# Patient Record
Sex: Female | Born: 1935 | Race: White | Hispanic: No | Smoking: Never smoker
Health system: Southern US, Community
[De-identification: ages and names within clinical notes are randomized; demographics above are authoritative.]

## PROBLEM LIST (undated history)

## (undated) DIAGNOSIS — IMO0002 Reserved for concepts with insufficient information to code with codable children: Secondary | ICD-10-CM

## (undated) DIAGNOSIS — I671 Cerebral aneurysm, nonruptured: Secondary | ICD-10-CM

## (undated) DIAGNOSIS — J189 Pneumonia, unspecified organism: Secondary | ICD-10-CM

## (undated) DIAGNOSIS — Z8614 Personal history of Methicillin resistant Staphylococcus aureus infection: Secondary | ICD-10-CM

## (undated) DIAGNOSIS — I639 Cerebral infarction, unspecified: Secondary | ICD-10-CM

## (undated) DIAGNOSIS — E785 Hyperlipidemia, unspecified: Secondary | ICD-10-CM

## (undated) DIAGNOSIS — C7911 Secondary malignant neoplasm of bladder: Secondary | ICD-10-CM

## (undated) DIAGNOSIS — I10 Essential (primary) hypertension: Secondary | ICD-10-CM

## (undated) DIAGNOSIS — C50919 Malignant neoplasm of unspecified site of unspecified female breast: Secondary | ICD-10-CM

## (undated) DIAGNOSIS — R339 Retention of urine, unspecified: Secondary | ICD-10-CM

## (undated) DIAGNOSIS — R51 Headache: Secondary | ICD-10-CM

## (undated) DIAGNOSIS — T8859XA Other complications of anesthesia, initial encounter: Secondary | ICD-10-CM

## (undated) DIAGNOSIS — C679 Malignant neoplasm of bladder, unspecified: Secondary | ICD-10-CM

## (undated) DIAGNOSIS — R519 Headache, unspecified: Secondary | ICD-10-CM

## (undated) DIAGNOSIS — R351 Nocturia: Secondary | ICD-10-CM

## (undated) DIAGNOSIS — M5030 Other cervical disc degeneration, unspecified cervical region: Secondary | ICD-10-CM

## (undated) DIAGNOSIS — M1990 Unspecified osteoarthritis, unspecified site: Secondary | ICD-10-CM

## (undated) DIAGNOSIS — N949 Unspecified condition associated with female genital organs and menstrual cycle: Secondary | ICD-10-CM

## (undated) DIAGNOSIS — K219 Gastro-esophageal reflux disease without esophagitis: Secondary | ICD-10-CM

## (undated) DIAGNOSIS — E039 Hypothyroidism, unspecified: Secondary | ICD-10-CM

## (undated) DIAGNOSIS — M4800 Spinal stenosis, site unspecified: Secondary | ICD-10-CM

## (undated) DIAGNOSIS — E079 Disorder of thyroid, unspecified: Secondary | ICD-10-CM

## (undated) DIAGNOSIS — Z8719 Personal history of other diseases of the digestive system: Secondary | ICD-10-CM

## (undated) DIAGNOSIS — N189 Chronic kidney disease, unspecified: Secondary | ICD-10-CM

## (undated) DIAGNOSIS — N3941 Urge incontinence: Secondary | ICD-10-CM

## (undated) DIAGNOSIS — Z8673 Personal history of transient ischemic attack (TIA), and cerebral infarction without residual deficits: Secondary | ICD-10-CM

## (undated) DIAGNOSIS — T4145XA Adverse effect of unspecified anesthetic, initial encounter: Secondary | ICD-10-CM

## (undated) DIAGNOSIS — I679 Cerebrovascular disease, unspecified: Secondary | ICD-10-CM

## (undated) DIAGNOSIS — A4902 Methicillin resistant Staphylococcus aureus infection, unspecified site: Secondary | ICD-10-CM

## (undated) HISTORY — DX: Unspecified condition associated with female genital organs and menstrual cycle: N94.9

## (undated) HISTORY — DX: Cerebrovascular disease, unspecified: I67.9

## (undated) HISTORY — PX: FRACTURE SURGERY: SHX138

## (undated) HISTORY — PX: ABDOMINAL HYSTERECTOMY: SHX81

## (undated) HISTORY — PX: CHOLECYSTECTOMY: SHX55

## (undated) HISTORY — DX: Hyperlipidemia, unspecified: E78.5

## (undated) HISTORY — DX: Methicillin resistant Staphylococcus aureus infection, unspecified site: A49.02

## (undated) HISTORY — PX: LAPAROSCOPIC HYSTERECTOMY: SHX1926

## (undated) HISTORY — DX: Nocturia: R35.1

## (undated) HISTORY — DX: Spinal stenosis, site unspecified: M48.00

## (undated) HISTORY — DX: Cerebral aneurysm, nonruptured: I67.1

## (undated) HISTORY — DX: Personal history of Methicillin resistant Staphylococcus aureus infection: Z86.14

## (undated) HISTORY — DX: Unspecified osteoarthritis, unspecified site: M19.90

## (undated) HISTORY — DX: Secondary malignant neoplasm of bladder: C79.11

## (undated) HISTORY — PX: CATARACT EXTRACTION: SUR2

## (undated) HISTORY — PX: BLADDER SURGERY: SHX569

## (undated) HISTORY — PX: TONSILLECTOMY: SUR1361

## (undated) HISTORY — DX: Disorder of thyroid, unspecified: E07.9

## (undated) HISTORY — DX: Retention of urine, unspecified: R33.9

## (undated) HISTORY — PX: BACK SURGERY: SHX140

## (undated) HISTORY — PX: EYE SURGERY: SHX253

## (undated) HISTORY — DX: Urge incontinence: N39.41

## (undated) HISTORY — DX: Essential (primary) hypertension: I10

## (undated) HISTORY — DX: Cerebral infarction, unspecified: I63.9

## (undated) HISTORY — DX: Gastro-esophageal reflux disease without esophagitis: K21.9

## (undated) HISTORY — DX: Malignant neoplasm of bladder, unspecified: C67.9

## (undated) HISTORY — DX: Other cervical disc degeneration, unspecified cervical region: M50.30

---

## 1997-11-22 ENCOUNTER — Ambulatory Visit (HOSPITAL_COMMUNITY): Admission: RE | Admit: 1997-11-22 | Discharge: 1997-11-22 | Payer: Self-pay | Admitting: Gastroenterology

## 2000-04-06 ENCOUNTER — Ambulatory Visit (HOSPITAL_COMMUNITY): Admission: RE | Admit: 2000-04-06 | Discharge: 2000-04-06 | Payer: Self-pay | Admitting: Gastroenterology

## 2004-01-28 ENCOUNTER — Inpatient Hospital Stay (HOSPITAL_COMMUNITY): Admission: RE | Admit: 2004-01-28 | Discharge: 2004-01-29 | Payer: Self-pay | Admitting: Neurosurgery

## 2004-04-02 ENCOUNTER — Ambulatory Visit (HOSPITAL_COMMUNITY): Admission: RE | Admit: 2004-04-02 | Discharge: 2004-04-02 | Payer: Self-pay | Admitting: Gastroenterology

## 2004-04-02 ENCOUNTER — Encounter (INDEPENDENT_AMBULATORY_CARE_PROVIDER_SITE_OTHER): Payer: Self-pay | Admitting: *Deleted

## 2004-06-23 ENCOUNTER — Ambulatory Visit: Payer: Self-pay | Admitting: Pain Medicine

## 2005-02-23 ENCOUNTER — Ambulatory Visit: Payer: Self-pay | Admitting: Pain Medicine

## 2005-02-24 ENCOUNTER — Ambulatory Visit: Payer: Self-pay | Admitting: Pain Medicine

## 2005-04-16 ENCOUNTER — Ambulatory Visit: Payer: Self-pay | Admitting: Pain Medicine

## 2005-04-27 ENCOUNTER — Ambulatory Visit: Payer: Self-pay | Admitting: Pain Medicine

## 2005-05-05 ENCOUNTER — Ambulatory Visit: Payer: Self-pay | Admitting: Pain Medicine

## 2005-05-31 DIAGNOSIS — IMO0001 Reserved for inherently not codable concepts without codable children: Secondary | ICD-10-CM

## 2005-05-31 DIAGNOSIS — C50919 Malignant neoplasm of unspecified site of unspecified female breast: Secondary | ICD-10-CM

## 2005-05-31 HISTORY — DX: Malignant neoplasm of unspecified site of unspecified female breast: C50.919

## 2005-05-31 HISTORY — PX: BREAST LUMPECTOMY: SHX2

## 2005-05-31 HISTORY — DX: Reserved for inherently not codable concepts without codable children: IMO0001

## 2005-06-03 ENCOUNTER — Ambulatory Visit: Payer: Self-pay

## 2005-06-24 ENCOUNTER — Ambulatory Visit: Payer: Self-pay | Admitting: Pain Medicine

## 2005-07-07 ENCOUNTER — Ambulatory Visit: Payer: Self-pay | Admitting: Pain Medicine

## 2005-08-03 ENCOUNTER — Ambulatory Visit: Payer: Self-pay | Admitting: Pain Medicine

## 2005-08-11 ENCOUNTER — Ambulatory Visit: Payer: Self-pay | Admitting: Pain Medicine

## 2005-09-16 ENCOUNTER — Ambulatory Visit: Payer: Self-pay | Admitting: Pain Medicine

## 2005-09-29 ENCOUNTER — Ambulatory Visit: Payer: Self-pay | Admitting: Pain Medicine

## 2005-10-15 ENCOUNTER — Other Ambulatory Visit: Payer: Self-pay

## 2005-10-15 ENCOUNTER — Ambulatory Visit: Payer: Self-pay | Admitting: Unknown Physician Specialty

## 2005-10-21 ENCOUNTER — Ambulatory Visit: Payer: Self-pay | Admitting: Unknown Physician Specialty

## 2005-12-08 ENCOUNTER — Ambulatory Visit: Payer: Self-pay | Admitting: Pain Medicine

## 2006-01-05 ENCOUNTER — Ambulatory Visit: Payer: Self-pay | Admitting: Pain Medicine

## 2006-01-17 ENCOUNTER — Ambulatory Visit: Payer: Self-pay | Admitting: Pain Medicine

## 2006-02-10 ENCOUNTER — Ambulatory Visit: Payer: Self-pay | Admitting: Family Medicine

## 2006-02-18 ENCOUNTER — Ambulatory Visit: Payer: Self-pay | Admitting: Family Medicine

## 2006-02-24 ENCOUNTER — Ambulatory Visit: Payer: Self-pay | Admitting: Unknown Physician Specialty

## 2006-03-24 ENCOUNTER — Ambulatory Visit: Payer: Self-pay | Admitting: General Surgery

## 2006-03-30 ENCOUNTER — Ambulatory Visit: Payer: Self-pay | Admitting: General Surgery

## 2006-04-05 ENCOUNTER — Ambulatory Visit: Payer: Self-pay | Admitting: Pain Medicine

## 2006-04-11 ENCOUNTER — Ambulatory Visit: Payer: Self-pay | Admitting: Radiation Oncology

## 2006-04-25 ENCOUNTER — Inpatient Hospital Stay: Payer: Self-pay | Admitting: General Surgery

## 2006-05-10 ENCOUNTER — Ambulatory Visit: Payer: Self-pay | Admitting: Radiation Oncology

## 2006-05-19 ENCOUNTER — Ambulatory Visit: Payer: Self-pay | Admitting: Pain Medicine

## 2006-05-31 ENCOUNTER — Ambulatory Visit: Payer: Self-pay | Admitting: Radiation Oncology

## 2006-05-31 DIAGNOSIS — A4902 Methicillin resistant Staphylococcus aureus infection, unspecified site: Secondary | ICD-10-CM

## 2006-05-31 DIAGNOSIS — I679 Cerebrovascular disease, unspecified: Secondary | ICD-10-CM

## 2006-05-31 HISTORY — PX: BRAIN SURGERY: SHX531

## 2006-05-31 HISTORY — DX: Methicillin resistant Staphylococcus aureus infection, unspecified site: A49.02

## 2006-05-31 HISTORY — DX: Cerebrovascular disease, unspecified: I67.9

## 2006-06-08 ENCOUNTER — Ambulatory Visit: Payer: Self-pay | Admitting: Pain Medicine

## 2006-07-01 ENCOUNTER — Ambulatory Visit: Payer: Self-pay | Admitting: Radiation Oncology

## 2006-07-12 ENCOUNTER — Ambulatory Visit: Payer: Self-pay | Admitting: Pain Medicine

## 2006-07-20 ENCOUNTER — Ambulatory Visit: Payer: Self-pay | Admitting: Pain Medicine

## 2006-07-30 ENCOUNTER — Ambulatory Visit: Payer: Self-pay | Admitting: Radiation Oncology

## 2006-08-17 ENCOUNTER — Ambulatory Visit: Payer: Self-pay | Admitting: Radiation Oncology

## 2006-08-30 ENCOUNTER — Ambulatory Visit: Payer: Self-pay | Admitting: Radiation Oncology

## 2006-09-06 ENCOUNTER — Ambulatory Visit: Payer: Self-pay | Admitting: Pain Medicine

## 2006-09-14 ENCOUNTER — Ambulatory Visit: Payer: Self-pay | Admitting: Pain Medicine

## 2006-10-04 ENCOUNTER — Ambulatory Visit: Payer: Self-pay | Admitting: Pain Medicine

## 2006-10-10 ENCOUNTER — Ambulatory Visit: Payer: Self-pay | Admitting: Pain Medicine

## 2006-10-31 ENCOUNTER — Ambulatory Visit: Payer: Self-pay | Admitting: General Surgery

## 2006-11-01 ENCOUNTER — Ambulatory Visit: Payer: Self-pay | Admitting: Pain Medicine

## 2006-12-08 ENCOUNTER — Ambulatory Visit: Payer: Self-pay | Admitting: Pain Medicine

## 2006-12-10 ENCOUNTER — Ambulatory Visit: Payer: Self-pay | Admitting: Specialist

## 2006-12-20 ENCOUNTER — Encounter: Payer: Self-pay | Admitting: Specialist

## 2006-12-21 ENCOUNTER — Ambulatory Visit: Payer: Self-pay | Admitting: Pain Medicine

## 2006-12-30 ENCOUNTER — Ambulatory Visit: Payer: Self-pay | Admitting: Radiation Oncology

## 2007-01-23 ENCOUNTER — Ambulatory Visit: Payer: Self-pay | Admitting: Radiation Oncology

## 2007-01-24 ENCOUNTER — Ambulatory Visit: Payer: Self-pay | Admitting: Pain Medicine

## 2007-01-30 ENCOUNTER — Ambulatory Visit: Payer: Self-pay | Admitting: Internal Medicine

## 2007-02-02 ENCOUNTER — Inpatient Hospital Stay (HOSPITAL_COMMUNITY): Admission: RE | Admit: 2007-02-02 | Discharge: 2007-02-03 | Payer: Self-pay | Admitting: Interventional Radiology

## 2007-02-16 ENCOUNTER — Encounter: Payer: Self-pay | Admitting: Interventional Radiology

## 2007-02-24 ENCOUNTER — Ambulatory Visit: Payer: Self-pay | Admitting: Internal Medicine

## 2007-03-13 ENCOUNTER — Ambulatory Visit: Payer: Self-pay | Admitting: Pain Medicine

## 2007-04-10 ENCOUNTER — Ambulatory Visit: Payer: Self-pay | Admitting: Internal Medicine

## 2007-04-11 ENCOUNTER — Ambulatory Visit: Payer: Self-pay | Admitting: Pain Medicine

## 2007-04-12 ENCOUNTER — Ambulatory Visit: Payer: Self-pay | Admitting: Internal Medicine

## 2007-04-12 ENCOUNTER — Other Ambulatory Visit: Payer: Self-pay

## 2007-04-13 ENCOUNTER — Ambulatory Visit: Payer: Self-pay | Admitting: Otolaryngology

## 2007-05-03 ENCOUNTER — Ambulatory Visit: Payer: Self-pay | Admitting: General Surgery

## 2007-06-07 ENCOUNTER — Ambulatory Visit (HOSPITAL_COMMUNITY): Admission: RE | Admit: 2007-06-07 | Discharge: 2007-06-07 | Payer: Self-pay | Admitting: Interventional Radiology

## 2007-06-26 ENCOUNTER — Ambulatory Visit: Payer: Self-pay | Admitting: Radiation Oncology

## 2007-08-09 ENCOUNTER — Encounter: Payer: Self-pay | Admitting: Interventional Radiology

## 2007-10-30 ENCOUNTER — Ambulatory Visit: Payer: Self-pay | Admitting: Radiation Oncology

## 2007-11-08 ENCOUNTER — Ambulatory Visit: Payer: Self-pay | Admitting: General Surgery

## 2007-12-19 ENCOUNTER — Ambulatory Visit (HOSPITAL_COMMUNITY): Admission: RE | Admit: 2007-12-19 | Discharge: 2007-12-19 | Payer: Self-pay | Admitting: Interventional Radiology

## 2008-01-11 ENCOUNTER — Ambulatory Visit: Payer: Self-pay | Admitting: Pain Medicine

## 2008-01-17 ENCOUNTER — Ambulatory Visit: Payer: Self-pay | Admitting: Pain Medicine

## 2008-02-08 ENCOUNTER — Ambulatory Visit: Payer: Self-pay | Admitting: Pain Medicine

## 2008-02-19 ENCOUNTER — Ambulatory Visit: Payer: Self-pay | Admitting: Pain Medicine

## 2008-03-11 ENCOUNTER — Ambulatory Visit: Payer: Self-pay | Admitting: Pain Medicine

## 2008-04-08 ENCOUNTER — Ambulatory Visit: Payer: Self-pay | Admitting: General Surgery

## 2008-04-09 ENCOUNTER — Ambulatory Visit: Payer: Self-pay | Admitting: Pain Medicine

## 2008-06-20 ENCOUNTER — Ambulatory Visit: Payer: Self-pay | Admitting: Pain Medicine

## 2008-06-24 ENCOUNTER — Ambulatory Visit: Payer: Self-pay | Admitting: Pain Medicine

## 2008-07-25 ENCOUNTER — Ambulatory Visit: Payer: Self-pay | Admitting: Pain Medicine

## 2008-08-22 ENCOUNTER — Ambulatory Visit: Payer: Self-pay | Admitting: Pain Medicine

## 2008-09-16 ENCOUNTER — Ambulatory Visit (HOSPITAL_COMMUNITY): Admission: RE | Admit: 2008-09-16 | Discharge: 2008-09-16 | Payer: Self-pay | Admitting: Interventional Radiology

## 2008-09-23 ENCOUNTER — Ambulatory Visit: Payer: Self-pay | Admitting: Pain Medicine

## 2008-09-25 ENCOUNTER — Ambulatory Visit (HOSPITAL_COMMUNITY): Admission: RE | Admit: 2008-09-25 | Discharge: 2008-09-25 | Payer: Self-pay | Admitting: Interventional Radiology

## 2008-10-22 ENCOUNTER — Ambulatory Visit: Payer: Self-pay | Admitting: Pain Medicine

## 2008-11-06 ENCOUNTER — Ambulatory Visit: Payer: Self-pay

## 2008-11-13 ENCOUNTER — Ambulatory Visit: Payer: Self-pay | Admitting: General Surgery

## 2008-11-19 ENCOUNTER — Ambulatory Visit: Payer: Self-pay | Admitting: Pain Medicine

## 2008-12-17 ENCOUNTER — Ambulatory Visit: Payer: Self-pay | Admitting: Pain Medicine

## 2009-01-09 ENCOUNTER — Ambulatory Visit: Payer: Self-pay | Admitting: Internal Medicine

## 2009-01-15 ENCOUNTER — Ambulatory Visit: Payer: Self-pay | Admitting: Internal Medicine

## 2009-02-10 ENCOUNTER — Ambulatory Visit: Payer: Self-pay | Admitting: Pain Medicine

## 2009-02-19 ENCOUNTER — Ambulatory Visit: Payer: Self-pay | Admitting: Pain Medicine

## 2009-03-06 ENCOUNTER — Ambulatory Visit: Payer: Self-pay | Admitting: Pain Medicine

## 2009-04-08 ENCOUNTER — Ambulatory Visit: Payer: Self-pay | Admitting: Pain Medicine

## 2009-04-15 ENCOUNTER — Ambulatory Visit: Payer: Self-pay | Admitting: Internal Medicine

## 2009-05-08 ENCOUNTER — Ambulatory Visit: Payer: Self-pay | Admitting: Pain Medicine

## 2009-06-05 ENCOUNTER — Ambulatory Visit: Payer: Self-pay | Admitting: Pain Medicine

## 2009-07-03 ENCOUNTER — Ambulatory Visit: Payer: Self-pay | Admitting: Pain Medicine

## 2009-08-05 ENCOUNTER — Ambulatory Visit: Payer: Self-pay | Admitting: Pain Medicine

## 2009-08-20 ENCOUNTER — Ambulatory Visit: Payer: Self-pay | Admitting: Ophthalmology

## 2009-10-09 ENCOUNTER — Ambulatory Visit: Payer: Self-pay | Admitting: Pain Medicine

## 2009-10-22 ENCOUNTER — Ambulatory Visit: Payer: Self-pay | Admitting: Urology

## 2009-10-29 DIAGNOSIS — C679 Malignant neoplasm of bladder, unspecified: Secondary | ICD-10-CM | POA: Insufficient documentation

## 2009-11-05 ENCOUNTER — Ambulatory Visit: Payer: Self-pay | Admitting: Urology

## 2009-11-17 ENCOUNTER — Ambulatory Visit: Payer: Self-pay | Admitting: Pain Medicine

## 2009-12-10 ENCOUNTER — Ambulatory Visit: Payer: Self-pay | Admitting: Pain Medicine

## 2010-01-12 ENCOUNTER — Ambulatory Visit: Payer: Self-pay | Admitting: General Surgery

## 2010-01-15 ENCOUNTER — Ambulatory Visit: Payer: Self-pay | Admitting: Pain Medicine

## 2010-02-12 ENCOUNTER — Ambulatory Visit: Payer: Self-pay | Admitting: Pain Medicine

## 2010-02-18 ENCOUNTER — Ambulatory Visit: Payer: Self-pay | Admitting: Pain Medicine

## 2010-03-17 ENCOUNTER — Ambulatory Visit: Payer: Self-pay | Admitting: Pain Medicine

## 2010-04-16 ENCOUNTER — Ambulatory Visit: Payer: Self-pay | Admitting: Pain Medicine

## 2010-05-12 ENCOUNTER — Ambulatory Visit: Payer: Self-pay | Admitting: Pain Medicine

## 2010-06-11 ENCOUNTER — Ambulatory Visit: Payer: Self-pay | Admitting: Pain Medicine

## 2010-06-29 ENCOUNTER — Ambulatory Visit: Payer: Self-pay | Admitting: Internal Medicine

## 2010-07-08 ENCOUNTER — Other Ambulatory Visit (HOSPITAL_COMMUNITY): Payer: Self-pay | Admitting: Interventional Radiology

## 2010-07-08 DIAGNOSIS — I729 Aneurysm of unspecified site: Secondary | ICD-10-CM

## 2010-07-14 ENCOUNTER — Ambulatory Visit: Payer: Self-pay | Admitting: Pain Medicine

## 2010-07-17 ENCOUNTER — Other Ambulatory Visit (HOSPITAL_COMMUNITY): Payer: Self-pay | Admitting: Interventional Radiology

## 2010-07-17 ENCOUNTER — Ambulatory Visit (HOSPITAL_COMMUNITY)
Admission: RE | Admit: 2010-07-17 | Discharge: 2010-07-17 | Disposition: A | Discharge status: Home / Self Care | Payer: PRIVATE HEALTH INSURANCE | Source: Ambulatory Visit | Attending: Interventional Radiology | Admitting: Interventional Radiology

## 2010-07-17 DIAGNOSIS — Z8614 Personal history of Methicillin resistant Staphylococcus aureus infection: Secondary | ICD-10-CM | POA: Insufficient documentation

## 2010-07-17 DIAGNOSIS — Z01812 Encounter for preprocedural laboratory examination: Secondary | ICD-10-CM | POA: Insufficient documentation

## 2010-07-17 DIAGNOSIS — Z8673 Personal history of transient ischemic attack (TIA), and cerebral infarction without residual deficits: Secondary | ICD-10-CM | POA: Insufficient documentation

## 2010-07-17 DIAGNOSIS — K219 Gastro-esophageal reflux disease without esophagitis: Secondary | ICD-10-CM | POA: Insufficient documentation

## 2010-07-17 DIAGNOSIS — I729 Aneurysm of unspecified site: Secondary | ICD-10-CM

## 2010-07-17 DIAGNOSIS — Z853 Personal history of malignant neoplasm of breast: Secondary | ICD-10-CM | POA: Insufficient documentation

## 2010-07-17 DIAGNOSIS — R51 Headache: Secondary | ICD-10-CM | POA: Insufficient documentation

## 2010-07-17 DIAGNOSIS — I672 Cerebral atherosclerosis: Secondary | ICD-10-CM | POA: Insufficient documentation

## 2010-07-17 LAB — POCT I-STAT, CHEM 8
BUN: 11 mg/dL (ref 6–23)
Calcium, Ion: 1.03 mmol/L — ABNORMAL LOW (ref 1.12–1.32)
Chloride: 104 mEq/L (ref 96–112)
Creatinine, Ser: 0.9 mg/dL (ref 0.4–1.2)
Glucose, Bld: 106 mg/dL — ABNORMAL HIGH (ref 70–99)
HCT: 33 % — ABNORMAL LOW (ref 36.0–46.0)
Hemoglobin: 11.2 g/dL — ABNORMAL LOW (ref 12.0–15.0)
Potassium: 4 mEq/L (ref 3.5–5.1)
Sodium: 136 mEq/L (ref 135–145)
TCO2: 23 mmol/L (ref 0–100)

## 2010-07-17 LAB — CBC
HCT: 33.4 % — ABNORMAL LOW (ref 36.0–46.0)
Hemoglobin: 11.2 g/dL — ABNORMAL LOW (ref 12.0–15.0)
MCH: 30.9 pg (ref 26.0–34.0)
MCHC: 33.5 g/dL (ref 30.0–36.0)
MCV: 92.3 fL (ref 78.0–100.0)
Platelets: 157 10*3/uL (ref 150–400)
RBC: 3.62 MIL/uL — ABNORMAL LOW (ref 3.87–5.11)
RDW: 12.8 % (ref 11.5–15.5)
WBC: 3.9 10*3/uL — ABNORMAL LOW (ref 4.0–10.5)

## 2010-07-17 LAB — PROTIME-INR
INR: 0.96 (ref 0.00–1.49)
Prothrombin Time: 13 seconds (ref 11.6–15.2)

## 2010-07-17 LAB — APTT: aPTT: 27 seconds (ref 24–37)

## 2010-07-17 MED ORDER — IOHEXOL 300 MG/ML  SOLN
150.0000 mL | Freq: Once | INTRAMUSCULAR | Status: AC | PRN
  Administered 2010-07-17: 80 mL via INTRA_ARTERIAL

## 2010-08-11 ENCOUNTER — Ambulatory Visit: Payer: Self-pay | Admitting: Pain Medicine

## 2010-09-03 ENCOUNTER — Ambulatory Visit: Payer: Self-pay | Admitting: Pain Medicine

## 2010-09-08 ENCOUNTER — Ambulatory Visit: Payer: Self-pay | Admitting: Ophthalmology

## 2010-09-09 LAB — BASIC METABOLIC PANEL
BUN: 12 mg/dL (ref 6–23)
CO2: 27 mEq/L (ref 19–32)
Calcium: 9.6 mg/dL (ref 8.4–10.5)
Chloride: 106 mEq/L (ref 96–112)
Creatinine, Ser: 0.79 mg/dL (ref 0.4–1.2)
GFR calc Af Amer: >60 mL/min (ref >60)
GFR calc non Af Amer: >60 mL/min (ref >60)
Glucose, Bld: 97 mg/dL (ref 70–99)
Potassium: 4.1 mEq/L (ref 3.5–5.1)
Sodium: 138 mEq/L (ref 135–145)

## 2010-09-09 LAB — PROTIME-INR
INR: 1 (ref 0.00–1.49)
Prothrombin Time: 13 seconds (ref 11.6–15.2)

## 2010-09-09 LAB — APTT: aPTT: 28 seconds (ref 24–37)

## 2010-09-09 LAB — CBC
HCT: 38.8 % (ref 36.0–46.0)
Hemoglobin: 13.2 g/dL (ref 12.0–15.0)
MCHC: 34 g/dL (ref 30.0–36.0)
MCV: 95.2 fL (ref 78.0–100.0)
Platelets: 184 10*3/uL (ref 150–400)
RBC: 4.08 MIL/uL (ref 3.87–5.11)
RDW: 14.2 % (ref 11.5–15.5)
WBC: 5.2 10*3/uL (ref 4.0–10.5)

## 2010-09-29 ENCOUNTER — Ambulatory Visit: Payer: Self-pay | Admitting: General Surgery

## 2010-10-08 ENCOUNTER — Ambulatory Visit: Payer: Self-pay | Admitting: Pain Medicine

## 2010-10-13 NOTE — Consult Note (Signed)
Jodi Colon, Jodi Colon                 ACCOUNT NO.:  [id number]   MEDICAL RECORD NO.:  [id number]          PATIENT TYPE:  OUT   LOCATION:  XRAY                         FACILITY:  [facility]   PHYSICIAN:  Sanjeev K. Deveshwar, M.D.DATE OF BIRTH:  1930/04/13   DATE OF CONSULTATION:  08/09/2007  DATE OF DISCHARGE:                                 CONSULTATION   DATE OF CONSULT:  August 09, 2007.   CHIEF COMPLAINT:  Headaches.   HISTORY OF PRESENT ILLNESS:  This is a very pleasant 77 year old female  who was initially referred to Dr. Corliss Skains through the courtesy of Dr.  Santiago Glad back in September 2008.  Dr. Neale Burly had seen the  patient for evaluation of headaches.  He had ordered an MRI, MRA that  was performed December 10, 2006, that showed a possible high-grade stenosis  of the right middle cerebral artery.  Dr. Corliss Skains also questioned the  presence of a possible right internal carotid artery aneurysm on the  study.  The patient went on to have a cerebral angiogram and eventually  had a PTA stent of the right middle cerebral artery performed on  February 02, 2004.  She has been followed since that time and has done  well.  She has remained on aspirin and Plavix per Dr. Fatima Sanger  instructions.  She had a cerebral angiogram repeated on June 07, 2007, that revealed a 50-60% stenosis within the stent in the right  middle cerebral artery.  This is currently being followed.  Dr.  Corliss Skains did not feel that it was significant enough for further  treatment at this time.  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  This aneurysm appears stable and is also being  followed.   The patient recently requested a follow-up evaluation for headache pain.  She presents today with her granddaughter for that consult.   PAST MEDICAL HISTORY:  Significant for  1. The above-noted cerebrovascular disease.  2. The  patient has a history of hyperlipidemia.  3. Gastroesophageal reflux disease.  4. History of headaches.  5. Previous breast cancer.  She is status post lumpectomy, I  believe      in October 2007, with subsequent radiation therapy.  6. She has a history of small CVAs on her MRI.   PAST SURGICAL HISTORY:  1. She had a cholecystectomy.  2. Hysterectomy.  3. Hand surgery.  4. Right knee surgery.  5. Back surgery.  6. Left ankle surgery.  7. Breast lumpectomy.  8. She had an MRSA infection of her thigh which required surgery in      the past.   ALLERGIES:  She is allergic to SULFA which causes a rash.  She is  allergic to PENICILLIN which causes a rash.  She denies allergies to  contrast dye or to latex.   CURRENT MEDICATIONS:  The patient did not have a complete list her  medications although she does continue on aspirin and Plavix.  She has  been taking Tylenol for  her headache pain.  I believe she is also on an  antidepressant.  She takes Ambien for sleep.   SOCIAL HISTORY:  The patient is widowed.  She has three children.  [place]  [place]  She is retired from Research officer, political party.  She uses alcohol rarely.  She has never smoked.  She has recently been  under some stress due to the current financial situation.   FAMILY HISTORY:  The patient's mother died at age 53, she had rheumatoid  arthritis, Parkinson's disease and history of CVAs.  Her father died at  age 20 from a massive MI.   IMPRESSION AND PLAN:  As noted, the patient presents today for further  evaluation of headaches.  She had initially seen Dr. Santiago Glad  back in I believe September 2008, at which time she was diagnosed with  having a right middle cerebral artery stenosis.  The patient states that  her headaches improved after the stent was placed, however,  approximately four weeks ago the headaches returned.  She reports that  they have been constant since that time, although the  intensity waxes  and wanes.  The quality of the headaches also changes.  Sometimes it is  a sharp pain.  Sometimes it is a dull pulsating pain or a dull ache.  She reports the headaches are mainly on the right side of her face,  occasionally her right eye twitches.  The pain radiates into her neck.  At times the discomfort is quite severe.  She has been taking Tylenol  p.r.n. for the pain.   The patient also reports that she occasionally has word-finding  difficulties.  These episodes occasionally last all day.  She has also  been feeling lethargic and possibly depressed although, as noted, she is  on an antidepressant.   Dr. Corliss Skains reviewed the results of her recent cerebral angiogram with  the patient and her granddaughter.  He showed them the images and  pointed out the area of stenosis.  Again, he did not feel that this  stenosis was significant at this time, although further monitoring was  recommended.  He did not feel that the stent or the stenosis was  contributing or associated with the patient's headaches.  We discussed  other etiologies for headache such as trigeminal neuralgia as well as  temporal arteritis.  Dr. Corliss Skains recommended that the patient follow  up again with Dr. Santiago Glad who initially evaluated her headaches  and who initially discovered her cerebrovascular disease.   We did give the patient a prescription for Vicodin 500/5, #30 to be  dispensed to be taken one q.4-6h., no refills.  Further medication  recommendations can be made at the discretion of Dr. Santiago Glad.   Greater than 40 minutes was spent on this follow-up consult.      Delton See, P.A.    ______________________________  Grandville Silos. Corliss Skains, M.D.    DR/MEDQ  D:  08/09/2007  T:  08/10/2007  Job:  [id number]   cc:   Philmore Pali, M.D.  Ewing Schlein

## 2010-10-13 NOTE — Consult Note (Signed)
Jodi Colon, Jodi Colon                 ACCOUNT NO.:  [id number]   MEDICAL RECORD NO.:  [id number]          PATIENT TYPE:  OUT   LOCATION:  XRAY                         FACILITY:  [facility]   PHYSICIAN:  Sanjeev K. Deveshwar, M.D.DATE OF BIRTH:  01-Aug-1929   DATE OF CONSULTATION:  DATE OF DISCHARGE:                                 CONSULTATION   DATE OF CONSULT:  December 20, 2006   CHIEF COMPLAINT:  Cerebrovascular disease.   HISTORY OF PRESENT ILLNESS:  This is a 77 year old female referred to  Dr. Corliss Skains through the courtesy of Dr. Santiago Glad.  The patient  recently had an MRI/MRA performed 12/10/2006 to evaluate a history of  headaches which have been present since Mother's Day of this year.  The  MRI showed old lacunar infarcts as well as a question of a high-grade  stenosis of the right middle cerebral artery.  A cerebral angiogram was  recommended.  The patient was referred to Dr. Corliss Skains.  She presents  today accompanied by her daughter to discuss further evaluation.   PAST MEDICAL HISTORY:  Significant for hyperlipidemia, gastroesophageal  reflux disease.  She has a history of breast cancer.  She had a  lumpectomy last October as well as radiation therapy.  She has had small  CVAs by her MRI.   SURGICAL HISTORY:  The patient is status post cholecystectomy, status  post hysterectomy, status post hand surgery.  She had right knee surgery  last year.  She has had back surgery previously by Dr. Jordan Likes.  She had  left ankle surgery x2 in 2006.  She had a breast lumpectomy in 2007.  She has a history of MRSA which required surgery on her thigh.  She  reports difficulty waking up after anesthesia at times.   ALLERGIES:  The patient is allergic to SULFA which caused a rash.  She  is allergic to PENICILLIN which caused a rash.  She denies allergies to  contrast or latex, shrimp, iodine or shellfish.   CURRENT MEDICATIONS:  1. Aspirin 81 mg daily  2. Nasacort nasal spray  3.  Wellbutrin  4. Celebrex  5. Prevacid  6. Allegra p.r.n.  7. Oxybutynin  8. Ambien  9. Paroxetine  10.Tizanidine  11.Vicodin p.r.n.  12.Multivitamin  13.Neurontin.   SOCIAL HISTORY:  The patient is widowed.  She has three children.  She  lives in [place].  She is a retired Scientist, research (physical sciences).  She uses  alcohol rarely.  She has never smoked.   FAMILY HISTORY:  Mother died at age 44.  She had rheumatoid arthritis,  Parkinson's disease and history of CVAs.  Her father died at age 13 from  massive MI.   IMPRESSION AND PLAN:  As noted the patient presents today accompanied by  her daughter for further evaluation of an abnormal MRI, MRA performed  12/10/2006.  Dr. Corliss Skains reviewed the images with the patient and her  daughter.  She was noted to have a high-grade right middle cerebral  artery stenosis by MRA.  She has had previous small CVAs by MRI.  She  was  also noted to have a possible 3-mm aneurysm of the right internal  carotid artery on the MRA as well.   Further evaluation as well as treatment options were discussed for both  the high-grade stenosis and for the cerebral aneurysm.  The cerebral  angiogram was explained in detail along with the risks and benefits.  Treatment options for the aneurysm were explained including continued  monitoring, open craniotomy and clipping or possible endovascular  coiling and/or stenting.  PTA stenting was explained for the stenosis.  All of the risks and benefits were also described.   Dr. Corliss Skains felt that it might be possible to perform the cerebral  angiogram, the stenting of the right middle cerebral artery and  treatment of the aneurysm all at one time.  Depending on the difficulty  of the intervention, the stenosis and the aneurysm might need to be  treated during 2 separate procedures.  He would not know for sure until  the day of the intervention.   The patient was given a prescription for Plavix which she will start 3   days prior to the procedure.  She has been tentatively scheduled for  sometime in late August. All of their questions were answered.   Greater than 40 minutes was spent on this consult.      Delton See, P.A.    ______________________________  Grandville Silos. Corliss Skains, M.D.    DR/MEDQ  D:  12/20/2006  T:  12/20/2006  Job:  [id number]   cc:   Jodi Colon

## 2010-10-13 NOTE — H&P (Signed)
Jodi Colon                 ACCOUNT NO.:  [id number]   MEDICAL RECORD NO.:  [id number]          PATIENT TYPE:  OIB   LOCATION:  3172                         FACILITY:  [facility]   PHYSICIAN:  Sanjeev K. Deveshwar, M.D.DATE OF BIRTH:  1929-06-14   DATE OF ADMISSION:  02/02/2007  DATE OF DISCHARGE:                              HISTORY & PHYSICAL   CHIEF COMPLAINT:  Cerebrovascular disease.   HISTORY OF PRESENT ILLNESS:  This is a very pleasant 77 year old female  referred to Dr. Corliss Skains through the courtesy of Dr. Santiago Glad  after she had an abnormal MRI/MRA performed December 10, 2006 to evaluate  headaches.  The MRI showed old lacunar infarcts as well as a possible  high-grade stenosis of the right middle cerebral artery.  On review of  the images, Dr. Corliss Skains also noted a possible 3-mm aneurysm of the  right internal carotid artery.  The patient was seen in consultation on  December 20, 2006.  PTA stenting of the stenosis was described in detail, as  well as possible coiling of the aneurysm.  The risks and benefits were  discussed.  The patient agreed to further evaluation and possible  intervention.  She was admitted to [facility] on February 02, 2007 for the cerebral angiogram and possible PTA stenting and/or  coiling.   PAST MEDICAL HISTORY:  1. Hyperlipidemia.  2. Gastroesophageal reflux disease.  3. History of headaches.  4. History of breast cancer; she had a lumpectomy last October as well      as radiation therapy.  5. She has had small CVAs by her MRI.   SURGICAL HISTORY:  The patient is status post cholecystectomy,  hysterectomy, hand surgery, right knee surgery, back surgery performed  by Dr. Jordan Likes, left ankle surgery x2 in 2006, breast lumpectomy in 2007.  The patient reports that she had MRSA which required surgery to her  thigh in the past.  She reports difficulty waking up after anesthesia at  times.   ALLERGIES:  The patient is allergic to  SULFA, which causes a rash.  She  is allergic to PENICILLIN, which causes a rash.  She denies allergies to  contrast dye, Latex, shrimp, iodine or shellfish.   MEDICATIONS AT TIME OF ADMISSION:  Aspirin, Plavix, which was started in  anticipation of possible stenting, Prevacid, oxybutynin, Celebrex,  Allegra, Ambien, Paroxetine, tizanidine, Vicodin p.r.n., multivitamins,  Neurontin and Wellbutrin.   SOCIAL HISTORY:  The patient is widowed.  She has 3 children.  She lives  in [place].  She is retired from Research officer, political party.  She uses alcohol  rarely.  She has never smoked.   FAMILY HISTORY:  Her mother died at age 37; she had rheumatoid  arthritis, Parkinson's disease and a history of CVAs.  Her father died  at age 5 from a massive MI.   LABORATORY DATA:  An INR is 1.  PTT is 30.  Hemoglobin 12.9, hematocrit  38.1, WBC 6700, platelets 286,000.  BUN 8, creatinine 0.72.  Potassium  4, glucose 96, chloride 94, sodium 133.  GFR was greater than 60.  REVIEW OF SYSTEMS:  Completely negative, except for ongoing headaches,  arthritis and bursitis of her right hip.   PHYSICAL EXAMINATION:  GENERAL:  Physical exam revealed a pleasant 77-  year-old white female in no acute distress.  VITAL SIGNS:  Blood pressure 140/71, pulse 72, respirations 18,  temperature of 97.  Oxygen saturation is 97% on room air.  HEENT:  Unremarkable.  NECK:  Revealed a right carotid bruit.  HEART:  Regular rate and rhythm without murmur.  LUNGS:  Clear.  ABDOMEN:  Nontender.  EXTREMITIES:  Revealed pulses to be intact without edema.  Her airway  was rated at a 3.  Her ASA scale was rated at a III.  NEUROLOGICAL:  The patient was alert and oriented and follows commands.  Cranial nerves II-XII were grossly intact.  Sensation was intact to  light touch.  Motor strength was 4/5 to 5/5 throughout.  There were no  deficits noted.  Cerebellar testing was intact.   IMPRESSION:  1. Cerebrovascular disease with high-grade  stenosis of the right      middle cerebral artery by MRI and possible 3-mm aneurysm of the      right internal carotid artery by MRA.  2. Ongoing headaches.  3. Hyperlipidemia.  4. Gastroesophageal reflux disease.  5. History of breast cancer.  6. History of cerebrovascular accidents by MRI.  7. Status post multiple surgeries as noted above.  8. Previous history of methicillin-resistant Staphylococcus aureus      infection, requiring surgical intervention.  9. History of allergies to sulfa and penicillin, both of which caused      a rash.  10.History of depression.   PLAN:  As noted, the patient will have a cerebral angiogram performed  today by Dr. Corliss Skains under conscious sedation.  Based on these  findings, she may require PTA stenting of the high-grade stenosis of the  right middle cerebral artery and possible endovascular treatment of a  right internal carotid artery aneurysm.  These procedures have been  described to the patient and her family in detail, along the possible  risks and benefits including, but not limited to, infection, bleeding  and stroke.      Delton See, P.A.    ______________________________  Grandville Silos. Corliss Skains, M.D.    DR/MEDQ  D:  02/02/2007  T:  02/02/2007  Job:  [id number]   cc:   Janan Ridge

## 2010-10-13 NOTE — Consult Note (Signed)
Jodi Colon, Jodi Colon                 ACCOUNT NO.:  [id number]   MEDICAL RECORD NO.:  [id number]          PATIENT TYPE:  OUT   LOCATION:  XRAY                         FACILITY:  [facility]   PHYSICIAN:  Sanjeev K. Deveshwar, M.D.DATE OF BIRTH:  1930/02/22   DATE OF CONSULTATION:  02/16/2007  DATE OF DISCHARGE:                                 CONSULTATION   CHIEF COMPLAINT:  Status post PTA stent of the right middle cerebral  artery performed on February 02, 2004.   HISTORY OF PRESENT ILLNESS:  This is a very pleasant, 77 year old female  who was referred to Dr. Corliss Skains through the courtesy of Dr. Santiago Glad after the patient had an abnormal MRI/MRA performed December 10, 2006, to evaluate for headache symptoms.  The MRI showed old lacunar  infarcts as well as a possible high-grade stenosis of the right middle  cerebral artery.  Dr. Corliss Skains also questioned the presence of a  possible right internal carotid artery aneurysm.   The patient was seen in consultation by Dr. Corliss Skains on December 20, 2006.  PTA stenting of the stenosis was explained in detail as well as possible  coiling of the aneurysm.  The risks and benefits of the procedure were  discussed.  The patient agreed to proceed.  She was admitted to [facility] on [facility] 02, 2007, and had a cerebral angiogram  followed by PTA stenting of the right middle cerebral artery.  She was  also noted to have a 30-40% left middle cerebral artery stenosis.  She  was found to have a small 1.5 mm, right, internal carotid artery  aneurysm.  The patient returns today to be seen in followup.  She is  accompanied by her daughter.   PAST MEDICAL HISTORY:  1. Hyperlipidemia.  2. Gastroesophageal reflux disease.  3. History of headaches.  4. History of breast cancer.  5. Status post lumpectomy last October with subsequent radiation      therapy.  6. History of small CVAs by her MRI.   PAST SURGICAL HISTORY:  1. Cholecystectomy.  2. Hysterectomy.  3. Hand surgery.  4. Right knee surgery.  5. Back surgery performed by Dr. Jordan Likes.  6. Left ankle surgery x2.  7. Breast lumpectomy.  8. The patient reports that she also had MRSA in her thigh which      required surgery in the past.  She reports some difficulty waking      up following anesthesia.   ALLERGIES:  SULFA which causes a rash.  PENICILLIN also caused a rash.  She she denied allergies to contrast dye or latex.   MEDICATIONS:  Aspirin, Plavix, Prevacid, oxybutynin, Celebrex, Allegra,  Ambien, paroxetine, tizanidine, Vicodin, multivitamins, Neurontin and  Wellbutrin.   SOCIAL HISTORY:  The patient is widowed.  She has three children.  She  lives in [place].  She is retired from Research officer, political party.  She uses alcohol  rarely.  She has never smoked.   FAMILY HISTORY:  Mother died at age 20.  She had rheumatoid arthritis,  Parkinson's disease and history of CVAs.  Her father  died at age 57 from  a massive MI.   IMPRESSION/RECOMMENDATIONS:  As noted, the patient returns today to be  seen in followup.  She reports she is still having occasional headaches,  although they are not as severe as prior to the intervention.  She  describes the headaches as being in the right parietal area of her skull  as well as in the occipital area.  The patient reports that she often  has the headaches in the morning when she wakes up.  She reports having  difficulty waking and feeling foggy for several hours after getting up.  She does report that she takes Ambien as well as a muscle relaxant every  night to help her sleep.  It was felt that possibly some of her cloudy  sensorium might be due to these medications.  She reports that these  were prescribed by a physician for pain management.   Dr. Corliss Skains did review the preimages and postimages for the stenting  procedure.  He also pointed out the mild narrowing of the left middle  cerebral artery.  The plan at this time is to  repeat an angiogram in  approximately 3-4 months and then again in 1 year.  He did recommend  that she stay on aspirin and Plavix.  He felt she would need Plavix for  at least a year.  He recommended that she either change to an uncoated  81 mg aspirin or increase the dose to 160 mg or 325 mg, if she continues  on the coated aspirin.   The patient reported that she had a small knot in her right groin.  Dr.  Corliss Skains felt that this was due to scar tissue and would eventually  resolve.  We also recommended that the patient gradually increase her  activity and tried to walk for exercise on a regular basis if possible.  This might help with her sleep as well.   Greater than 30 minutes were spent on this consult.      Jodi Colon, P.A.    ______________________________  Grandville Silos. Corliss Skains, M.D.    DR/MEDQ  D:  02/16/2007  T:  02/17/2007  Job:  [id number]   cc:   Janan Ridge

## 2010-10-13 NOTE — Discharge Summary (Signed)
Jodi Colon, Jodi Colon                 ACCOUNT NO.:  [id number]   MEDICAL RECORD NO.:  [id number]          PATIENT TYPE:  OIB   LOCATION:  3108                         FACILITY:  [facility]   PHYSICIAN:  Sanjeev K. Deveshwar, M.D.DATE OF BIRTH:  02-29-1930   DATE OF ADMISSION:  02/02/2007  DATE OF DISCHARGE:  02/03/2007                               DISCHARGE SUMMARY   CHIEF COMPLAINT:  Cerebrovascular disease.   HISTORY OF PRESENT ILLNESS:  This is a very pleasant, 77 year old female  referred to Dr. Corliss Skains through the courtesy of Dr. Santiago Glad  after she had an abnormal MRI/MRA performed December 10, 2006, to evaluate  headache symptoms.  The MRI showed old lacunar infarcts as well as a  possible high-grade stenosis of the right middle cerebral artery.  On  review of the images, Dr. Corliss Skains also noted a possible 3 mm aneurysm  of the right internal carotid artery.   The patient was seen in consultation by Dr. Corliss Skains on December 20, 2006.  PTA stenting of the stenosis was explained in detail as was possible  coiling of the aneurysm.  The risks and benefits of the procedures were  also described.  The patient agreed to further evaluation and  intervention.  She was admitted to [facility] on February 02, 2007, to undergo cerebral angiogram and possible treatment.   PAST MEDICAL HISTORY:  1. Hyperlipidemia.  2. Gastroesophageal reflux disease.  3. History of headaches.  4. History of breast cancer.  She had a lumpectomy last October with      subsequent radiation therapy.  5. She has had small CVAs by her MRI.   PAST SURGICAL HISTORY:  1. Status post cholecystectomy.  2. Hysterectomy.  3. Hand surgery.  4. Right knee surgery.  5. Back surgery performed by Dr. Jordan Likes.  6. Left ankle surgery x2 in 2006.  7. Breast lumpectomy in 2007.  8. The patient reports that she had MRSA in her thigh which required      surgery in the past as well.  She reports some difficulty waking  up      after anesthesia.   ALLERGIES:  SULFA which causes a rash.  PENICILLIN also causes a rash.  She denied allergies to contrast dye, latex, shrimp, iodine or  shellfish.   MEDICATIONS ON ADMISSION:  Aspirin and Plavix which was started in  anticipation of possible stenting, Prevacid, Oxybutynin, Celebrex,  Allegra, Ambien, paroxetine, tizanidine, Vicodin, multivitamins,  Neurontin and Wellbutrin.   SOCIAL HISTORY:  The patient is widowed.  She has three children.  She  lives in [place].  She is retired from [employer], [employer].  She uses alcohol  rarely.  She has never smoked.   FAMILY HISTORY:  Her mother died at age 76.  She had rheumatoid  arthritis, Parkinson's disease and history of CVAs.  Her father died at  age 75 from a massive MI.   HOSPITAL COURSE:  As noted, this patient was admitted to [facility] on February 02, 2007, to undergo a cerebral angiogram to follow  up on an abnormal  MRI/MRA with plans for possible treatment of the above-  noted abnormalities.   The angiogram was performed which did confirm a high-grade stenosis of  the right middle cerebral artery.  She also had a small aneurysm of the  right internal carotid artery which appeared to be stable.   Following the angiogram, the results were discussed with the patient and  her family.  Once again, the risks were described which included, but  were not limited to, infection, bleeding and stroke.  A decision was  made to proceed with intervention and the patient underwent PTA stenting  of the right middle cerebral artery performed under general anesthesia  by Dr. Corliss Skains without any immediate or known complications.   Following the procedure, the patient was taken to the neurologic post  anesthesia care unit and eventually admitted to the neurologic  intensive care unit where she remained on IV heparin overnight per  protocol.  The patient also required intravenous Cardene for control of  her  blood pressure.   The following day, the heparin was discontinued.  The sheath was removed  from her right groin.  The patient was experiencing a significant amount  of back and hip pain.  She does have history of arthritis in her back as  well as right hip bursitis.  She was given pain medications for her  discomfort.  There was some question of a hematoma at the right groin  site, however, this appeared diffuse and could not be initially  confirmed.   The patient is currently on bed rest x6 hours following removal of the  sheath.  If she remains stable, she will be ambulated at the end of her  bed rest and arrangements will be made to proceed with discharge later  this evening.   LABORATORY DATA AND X-RAY FINDINGS:  A basic metabolic panel on the day  of discharge revealed a BUN of 7, creatinine 0.57, GFR was 60, potassium  was 3.6, sodium 133, glucose 131.  A CBC revealed hemoglobin 11.6,  hematocrit 33.7, WBC 7.1, platelet count was 209,000.  A basic metabolic  panel on August 28, had revealed a BUN of 8, creatinine 0.72, GFR was  greater than 60, potassium was 4, sodium was 133, chloride was 94.  A  CBC on August 28, revealed hemoglobin 12.9, hematocrit 38.1, WBC 6.7,  platelets 286,000.   DISCHARGE MEDICATIONS:  The patient was told to continue her medications  as previously taken.  She would need to remain on Plavix for at least 2  weeks following placement of the stent.  She would also continue aspirin  81 mg daily.   WOUND CARE:  The patient was given instructions regarding wound care.   ACTIVITY:  She was told not to drive or do anything strenuous for at  least 2 weeks following her procedure.  The patient will see Dr.  Corliss Skains back in the office in 2 weeks.  She was told to follow up with  her primary care physician, Dr. Julieanne Manson, as needed or as  scheduled.   DISCHARGE DIAGNOSES:  1. Status post percutaneous transluminal angiography stenting of the       right middle cerebral artery performed under general anesthesia      with no obvious or known complications.  2. 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  3. Hyperlipidemia.  4. Gastroesophageal reflux disease.  5. History of headaches.  6. History of breast cancer, status post lumpectomy and radiation      therapy.  7. History of small cerebrovascular accident by magnetic resonance      imaging.  8. Status post multiple surgeries.  9. Allergies to SULFA and PENICILLIN.  10.Mild anemia.  11.Previous history of methicillin-resistant Staphylococcus aureus      requiring surgical intervention.  12.History of depression.      Delton See, P.A.    ______________________________  Grandville Silos. Corliss Skains, M.D.    DR/MEDQ  D:  02/03/2007  T:  02/03/2007  Job:  [id number]   cc:   Janan Ridge

## 2010-10-16 NOTE — Op Note (Signed)
Jodi Colon, Jodi Colon                 ACCOUNT NO.:  [id number]   MEDICAL RECORD NO.:  [id number]          PATIENT TYPE:  AMB   LOCATION:  ENDO                         FACILITY:  [facility]   PHYSICIAN:  Bernette Redbird, M.D.   DATE OF BIRTH:  September 03, 1929   DATE OF PROCEDURE:  04/02/2004  DATE OF DISCHARGE:                                 OPERATIVE REPORT   PROCEDURE PERFORMED:  Upper endoscopy.   ENDOSCOPIST:  Florencia Reasons, M.D.   INDICATIONS FOR PROCEDURE:  Reflux symptomatology in a 77 year old female  with some regurgitation recently.   FINDINGS:  Essentially normal exam.   DESCRIPTION OF PROCEDURE:  The nature, purpose and risks of the procedure  were familiar to the patient from prior examinations and she provided  written consent.  Sedation was fentanyl 70 mcg and Versed 7 mg IV without  arrhythmias or desaturation.  The Olympus small caliber adult video  endoscope was passed under direct vision.  There did appear to be some edema  of the arytenoid cartilages although the vocal cords themselves looked  normal to me.   The esophagus was entered without difficulty and had normal mucosa without  free reflux or any obvious reflux esophagitis, Barrett's esophagus, varices,  infection, neoplasia or any Mallory-Weiss tear, ring, stricture or hiatal  hernia. The stomach contained minimal bilious residual.  No gastritis,  erosions, ulcers, polyps or masses were observed and a retroflex view of the  cardia showed no hiatal hernia.  The pylorus, duodenal bulb and second  duodenum looked normal.   The scope was then removed from the patient.  The patient tolerated the  procedure well and there were no apparent complications.   IMPRESSION:  Essentially normal endoscopy.  Possible reflux laryngitis.   PLAN:  Continue proton pump inhibitor therapy but unless the regurgitation  symptoms worsen, probably no change in management is needed. Consideration  might be given to a prokinetic agent  if regurgitation continues to be a  problem.       RB/MEDQ  D:  04/02/2004  T:  04/02/2004  Job:  [id number]   cc:   Julieanne Manson  [address]  [place]  [place] [address]  Fax: [phone]

## 2010-10-16 NOTE — Procedures (Signed)
[facility]  Patient:    Jodi Colon, Jodi Colon                        MRN: [id number] Proc. Date: 04/06/00 Adm. Date:  [id number] Attending:  Rich Brave CC:         Dr. Franne Forts, [facility] or [facility]   Procedure Report  PROCEDURE:  Upper endoscopy.  INDICATION:  A 77 year old female with severe chest burning and discomfort, unresponsive to Prilosec and Diflucan.  FINDINGS:  Normal exam.  DESCRIPTION OF PROCEDURE:  The nature, purpose, and risks of the procedure were familiar to the patient, who provided written consent.  Sedation was fentanyl 60 mcg and Versed 6 mg IV without arrhythmias or desaturation.  The Olympus GIF-160 small-caliber adult video endoscope was passed easily under direct vision.  The vocal cords looked normal with perhaps some questionable thickening of the posterior commissure of the larynx.  The esophagus was endoscopically normal without any evidence of reflux esophagitis, Barretts esophagus, varices, infection, or neoplasia.  No source of chest pain was evident.  There did appear to be fairly active esophageal contractility, but I did not see any obvious spasm of the esophagus.  There was an intermittently-visible esophageal ring at the squamocolumnar junction with a spontaneously-reducing 1-2 cm hiatal hernia present.  The stomach was entered.  It contained no significant residual and had just a little bit of antral erythema but no erosions, ulcers, polyps, or masses.  A retroflexed view of the proximal stomach was unremarkable.  The pylorus, duodenal bulb, and second duodenum also looked normal.  No biopsies were obtained.  The patient tolerated the procedure well, and there were no apparent complications.  No biopsies were obtained.  IMPRESSION:  Essentially normal upper endoscopy other than some slight antral erythema of dubious clinical significance and a small hiatal hernia with an esophageal  ring.  No source of chest pain endoscopically evident.  PLAN:  Clinical follow-up. DD:  04/06/00 TD:  04/07/00 Job: [id number]

## 2010-10-16 NOTE — Op Note (Signed)
Jodi Colon, SELDERS                 ACCOUNT NO.:  [id number]   MEDICAL RECORD NO.:  [id number]          PATIENT TYPE:  AMB   LOCATION:  ENDO                         FACILITY:  [facility]   PHYSICIAN:  Bernette Redbird, M.D.   DATE OF BIRTH:  09-08-1929   DATE OF PROCEDURE:  04/02/2004  DATE OF DISCHARGE:                                 OPERATIVE REPORT   PROCEDURE:  Colonoscopy with biopsy.   INDICATIONS FOR PROCEDURE:  Prior history of colon polyps of indeterminate  histology having been removed.   FINDINGS:  Diminutive rectal polyp removed.   PROCEDURE:  The nature, purpose, and risks of the procedure were familiar to  the patient from prior examination and she provided written consent.  Sedation for this procedure and the upper endoscopy which preceded it  totalled Fentanyl 100 mcg and Versed 10 mg IV without arrhythmias or  desaturations.  The Olympus adjustable tension pediatric video colonoscope  was advanced to the cecum and the tip was nubbed into the terminal ileum  which appeared to have grossly normal mucosa, although the terminal ileum  was not freely intubated.  Pull back was then performed.  The quality of the  prep was excellent and it was felt that all areas were well seen.  In the  rectum was a 2-3 mm sessile polyp on a fold removed by a couple of cold  biopsies.  No other polyps were seen and there was no evidence of cancer,  colitis, vascular malformations or extensive diverticulosis.  Retroflexion  in the rectum and reinspection of the rectum were, otherwise, unremarkable.  The patient tolerated the procedure well and there were no apparent  complications.   IMPRESSION:  1.  Diminutive rectal polyp, 211.4.  2.  Prior history of colon polyps of indeterminate histology.   PLAN:  Await pathology on the polyp with consideration for repeat  colonoscopy in five years if it is adenomatous in Editor, commissioning.       RB/MEDQ  D:  04/02/2004  T:  04/02/2004  Job:  [id number]   cc:   Julieanne Manson  [address]  [place]  [place] [address]  Fax: [phone]

## 2010-10-16 NOTE — Op Note (Signed)
Jodi Colon, Jodi Colon                           ACCOUNT NO.:  [id number]   MEDICAL RECORD NO.:  [id number]                   PATIENT TYPE:  INP   LOCATION:  NA                                   FACILITY:  [facility]   PHYSICIAN:  Henry A. Pool, M.D.                 DATE OF BIRTH:  05-Jun-1929   DATE OF PROCEDURE:  01/28/2004  DATE OF DISCHARGE:                                 OPERATIVE REPORT   PREOPERATIVE DIAGNOSES:  Lumbar stenosis with neurogenic claudication.   POSTOPERATIVE DIAGNOSES:  Lumbar stenosis with neurogenic claudication.   OPERATION PERFORMED:  L3-4, L4-5 and L5-S1 decompressive lumbar  laminectomies with foraminotomies.   SURGEON:  Kathaleen Maser. Pool, M.D.   ASSISTANT:  Reinaldo Meeker, M.D.   ANESTHESIA:  General endotracheal.   INDICATIONS FOR PROCEDURE:  The patient is a 77 year old female with history  of back and bilateral lower extremity symptoms consistent with neurogenic  claudication.  Failing conservative management, work-up demonstrates  evidence of progressive stenosis at L3-4, L4-5 and L5-S1.  The patient has  been counseled as to her options.  She has decided to proceed with three  level lumbar decompressive laminectomy with foraminotomies.   DESCRIPTION OF PROCEDURE:  The patient was taken to the operating room and  placed on the table in the supine position.  After adequate level of  anesthesia was achieved, the patient was positioned prone onto a Wilson  frame and appropriately padded.  The patient's lumbar region was prepped and  draped sterilely.  A 10 blade was used to make a linear skin incision  overlying the L3, L4, L5 and S1 levels.  This was carried down sharply in  the midline.  A subperiosteal dissection was then performed exposing the  lamina and facet joints of L3, L4, L5 and S1.  Deep self-retaining retractor  was placed.  Intraoperative x-ray was taken, the levels were confirmed.  Decompressive laminectomy was then performed using Leksell  rongeur, Kerrison  rongeurs and a high speed drill to remove the inferior 2/3 of the lamina at  L3, the complete lamina of L4, the complete lamina of L5 and the superior  aspect of the lamina of S1.  As well, the medial aspects of the L3-4, L4-5  and L5-S1 facet joint complexes were resected.  Ligamentum flavum was then  elevated and resected in piecemeal fashion using Kerrison rongeurs.  The  underlying thecal sac was identified.  A lateral decompression was then  performed by undercutting the facets using Kerrison rongeurs to clean out  redundant ligament and osteophytes causing further compression.  Wide  foraminotomy was then performed along the course of the exiting L4, L5 and  S1 nerve roots.  After very good decompression was then achieved  bilaterally, the wound was then irrigated with antibiotic solution.  Gelfoam  was placed topically for hemostasis which was found to be good.  Microscope  and retractor system were removed.  Hemostasis in muscle was achieved with  electrocautery.  The wound was then closed in layers with Vicryl sutures.  Steri-Strips and sterile dressing were applied.  There were no apparent  complications.  The patient tolerated the procedure well and returned to the  recovery room postoperatively.                                               Henry A. Pool, M.D.   HAP/MEDQ  D:  01/28/2004  T:  01/28/2004  Job:  [id number]

## 2010-11-04 ENCOUNTER — Ambulatory Visit: Payer: Self-pay | Admitting: Pain Medicine

## 2010-11-25 ENCOUNTER — Ambulatory Visit: Payer: Self-pay | Admitting: Pain Medicine

## 2010-11-30 ENCOUNTER — Ambulatory Visit: Payer: Self-pay | Admitting: Pain Medicine

## 2010-12-21 ENCOUNTER — Ambulatory Visit: Payer: Self-pay | Admitting: Pain Medicine

## 2010-12-30 ENCOUNTER — Ambulatory Visit: Payer: Self-pay | Admitting: Pain Medicine

## 2011-01-14 ENCOUNTER — Ambulatory Visit: Payer: Self-pay | Admitting: Pain Medicine

## 2011-01-20 ENCOUNTER — Telehealth: Payer: Self-pay | Admitting: Internal Medicine

## 2011-01-21 NOTE — Telephone Encounter (Signed)
Yes we can switch her back.  Was she taking 30 or 60 mg ?  It is once tablet daily  #30 with 6 refills.

## 2011-01-21 NOTE — Telephone Encounter (Signed)
Patient called and stated she is not doing well on the Celexa, she said she is feeling the pain and feeling depressed.  She wants to know if she can go back on the Cymbalta, she knows it is more expensive but it works better for her.  Please advise

## 2011-01-22 MED ORDER — DULOXETINE HCL 60 MG PO CPEP: 60.0000 mg | ORAL_CAPSULE | Freq: Every day | ORAL | Status: DC

## 2011-01-22 NOTE — Telephone Encounter (Signed)
The pharmacy stated her last Rx of Cymbalta 60 mg.  Rx has been called in.

## 2011-01-22 NOTE — Telephone Encounter (Signed)
Addended by: Darletta Moll A on: 01/22/2011 03:12 PM   Modules accepted: Orders

## 2011-02-05 ENCOUNTER — Ambulatory Visit: Payer: PRIVATE HEALTH INSURANCE | Admitting: Internal Medicine

## 2011-02-11 ENCOUNTER — Ambulatory Visit: Payer: Self-pay | Admitting: Pain Medicine

## 2011-02-12 ENCOUNTER — Other Ambulatory Visit: Payer: Self-pay | Admitting: Internal Medicine

## 2011-02-13 MED ORDER — ATORVASTATIN CALCIUM 80 MG PO TABS: 80.0000 mg | ORAL_TABLET | Freq: Every day | ORAL | Status: DC

## 2011-02-17 LAB — BASIC METABOLIC PANEL
BUN: 13
CO2: 29
Calcium: 9.7
Chloride: 99
Creatinine, Ser: 0.82
GFR calc Af Amer: >60
GFR calc non Af Amer: >60
Glucose, Bld: 98
Potassium: 5
Sodium: 136

## 2011-02-17 LAB — CBC
HCT: 38.6
Hemoglobin: 13
MCHC: 33.7
MCV: 90.6
Platelets: 200
RBC: 4.26
RDW: 15.2
WBC: 6.4

## 2011-02-17 LAB — PROTIME-INR
INR: 1
Prothrombin Time: 13.1

## 2011-02-17 LAB — APTT: aPTT: 27

## 2011-02-26 LAB — BASIC METABOLIC PANEL
BUN: 9
CO2: 25
Calcium: 9.4
Chloride: 99
Creatinine, Ser: 0.79
GFR calc Af Amer: >60
GFR calc non Af Amer: >60
Glucose, Bld: 98
Potassium: 3.5
Sodium: 134 — ABNORMAL LOW

## 2011-02-26 LAB — CBC
HCT: 37
Hemoglobin: 12.8
MCHC: 34.6
MCV: 92.6
Platelets: 195
RBC: 4
RDW: 13.7
WBC: 5.8

## 2011-02-26 LAB — PROTIME-INR
INR: 0.9
Prothrombin Time: 12.1

## 2011-03-01 ENCOUNTER — Other Ambulatory Visit: Payer: Self-pay | Admitting: Internal Medicine

## 2011-03-01 ENCOUNTER — Ambulatory Visit: Payer: Self-pay | Admitting: Pain Medicine

## 2011-03-01 MED ORDER — LEVOTHYROXINE SODIUM 50 MCG PO TABS: 50.0000 ug | ORAL_TABLET | Freq: Every day | ORAL | Status: DC

## 2011-03-08 ENCOUNTER — Telehealth: Payer: Self-pay | Admitting: Internal Medicine

## 2011-03-08 NOTE — Telephone Encounter (Signed)
Message copied by Edd Fabian on Mon Mar 08, 2011 12:04 PM ------      Message from: Duncan Dull      Created: Wed Mar 03, 2011  6:39 PM      Regarding: labs from someplace       I received labs that someone drew on her on 9/26.  They were normal except for a mildly elevated white count.  I'm not sure who or why they were ordered since she hasn't been sere in the new office yet. .  But she should have a repeat CBC wi diff in 6 weeks

## 2011-03-08 NOTE — Telephone Encounter (Signed)
Patient stated Dr. Ronney Asters her GI doctor ordered the labs because she has been on Plavix for so long.  Patient also stated she has an appt with you in November and could get the repeat lab done then.

## 2011-03-12 LAB — BASIC METABOLIC PANEL
BUN: 7
BUN: 8
CO2: 25
CO2: 30
Calcium: 8.6
Calcium: 9.7
Chloride: 101
Chloride: 94 — ABNORMAL LOW
Creatinine, Ser: 0.57
Creatinine, Ser: 0.72
GFR calc Af Amer: >60
GFR calc Af Amer: >60
GFR calc non Af Amer: >60
GFR calc non Af Amer: >60
Glucose, Bld: 131 — ABNORMAL HIGH
Glucose, Bld: 96
Potassium: 3.6
Potassium: 4
Sodium: 133 — ABNORMAL LOW
Sodium: 133 — ABNORMAL LOW

## 2011-03-12 LAB — CBC
HCT: 33.7 — ABNORMAL LOW
HCT: 38.1
Hemoglobin: 11.6 — ABNORMAL LOW
Hemoglobin: 12.9
MCHC: 34.3
MCHC: 33.7
MCV: 92.1
MCV: 93.7
Platelets: 209
Platelets: 286
RBC: 3.66 — ABNORMAL LOW
RBC: 4.07
RDW: 13.5
RDW: 13.2
WBC: 7.1
WBC: 6.7

## 2011-03-12 LAB — DIFFERENTIAL
Basophils Absolute: 0
Basophils Relative: 1
Eosinophils Absolute: 0.2
Eosinophils Relative: 3
Lymphocytes Relative: 23
Lymphs Abs: 1.5
Monocytes Absolute: 0.4
Monocytes Relative: 7
Neutro Abs: 4.5
Neutrophils Relative %: 67

## 2011-03-12 LAB — PROTIME-INR
INR: 1
INR: 1
Prothrombin Time: 13.5
Prothrombin Time: 13.2

## 2011-03-12 LAB — APTT
aPTT: 120 — ABNORMAL HIGH
aPTT: 27
aPTT: 44 — ABNORMAL HIGH
aPTT: 30

## 2011-03-22 ENCOUNTER — Telehealth: Payer: Self-pay | Admitting: Internal Medicine

## 2011-03-22 NOTE — Telephone Encounter (Signed)
Message copied by Edd Fabian on Mon Mar 22, 2011  3:00 PM ------      Message from: Duncan Dull      Created: Sun Mar 21, 2011 10:29 AM      Regarding: Labs, UTI       Her kidney function has improved, but she has a UTI,  Ciprofloxacin has been called to [facility],  Dose is once time daily for one week

## 2011-03-22 NOTE — Telephone Encounter (Signed)
I called patient to notified her of the results, and she stated she has not given a urine or blood specimen and doesn't know if this has been switched with another patient.  I asked her if she had labs done at another office and they may have sent them here and she stated she did not.

## 2011-03-22 NOTE — Telephone Encounter (Signed)
Message copied by Edd Fabian on Mon Mar 22, 2011  2:57 PM ------      Message from: Duncan Dull      Created: Sun Mar 21, 2011 10:29 AM      Regarding: Labs, UTI       Her kidney function has improved, but she has a UTI,  Ciprofloxacin has been called to [facility],  Dose is once time daily for one week

## 2011-03-22 NOTE — Telephone Encounter (Signed)
I am also confused,  The labs are on Jodi Colon. And gthe rx was called in for [facility]

## 2011-03-30 ENCOUNTER — Ambulatory Visit: Payer: Self-pay | Admitting: Pain Medicine

## 2011-04-02 ENCOUNTER — Ambulatory Visit (INDEPENDENT_AMBULATORY_CARE_PROVIDER_SITE_OTHER): Payer: 59 | Admitting: Internal Medicine

## 2011-04-02 ENCOUNTER — Encounter: Payer: Self-pay | Admitting: Internal Medicine

## 2011-04-02 DIAGNOSIS — E785 Hyperlipidemia, unspecified: Secondary | ICD-10-CM | POA: Insufficient documentation

## 2011-04-02 DIAGNOSIS — I679 Cerebrovascular disease, unspecified: Secondary | ICD-10-CM

## 2011-04-02 DIAGNOSIS — Z23 Encounter for immunization: Secondary | ICD-10-CM

## 2011-04-02 DIAGNOSIS — A4902 Methicillin resistant Staphylococcus aureus infection, unspecified site: Secondary | ICD-10-CM

## 2011-04-02 DIAGNOSIS — Z7901 Long term (current) use of anticoagulants: Secondary | ICD-10-CM

## 2011-04-02 DIAGNOSIS — M1990 Unspecified osteoarthritis, unspecified site: Secondary | ICD-10-CM

## 2011-04-02 DIAGNOSIS — D72829 Elevated white blood cell count, unspecified: Secondary | ICD-10-CM

## 2011-04-02 DIAGNOSIS — J329 Chronic sinusitis, unspecified: Secondary | ICD-10-CM

## 2011-04-02 MED ORDER — AZITHROMYCIN 500 MG PO TABS: 500.0000 mg | ORAL_TABLET | Freq: Every day | ORAL | Status: AC

## 2011-04-02 MED ORDER — DOXYCYCLINE HYCLATE 100 MG PO TABS: 100.0000 mg | ORAL_TABLET | Freq: Two times a day (BID) | ORAL | Status: AC

## 2011-04-02 NOTE — Patient Instructions (Addendum)
I recommend taking a stool softener such as colace once or twice daily to prevent irritation of hemorrhoids from constipation  Return for fasting labs next week or so.     If the doxycycline bothers your stomach too much,  Call the office for an alternative.   The azithromycin is for your sinuses,  The doxy is for your nose   I recommmend going to the health Dept for your TdaP vaccine.

## 2011-04-02 NOTE — Progress Notes (Signed)
  Subjective:    Patient ID: Jodi Colon, female    DOB: [birth year], 77 y.o.   MRN: [id number]  HPI  77 yo white female with history of MRSA skin infections presents with a 4 day history of sinus pain , chills and  5 day history of swelling of the nasolabial fold on the left which is painful.  She has not tried to treat it other than with warm compresses. No fevers, chills,  Productive cough.    Past Medical History  Diagnosis Date  . Degenerative joint disease     [facility]  . Hypertension   . Hyperlipidemia   . MRSA (methicillin resistant Staphylococcus aureus) 2008    perineal abscess  . Brain aneurysm     small, followed with regular angiograms, Dr. Corliss Skains  . Spinal stenosis   . DCIS (ductal carcinoma in situ) of breast 2007    Right, s/p lumpectomy/xrt   . Cerebrovascular disease 2008    s/p PTCA/stent of MCA, Deveshwar   Current Outpatient Prescriptions on File Prior to Visit  Medication Sig Dispense Refill  . atorvastatin (LIPITOR) 80 MG tablet Take 1 tablet (80 mg total) by mouth daily.  30 tablet  3  . DULoxetine (CYMBALTA) 60 MG capsule Take 1 capsule (60 mg total) by mouth daily.  30 capsule  6  . levothyroxine (SYNTHROID) 50 MCG tablet Take 1 tablet (50 mcg total) by mouth daily. Take two tablets on Sunday.  102 tablet  2      Review of Systems  Constitutional: Negative for fever, chills and unexpected weight change.  HENT: Negative for hearing loss, ear pain, nosebleeds, congestion, sore throat, facial swelling, rhinorrhea, sneezing, mouth sores, trouble swallowing, neck pain, neck stiffness, voice change, postnasal drip, sinus pressure, tinnitus and ear discharge.   Eyes: Negative for pain, discharge, redness and visual disturbance.  Respiratory: Negative for cough, chest tightness, shortness of breath, wheezing and stridor.   Cardiovascular: Negative for chest pain, palpitations and leg swelling.  Musculoskeletal: Negative for myalgias and  arthralgias.  Skin: Negative for color change and rash.  Neurological: Negative for dizziness, weakness, light-headedness and headaches.  Hematological: Negative for adenopathy.       Objective:   Physical Exam  HENT:  Nose:            Assessment & Plan:  MRSA infection of nasolabial fold: suspected by exam.  Has septra allergy,  Will rx doxycyline.  Sinusitis:  Will treat with azithromycin for 7 days.

## 2011-04-04 ENCOUNTER — Encounter: Payer: Self-pay | Admitting: Internal Medicine

## 2011-04-04 DIAGNOSIS — M1990 Unspecified osteoarthritis, unspecified site: Secondary | ICD-10-CM | POA: Insufficient documentation

## 2011-04-04 DIAGNOSIS — I679 Cerebrovascular disease, unspecified: Secondary | ICD-10-CM | POA: Insufficient documentation

## 2011-04-04 DIAGNOSIS — A4902 Methicillin resistant Staphylococcus aureus infection, unspecified site: Secondary | ICD-10-CM | POA: Insufficient documentation

## 2011-04-07 ENCOUNTER — Other Ambulatory Visit: Payer: 59

## 2011-04-14 ENCOUNTER — Other Ambulatory Visit (INDEPENDENT_AMBULATORY_CARE_PROVIDER_SITE_OTHER): Payer: 59 | Admitting: *Deleted

## 2011-04-14 DIAGNOSIS — D72829 Elevated white blood cell count, unspecified: Secondary | ICD-10-CM

## 2011-04-14 DIAGNOSIS — E785 Hyperlipidemia, unspecified: Secondary | ICD-10-CM

## 2011-04-14 DIAGNOSIS — Z7901 Long term (current) use of anticoagulants: Secondary | ICD-10-CM

## 2011-04-14 LAB — CBC WITH DIFFERENTIAL/PLATELET
Basophils Absolute: 0 10*3/uL (ref 0.0–0.1)
Basophils Relative: 0.6 % (ref 0.0–3.0)
Eosinophils Absolute: 0.4 10*3/uL (ref 0.0–0.7)
Eosinophils Relative: 6.7 % — ABNORMAL HIGH (ref 0.0–5.0)
HCT: 35.6 % — ABNORMAL LOW (ref 36.0–46.0)
Hemoglobin: 12.1 g/dL (ref 12.0–15.0)
Lymphocytes Relative: 25.2 % (ref 12.0–46.0)
Lymphs Abs: 1.4 10*3/uL (ref 0.7–4.0)
MCHC: 34 g/dL (ref 30.0–36.0)
MCV: 96.1 fl (ref 78.0–100.0)
Monocytes Absolute: 0.5 10*3/uL (ref 0.1–1.0)
Monocytes Relative: 8.2 % (ref 3.0–12.0)
Neutro Abs: 3.3 10*3/uL (ref 1.4–7.7)
Neutrophils Relative %: 59.3 % (ref 43.0–77.0)
Platelets: 179 10*3/uL (ref 150.0–400.0)
RBC: 3.71 Mil/uL — ABNORMAL LOW (ref 3.87–5.11)
RDW: 13.6 % (ref 11.5–14.6)
WBC: 5.6 10*3/uL (ref 4.5–10.5)

## 2011-04-14 LAB — LIPID PANEL
Cholesterol: 147 mg/dL (ref 0–200)
HDL: 61.3 mg/dL (ref >39.00)
LDL Cholesterol: 66 mg/dL (ref 0–99)
Total CHOL/HDL Ratio: 2
Triglycerides: 100 mg/dL (ref 0.0–149.0)
VLDL: 20 mg/dL (ref 0.0–40.0)

## 2011-04-14 LAB — COMPREHENSIVE METABOLIC PANEL
ALT: 22 U/L (ref 0–35)
AST: 25 U/L (ref 0–37)
Albumin: 3.8 g/dL (ref 3.5–5.2)
Alkaline Phosphatase: 86 U/L (ref 39–117)
BUN: 10 mg/dL (ref 6–23)
CO2: 25 mEq/L (ref 19–32)
Calcium: 9 mg/dL (ref 8.4–10.5)
Chloride: 106 mEq/L (ref 96–112)
Creatinine, Ser: 0.8 mg/dL (ref 0.4–1.2)
GFR: 71.5 mL/min (ref >60.00)
Glucose, Bld: 92 mg/dL (ref 70–99)
Potassium: 4.1 mEq/L (ref 3.5–5.1)
Sodium: 138 mEq/L (ref 135–145)
Total Bilirubin: 0.6 mg/dL (ref 0.3–1.2)
Total Protein: 6.3 g/dL (ref 6.0–8.3)

## 2011-04-16 ENCOUNTER — Other Ambulatory Visit: Payer: Self-pay | Admitting: Internal Medicine

## 2011-04-16 MED ORDER — NYSTATIN 100000 UNIT/ML MT SUSP: OROMUCOSAL | Status: DC

## 2011-04-19 ENCOUNTER — Ambulatory Visit: Payer: Self-pay | Admitting: General Surgery

## 2011-04-28 ENCOUNTER — Ambulatory Visit: Payer: Self-pay | Admitting: Pain Medicine

## 2011-06-01 HISTORY — PX: COLONOSCOPY: SHX174

## 2011-06-07 ENCOUNTER — Other Ambulatory Visit: Payer: Self-pay | Admitting: *Deleted

## 2011-06-07 MED ORDER — LISINOPRIL 40 MG PO TABS: 40.0000 mg | ORAL_TABLET | Freq: Every day | ORAL | Status: DC

## 2011-06-08 ENCOUNTER — Other Ambulatory Visit: Payer: Self-pay | Admitting: *Deleted

## 2011-06-08 MED ORDER — PANTOPRAZOLE SODIUM 40 MG PO TBEC: 40.0000 mg | DELAYED_RELEASE_TABLET | Freq: Two times a day (BID) | ORAL | Status: DC

## 2011-06-08 NOTE — Telephone Encounter (Signed)
Faxed refill request for pantoprazole from [facility].  Last filled 04/29/11.  Request is for one tablet twice a day and chart has one once a day.  Please advise.

## 2011-06-10 ENCOUNTER — Other Ambulatory Visit: Payer: Self-pay | Admitting: *Deleted

## 2011-06-10 MED ORDER — NIACIN ER (ANTIHYPERLIPIDEMIC) 500 MG PO TBCR: 500.0000 mg | EXTENDED_RELEASE_TABLET | Freq: Every day | ORAL | Status: DC

## 2011-06-10 NOTE — Telephone Encounter (Signed)
Faxed request from [facility], last filled 03/19/11.

## 2011-06-16 ENCOUNTER — Other Ambulatory Visit: Payer: Self-pay | Admitting: *Deleted

## 2011-06-16 MED ORDER — CLOPIDOGREL BISULFATE 75 MG PO TABS: 75.0000 mg | ORAL_TABLET | Freq: Every day | ORAL | Status: DC

## 2011-06-16 MED ORDER — ATORVASTATIN CALCIUM 80 MG PO TABS: 80.0000 mg | ORAL_TABLET | Freq: Every day | ORAL | Status: DC

## 2011-06-22 ENCOUNTER — Ambulatory Visit: Payer: Self-pay | Admitting: Pain Medicine

## 2011-07-26 ENCOUNTER — Ambulatory Visit: Payer: Self-pay | Admitting: Pain Medicine

## 2011-08-23 ENCOUNTER — Ambulatory Visit: Payer: Self-pay | Admitting: Pain Medicine

## 2011-08-30 ENCOUNTER — Other Ambulatory Visit: Payer: Self-pay | Admitting: Internal Medicine

## 2011-08-30 MED ORDER — DULOXETINE HCL 60 MG PO CPEP: 60.0000 mg | ORAL_CAPSULE | Freq: Every day | ORAL | Status: DC

## 2011-09-21 ENCOUNTER — Ambulatory Visit: Payer: Self-pay | Admitting: Pain Medicine

## 2011-10-04 ENCOUNTER — Telehealth: Payer: Self-pay | Admitting: Internal Medicine

## 2011-10-04 NOTE — Telephone Encounter (Signed)
Patient wants to be tested for MRSA before her surgery and if she test positive she wants to be referred to Dr. Leavy Cella.

## 2011-10-06 NOTE — Telephone Encounter (Signed)
Patient called back about this and I gave her an appointment for Friday since she really wanted the blood work done before surgery.  I advised patient I was making an appointment with Dr. Darrick Huntsman so she could see her and they could discuss referral to  Dr. Leavy Cella.

## 2011-10-08 ENCOUNTER — Inpatient Hospital Stay: Payer: Self-pay | Admitting: Specialist

## 2011-10-08 ENCOUNTER — Ambulatory Visit (INDEPENDENT_AMBULATORY_CARE_PROVIDER_SITE_OTHER): Payer: 59 | Admitting: Internal Medicine

## 2011-10-08 ENCOUNTER — Encounter: Payer: Self-pay | Admitting: Internal Medicine

## 2011-10-08 VITALS — BP 128/56 | HR 75 | Temp 98.4°F | Resp 16

## 2011-10-08 DIAGNOSIS — L0231 Cutaneous abscess of buttock: Secondary | ICD-10-CM

## 2011-10-08 DIAGNOSIS — L03317 Cellulitis of buttock: Secondary | ICD-10-CM

## 2011-10-08 LAB — COMPREHENSIVE METABOLIC PANEL
Albumin: 3.6 g/dL (ref 3.4–5.0)
Alkaline Phosphatase: 94 U/L (ref 50–136)
Anion Gap: 9 (ref 7–16)
BUN: 10 mg/dL (ref 7–18)
Bilirubin,Total: 0.8 mg/dL (ref 0.2–1.0)
Calcium, Total: 9.1 mg/dL (ref 8.5–10.1)
Chloride: 102 mmol/L (ref 98–107)
Co2: 23 mmol/L (ref 21–32)
Creatinine: 0.94 mg/dL (ref 0.60–1.30)
EGFR (African American): >60
EGFR (Non-African Amer.): 58 — ABNORMAL LOW
Glucose: 101 mg/dL — ABNORMAL HIGH (ref 65–99)
Osmolality: 267 (ref 275–301)
Potassium: 3.7 mmol/L (ref 3.5–5.1)
SGOT(AST): 23 U/L (ref 15–37)
SGPT (ALT): 22 U/L
Sodium: 134 mmol/L — ABNORMAL LOW (ref 136–145)
Total Protein: 7.1 g/dL (ref 6.4–8.2)

## 2011-10-08 LAB — CBC WITH DIFFERENTIAL/PLATELET
Basophil #: 0 10*3/uL (ref 0.0–0.1)
Basophil %: 0.3 %
Eosinophil #: 0.1 10*3/uL (ref 0.0–0.7)
Eosinophil %: 0.9 %
HCT: 34.3 % — ABNORMAL LOW (ref 35.0–47.0)
HGB: 11.6 g/dL — ABNORMAL LOW (ref 12.0–16.0)
Lymphocyte #: 1.4 10*3/uL (ref 1.0–3.6)
Lymphocyte %: 15.6 %
MCH: 31.7 pg (ref 26.0–34.0)
MCHC: 33.9 g/dL (ref 32.0–36.0)
MCV: 94 fL (ref 80–100)
Monocyte #: 0.8 x10 3/mm (ref 0.2–0.9)
Monocyte %: 9 %
Neutrophil #: 6.5 10*3/uL (ref 1.4–6.5)
Neutrophil %: 74.2 %
Platelet: 157 10*3/uL (ref 150–440)
RBC: 3.66 10*6/uL — ABNORMAL LOW (ref 3.80–5.20)
RDW: 14.5 % (ref 11.5–14.5)
WBC: 8.8 10*3/uL (ref 3.6–11.0)

## 2011-10-08 LAB — PROTIME-INR
INR: 1
Prothrombin Time: 13.2 secs (ref 11.5–14.7)

## 2011-10-08 NOTE — Progress Notes (Signed)
Patient ID: Jodi Colon, female   DOB: Sep 07, 1929, 78 y.o.   MRN: [id number]  Patient Active Problem List  Diagnoses  . Hyperlipidemia LDL goal < 100  . Degenerative joint disease  . Cerebrovascular disease  . MRSA (methicillin resistant Staphylococcus aureus)  . Abscess, gluteal, right    Subjective:  CC:   No chief complaint on file.   HPI:   Jodi Colon a 78 y.o. female who presents with, chills, right buttock pain and erythema.  Patient is a 78 year old white female with a history of recurrent MRSA skin infections who presents with a five-day history of increasing redness pain and swelling of the right buttock. Apparently she called the office on Monday requesting referral to Dr. Leavy Cella for MRSA infection and was told she would need to be seen here first and she had not been seen here since the move. Patient states she's been trying to get in since Monday,  Because she is incredible pain and needed to be seen urgently. However none of this was conveyed to me and she was put in  the next available 30 minute slot. She is quite upset today and accompanied by her daughter.  She was apparently evaluated by a general surgeon at [facility] for future repair of a hernia and was told that she needed to see her primary care physician for treatment of her developing cellulitis  This was over a week ago and no antibiotics were given.    Past Medical History  Diagnosis Date  . Degenerative joint disease     [facility]  . Hypertension   . Hyperlipidemia   . MRSA (methicillin resistant Staphylococcus aureus) 2008    perineal abscess  . Brain aneurysm     small, followed with regular angiograms, Dr. Corliss Skains  . Spinal stenosis   . DCIS (ductal carcinoma in situ) of breast 2007    Right, s/p lumpectomy/xrt   . Cerebrovascular disease 2008    s/p PTCA/stent of MCA, Deveshwar    Past Surgical History  Procedure Date  . Cholecystectomy          The following portions  of the patient's history were reviewed and updated as appropriate: Allergies, current medications, and problem list.    Review of Systems:   12 Pt  review of systems was negative except those addressed in the HPI,     History   Social History  . Marital Status: Widowed    Spouse Name: N/A    Number of Children: N/A  . Years of Education: N/A   Occupational History  . Not on file.   Social History Main Topics  . Smoking status: Never Smoker   . Smokeless tobacco: Never Used  . Alcohol Use: No  . Drug Use: No  . Sexually Active: Not on file   Other Topics Concern  . Not on file   Social History Narrative  . No narrative on file    Objective:  BP 128/56  Pulse 75  Temp(Src) 98.4 F (36.9 C) (Oral)  Resp 16  SpO2 97%  General appearance: alert, cooperative and appears stated age Ears: normal TM's and external ear canals both ears Throat: lips, mucosa, and tongue normal; teeth and gums normal Neck: no adenopathy, no carotid bruit, supple, symmetrical, trachea midline and thyroid not enlarged, symmetric, no tenderness/mass/nodules Back: symmetric, no curvature. ROM normal. No CVA tenderness. Lungs: clear to auscultation bilaterally Heart: regular rate and rhythm, S1, S2 normal, no murmur, click,  rub or gallop Abdomen: soft, non-tender; bowel sounds normal; no masses,  no organomegaly Pulses: 2+ and symmetric Skin: Right gluteus has 6 cm of erythema and tense indurated fluctuant area with a central draining lesion. Lymph nodes: Cervical, supraclavicular, and axillary nodes normal.  Assessment and Plan:  Abscess, gluteal, right Her abcess appears to have drained at some point during this process but it is tense it exquisitely tender and mildly fluctuant. Because of the severity of pain and the extent of the erythema I have recommended that she be admitted to the hospital. I have spoken with both Victorino Dike and at 10 with the hospitalist who agrees to admit the  patient after my direct admission to the floor. Dr. Doristine Counter will see her and I have made her n.p.o. in the event he needs to take her to the OR for conscious for sedation prior to procedure. Patient is agreeable with this plan.  I will start her on empiric vancomycin. She has a penicillin allergy.  40 minutes was spent evaluating patient and arranging for patient's admission to hospital.     Updated Medication List Outpatient Encounter Prescriptions as of 10/08/2011  Medication Sig Dispense Refill  . aspirin 325 MG tablet Take 81 mg by mouth daily.       Marland Kitchen atorvastatin (LIPITOR) 80 MG tablet Take 1 tablet (80 mg total) by mouth daily.  30 tablet  3  . Calcium Carbonate-Vitamin D (CALCIUM + D) 600-200 MG-UNIT TABS Take by mouth.        . clopidogrel (PLAVIX) 75 MG tablet Take 1 tablet (75 mg total) by mouth daily.  30 tablet  3  . DULoxetine (CYMBALTA) 60 MG capsule Take 1 capsule (60 mg total) by mouth daily.  30 capsule  6  . gabapentin (NEURONTIN) 100 MG capsule Take 100 mg by mouth 3 (three) times daily.        Marland Kitchen HYDROcodone-acetaminophen (VICODIN) 5-500 MG per tablet Take 1 tablet by mouth every 6 (six) hours as needed.        Marland Kitchen levothyroxine (SYNTHROID) 50 MCG tablet Take 1 tablet (50 mcg total) by mouth daily. Take two tablets on Sunday.  102 tablet  2  . lisinopril (PRINIVIL,ZESTRIL) 40 MG tablet Take 1 tablet (40 mg total) by mouth daily.  30 tablet  10  . Multiple Vitamin (MULTIVITAMIN) tablet Take 1 tablet by mouth daily.        . niacin (NIASPAN) 500 MG CR tablet Take 1 tablet (500 mg total) by mouth at bedtime.  90 tablet  3  . nystatin (MYCOSTATIN) 100000 UNIT/ML suspension Swish in mouth and swallow one teaspoonful four times a day.  150 mL  0  . pantoprazole (PROTONIX) 40 MG tablet Take 1 tablet (40 mg total) by mouth 2 (two) times daily.  60 tablet  11  . PARoxetine (PAXIL) 20 MG tablet Take 20 mg by mouth every morning.        . topiramate (TOPAMAX) 25 MG tablet Take 25 mg by  mouth. 3 tablets at bedtime       . DISCONTD: niacin 500 MG CR capsule Take 500 mg by mouth at bedtime.           No orders of the defined types were placed in this encounter.    No Follow-up on file.

## 2011-10-09 LAB — BASIC METABOLIC PANEL
Anion Gap: 9 (ref 7–16)
BUN: 11 mg/dL (ref 7–18)
Calcium, Total: 7.8 mg/dL — ABNORMAL LOW (ref 8.5–10.1)
Chloride: 109 mmol/L — ABNORMAL HIGH (ref 98–107)
Co2: 21 mmol/L (ref 21–32)
Creatinine: 0.79 mg/dL (ref 0.60–1.30)
EGFR (African American): >60
EGFR (Non-African Amer.): >60
Glucose: 110 mg/dL — ABNORMAL HIGH (ref 65–99)
Osmolality: 278 (ref 275–301)
Potassium: 3.7 mmol/L (ref 3.5–5.1)
Sodium: 139 mmol/L (ref 136–145)

## 2011-10-09 LAB — CBC WITH DIFFERENTIAL/PLATELET
Basophil #: 0 10*3/uL (ref 0.0–0.1)
Basophil %: 0.3 %
Eosinophil #: 0.1 10*3/uL (ref 0.0–0.7)
Eosinophil %: 1 %
HCT: 30.2 % — ABNORMAL LOW (ref 35.0–47.0)
HGB: 10.3 g/dL — ABNORMAL LOW (ref 12.0–16.0)
Lymphocyte #: 1.3 10*3/uL (ref 1.0–3.6)
Lymphocyte %: 15.8 %
MCH: 32.1 pg (ref 26.0–34.0)
MCHC: 34.1 g/dL (ref 32.0–36.0)
MCV: 94 fL (ref 80–100)
Monocyte #: 0.8 x10 3/mm (ref 0.2–0.9)
Monocyte %: 9.6 %
Neutrophil #: 6 10*3/uL (ref 1.4–6.5)
Neutrophil %: 73.3 %
Platelet: 139 10*3/uL — ABNORMAL LOW (ref 150–440)
RBC: 3.21 10*6/uL — ABNORMAL LOW (ref 3.80–5.20)
RDW: 14.3 % (ref 11.5–14.5)
WBC: 8.2 10*3/uL (ref 3.6–11.0)

## 2011-10-10 ENCOUNTER — Encounter: Payer: Self-pay | Admitting: Internal Medicine

## 2011-10-10 DIAGNOSIS — L0231 Cutaneous abscess of buttock: Secondary | ICD-10-CM | POA: Insufficient documentation

## 2011-10-10 NOTE — Assessment & Plan Note (Addendum)
Her abcess appears to have drained at some point during this process but it is tense it exquisitely tender and mildly fluctuant. Because of the severity of pain and the extent of the erythema I have recommended that she be admitted to the hospital. I have spoken with both Victorino Dike and at 10 with the hospitalist who agrees to admit the patient after my direct admission to the floor. Dr. Doristine Counter will see her and I have made her n.p.o. in the event he needs to take her to the OR for conscious for sedation prior to procedure. Patient is agreeable with this plan.  I will start her on empiric vancomycin. She has a penicillin allergy.

## 2011-10-11 LAB — VANCOMYCIN, TROUGH: Vancomycin, Trough: 10 ug/mL (ref 10–20)

## 2011-10-12 LAB — WOUND CULTURE

## 2011-10-28 ENCOUNTER — Ambulatory Visit: Payer: Self-pay | Admitting: Pain Medicine

## 2011-11-01 DIAGNOSIS — M549 Dorsalgia, unspecified: Secondary | ICD-10-CM | POA: Insufficient documentation

## 2011-11-01 DIAGNOSIS — G8929 Other chronic pain: Secondary | ICD-10-CM | POA: Insufficient documentation

## 2011-11-11 DIAGNOSIS — D649 Anemia, unspecified: Secondary | ICD-10-CM | POA: Insufficient documentation

## 2011-11-11 DIAGNOSIS — K635 Polyp of colon: Secondary | ICD-10-CM | POA: Insufficient documentation

## 2011-11-23 ENCOUNTER — Ambulatory Visit: Payer: Self-pay | Admitting: Pain Medicine

## 2011-11-25 ENCOUNTER — Telehealth: Payer: Self-pay | Admitting: Internal Medicine

## 2011-11-25 NOTE — Telephone Encounter (Signed)
I called patient to ask if she needed to see Dr. Darrick Huntsman for an appointment on Friday or if she was only coming in to discuss her bill.  She stated that she was no longer seeing Dr. Darrick Huntsman as a patient and that when her son called in that was the reason for him scheduling the appointment "she said evidently Robin didn't understand the reason for the call" I advised patient that I had set up the appointment.  After talking with Arlys John and Katrina from Risk management, I was advised to let the son or patient call her.  I explained to Jodi Colon that I was cancelling her appointment for Friday and I would set up another time for [place] to discuss her bill.  I also attempted to give her Katrinia's contact information, however she only took her name.  She states that her son would be calling me back to discuss this further that if our front end staff new what MRSA was then she wouldn't have ended up in the hospital with surgery on Friday and a 4 day stay at the hospital. She wanted to know if Dr. Darrick Huntsman would be involved in the discussion of her bill, I advised patient that she would need to talk with Katrina, she then said "maybe we just need to get an attorney involved and make sure Dr. Darrick Huntsman knows this".

## 2011-11-25 NOTE — Telephone Encounter (Signed)
Son called back and I was able to give him the information for Jodi Colon.  He wanted to know why his appointment with Dr. Darrick Huntsman was cancelled and why she wouldn't see him. I advised him that it wasn't that Dr. Darrick Huntsman wouldn't see him, but that I had contacted one of our resources to give US guidance.  He agreed to call Jodi Colon and I also sent her an email with his name and contact information.  He said the reason why he wanted Dr. Darrick Huntsman in the meeting is so that she would address her staff of the way they mishandled his mother.  He stated that when his mother called in for an appointment she was never transferred to a nurse.  I advised him that Jodi Colon could help if he would give her a call.

## 2011-11-26 ENCOUNTER — Ambulatory Visit: Payer: 59 | Admitting: Internal Medicine

## 2011-11-26 DIAGNOSIS — L98499 Non-pressure chronic ulcer of skin of other sites with unspecified severity: Secondary | ICD-10-CM | POA: Insufficient documentation

## 2011-11-29 ENCOUNTER — Ambulatory Visit: Payer: Self-pay | Admitting: Pain Medicine

## 2011-11-29 NOTE — Telephone Encounter (Signed)
Erick called on Friday 11/26/11 and left voicemail asking me to return his call.  He stated that he had been talking with [facility] and was told that Dr.Tullo charged his mom for an office visit of $285.00, he doesn't feel that giving the situation that his mother should have been billed by Dr. Darrick Huntsman.  I returned his call this morning and he again stated that he was surprised that giving the situation that Dr. Darrick Huntsman would have billed his mother for the office visit on 10/08/11.  I explained that Dr. Darrick Huntsman did see his mother and that she does charge for office visits.  He stated that he hadn't heard from Katrina other than she did leave him a message and stated that she did want to hear his concerns.  He stated that he would discuss it with Katrina since that was the next step.

## 2011-12-23 ENCOUNTER — Ambulatory Visit: Payer: Self-pay | Admitting: Pain Medicine

## 2011-12-30 DIAGNOSIS — Z8614 Personal history of Methicillin resistant Staphylococcus aureus infection: Secondary | ICD-10-CM | POA: Insufficient documentation

## 2011-12-30 DIAGNOSIS — Z8619 Personal history of other infectious and parasitic diseases: Secondary | ICD-10-CM | POA: Insufficient documentation

## 2012-01-20 ENCOUNTER — Ambulatory Visit: Payer: Self-pay | Admitting: Pain Medicine

## 2012-02-02 ENCOUNTER — Ambulatory Visit: Payer: Self-pay | Admitting: Pain Medicine

## 2012-02-14 ENCOUNTER — Other Ambulatory Visit: Payer: Self-pay | Admitting: Internal Medicine

## 2012-02-14 MED ORDER — DULOXETINE HCL 60 MG PO CPEP: 60.0000 mg | ORAL_CAPSULE | Freq: Every day | ORAL | Status: DC

## 2012-02-22 ENCOUNTER — Ambulatory Visit: Payer: Self-pay | Admitting: Pain Medicine

## 2012-02-28 DIAGNOSIS — N3941 Urge incontinence: Secondary | ICD-10-CM | POA: Insufficient documentation

## 2012-02-28 DIAGNOSIS — D090 Carcinoma in situ of bladder: Secondary | ICD-10-CM | POA: Insufficient documentation

## 2012-02-28 DIAGNOSIS — R351 Nocturia: Secondary | ICD-10-CM | POA: Insufficient documentation

## 2012-02-28 DIAGNOSIS — R350 Frequency of micturition: Secondary | ICD-10-CM | POA: Insufficient documentation

## 2012-02-28 DIAGNOSIS — Z8603 Personal history of neoplasm of uncertain behavior: Secondary | ICD-10-CM | POA: Insufficient documentation

## 2012-03-03 NOTE — Telephone Encounter (Signed)
I am closing this encounter since Erick has been discussing this with Katrina who is our Risk management representative.

## 2012-03-29 ENCOUNTER — Ambulatory Visit: Payer: Self-pay | Admitting: Pain Medicine

## 2012-04-19 ENCOUNTER — Ambulatory Visit: Payer: Self-pay | Admitting: Pain Medicine

## 2012-05-18 ENCOUNTER — Ambulatory Visit: Payer: Self-pay | Admitting: Pain Medicine

## 2012-07-04 ENCOUNTER — Ambulatory Visit: Payer: Self-pay | Admitting: Internal Medicine

## 2012-07-11 ENCOUNTER — Ambulatory Visit: Payer: Self-pay | Admitting: Pain Medicine

## 2012-07-12 DIAGNOSIS — Z1239 Encounter for other screening for malignant neoplasm of breast: Secondary | ICD-10-CM | POA: Insufficient documentation

## 2012-07-19 ENCOUNTER — Ambulatory Visit: Payer: Self-pay | Admitting: Pain Medicine

## 2012-08-15 ENCOUNTER — Ambulatory Visit: Payer: Self-pay | Admitting: Internal Medicine

## 2012-08-29 DIAGNOSIS — K649 Unspecified hemorrhoids: Secondary | ICD-10-CM | POA: Insufficient documentation

## 2012-09-05 ENCOUNTER — Ambulatory Visit: Payer: Self-pay | Admitting: Pain Medicine

## 2012-09-08 ENCOUNTER — Encounter: Payer: Self-pay | Admitting: General Surgery

## 2012-09-08 ENCOUNTER — Ambulatory Visit (INDEPENDENT_AMBULATORY_CARE_PROVIDER_SITE_OTHER): Payer: 59 | Admitting: General Surgery

## 2012-09-08 VITALS — BP 120/74 | HR 64 | Resp 14 | Ht <= 58 in | Wt 122.0 lb

## 2012-09-08 DIAGNOSIS — K649 Unspecified hemorrhoids: Secondary | ICD-10-CM

## 2012-09-08 DIAGNOSIS — K623 Rectal prolapse: Secondary | ICD-10-CM

## 2012-09-08 MED ORDER — HYDROCORTISONE ACETATE 25 MG RE SUPP: 25.0000 mg | Freq: Two times a day (BID) | RECTAL | Status: DC

## 2012-09-08 NOTE — Patient Instructions (Addendum)
Patient advised that she need to use specific exercises to help improve rectal strengthening. She is advised that surgery is not necessary at this time. Patient to use medicated suppositories that will be sent to her pharmacy. She is instructed to use suppositories twice a day for the first box and once a day for the second box. Exercises will be mailed to her home.

## 2012-09-08 NOTE — Progress Notes (Signed)
Patient ID: Jodi Colon, female   DOB: [birth year], 79 y.o.   MRN: [id number]  Chief Complaint  Patient presents with  . Other    Rectal Prolapse    HPI Jodi Colon is a 79 y.o. female who presents for rectal prolapse. This has been a problem for approximately 1 year. She complains for rectal bleeding as well as rectal itching. This problem is has been getting worse and she is ready to take care of the problem. HPI  Past Medical History  Diagnosis Date  . Degenerative joint disease     [facility]  . Hypertension   . Hyperlipidemia   . MRSA (methicillin resistant Staphylococcus aureus) 2008    perineal abscess  . Brain aneurysm     small, followed with regular angiograms, Dr. Corliss Skains  . Spinal stenosis   . DCIS (ductal carcinoma in situ) of breast 2007    Right, s/p lumpectomy/xrt   . Cerebrovascular disease 2008    s/p PTCA/stent of MCA, Deveshwar  . History of MRSA infection     Past Surgical History  Procedure Laterality Date  . Cholecystectomy    . Colonoscopy  2013    Family History  Problem Relation Age of Onset  . Fibromyalgia Daughter     Social History History  Substance Use Topics  . Smoking status: Never Smoker   . Smokeless tobacco: Never Used  . Alcohol Use: No    Allergies  Allergen Reactions  . Penicillins     Rash  . Prednisone     Patient reported this date that she was allergic to PCN, rather than Prednisone.   . Septra (Bactrim)   . Sulfa Antibiotics     Current Outpatient Prescriptions  Medication Sig Dispense Refill  . aspirin 81 MG tablet Take 81 mg by mouth daily.      . Calcium Carbonate-Vitamin D (CALCIUM + D) 600-200 MG-UNIT TABS Take by mouth.        . clopidogrel (PLAVIX) 75 MG tablet Take 1 tablet (75 mg total) by mouth daily.  30 tablet  3  . gabapentin (NEURONTIN) 100 MG capsule Take 100 mg by mouth 3 (three) times daily.        Marland Kitchen HYDROcodone-acetaminophen (VICODIN) 5-500 MG per tablet Take 1 tablet by  mouth every 6 (six) hours as needed.        Marland Kitchen lisinopril (PRINIVIL,ZESTRIL) 40 MG tablet Take 1 tablet (40 mg total) by mouth daily.  30 tablet  10  . Multiple Vitamin (MULTIVITAMIN) tablet Take 1 tablet by mouth daily.        . niacin (NIASPAN) 500 MG CR tablet Take 1 tablet (500 mg total) by mouth at bedtime.  90 tablet  3  . nystatin (MYCOSTATIN) 100000 UNIT/ML suspension Swish in mouth and swallow one teaspoonful four times a day.  150 mL  0  . pantoprazole (PROTONIX) 40 MG tablet Take 1 tablet (40 mg total) by mouth 2 (two) times daily.  60 tablet  11  . PARoxetine (PAXIL) 20 MG tablet Take 20 mg by mouth every morning.        . topiramate (TOPAMAX) 25 MG tablet Take 25 mg by mouth. 3 tablets at bedtime       . atorvastatin (LIPITOR) 80 MG tablet Take 1 tablet (80 mg total) by mouth daily.  30 tablet  3  . DULoxetine (CYMBALTA) 60 MG capsule Take 1 capsule (60 mg total) by mouth daily.  30 capsule  6  . hydrocortisone (ANUSOL-HC) 25 MG suppository Place 1 suppository (25 mg total) rectally 2 (two) times daily. One suppository twice a day for 6 days, then daily for 12 days.  24 suppository  0   No current facility-administered medications for this visit.    Review of Systems Review of Systems  Constitutional: Negative.   Respiratory: Negative.   Cardiovascular: Negative.   Gastrointestinal: Positive for anal bleeding.    Blood pressure 120/74, pulse 64, resp. rate 14, height 4\' 9"  (1.448 m), weight 122 lb (55.339 kg).  Physical Exam Physical Exam  Constitutional: She appears well-developed and well-nourished.  Neck: Trachea normal. No mass and no thyromegaly present.  Cardiovascular: Normal rate, regular rhythm, normal heart sounds and normal pulses.   No murmur heard. Pulmonary/Chest: Effort normal and breath sounds normal.  Abdominal: Soft. Bowel sounds are normal. There is no hepatosplenomegaly. There is no tenderness. No hernia.  Perineal exam shows slightly prominent  perianal skin tissue without significant inflammation. With vigorous straining there is prolapse of internal hemorrhoids, but no visible rectal mucosa.  Digital examination shows a moderately lax anal sphincter. No palpable masses are appreciated.  Stool is Hemoccult negative.  Data Reviewed None available  Assessment    Prolapsing internal hemorrhoids with mild inflammation. Mildly lax anal sphincter.    Plan    There is no frank rectal prolapse at present, and the patient would not be a candidate for surgical intervention at this time. The modestly swollen internal hemorrhoids may be a lead point for prolapse, and have recommended that she make use of Anusol-HC suppositories twice a day for 6 days and then daily for 12 days to see if the reduced inflammation will help relieve her occasional seepage and rectal bleeding.  Arrangements are in place for a followup examination in one month.       Jodi Colon 09/08/2012, 12:43 PM

## 2012-09-28 ENCOUNTER — Ambulatory Visit: Payer: Self-pay | Admitting: Pain Medicine

## 2012-10-02 ENCOUNTER — Ambulatory Visit: Payer: Self-pay | Admitting: Pain Medicine

## 2012-10-10 ENCOUNTER — Ambulatory Visit: Payer: 59 | Admitting: General Surgery

## 2012-11-02 ENCOUNTER — Ambulatory Visit: Payer: Self-pay | Admitting: Pain Medicine

## 2012-11-27 ENCOUNTER — Encounter: Payer: Self-pay | Admitting: General Surgery

## 2012-11-27 ENCOUNTER — Ambulatory Visit (INDEPENDENT_AMBULATORY_CARE_PROVIDER_SITE_OTHER): Payer: 59 | Admitting: General Surgery

## 2012-11-27 VITALS — BP 120/80 | HR 70 | Resp 14 | Ht <= 58 in | Wt 125.0 lb

## 2012-11-27 DIAGNOSIS — L02818 Cutaneous abscess of other sites: Secondary | ICD-10-CM

## 2012-11-27 DIAGNOSIS — K649 Unspecified hemorrhoids: Secondary | ICD-10-CM

## 2012-11-27 DIAGNOSIS — K623 Rectal prolapse: Secondary | ICD-10-CM | POA: Insufficient documentation

## 2012-11-27 DIAGNOSIS — L989 Disorder of the skin and subcutaneous tissue, unspecified: Secondary | ICD-10-CM | POA: Insufficient documentation

## 2012-11-27 NOTE — Progress Notes (Signed)
Patient ID: Jodi Colon, female   DOB: March 27, 1930, 79 y.o.   MRN: [id number]  Chief Complaint  Patient presents with  . Follow-up    rectal abscess    HPI Jodi Colon is a 79 y.o. female one month follow up for rectal prolapse. She reports the area has not improved Patient states she has developed several sores of her scalp. One is currently draining. Patient has personal hx of MRSA infections in the past. The patient was begun on doxycycline last week by her primary care provider.  She still had a discomfort in the anal area as well as intermittent bleeding. Anusol-HC suppositories route of benefit. HPI  Past Medical History  Diagnosis Date  . Degenerative joint disease     [facility]  . Hypertension   . Hyperlipidemia   . MRSA (methicillin resistant Staphylococcus aureus) 2008    perineal abscess  . Brain aneurysm     small, followed with regular angiograms, Dr. Corliss Skains  . Spinal stenosis   . DCIS (ductal carcinoma in situ) of breast 2007    Right, s/p lumpectomy/xrt   . Cerebrovascular disease 2008    s/p PTCA/stent of MCA, Deveshwar  . History of MRSA infection     Past Surgical History  Procedure Laterality Date  . Cholecystectomy    . Colonoscopy  2013    Family History  Problem Relation Age of Onset  . Fibromyalgia Daughter     Social History History  Substance Use Topics  . Smoking status: Never Smoker   . Smokeless tobacco: Never Used  . Alcohol Use: No    Allergies  Allergen Reactions  . Penicillins     Rash  . Prednisone     Patient reported this date that she was allergic to PCN, rather than Prednisone.   . Septra (Bactrim)   . Sulfa Antibiotics     Current Outpatient Prescriptions  Medication Sig Dispense Refill  . aspirin 81 MG tablet Take 81 mg by mouth daily.      . Calcium Carbonate-Vitamin D (CALCIUM + D) 600-200 MG-UNIT TABS Take by mouth.        . clopidogrel (PLAVIX) 75 MG tablet Take 1 tablet (75 mg total) by  mouth daily.  30 tablet  3  . doxycycline (VIBRAMYCIN) 100 MG capsule Take 100 mg by mouth 2 (two) times daily. Take 1 capsule (100 mg total) by mouth 2 (two) times daily.      . DULoxetine (CYMBALTA) 60 MG capsule Take 1 capsule (60 mg total) by mouth daily.  30 capsule  6  . gabapentin (NEURONTIN) 100 MG capsule Take 100 mg by mouth 3 (three) times daily.        Marland Kitchen HYDROcodone-acetaminophen (VICODIN) 5-500 MG per tablet Take 1 tablet by mouth every 6 (six) hours as needed.        . hydrocortisone (ANUSOL-HC) 25 MG suppository Place 1 suppository (25 mg total) rectally 2 (two) times daily. One suppository twice a day for 6 days, then daily for 12 days.  24 suppository  0  . lisinopril (PRINIVIL,ZESTRIL) 40 MG tablet Take 1 tablet (40 mg total) by mouth daily.  30 tablet  10  . Multiple Vitamin (MULTIVITAMIN) tablet Take 1 tablet by mouth daily.        . niacin (NIASPAN) 500 MG CR tablet Take 1 tablet (500 mg total) by mouth at bedtime.  90 tablet  3  . nystatin (MYCOSTATIN) 100000 UNIT/ML suspension Swish in  mouth and swallow one teaspoonful four times a day.  150 mL  0  . pantoprazole (PROTONIX) 40 MG tablet Take 1 tablet (40 mg total) by mouth 2 (two) times daily.  60 tablet  11  . PARoxetine (PAXIL) 20 MG tablet Take 20 mg by mouth every morning.        . topiramate (TOPAMAX) 25 MG tablet Take 25 mg by mouth. 3 tablets at bedtime       . atorvastatin (LIPITOR) 80 MG tablet Take 1 tablet (80 mg total) by mouth daily.  30 tablet  3   No current facility-administered medications for this visit.    Review of Systems Review of Systems  HENT: Negative.   Respiratory: Negative.   Cardiovascular: Negative.     Blood pressure 120/80, pulse 70, resp. rate 14, height 4\' 9"  (1.448 m), weight 125 lb (56.7 kg).  Physical Exam Physical Exam  Constitutional: She is oriented to person, place, and time. She appears well-developed and well-nourished.  Pulmonary/Chest: Effort normal and breath sounds  normal.  Neurological: She is alert and oriented to person, place, and time.  Anal examination showed prolapsing internal hemorrhoids with vigorous straining. She has some loss of tone of the anal sphincter, but frank mucosal prolapse is not identified on today's exam. Examination of the scalp shows a inflamed area on the posterior right parietal near the midline with multiple small punctate openings.  Data Reviewed 3 cc of 1% Xylocaine with one 100,000 units of epinephrine was utilized after poor prep. A 1 cm incision was made and a small amount of purulent material was returned. Culture was obtained. The cavity was swabbed. A small amount of bacitracin ointment was applied. Scant bleeding was noted. The patient tolerated the procedure well.  Assessment    Prolapsing internal hemorrhoids, questionable rectal mucosal prolapse.  Scalp abscess, probable MRSA.     Plan    The patient was instructed in regards to wound care. Follow up will be scheduled based on her scalp lesion progress.        [name] 11/27/2012, 9:15 PM

## 2012-11-27 NOTE — Patient Instructions (Addendum)
ASA

## 2012-12-01 LAB — ANAEROBIC AND AEROBIC CULTURE

## 2012-12-26 ENCOUNTER — Ambulatory Visit: Payer: Self-pay | Admitting: Pain Medicine

## 2013-01-04 ENCOUNTER — Telehealth: Payer: Self-pay | Admitting: *Deleted

## 2013-01-04 NOTE — Telephone Encounter (Signed)
Phone call from patient asking if any decisions have been made regarding hemorrhoid surgery, that Dr Lemar Livings was checking with [facility]. I went ahead and made an appointment for January 24 2013

## 2013-01-10 ENCOUNTER — Ambulatory Visit: Payer: Self-pay | Admitting: Pain Medicine

## 2013-01-24 ENCOUNTER — Encounter: Payer: Self-pay | Admitting: General Surgery

## 2013-01-24 ENCOUNTER — Ambulatory Visit (INDEPENDENT_AMBULATORY_CARE_PROVIDER_SITE_OTHER): Payer: Medicare Other | Admitting: General Surgery

## 2013-01-24 ENCOUNTER — Other Ambulatory Visit: Payer: Self-pay | Admitting: General Surgery

## 2013-01-24 VITALS — BP 114/56 | HR 74 | Resp 12 | Ht <= 58 in | Wt 122.0 lb

## 2013-01-24 DIAGNOSIS — K644 Residual hemorrhoidal skin tags: Secondary | ICD-10-CM

## 2013-01-24 DIAGNOSIS — K641 Second degree hemorrhoids: Secondary | ICD-10-CM

## 2013-01-24 DIAGNOSIS — K648 Other hemorrhoids: Secondary | ICD-10-CM

## 2013-01-24 NOTE — Patient Instructions (Addendum)
Hemorrhoidectomy Hemorrhoidectomy is surgery to remove hemorrhoids. Hemorrhoids are veins that have become swollen in the rectum. The rectum is the area from the bottom end of the intestines to the opening where bowel movements leave the body. Hemorrhoids can be uncomfortable. They can cause itching, bleeding and pain if a blood clot forms in them (thrombose). If hemorrhoids are small, surgery may not be needed. But if they cover a larger area, surgery is usually suggested.  LET YOUR CAREGIVER KNOW ABOUT:   Any allergies.  All medications you are taking, including:  Herbs, eyedrops, over-the-counter medications and creams.  Blood thinners (anticoagulants), aspirin or other drugs that could affect blood clotting.  Use of steroids (by mouth or as creams).  Previous problems with anesthetics, including local anesthetics.  Possibility of pregnancy, if this applies.  Any history of blood clots.  Any history of bleeding or other blood problems.  Previous surgery.  Smoking history.  Other health problems. RISKS AND COMPLICATIONS All surgery carries some risk. However, hemorrhoid surgery usually goes smoothly. Possible complications could include:  Urinary retention.  Bleeding.  Infection.  A painful incision.  A reaction to the anesthesia (this is not common). BEFORE THE PROCEDURE   Stop using aspirin and non-steroidal anti-inflammatory drugs (NSAIDs) for pain relief. This includes prescription drugs and over-the-counter drugs such as ibuprofen and naproxen. Also stop taking vitamin E. If possible, do this two weeks before your surgery.  If you take blood-thinners, ask your healthcare provider when you should stop taking them.  You will probably have blood and urine tests done several days before your surgery.  Do not eat or drink for about 8 hours before the surgery.  Arrive at least an hour before the surgery, or whenever your surgeon recommends. This will give you time to  check in and fill out any needed paperwork.  Hemorrhoidectomy is often an outpatient procedure. This means you will be able to go home the same day. Sometimes, though, people stay overnight in the hospital after the procedure. Ask your surgeon what to expect. Either way, make arrangements in advance for someone to drive you home. PROCEDURE   The preparation:  You will change into a hospital gown.  You will be given an IV. A needle will be inserted in your arm. Medication can flow directly into your body through this needle.  You might be given an enema to clear your rectum.  Once in the operating room, you will probably lie on your side or be repositioned later to lying on your stomach.  You will be given anesthesia (medication) so you will not feel anything during the surgery. The surgery often is done with local anesthesia (the area near the hemorrhoids will be numb and you will be drowsy but awake). Sometimes, general anesthesia is used (you will be asleep during the procedure).  The procedure:  There are a few different procedures for hemorrhoids. Be sure to ask you surgeon about the procedure, the risks and benefits.  Be sure to ask about what you need to do to take care of the wound, if there is one. AFTER THE PROCEDURE  You will stay in a recovery area until the anesthesia has worn off. Your blood pressure and pulse will be checked every so often.  You may feel a lot of pain in the area of the rectum.  Take all pain medication prescribed by your surgeon. Ask before taking any over-the-counter pain medicines.  Sometimes sitting in a warm bath can help relieve   your pain.  To make sure you have bowel movements without straining:  You will probably need to take stool softeners (usually a pill) for a few days.  You should drink 8 to 10 glasses of water each day.  Your activity will be restricted for awhile. Ask your caregiver for a list of what you should and should not do  while you recover. Document Released: 03/14/2009 Document Revised: 08/09/2011 Document Reviewed: 03/14/2009 [facility] Patient Information 2014 [place], [place].  Patient's surgery has been scheduled for 02-01-13 at [facility]. This patient has been asked to stop Plavix 7 days prior to procedure. It is okay to continue 81 mg aspirin.

## 2013-01-24 NOTE — Progress Notes (Signed)
Patient ID: Jodi Colon, female   DOB: 1929/11/05, 79 y.o.   MRN: [id number]  Chief Complaint  Patient presents with  . Follow-up    hemorrhoids    HPI Jodi Colon is a 79 y.o. female.  Patient here today for follow up hemorrhoids.  States she still notices some bleeding with BM but not any today. Wants to discuss surgery.  The patient was seen in April, 2014 and placed on a b.i.d. Regimen of Anusol-HC suppositories. She is still experiencing bleeding with both bowel movements. She does not report the need to manually reduce tissue after bowel movements. She denies much pain.  The scalp lesion treated by excision on her last visit has healed well.   HPI  Past Medical History  Diagnosis Date  . Degenerative joint disease     [facility]  . Hypertension   . Hyperlipidemia   . MRSA (methicillin resistant Staphylococcus aureus) 2008    perineal abscess  . Brain aneurysm     small, followed with regular angiograms, Dr. Corliss Skains  . Spinal stenosis   . DCIS (ductal carcinoma in situ) of breast 2007    Right, s/p lumpectomy/xrt   . Cerebrovascular disease 2008    s/p PTCA/stent of MCA, Deveshwar  . History of MRSA infection     Past Surgical History  Procedure Laterality Date  . Cholecystectomy    . Colonoscopy  2013    Family History  Problem Relation Age of Onset  . Fibromyalgia Daughter     Social History History  Substance Use Topics  . Smoking status: Never Smoker   . Smokeless tobacco: Never Used  . Alcohol Use: No    Allergies  Allergen Reactions  . Prednisone     Patient reported this date that she was allergic to PCN, rather than Prednisone.   . Septra [Bactrim]   . Sulfa Antibiotics   . Penicillins Rash    Rash    Current Outpatient Prescriptions  Medication Sig Dispense Refill  . aspirin 81 MG tablet Take 81 mg by mouth daily.      Marland Kitchen atorvastatin (LIPITOR) 80 MG tablet Take 1 tablet (80 mg total) by mouth daily.  30 tablet  3   . Calcium Carbonate-Vitamin D (CALCIUM + D) 600-200 MG-UNIT TABS Take by mouth.        . clopidogrel (PLAVIX) 75 MG tablet Take 1 tablet (75 mg total) by mouth daily.  30 tablet  3  . DULoxetine (CYMBALTA) 60 MG capsule Take 1 capsule (60 mg total) by mouth daily.  30 capsule  6  . gabapentin (NEURONTIN) 100 MG capsule Take 100 mg by mouth 3 (three) times daily.        Marland Kitchen HYDROcodone-acetaminophen (VICODIN) 5-500 MG per tablet Take 1 tablet by mouth every 6 (six) hours as needed.        . hydrocortisone (ANUSOL-HC) 25 MG suppository Place 1 suppository (25 mg total) rectally 2 (two) times daily. One suppository twice a day for 6 days, then daily for 12 days.  24 suppository  0  . lisinopril (PRINIVIL,ZESTRIL) 40 MG tablet Take 1 tablet (40 mg total) by mouth daily.  30 tablet  10  . Multiple Vitamin (MULTIVITAMIN) tablet Take 1 tablet by mouth daily.        . niacin (NIASPAN) 500 MG CR tablet Take 1 tablet (500 mg total) by mouth at bedtime.  90 tablet  3  . nystatin (MYCOSTATIN) 100000 UNIT/ML suspension Swish  in mouth and swallow one teaspoonful four times a day.  150 mL  0  . pantoprazole (PROTONIX) 40 MG tablet Take 1 tablet (40 mg total) by mouth 2 (two) times daily.  60 tablet  11  . PARoxetine (PAXIL) 20 MG tablet Take 20 mg by mouth every morning.        . topiramate (TOPAMAX) 25 MG tablet Take 25 mg by mouth.       . zolpidem (AMBIEN) 5 MG tablet Take by mouth. Take 5 mg by mouth nightly as needed for Sleep. Dr. crisp       No current facility-administered medications for this visit.    Review of Systems Review of Systems  Constitutional: Negative.   Respiratory: Negative.   Cardiovascular: Negative.   Gastrointestinal: Negative for vomiting, diarrhea and constipation.    Blood pressure 114/56, pulse 74, resp. rate 12, height 4\' 9"  (1.448 m), weight 122 lb (55.339 kg).  Physical Exam Physical Exam  Constitutional: She is oriented to person, place, and time. She appears  well-developed and well-nourished.  Cardiovascular: Normal rate and regular rhythm.   Pulmonary/Chest: Effort normal and breath sounds normal.  Genitourinary: Rectal exam shows external hemorrhoid and internal hemorrhoid.  Sphincter tone is modestly depressed, but a reasonable "squeezed" is appreciated. The only prolapsing tissue appears to represent prominent internal hemorrhoidal tissue. Squeezing the base of this tissue with the clamp results in significant pain suggesting there is an external component as well. No active bleeding.  Neurological: She is alert and oriented to person, place, and time.  Skin: Skin is warm and dry.    Data Reviewed None.  Assessment    Symptomatic hemorrhoids. No clear evidence of rectal prolapse at this time.     Plan    The patient may be a candidate for a stapled hemorrhoid pexy, or she may simply require a standard Ferguson hemorrhoidectomy. It will be better to assess this intraoperatively, rather than make a decision today.  The patient was advised that if the staple technique is undertaken, she may still appreciated external skin tags, that should resolve with time. The possibility for infection, bleeding and difficulties with continence were reviewed.    Patient's surgery has been scheduled for 02-01-13 at [facility]. This patient has been asked to stop Plavix 7 days prior to procedure. It is okay to continue 81 mg aspirin.   [name] 01/24/2013, 9:39 PM

## 2013-01-25 ENCOUNTER — Ambulatory Visit: Payer: Self-pay | Admitting: General Surgery

## 2013-01-25 ENCOUNTER — Telehealth: Payer: Self-pay | Admitting: *Deleted

## 2013-01-25 NOTE — Telephone Encounter (Signed)
Leah in O.R. notified as directed. O.R. will contact Darnell Level and notify him of case. Leah asked if he needed to be present for case and I told her I wasn't sure and that you just said for him to be notified. Let me know if this is different. Thanks.

## 2013-01-25 NOTE — Telephone Encounter (Signed)
Patient reports that she sees Dr. Leim Fabry for primary care now. This has been updated in EPIC. Thanks.

## 2013-01-25 NOTE — Telephone Encounter (Signed)
-----   Message from [name], MD sent at 01/24/2013 9:45 PM ----- Please find out who the patient's new PCP is. Thank you  Message left for patient to call the office.

## 2013-01-25 NOTE — Telephone Encounter (Signed)
Message copied by Nicholes Mango on Thu Jan 25, 2013  9:17 AM ------      Message from: [place], [place] W      Created: Wed Jan 24, 2013  9:45 PM       Ethicon hemorrhoid stapler.  Notify Darnell Level of case. Thanks. ------

## 2013-02-01 ENCOUNTER — Ambulatory Visit: Payer: Self-pay | Admitting: General Surgery

## 2013-02-01 DIAGNOSIS — K649 Unspecified hemorrhoids: Secondary | ICD-10-CM

## 2013-02-01 HISTORY — PX: HEMORRHOID SURGERY: SHX153

## 2013-02-02 ENCOUNTER — Encounter: Payer: Self-pay | Admitting: General Surgery

## 2013-02-02 LAB — PATHOLOGY REPORT

## 2013-02-12 ENCOUNTER — Encounter: Payer: Self-pay | Admitting: General Surgery

## 2013-02-13 ENCOUNTER — Ambulatory Visit (INDEPENDENT_AMBULATORY_CARE_PROVIDER_SITE_OTHER): Payer: Medicare Other | Admitting: General Surgery

## 2013-02-13 ENCOUNTER — Ambulatory Visit: Payer: Self-pay | Admitting: Pain Medicine

## 2013-02-13 ENCOUNTER — Encounter: Payer: Self-pay | Admitting: General Surgery

## 2013-02-13 ENCOUNTER — Ambulatory Visit: Payer: 59 | Admitting: General Surgery

## 2013-02-13 VITALS — BP 102/58 | HR 78 | Resp 12 | Ht <= 58 in | Wt 122.0 lb

## 2013-02-13 DIAGNOSIS — K641 Second degree hemorrhoids: Secondary | ICD-10-CM

## 2013-02-13 DIAGNOSIS — K649 Unspecified hemorrhoids: Secondary | ICD-10-CM

## 2013-02-13 NOTE — Progress Notes (Signed)
Patient ID: Jodi Colon, female   DOB: 11/15/1929, 79 y.o.   MRN: [id number]  Chief Complaint  Patient presents with  . Routine Post Op    hemorrhoidectomy    HPI Jodi Colon is a 79 y.o. female here today for her post op hemorrhoidectomy done on 02/01/13. States she is getting along well no bleeding or rectal pain. She states she is very pleased with her progress. HPI  Past Medical History  Diagnosis Date  . Degenerative joint disease     [facility]  . Hypertension   . Hyperlipidemia   . MRSA (methicillin resistant Staphylococcus aureus) 2008    perineal abscess  . Brain aneurysm     small, followed with regular angiograms, Dr. Corliss Skains  . Spinal stenosis   . DCIS (ductal carcinoma in situ) of breast 2007    Right, s/p lumpectomy/xrt   . Cerebrovascular disease 2008    s/p PTCA/stent of MCA, Deveshwar  . History of MRSA infection     Past Surgical History  Procedure Laterality Date  . Cholecystectomy    . Colonoscopy  2013  . Hemorrhoid surgery  02/01/13    Family History  Problem Relation Age of Onset  . Fibromyalgia Daughter     Social History History  Substance Use Topics  . Smoking status: Never Smoker   . Smokeless tobacco: Never Used  . Alcohol Use: No    Allergies  Allergen Reactions  . Prednisone     Patient reported this date that she was allergic to PCN, rather than Prednisone.   . Septra [Bactrim]   . Sulfa Antibiotics   . Penicillins Rash    Rash    Current Outpatient Prescriptions  Medication Sig Dispense Refill  . aspirin 81 MG tablet Take 81 mg by mouth daily.      . Calcium Carbonate-Vitamin D (CALCIUM + D) 600-200 MG-UNIT TABS Take by mouth.        . clopidogrel (PLAVIX) 75 MG tablet Take 1 tablet (75 mg total) by mouth daily.  30 tablet  3  . DULoxetine (CYMBALTA) 60 MG capsule Take 1 capsule (60 mg total) by mouth daily.  30 capsule  6  . gabapentin (NEURONTIN) 100 MG capsule Take 100 mg by mouth 3 (three) times  daily.        Marland Kitchen HYDROcodone-acetaminophen (VICODIN) 5-500 MG per tablet Take 1 tablet by mouth every 6 (six) hours as needed.        . hydrocortisone (ANUSOL-HC) 25 MG suppository Place 1 suppository (25 mg total) rectally 2 (two) times daily. One suppository twice a day for 6 days, then daily for 12 days.  24 suppository  0  . lisinopril (PRINIVIL,ZESTRIL) 40 MG tablet Take 1 tablet (40 mg total) by mouth daily.  30 tablet  10  . Multiple Vitamin (MULTIVITAMIN) tablet Take 1 tablet by mouth daily.        . niacin (NIASPAN) 500 MG CR tablet Take 1 tablet (500 mg total) by mouth at bedtime.  90 tablet  3  . nystatin (MYCOSTATIN) 100000 UNIT/ML suspension Swish in mouth and swallow one teaspoonful four times a day.  150 mL  0  . pantoprazole (PROTONIX) 40 MG tablet Take 1 tablet (40 mg total) by mouth 2 (two) times daily.  60 tablet  11  . PARoxetine (PAXIL) 20 MG tablet Take 20 mg by mouth every morning.        . topiramate (TOPAMAX) 25 MG tablet Take  25 mg by mouth.       . zolpidem (AMBIEN) 5 MG tablet Take by mouth. Take 5 mg by mouth nightly as needed for Sleep. Dr. Metta Clines      . atorvastatin (LIPITOR) 80 MG tablet Take 1 tablet (80 mg total) by mouth daily.  30 tablet  3   No current facility-administered medications for this visit.    Review of Systems Review of Systems  Constitutional: Negative.   Respiratory: Negative.   Cardiovascular: Negative.     Blood pressure 102/58, pulse 78, resp. rate 12, height 4\' 9"  (1.448 m), weight 122 lb (55.339 kg).  Physical Exam Physical Exam  Constitutional: She is oriented to person, place, and time. She appears well-developed and well-nourished.  Genitourinary:  External skin tag at 5 o'clock area  Neurological: She is alert and oriented to person, place, and time.  Skin: Skin is warm and dry.  With vigorous straining no prolapsing tissue is identified.  Data Reviewed Pathology showed benign hemorrhoidal tissue.  Assessment    Doing  well status post stapled hemorrhoidectomy for internal hemorrhoids with mild rectal mucosal prolapse.     Plan    The patient will follow up as needed.        [name] 02/15/2013, 5:30 AM

## 2013-02-13 NOTE — Patient Instructions (Addendum)
The patient is aware to call back for any questions or concerns.  

## 2013-02-15 ENCOUNTER — Encounter: Payer: Self-pay | Admitting: General Surgery

## 2013-03-07 ENCOUNTER — Ambulatory Visit: Payer: Self-pay | Admitting: Urology

## 2013-03-07 LAB — BASIC METABOLIC PANEL
Anion Gap: 4 — ABNORMAL LOW (ref 7–16)
BUN: 12 mg/dL (ref 7–18)
Calcium, Total: 9.2 mg/dL (ref 8.5–10.1)
Chloride: 97 mmol/L — ABNORMAL LOW (ref 98–107)
Co2: 29 mmol/L (ref 21–32)
Creatinine: 0.88 mg/dL (ref 0.60–1.30)
EGFR (African American): >60
EGFR (Non-African Amer.): >60
Glucose: 83 mg/dL (ref 65–99)
Osmolality: 260 (ref 275–301)
Potassium: 4.5 mmol/L (ref 3.5–5.1)
Sodium: 130 mmol/L — ABNORMAL LOW (ref 136–145)

## 2013-03-07 LAB — CBC WITH DIFFERENTIAL/PLATELET
Basophil #: 0 10*3/uL (ref 0.0–0.1)
Basophil %: 0.9 %
Eosinophil #: 0.3 10*3/uL (ref 0.0–0.7)
Eosinophil %: 6.4 %
HCT: 35 % (ref 35.0–47.0)
HGB: 12.2 g/dL (ref 12.0–16.0)
Lymphocyte #: 1.6 10*3/uL (ref 1.0–3.6)
Lymphocyte %: 31.3 %
MCH: 32.4 pg (ref 26.0–34.0)
MCHC: 34.9 g/dL (ref 32.0–36.0)
MCV: 93 fL (ref 80–100)
Monocyte #: 0.6 x10 3/mm (ref 0.2–0.9)
Monocyte %: 11.1 %
Neutrophil #: 2.6 10*3/uL (ref 1.4–6.5)
Neutrophil %: 50.3 %
Platelet: 183 10*3/uL (ref 150–440)
RBC: 3.78 10*6/uL — ABNORMAL LOW (ref 3.80–5.20)
RDW: 12.8 % (ref 11.5–14.5)
WBC: 5.1 10*3/uL (ref 3.6–11.0)

## 2013-03-08 ENCOUNTER — Ambulatory Visit: Payer: Self-pay | Admitting: Urology

## 2013-03-10 LAB — URINE CULTURE

## 2013-03-14 ENCOUNTER — Ambulatory Visit: Payer: Self-pay | Admitting: Urology

## 2013-03-15 LAB — PATHOLOGY REPORT

## 2013-03-20 ENCOUNTER — Inpatient Hospital Stay: Payer: Self-pay | Admitting: Specialist

## 2013-03-20 LAB — URINALYSIS, COMPLETE
Bilirubin,UR: NEGATIVE
Glucose,UR: NEGATIVE mg/dL (ref 0–75)
Ketone: NEGATIVE
Nitrite: NEGATIVE
Ph: 6 (ref 4.5–8.0)
Protein: 30
RBC,UR: 290 /HPF (ref 0–5)
Specific Gravity: 1.012 (ref 1.003–1.030)
Squamous Epithelial: <1
WBC UR: 10 /HPF (ref 0–5)

## 2013-03-20 LAB — COMPREHENSIVE METABOLIC PANEL
Albumin: 3.3 g/dL — ABNORMAL LOW (ref 3.4–5.0)
Alkaline Phosphatase: 113 U/L (ref 50–136)
Anion Gap: 9 (ref 7–16)
BUN: 13 mg/dL (ref 7–18)
Bilirubin,Total: 0.4 mg/dL (ref 0.2–1.0)
Calcium, Total: 8.3 mg/dL — ABNORMAL LOW (ref 8.5–10.1)
Chloride: 99 mmol/L (ref 98–107)
Co2: 24 mmol/L (ref 21–32)
Creatinine: 0.85 mg/dL (ref 0.60–1.30)
EGFR (African American): >60
EGFR (Non-African Amer.): >60
Glucose: 94 mg/dL (ref 65–99)
Osmolality: 264 (ref 275–301)
Potassium: 3.6 mmol/L (ref 3.5–5.1)
SGOT(AST): 31 U/L (ref 15–37)
SGPT (ALT): 25 U/L (ref 12–78)
Sodium: 132 mmol/L — ABNORMAL LOW (ref 136–145)
Total Protein: 6 g/dL — ABNORMAL LOW (ref 6.4–8.2)

## 2013-03-20 LAB — CBC
HCT: 32.1 % — ABNORMAL LOW (ref 35.0–47.0)
HGB: 11.2 g/dL — ABNORMAL LOW (ref 12.0–16.0)
MCH: 32.2 pg (ref 26.0–34.0)
MCHC: 35 g/dL (ref 32.0–36.0)
MCV: 92 fL (ref 80–100)
Platelet: 209 10*3/uL (ref 150–440)
RBC: 3.49 10*6/uL — ABNORMAL LOW (ref 3.80–5.20)
RDW: 13.3 % (ref 11.5–14.5)
WBC: 9.4 10*3/uL (ref 3.6–11.0)

## 2013-03-20 LAB — PROTIME-INR
INR: 1
Prothrombin Time: 13.8 secs (ref 11.5–14.7)

## 2013-03-20 LAB — APTT: Activated PTT: 31.3 secs (ref 23.6–35.9)

## 2013-03-23 ENCOUNTER — Encounter: Payer: Self-pay | Admitting: Internal Medicine

## 2013-03-23 LAB — CBC WITH DIFFERENTIAL/PLATELET
Basophil #: 0 10*3/uL (ref 0.0–0.1)
Basophil %: 0.8 %
Eosinophil #: 0.3 10*3/uL (ref 0.0–0.7)
Eosinophil %: 7.1 %
HCT: 21.9 % — ABNORMAL LOW (ref 35.0–47.0)
HGB: 7.7 g/dL — ABNORMAL LOW (ref 12.0–16.0)
Lymphocyte #: 0.9 10*3/uL — ABNORMAL LOW (ref 1.0–3.6)
Lymphocyte %: 19.6 %
MCH: 32.6 pg (ref 26.0–34.0)
MCHC: 35.2 g/dL (ref 32.0–36.0)
MCV: 93 fL (ref 80–100)
Monocyte #: 0.5 x10 3/mm (ref 0.2–0.9)
Monocyte %: 11.5 %
Neutrophil #: 2.8 10*3/uL (ref 1.4–6.5)
Neutrophil %: 61 %
Platelet: 137 10*3/uL — ABNORMAL LOW (ref 150–440)
RBC: 2.36 10*6/uL — ABNORMAL LOW (ref 3.80–5.20)
RDW: 13.5 % (ref 11.5–14.5)
WBC: 4.6 10*3/uL (ref 3.6–11.0)

## 2013-03-23 LAB — BASIC METABOLIC PANEL
Anion Gap: 6 — ABNORMAL LOW (ref 7–16)
BUN: 6 mg/dL — ABNORMAL LOW (ref 7–18)
Calcium, Total: 8.2 mg/dL — ABNORMAL LOW (ref 8.5–10.1)
Chloride: 102 mmol/L (ref 98–107)
Co2: 26 mmol/L (ref 21–32)
Creatinine: 0.73 mg/dL (ref 0.60–1.30)
EGFR (African American): >60
EGFR (Non-African Amer.): >60
Glucose: 115 mg/dL — ABNORMAL HIGH (ref 65–99)
Osmolality: 267 (ref 275–301)
Potassium: 4 mmol/L (ref 3.5–5.1)
Sodium: 134 mmol/L — ABNORMAL LOW (ref 136–145)

## 2013-03-24 LAB — CBC WITH DIFFERENTIAL/PLATELET
Basophil #: 0 10*3/uL (ref 0.0–0.1)
Basophil %: 0.8 %
Eosinophil #: 0.4 10*3/uL (ref 0.0–0.7)
Eosinophil %: 8.6 %
HCT: 26.2 % — ABNORMAL LOW (ref 35.0–47.0)
HGB: 9.5 g/dL — ABNORMAL LOW (ref 12.0–16.0)
Lymphocyte #: 1.2 10*3/uL (ref 1.0–3.6)
Lymphocyte %: 24.8 %
MCH: 33.2 pg (ref 26.0–34.0)
MCHC: 36.3 g/dL — ABNORMAL HIGH (ref 32.0–36.0)
MCV: 91 fL (ref 80–100)
Monocyte #: 0.5 x10 3/mm (ref 0.2–0.9)
Monocyte %: 10.7 %
Neutrophil #: 2.6 10*3/uL (ref 1.4–6.5)
Neutrophil %: 55.1 %
Platelet: 153 10*3/uL (ref 150–440)
RBC: 2.86 10*6/uL — ABNORMAL LOW (ref 3.80–5.20)
RDW: 13.8 % (ref 11.5–14.5)
WBC: 4.8 10*3/uL (ref 3.6–11.0)

## 2013-03-31 ENCOUNTER — Encounter: Payer: Self-pay | Admitting: Internal Medicine

## 2013-04-10 ENCOUNTER — Ambulatory Visit: Payer: Self-pay | Admitting: Pain Medicine

## 2013-05-10 ENCOUNTER — Ambulatory Visit: Payer: Self-pay | Admitting: Pain Medicine

## 2013-06-19 DIAGNOSIS — R252 Cramp and spasm: Secondary | ICD-10-CM | POA: Insufficient documentation

## 2013-09-13 ENCOUNTER — Ambulatory Visit: Payer: Self-pay | Admitting: Family Medicine

## 2013-09-19 LAB — URINALYSIS, COMPLETE
Bacteria: NONE SEEN
Bilirubin,UR: NEGATIVE
Blood: NEGATIVE
Glucose,UR: NEGATIVE mg/dL (ref 0–75)
Ketone: NEGATIVE
Leukocyte Esterase: NEGATIVE
Nitrite: NEGATIVE
Ph: 7 (ref 4.5–8.0)
Protein: 100
RBC,UR: NONE SEEN /HPF (ref 0–5)
Specific Gravity: 1.017 (ref 1.003–1.030)
Squamous Epithelial: NONE SEEN
WBC UR: 6 /HPF (ref 0–5)

## 2013-09-19 LAB — CBC
HCT: 37.3 % (ref 35.0–47.0)
HGB: 13 g/dL (ref 12.0–16.0)
MCH: 32.5 pg (ref 26.0–34.0)
MCHC: 35 g/dL (ref 32.0–36.0)
MCV: 93 fL (ref 80–100)
Platelet: 245 10*3/uL (ref 150–440)
RBC: 4.01 10*6/uL (ref 3.80–5.20)
RDW: 15 % — ABNORMAL HIGH (ref 11.5–14.5)
WBC: 13.4 10*3/uL — ABNORMAL HIGH (ref 3.6–11.0)

## 2013-09-19 LAB — COMPREHENSIVE METABOLIC PANEL
Albumin: 3.8 g/dL (ref 3.4–5.0)
Alkaline Phosphatase: 110 U/L
Anion Gap: 8 (ref 7–16)
BUN: 19 mg/dL — ABNORMAL HIGH (ref 7–18)
Bilirubin,Total: 0.5 mg/dL (ref 0.2–1.0)
Calcium, Total: 9 mg/dL (ref 8.5–10.1)
Chloride: 93 mmol/L — ABNORMAL LOW (ref 98–107)
Co2: 25 mmol/L (ref 21–32)
Creatinine: 0.64 mg/dL (ref 0.60–1.30)
EGFR (African American): >60
EGFR (Non-African Amer.): >60
Glucose: 121 mg/dL — ABNORMAL HIGH (ref 65–99)
Osmolality: 257 (ref 275–301)
Potassium: 4.4 mmol/L (ref 3.5–5.1)
SGOT(AST): 28 U/L (ref 15–37)
SGPT (ALT): 37 U/L (ref 12–78)
Sodium: 126 mmol/L — ABNORMAL LOW (ref 136–145)
Total Protein: 7.2 g/dL (ref 6.4–8.2)

## 2013-09-19 LAB — TROPONIN I: Troponin-I: <0.02 ng/mL

## 2013-09-19 LAB — LIPASE, BLOOD: Lipase: 260 U/L (ref 73–393)

## 2013-09-20 ENCOUNTER — Observation Stay: Payer: Self-pay | Admitting: Internal Medicine

## 2013-09-20 LAB — BASIC METABOLIC PANEL
Anion Gap: 7 (ref 7–16)
BUN: 16 mg/dL (ref 7–18)
Calcium, Total: 8.6 mg/dL (ref 8.5–10.1)
Chloride: 95 mmol/L — ABNORMAL LOW (ref 98–107)
Co2: 25 mmol/L (ref 21–32)
Creatinine: 0.74 mg/dL (ref 0.60–1.30)
EGFR (African American): >60
EGFR (Non-African Amer.): >60
Glucose: 104 mg/dL — ABNORMAL HIGH (ref 65–99)
Osmolality: 257 (ref 275–301)
Potassium: 4 mmol/L (ref 3.5–5.1)
Sodium: 127 mmol/L — ABNORMAL LOW (ref 136–145)

## 2013-09-21 LAB — BASIC METABOLIC PANEL
Anion Gap: 8 (ref 7–16)
BUN: 13 mg/dL (ref 7–18)
Calcium, Total: 8.1 mg/dL — ABNORMAL LOW (ref 8.5–10.1)
Chloride: 101 mmol/L (ref 98–107)
Co2: 23 mmol/L (ref 21–32)
Creatinine: 0.66 mg/dL (ref 0.60–1.30)
EGFR (African American): >60
EGFR (Non-African Amer.): >60
Glucose: 112 mg/dL — ABNORMAL HIGH (ref 65–99)
Osmolality: 265 (ref 275–301)
Potassium: 4.1 mmol/L (ref 3.5–5.1)
Sodium: 132 mmol/L — ABNORMAL LOW (ref 136–145)

## 2013-09-21 LAB — LIPASE, BLOOD: Lipase: 102 U/L (ref 73–393)

## 2013-10-11 ENCOUNTER — Other Ambulatory Visit: Payer: Self-pay | Admitting: Gastroenterology

## 2013-10-11 DIAGNOSIS — R1310 Dysphagia, unspecified: Secondary | ICD-10-CM

## 2013-10-15 ENCOUNTER — Ambulatory Visit
Admission: RE | Admit: 2013-10-15 | Discharge: 2013-10-15 | Disposition: A | Discharge status: Home / Self Care | Payer: 59 | Source: Ambulatory Visit | Attending: Gastroenterology | Admitting: Gastroenterology

## 2013-10-15 DIAGNOSIS — R1310 Dysphagia, unspecified: Secondary | ICD-10-CM

## 2013-10-31 ENCOUNTER — Ambulatory Visit: Payer: Self-pay | Admitting: General Practice

## 2013-11-14 ENCOUNTER — Ambulatory Visit (INDEPENDENT_AMBULATORY_CARE_PROVIDER_SITE_OTHER): Payer: Medicare Other | Admitting: Podiatry

## 2013-11-14 ENCOUNTER — Encounter: Payer: Self-pay | Admitting: Podiatry

## 2013-11-14 VITALS — Ht <= 58 in | Wt 123.0 lb

## 2013-11-14 DIAGNOSIS — B353 Tinea pedis: Secondary | ICD-10-CM

## 2013-11-14 DIAGNOSIS — L608 Other nail disorders: Secondary | ICD-10-CM

## 2013-11-14 MED ORDER — ECONAZOLE NITRATE 1 % EX CREA: TOPICAL_CREAM | Freq: Two times a day (BID) | CUTANEOUS | Status: DC

## 2013-11-14 NOTE — Progress Notes (Signed)
   Subjective:    Patient ID: Jodi Colon, female    DOB: 22-Oct-1929, 80 y.o.   MRN: [id number]  HPI Comments: Right great toenail keeps getting ingrown , its thick and sore. Also my skin seem to be flaky and coming off      Review of Systems  All other systems reviewed and are negative.      Objective:   Physical Exam: I reviewed her past medical history medications allergies surgeries social history and review of systems. Pulses are strongly palpable. Neurologic sensorium is intact per since once the monofilament. Deep tendon reflexes are intact. Muscle strength is 5 over 5 dorsiflexors plantar flexors inverters everters all intrinsic musculature is intact. Orthopedic evaluation demonstrates all joints distal to the ankle a full range of motion without crepitation. Cutaneous evaluation demonstrates supple well hydrated cutis left foot with no nail plate abnormalities. However the right foot does demonstrate what appears to be tinea pedis to the plantar aspect as well as the interdigital spaces of the right foot. She also has a dystrophic nail which appears to be possibly mycotic however is very hard site.        Assessment & Plan:  Assessment: Tinea pedis with nail dystrophy possible onychomycosis.  Plan: Nail samples were taken today do sent out for evaluation. I wrote a prescription for Spectazole and I will followup with her once her biopsy report comes back.

## 2013-11-27 ENCOUNTER — Telehealth: Payer: Self-pay | Admitting: *Deleted

## 2013-11-27 NOTE — Telephone Encounter (Signed)
Called and left message for pt to call back to sch appt with dr Milinda Pointer for pathology results.

## 2013-11-28 ENCOUNTER — Encounter: Payer: Self-pay | Admitting: Podiatry

## 2013-12-07 DIAGNOSIS — M48061 Spinal stenosis, lumbar region without neurogenic claudication: Secondary | ICD-10-CM | POA: Insufficient documentation

## 2013-12-07 DIAGNOSIS — M5116 Intervertebral disc disorders with radiculopathy, lumbar region: Secondary | ICD-10-CM | POA: Insufficient documentation

## 2013-12-10 ENCOUNTER — Ambulatory Visit (INDEPENDENT_AMBULATORY_CARE_PROVIDER_SITE_OTHER): Payer: Medicare Other | Admitting: Podiatry

## 2013-12-10 ENCOUNTER — Encounter: Payer: Self-pay | Admitting: Podiatry

## 2013-12-10 VITALS — BP 128/62 | HR 74 | Resp 12

## 2013-12-10 DIAGNOSIS — B353 Tinea pedis: Secondary | ICD-10-CM

## 2013-12-10 DIAGNOSIS — B351 Tinea unguium: Secondary | ICD-10-CM

## 2013-12-10 MED ORDER — TAVABOROLE 5 % EX SOLN: CUTANEOUS | Status: DC

## 2013-12-10 NOTE — Progress Notes (Signed)
She presents today for pathology results regarding her hallux nail plate right. She also states that the antifungal cream that I provided her last visit has cleared up the plantar aspect of her right foot.  Objective: Vital signs are stable she is alert and oriented x3. No change in the nail plate.  Assessment: Onychomycosis per pathology report.  Plan: Continue the use of the antifungal cream. And we started her on a antifungal topical solution for her toenails. I will followup with her in 4-5 months and see how she's doing with this.

## 2013-12-12 ENCOUNTER — Ambulatory Visit: Payer: Medicare Other | Admitting: Podiatry

## 2014-01-28 DIAGNOSIS — I671 Cerebral aneurysm, nonruptured: Secondary | ICD-10-CM | POA: Insufficient documentation

## 2014-01-28 DIAGNOSIS — I10 Essential (primary) hypertension: Secondary | ICD-10-CM | POA: Insufficient documentation

## 2014-01-28 DIAGNOSIS — F32A Depression, unspecified: Secondary | ICD-10-CM | POA: Insufficient documentation

## 2014-01-28 DIAGNOSIS — F329 Major depressive disorder, single episode, unspecified: Secondary | ICD-10-CM | POA: Insufficient documentation

## 2014-01-28 DIAGNOSIS — A4902 Methicillin resistant Staphylococcus aureus infection, unspecified site: Secondary | ICD-10-CM | POA: Insufficient documentation

## 2014-01-28 DIAGNOSIS — D0510 Intraductal carcinoma in situ of unspecified breast: Secondary | ICD-10-CM | POA: Insufficient documentation

## 2014-01-28 DIAGNOSIS — M1990 Unspecified osteoarthritis, unspecified site: Secondary | ICD-10-CM | POA: Insufficient documentation

## 2014-01-28 DIAGNOSIS — E785 Hyperlipidemia, unspecified: Secondary | ICD-10-CM | POA: Insufficient documentation

## 2014-04-08 ENCOUNTER — Ambulatory Visit: Payer: Medicare Other | Admitting: Podiatry

## 2014-09-16 ENCOUNTER — Ambulatory Visit
Admit: 2014-09-16 | Disposition: A | Discharge status: Home / Self Care | Payer: Self-pay | Attending: Family Medicine | Admitting: Family Medicine

## 2014-09-20 NOTE — H&P (Signed)
Subjective/Chief Complaint Pain left elbow   History of Present Illness 81 year old female fell on left elbow last night walking her dog.  Brought to Emergency Room where exam and X-rays showed a displaced fracture of the tip of the left olecranon.  She also has a wound more distally over ulna.  Admitted for irrigation and fixtation of the triceps tendon to ulna.  Started on IV Vancomycin last night.  Seen by medical service and cleared for surgery.  Risks and benefits of surgery were discussed at length including but not limited to infection, non union, nerve or blood vessed damage, non union, need for repeat surgery, blood clots and lung emboli, and death.  On Plavix which will restart tomorrow.   Past Med/Surgical Hx:  Multi-drug Resistant Organism (MDRO): Positive culture for MRSA.  Breast Cancer:   Breast Cancer:   Hypothyroidism:   Gastric Reflux:   Stroke/TIA:   MRSA greater than 6 months ago:   GERD - Esophageal Reflux:   Hypertension:   Polyps removed from bladder:   Cataract Extraction:   Knee Surgery - Right:   Knee Surgery - Right:   Brain Surgery: shunt placement 2008  Breast Surgery: right  Hand Surgery - Right:   Back Surgery: 2005  Ankle Surgery - Left:   Cholecystectomy:   Hysterectomy - Partial:   ALLERGIES:  PCN: Rash  Septra: Rash  Cipro: Rash  Sulfa drugs: Rash  Prednisone: Unknown  HOME MEDICATIONS: Medication Instructions Status  aspirin 81 mg oral tablet 1 tab(s) orally once a day (in the morning) Active  venlafaxine extended release 150 mg oral capsule, extended release 1 cap(s) orally once a day (in the morning) Active  levothyroxine 50 mcg (0.05 mg) oral tablet 1 tab(s) orally once a day (in the morning) except on Sunday Active  lisinopril 40 mg oral tablet 1 tab(s) orally once a day (at bedtime) Active  pantoprazole 40 mg oral delayed release tablet 1 tab(s) orally 2 times a day Active  atorvastatin 80 mg oral tablet 1 tab(s) orally once a day (at  bedtime) Active  Niaspan ER 500 mg oral tablet, extended release 1 tab(s) orally once a day (at bedtime) Active  Paxil 20 mg oral tablet 1 tab(s) orally once a day Active  Ambien 5 mg oral tablet 0.5-1 tab(s) orally once a day (at bedtime), As Needed for sleep Active  acetaminophen-hydrocodone 325 mg-5 mg oral tablet 1 tab(s) orally every 6 hours, As Needed - for Pain Active  gabapentin 100 mg capsule 1-3 cap(s) orally 2-3 times a day Active  levothyroxine 50 mcg (0.05 mg) oral tablet 2 tab(s) orally once a day on Sunday Active  multivitamin 1 tab(s) orally once a day Active  Zanaflex 2 mg oral capsule 1-1.5 cap(s) orally once a day (at bedtime), As Needed for sleep Active  Plavix 75 mg oral tablet 1 tab(s) orally once a day Active  Cymbalta 60 mg oral delayed release capsule 1 cap(s) orally once a day Active   Family and Social History:  Family History Non-Contributory   Social History negative tobacco   Place of Living Home   Review of Systems:  Fever/Chills No   Cough No   Sputum No   Abdominal Pain No   Physical Exam:  GEN well developed, well nourished   HEENT pink conjunctivae   NECK supple   RESP normal resp effort   CARD regular rate   ABD denies tenderness   LYMPH negative neck   EXTR  Left arm splinted and circulation/sensation/motor function good distally.  Abraisions on fingers.  Wound over proximal ulna.  Tender and swollen.   SKIN normal to palpation   NEURO motor/sensory function intact   PSYCH alert, A+O to time, place, person, good insight   Lab Results: Hepatic:  21-Oct-14 22:07   Bilirubin, Total 0.4  Alkaline Phosphatase 113  SGPT (ALT) 25  SGOT (AST) 31  Total Protein, Serum  6.0  Albumin, Serum  3.3  Routine Chem:  21-Oct-14 22:07   Glucose, Serum 94  BUN 13  Creatinine (comp) 0.85  Sodium, Serum  132  Potassium, Serum 3.6  Chloride, Serum 99  CO2, Serum 24  Calcium (Total), Serum  8.3  Osmolality (calc) 264  eGFR (African  American) >60  eGFR (Non-African American) >60 (eGFR values <47m/min/1.73 m2 may be an indication of chronic kidney disease (CKD). Calculated eGFR is useful in patients with stable renal function. The eGFR calculation will not be reliable in acutely ill patients when serum creatinine is changing rapidly. It is not useful in  patients on dialysis. The eGFR calculation may not be applicable to patients at the low and high extremes of body sizes, pregnant women, and vegetarians.)  Anion Gap 9  Routine UA:  21-Oct-14 22:45   Color (UA) Yellow  Clarity (UA) Clear  Glucose (UA) Negative  Bilirubin (UA) Negative  Ketones (UA) Negative  Specific Gravity (UA) 1.012  Blood (UA) 3+  pH (UA) 6.0  Protein (UA) 30 mg/dL  Nitrite (UA) Negative  Leukocyte Esterase (UA) Trace (Result(s) reported on 20 Mar 2013 at 11:46PM.)  RBC (UA) 290 /HPF  WBC (UA) 10 /HPF  Bacteria (UA) TRACE  Epithelial Cells (UA) <1 /HPF  Mucous (UA) PRESENT (Result(s) reported on 20 Mar 2013 at 11:46PM.)  Routine Coag:  21-Oct-14 22:07   Activated PTT (APTT) 31.3 (A HCT value >55% may artifactually increase the APTT. In one study, the increase was an average of 19%. Reference: "Effect on Routine and Special Coagulation Testing Values of Citrate Anticoagulant Adjustment in Patients with High HCT Values." American Journal of Clinical Pathology 2006;126:400-405.)  Prothrombin 13.8  INR 1.0 (INR reference interval applies to patients on anticoagulant therapy. A single INR therapeutic range for coumarins is not optimal for all indications; however, the suggested range for most indications is 2.0 - 3.0. Exceptions to the INR Reference Range may include: Prosthetic heart valves, acute myocardial infarction, prevention of myocardial infarction, and combinations of aspirin and anticoagulant. The need for a higher or lower target INR must be assessed individually. Reference: The Pharmacology and Management of the Vitamin K   antagonists: the seventh ACCP Conference on Antithrombotic and Thrombolytic Therapy. CUQJFH.5456Sept:126 (3suppl): [id number] A HCT value >55% may artifactually increase the PT.  In one study,  the increase was an average of 25%. Reference:  "Effect on Routine and Special Coagulation Testing Values of Citrate Anticoagulant Adjustment in Patients with High HCT Values." American Journal of Clinical Pathology 2006;126:400-405.)  Routine Hem:  21-Oct-14 22:07   WBC (CBC) 9.4  RBC (CBC)  3.49  Hemoglobin (CBC)  11.2  Hematocrit (CBC)  32.1  Platelet Count (CBC) 209 (Result(s) reported on 20 Mar 2013 at 10:24PM.)  MCV 92  MCH 32.2  MCHC 35.0  RDW 13.3   Radiology Results: XRay:    21-Oct-14 19:29, Elbow Left Complete  Elbow Left Complete  REASON FOR EXAM:    Fall, Hematoma  COMMENTS:       PROCEDURE: DXR - DXR ELBOW LT  COMP W/OBLIQUES  - Mar 20 2013  7:29PM     RESULT: Comparison:  None    Findings:    4 views of the left elbow demonstrates a comminuted and displaced   olecranon fracture. There is no dislocation. There is 10 mm of   distraction between the major fracture fragments. There is overlying soft   tissue swelling.    IMPRESSION:   Comminuted and displaced left olecranon fracture.    Dictation Site: 1        Verified By: Jennette Banker, M.D., MD  LabUnknown:  PACS Image    Assessment/Admission Diagnosis Displaced fracture left tip of olecranon.   Plan Irrigation and repair of triceps to ulna.   Electronic Signatures: Park Breed (MD)  (Signed 22-Oct-14 08:08)  Authored: CHIEF COMPLAINT and HISTORY, PAST MEDICAL/SURGIAL HISTORY, ALLERGIES, HOME MEDICATIONS, FAMILY AND SOCIAL HISTORY, REVIEW OF SYSTEMS, PHYSICAL EXAM, LABS, Radiology, ASSESSMENT AND PLAN   Last Updated: 22-Oct-14 08:08 by Park Breed (MD)

## 2014-09-20 NOTE — Discharge Summary (Signed)
PATIENT NAME:  Jodi Colon, Jodi Colon MR#:  [id number] DATE OF BIRTH:  April 21, 1930  DATE OF ADMISSION:  03/20/2013 DATE OF DISCHARGE: 03/24/2013   FINAL DIAGNOSES:  1.  Open fracture tip of the left elbow olecranon. 2.  Hyperlipidemia.  3.  Hypertension.  4.  Cataracts.  5.  History of Methicillin-resistant Staphylococcus aureus. 6.  History of cerebrovascular accident.  7.  Hypothyroidism.  8.  Gastroesophageal reflux disease.  OPERATIONS; Irrigation and debridement, open repair of the left triceps to the ulna on 03/21/2013.   COMPLICATIONS: None.   CONSULTATIONS: Dr. Waldron Labs from prime doc.   DISCHARGE MEDICATIONS: Doxycycline 100 mg b.i.d. x 3 days, Norco 5/325 mg q.6 hours  p.r.n. pain, 81 mg aspirin daily, Plavix 75 mg daily, lisinopril 40 mg daily, gabapentin 100 mg 1 to 3 capsules 2 or 3 times a day, Cymbalta 60 mg daily, Paxil 20 mg daily, Effexor 150 mg daily, atorvastatin 80 mg at bedtime, Niaspan extended release 500 mg at bedtime, Ambien 5 mg  to 1 p.r.n., Zanaflex 2 mg 1-1/2 capsules at bedtime as needed, levothyroxine 100 mcg every Sunday and 50 mg daily otherwise, Protonix 40 mg b.i.d. and multivitamins daily.   HISTORY OF PRESENT ILLNESS: The patient is an 82 year old female, who tripped over her dog on the night of admission and landed on her left hand and elbow. She had a laceration over the dorsal aspect of the proximal forearm just distal to the joint. This bled very profusely in the Emergency Room. X-ray showed an avulsed fracture of the tip of the olecranon. She was seen in Emergency Room and admitted for IV antibiotics. Vancomycin was started due to her history of MRSA. She was scheduled for surgery. She was seen in consultation by the prime doc service and cleared for surgery. She had a recent cystoscopy done by Dr. 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 She also had hemorrhoid surgery 02/01/2013. The patient also had 10 white cells per  high power field in her urine.   ALLERGIES: CIPRO, PENICILLIN, PREDNISONE, SEPTRA AND SULFA DRUGS.   HOME MEDICATIONS: As above.   PAST MEDICAL HISTORY: As above.  PAST SURGICAL HISTORY: Tonsillectomy, right wrist surgery, gallbladder, hysterectomy, left lumbar surgery, left ankle surgery, lumpectomy of the breast and stent in the artery of the brain.   SOCIAL HISTORY: The patient does not smoker or drink and is a widow. She lives alone.   FAMILY HISTORY: Positive for coronary artery disease, hypertension, rheumatoid arthritis and CVA.   REVIEW OF SYSTEMS: Otherwise unremarkable.   PHYSICAL EXAMINATION: The patient is alert and cooperative. The left arm was splinted and neurovascular status was intact distally. She had abrasions over the fingers and swelling around the ring finger PIP joint where her ring has been removed. Neurovascular status was good.   X-rays of the left hand showed no fractures or dislocations.   LABORATORY DATA: On admission, hemoglobin was 11.2 and dropped to 7.7 on 03/23/2013.   HOSPITAL COURSE: The patient was started on IV vancomycin and taken to surgery in the morning where irrigation and debridement was carried out. She had good deal of clot in the wound and some separation of the subcutaneous tissue from the muscle fascia. The fracture did not appear to enter the joint. The distal portion of the small bony fragment was excised from the triceps, and the triceps tendon was repaired to the ulna. Hemovac drains were left in place to close the dead space.  She was kept on IV vancomycin. She did well postoperatively and dressings were changed on 03/23/2013. Her wounds were benign. She was placed in a well-padded long arm cast and the drains were removed. She is scheduled for discharge to a skilled nursing facility tomorrow, 03/24/2013. Rehabilitation potential is good. She cannot care for herself at home. She will be seen in my office in 2 weeks for exam. Vancomycin was  discontinued today and will place her on  doxycycline 100 mg b.i.d. for 3 days since this is an open fracture.   ____________________________ Park Breed, MD hem:aw D: 03/23/2013 14:14:32 ET T: 03/23/2013 14:23:56 ET JOB#: [id number]  cc: Park Breed, MD, <Dictator> [place] MD ELECTRONICALLY SIGNED 03/23/2013 23:38

## 2014-09-20 NOTE — Consult Note (Signed)
PATIENT NAME:  Jodi Colon, Jodi Colon MR#:  [id number] DATE OF BIRTH:  05/19/1930  DATE OF CONSULTATION:  03/20/2013  REFERRING PHYSICIAN:      Earnestine Leys, M.D. CONSULTING PHYSICIAN:  Albertine Patricia, MD  PRIMARY CARE PHYSICIAN: Following at [facility] in [place].   REASON FOR MEDICAL CONSULTATION: Preoperative evaluation prior to left elbow fracture repair surgery.   HISTORY OF PRESENT ILLNESS: This is an 81 year old female with significant past medical history of hypertension, hyperlipidemia, CVA, hypothyroidism, who presents with a mechanical fall. The patient had fall this p.m. after she tripped on her dog. She was walking the dog, and he saw some people where he did walk in front of her where she tripped on him. The patient fell on her left elbow, was complaining of significant left elbow pain and left hip pain. In the ED, the patient had left hip x-ray and left elbow w-ray, where left hip x-ray did not show any evidence of fracture, but her elbow x-ray did show a comminuted and displaced left olecranon fracture.  Hospitalist service was requested to evaluate the patient for preop by reviewing the patient's records.  It appears she recently had cystoscopy done by Dr. Bernardo Heater as she is known to have history of bladder surgery with that pathology showing a urethral carcinoma in situ in the posterior wall of the bladder, this was done on 10/15. As well, she appears to have had another hemorrhoid surgery with a stapled hemorrhoidopexy on 09/04 of this year, where she went under general anesthesia and she tolerated this very well. The patient denies any chest pain, any shortness of breath, any altered mental status, any presyncope or dizziness preceded the fall. The patient's blood work were essentially without significant abnormalities. The patient's EKG does not show any significant ST or T wave changes. The patient's urinalysis showed 10 white blood cells and mild leukocyte esterase.   ALLERGIES:   CIPRO, PENICILLIN, PREDNISONE, SEPTRA, SULFA DRUGS.    HOME MEDICATIONS: 1.  Percocet as needed.  2.  Aspirin 81 mg daily.  3.  Lisinopril 40 mg oral daily.  4.  Gabapentin 100 mg oral 1 to 3 capsules 2 to 3 times a day.  5.  Cymbalta 60 mg oral daily.  6.  Paxil 20 mg oral daily.  7.  Effexor 150 mg oral daily.  8.  Atorvastatin 80 mg at bedtime.  9.  Niaspan extended release 500 mg oral at bedtime.  10.  Plavix 75 mg oral daily.  11.  Ambien 5 mg 1/2 tablet to 1 tablet as needed nightly for sleep.  12.  Zanaflex 2 mg oral 1 to 1.5 capsules at bedtime as needed for sleep.  13. Levothyroxine 100 mcg every Sunday and 50 mcg daily, except Sundays.  14.  Protonix 40 mg oral b.i.d.  15.  Multivitamin 1 tablet oral daily.   PAST MEDICAL HISTORY: 1.  Hyperlipidemia.  2.  Hypertension.  3.  Cataract.  4.  History of  MRSA. 5.  History of CVA.  6.  Hypothyroidism.  7.  Gastroesophageal reflux disease.   PAST SURGICAL HISTORY:  1.  Tonsillectomy.  2.  Right wrist surgery.  3.  Cholecystectomy.  4.  Hysterectomy.  5.  Lumbar surgery.  6.  Left ankle surgery.  7.  Lumpectomy.   8.  Stent in artery in brain.   SOCIAL HISTORY: No alcohol. No smoking. She is a widow.    FAMILY HISTORY: Significant for coronary artery disease in her father,  hypertension in her father and rheumatoid arthritis in her mother and CVA in the father.    REVIEW OF SYSTEMS: CONSTITUTIONAL: The patient denies any fever, chills, fatigue, weakness.  EYES: Denies blurry vision, double vision, inflammation, glaucoma,   EARS, NOSE, THROAT: Denies any epistaxis, discharge, any hearing problems, any ear discharge.  RESPIRATORY: Denies any cough, COPD, wheezing, hemoptysis.  CARDIOVASCULAR: Denies any chest pain, syncope, dizziness, lightheadedness, exertional dyspnea.  GASTROINTESTINAL: Denies nausea, vomiting, diarrhea, abdominal pain.  ENDOCRINE: Denies polyuria, polydipsia, heat or cold intolerance.   HEMATOLOGY: Denies anemia, easy bruising, bleeding diathesis.  INTEGUMENTARY: Denies any acne, rash or cyanosis.  MUSCULOSKELETAL: Reports left elbow pain. Otherwise, denies any baseline arthritis or cramps.  NEUROLOGIC: Denies any focal deficits, any tingling, any numbness, any dizziness, lightheadedness. Reports history of CVA.  PSYCHIATRIC: Denies any anxiety, has baseline insomnia and depression.   PHYSICAL EXAMINATION: VITAL SIGNS: Temperature 97.5. pulse 80, respiratory rate 20, blood pressure 125/60, saturating  96 % on room air.  GENERAL: Well-nourished elderly female who looks comfortable, in no apparent distress.  HEENT: Head atraumatic, normocephalic.  Pupils equally reactive to light, pink conjunctivae. Anicteric sclerae, moist oral mucosa.  NECK: Supple. No thyromegaly. No JVD.  CHEST: Good air entry bilaterally. No wheezing, rales or rhonchi.  CARDIOVASCULAR: S1, S2 heard. No rubs, murmurs or gallops.  ABDOMEN: Soft, nontender, nondistended, bowel sounds present.  EXTREMITIES: No clubbing. No cyanosis. Dorsalis pedis pulse +2 bilaterally.  MUSCULOSKELETAL: The patient had left arm wrapped with ACE wrap all the way to the wrist, but right/left pulses appreciated. No cyanosis. Good capillary refill in the left hand.  PSYCHIATRIC: Appropriate affect. Awake, alert x 3.  NEUROLOGICAL: Cranial nerves grossly intact, 5/5, no focal deficits.    LYMPHATICS: No cervical or no supraclavicular lymphadenopathy.   PERTINENT LABORATORY DATA: Glucose 94, BUN 13, creatinine 0.85, sodium 132, potassium 3.6., chloride 99, ALT 25, AST 31, alkaline phosphatase 113. White blood cell 9.4, hemoglobin 11.2, hematocrit 32.1, platelets 209, INR 1. Urinalysis showing 10 white blood cells, 90 red blood cells and trace leukocyte esterase. EKG showing left fascicular branch block with normal sinus rhythm with no significant ST or T wave changes.   ASSESSMENT AND PLAN: 1.  Preop evaluation for left elbow  fracture. The patient has no arrhythmias. No chest pain. No shortness of breath. No significant lab abnormalities. The patient did have recent hemorrhoid surgery with general anesthesia. Will continue to hold patient aspirin and Plavix until day after surgery. The patient is moderate risk for surgery given her history of hypertension and CVA. At this point, there is no further work-up is indicated prior to proceeding with surgery.  2.  Urinary tract infection: Due to patient's multiple allergies, we will start her on Invanz.  3.  Recent cystoscopy with carcinoma in situ, the patient known to have history of breast cancer in the past. She is following with Dr. Bernardo Heater as an outpatient. The patient has an appointment scheduled on the 29th of this month which she will keep.  4.  Hypertension, acceptable. Continue with lisinopril.  5.  Hyperlipidemia. Continue with statin and niacin extended release.  6.  Hyperlipidemia. The patient appears to be in three different SSRIs. For now, we will continue her on Cymbalta only until discharge, will resume the rest of her meds.  7.  Hypothyroidism. Continue with Synthroid.  8. History of cerebrovascular accident. The patient had no symptoms currently. She will be resumed on his aspirin and Plavix postoperative.  9.  Gastrointestinal  prophylaxis: The patient is on Protonix.  10.  Deep vein thrombosis prophylaxis: Currently we will keep her on sequential compression device and then has per primary orthopedic team recommendation.    Total time spent on medical consult: 50 minutes.   ____________________________ Albertine Patricia, MD dse:NTS D: 03/21/2013 04:34:05 ET T: 03/21/2013 06:04:35 ET JOB#: [id number]  cc: Albertine Patricia, MD, <Dictator> [place] Graciela Husbands MD ELECTRONICALLY SIGNED 03/23/2013 3:05

## 2014-09-20 NOTE — Op Note (Signed)
PATIENT NAME:  Jodi Colon, GARROW MR#:  [id number] DATE OF BIRTH:  05/23/1930  DATE OF PROCEDURE:  03/14/2013  PREOPERATIVE DIAGNOSIS: Abnormal urine cytology.   POSTOPERATIVE DIAGNOSIS: Abnormal urine cytology.  PROCEDURE: Cystoscopy with random bladder biopsies.   SURGEON: John Giovanni, M.D.   ASSISTANT: None.   ANESTHESIA: General.   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 She underwent a 6-week course of intravesical BCG and was on maintenance therapy. Recent surveillance cystoscopy was unremarkable; however, urine cytology returned positive for dysplasia and FISH was positive. A CT urogram showed no upper tract abnormalities. She presents for cystoscopy under anesthesia and bladder biopsies.   DESCRIPTION OF PROCEDURE: The patient taken to the operating room where she was placed in the supine position. She was then placed in the low lithotomy position and her external genitalia were prepped and draped in the usual fashion. A timeout was performed per protocol with all in agreement. A 21-French cystoscope sheath with obturator was lubricated and passed per urethra. Panendoscopy was performed. The bladder mucosa was normal in appearance without erythema, solid or papillary lesions. Cystoscopy with narrow band imaging was performed. There was some slight abnormality noted on the posterior wall under narrow band imaging. Cold biopsies were taken of this area in addition to random biopsies of the right and left lateral walls, dome and trigone. All biopsy sites were fulgurated with a Bugbee electrode. At the completion of procedure, hemostasis was adequate. The ureteral orifices were normal-appearing with clear efflux. No other abnormalities were noted. The cystoscope was removed. A 16-French Foley catheter was placed with return of clear effluent upon irrigation. A B and O  suppository was placed per rectum. She was taken to PACU in stable condition. There were no complications. EBL minimal.   ____________________________ Ronda Fairly. Bernardo Heater, MD scs:aw D: 03/14/2013 12:26:14 ET T: 03/14/2013 13:18:59 ET JOB#: [id number]  cc: Nicki Reaper C. Bernardo Heater, MD, <Dictator> Abbie Sons MD ELECTRONICALLY SIGNED 04/03/2013 15:17

## 2014-09-20 NOTE — Op Note (Signed)
PATIENT NAME:  Jodi Colon, Jodi Colon MR#:  [id number] DATE OF BIRTH:  01/26/1930  DATE OF PROCEDURE:  02/01/2013  PREOPERATIVE DIAGNOSIS:  Symptomatic hemorrhoids, mild mucosal rectal prolapse.   POSTOPERATIVE DIAGNOSIS:  Symptomatic hemorrhoids, mild mucosal rectal prolapse.  OPERATIVE PROCEDURE:  Stapled hemorrhoidopexy.   SURGEON:  Hervey Ard, M.D.   ANESTHESIA:  General by LMA under Dr. [place] Service, Marcaine 0.5% with 1:200,000 units of epinephrine 30 mL local infiltration.   ESTIMATED BLOOD LOSS:  Less than 50 mL.   CLINICAL NOTE:  This 81 year old woman has been troubled by bleeding internal hemorrhoids.  Clinical exam shows mild laxity of the anal sphincter and prolapse of the internal hemorrhoids with a small amount of the posterior rectal mucosa.  She was felt to be a candidate for a stapled hemorrhoidectomy.   OPERATIVE NOTE:  The patient received Invanz 1 gram prior to the procedure.  She underwent general anesthesia and tolerated this well.  She was placed in the dorsal lithotomy position with careful care to pad the medial aspects of both calves.  The area was prepped with Betadine solution and draped.  The anus was gently dilated.  The [facility] stapler obturator was stitched to the perineum after milking the perineal skin back.  A 2-0 Prolene suture was placed approximately 4 cm above the anal verge under direct vision.  The [place] stapler was entered and the suture tied to the anvil assuring good apposition of the mucosa.  The device was closed and fired.  A complete donut was obtained.  Prior to firing palpation through the vagina showed no evidence of entrapment of the vaginal musculature.  Inspection with the anoscope showed good hemostasis.  A Vaseline gauze pack was placed for continued hemostasis.  This was to be removed 30 minutes later in the recovery room.    The area was reprepped with Betadine solution and Marcaine infiltrated for postoperative analgesia.   The patient  tolerated the procedure well and was taken to the recovery room in stable condition.    ____________________________ Robert Bellow, MD jwb:ea D: 02/01/2013 22:39:23 ET T: 02/01/2013 23:16:17 ET JOB#: [id number]  cc: Robert Bellow, MD, <Dictator> Floria Raveling. Astrid Divine, MD Robert Bellow, MD Shaniya Tashiro Amedeo Kinsman MD ELECTRONICALLY SIGNED 02/02/2013 17:45

## 2014-09-20 NOTE — Op Note (Signed)
PATIENT NAME:  Jodi Colon, Jodi Colon MR#:  [id number] DATE OF BIRTH:  Jan 17, 1930  DATE OF PROCEDURE:  03/21/2013  PREOPERATIVE DIAGNOSES: Open fracture, tip of the left olecranon.   POSTOPERATIVE DIAGNOSES: Open fracture, tip of the left olecranon.   PROCEDURE PERFORMED:  1.  Open irrigation and debridement, left elbow, with resection of a small fragment of the olecranon. 2.  Repair of the triceps tendon to the ulna.   SURGEON: Park Breed, M.D.   ANESTHESIA: General LMA.   COMPLICATIONS: None.   DRAINS: Two Hemovacs.   ESTIMATED BLOOD LOSS: Minimal, replaced none.   DESCRIPTION OF PROCEDURE: The patient was brought to the operating room where she underwent satisfactory general LMA anesthesia in the supine position. The left arm was prepped and draped in sterile fashion. She had a 3 cm crescent-shaped laceration on the dorsal lateral aspect of the forearm about 3 inches below the elbow. There was a considerable amount of  blood clot here. This did track down to the tip of the olecranon. The initial skin was debrided. Blood clot was removed. The wound was extended proximally up the triceps for a distance. Soft tissue dissection was carried out with scissors. The bony fragment was identified. It appeared to be nonarticular. The joint appeared to be closed. The fragment was excised. Pulsed lavage irrigation was carried out with 3 liters of saline and GU irrigant. All the old clot was removed. Several drill holes were then made in the proximal ulna. Two #5 Ethibond sutures were woven through the triceps tendon and brought up through drill holes in the ulna and tied down, securing with the arm extended to secure the triceps back against the ulna; #2 FiberWire was then used as well medially and laterally along the distal triceps for repair. This provided secure fixation. The wound was again irrigated and subcutaneous tissue closed with 3-0 Vicryl over 2 small Hemovac drains. The patient did have  significant separation of skin and subcutaneous tissue from the muscle fascia for a distance down the forearm, and Hemovacs were left along to help remove the dead space air. The skin was closed with staples. A dry sterile dressing, well-padded dressing, with posterior splint was applied and tourniquet deflated with good return of blood flow to the hand. The Hemovac was activated. The patient was awakened and taken to recovery in good condition.    ____________________________ Park Breed, MD hem:jm D: 03/21/2013 13:12:54 ET T: 03/21/2013 13:34:31 ET JOB#: [id number]  cc: Park Breed, MD, <Dictator> Park Breed MD ELECTRONICALLY SIGNED 03/22/2013 12:57

## 2014-09-21 NOTE — Discharge Summary (Signed)
PATIENT NAME:  Jodi Colon, LABARBERA MR#:  [id number] DATE OF BIRTH:  July 24, 1929  DATE OF ADMISSION:  09/20/2013 DATE OF DISCHARGE:  09/21/2013  Patient admitted as an observation 09/21/2013.  FINAL DIAGNOSES:  1. Systemic inflammatory response syndrome viral gastroenteritis.  2. Hyponatremia.  3. Accelerated hypertension.  4. Left hip bursitis.  5. History of cerebrovascular accident.  6. Hyperlipidemia.  7. Hypothyroidism.   MEDICATIONS ON DISCHARGE: Include aspirin 81 mg daily, Effexor-XR 150 mg daily, levothyroxine 50 mcg daily except on Sunday, lisinopril 40 mg at bedtime, Protonix 40 mg twice a day, atorvastatin 80 mg at bedtime, multivitamin 1 tablet daily, Plavix 75 mg daily, gabapentin 100 mg 1 to 3 tablets p.o. b.i.d., trazodone 100 mg at bedtime, Myrbetriq 25 mg once a day.   DIET: Regular consistency regular diet.   ACTIVITY: As tolerated.   FOLLOWUP: In 1-2 weeks with Dr. Gayland Curry.  LABORATORY AND RADIOLOGICAL DATA DURING THE HOSPITAL COURSE: Included an EKG normal sinus rhythm, left anterior fascicular block. Cardiac enzymes negative. Glucose 121, BUN 19, creatinine 0.64, sodium 126, potassium 4.4, chloride 93, CO2 of 25, calcium 9.0. Liver function tests normal range. Lipase 260. White blood cell count 13.4, H and H 13.0 and 37.3, platelet count of 245,000.   URINALYSIS: 100 mg/dL of protein. Chest x-ray negative. Abdomen flat and upright nonobstructive bowel gas pattern. CT scan of the head: No acute intracranial abnormality, mild atrophy with moderate chronic microvascular ischemic disease, remote lacunar infarcts in the left basal ganglial, endovascular stent in place within the M1 segment of the right middle cerebral artery. Creatinine upon discharge 0.66, sodium 132.   HOSPITAL COURSE PER PROBLEM LIST:  1. For the patient's systemic inflammatory response syndrome and viral gastroenteritis, I believe that the tachycardia and leukocytosis was because of the nausea and  vomiting. This had resolved totally the patient had been tolerating diet without a problem.  2. For the patient's hyponatremia, I believe this is dehydration. The patient was hydrated up. Sodium came up to 132, the patient tolerating diet at this point. No further evaluation.  3. Accelerated hypertension. Blood pressure was very elevated upon presentation. On arrival to the floor 174/72. Upon discharge 108/66. Likely secondary to the nausea and vomiting. Also she was kept on her usual medication of lisinopril.  4. For her left hip bursitis, this was paining her since coming into the ER. I gave 1 dose of IV steroid and it improved.  5. History of cerebrovascular accident, on aspirin and Plavix.  6. Hyperlipidemia: On atorvastatin.  7. Hypothyroidism, on levothyroxine.  TIME SPENT ON DISCHARGE: 35 minutes.    ____________________________ Tana Conch. Leslye Peer, MD rjw:lt D: 09/21/2013 16:14:13 ET T: 09/21/2013 23:06:24 ET JOB#: [id number]  cc: Tana Conch. Leslye Peer, MD, <Dictator> Floria Raveling. Astrid Divine, MD Marisue Brooklyn MD ELECTRONICALLY SIGNED 09/30/2013 13:02

## 2014-09-21 NOTE — H&P (Signed)
PATIENT NAME:  Jodi Colon, Jodi Colon MR#:  [id number] DATE OF BIRTH:  1929/12/03  DATE OF ADMISSION:  09/19/2013  REFERRING PHYSICIAN: Dr. Thomasene Lot.   PRIMARY CARE PHYSICIAN: Gayland Curry.   CHIEF COMPLAINT: Nausea, vomiting.   HISTORY OF PRESENT ILLNESS: An 81 year old Caucasian female with a past medical history of hypertension, hyperlipidemia, history of transient ischemic attacks as well as hypothyroidism and "brain surgery" (which means stenting of an artery) who is presenting with nausea, vomiting. She describes a 3-hour duration of nausea with associated decreased p.o. intake and generalized fatigue and weakness, as well as attests to one-day duration of vomiting of nonbloody, nonbilious emesis and associated burning epigastric pain, nonradiating, 2 to 3 out of 10 in intensity. No worsening or relieving factors. Of note, she has not taken her blood pressure medications for about two days' duration. Currently, she is only complaining of generalized fatigue; otherwise, no further symptomatology.    REVIEW OF SYSTEMS:  CONSTITUTIONAL: Positive for fatigue, weakness. Denies fevers, chills.  EYES: Denies blurred vision, double vision or eye pain.  ENT: Denies tinnitus, ear pain, hearing loss.  RESPIRATORY: Denies cough, wheeze, shortness of breath.  CARDIOVASCULAR: Denies chest pain, palpitations.  GASTROINTESTINAL: Positive for nausea, vomiting, abdominal pain as described above. Denies any diarrhea, constipation. Last bowel movement was three hours prior to my history and physical.  GENITOURINARY: Denies dysuria, hematuria.  ENDOCRINE: No nocturia or thyroid problems.  HEMATOLOGIC AND LYMPHATIC: Denies easy bruising, bleeding.  SKIN: Denies rash or lesion.  MUSCULOSKELETAL: Denies pain in neck, back, shoulder, knees, hips or arthritic symptoms.  NEUROLOGIC: Denies any paralysis, paresthesias.  PSYCHIATRIC: Denies anxiety or depressive symptoms.  Otherwise, full review of systems performed by  me is negative.   PAST MEDICAL HISTORY: Hypertension, hyperlipidemia, history of transient ischemic attack, hypothyroidism and "brain surgery" (which refers to stenting of an artery), as well as history of breast cancer status post lumpectomy.   SOCIAL HISTORY: Denies any alcohol, tobacco or drug usage. Fully ambulatory at baseline.   FAMILY HISTORY: Positive for coronary artery disease and hypertension.    ALLERGIES: CIPRO, PENICILLIN, PREDNISONE, SEPTRA, AS WELL AS SULFA DRUGS.   HOME MEDICATIONS: Include aspirin 81 mg p.o. daily, lisinopril 40 mg p.o. daily, gabapentin 300 mg p.o. b.i.d., trazodone 100 mg p.o. at bedtime, venlafaxine extended release 150 mg p.o. daily, atorvastatin 80 mg p.o. daily, Plavix 75 mg p.o. daily, pantoprazole 40 mg p.o. b.i.d., Synthroid 50 mcg p.o. daily, Myrbetriq 25 mg p.o. daily, multivitamin 1 tablet p.o. daily.   PHYSICAL EXAMINATION: VITAL SIGNS: Temperature 97.9, heart rate 105, respirations 20, blood pressure 224/86, saturating 96% on room air. Weight 55.3 kg, BMI 26.4.  GENERAL: Weak-appearing Caucasian female, currently in no acute distress.  HEAD: Normocephalic, atraumatic.  EYES: Pupils equal, round, reactive to light. Extraocular muscles intact. No scleral icterus.  MOUTH: Dry mucosal membranes. Dentition intact. No abscess noted.  EARS, NOSE, AND THROAT: Throat clear without exudates. No external lesions.  NECK: Supple. No thyromegaly. No nodules. No JVD.  PULMONARY: Clear to auscultation bilaterally without wheezes, rubs or rhonchi. No use of accessory muscles. Good respiratory effort.  CHEST: Nontender to palpation.  CARDIOVASCULAR: S1, S2, tachycardic. No murmurs, rubs, or gallops. No edema. Pedal pulses 2+ bilaterally.  GASTROINTESTINAL: Soft, nontender, nondistended. No masses. Positive bowel sounds. No hepatosplenomegaly.  MUSCULOSKELETAL: No swelling, clubbing, or edema. Range of motion full in all extremities.  NEUROLOGIC: Cranial nerves  II through XII intact. No gross focal neurological deficits. Sensation intact. Reflexes intact.  SKIN:  No ulcerations, lesions, rashes, cyanosis. Skin warm, dry. Skin turgor intact.  PSYCHIATRIC: Mood and affect within normal limits. The patient is awake, alert, oriented x3. Insight and judgment intact.   LABORATORY DATA: CT head performed, which reveals no acute intracranial process. There is mention of the endovascular stent placement in the N1 segment of the right middle cerebral artery. Remainder of laboratory data: Sodium 126, potassium 4.4, chloride 93, bicarbonate 25, BUN 19, creatinine 0.64, glucose 121. LFTs within normal limits. WBC 13.4, hemoglobin 13, platelets 245. Urinalysis negative for evidence of infection.   ASSESSMENT AND PLAN: An 81 year old Caucasian female with history of hypertension, hyperlipidemia, who is presenting with nausea and vomiting.  1.  Systemic inflammatory response syndrome, meeting systemic inflammatory response syndrome criteria by leukocytosis and heart rate. Provide supportive treatment with IV fluids and Zofran. No evidence of infectious etiology at this time, no indication for antibiotics.  2.  Intractable nausea, vomiting. IV fluids and Zofran. Clear liquid diet. Advance as tolerated.  3.  Hyponatremia, 276 mEq sodium deficit. Provide 75 mL an hour of normal saline. Follow sodium trends.  4.  Malignant hypertension. She has not taken any of her blood pressure medications in two days. We will restart her home medications as well as add as-needed hydralazine for systolic blood pressure greater than 180.  5.  Deep venous thrombosis prophylaxis. Heparin subcutaneous.   CODE STATUS: The patient is full code.  TIME SPENT: 45 minutes.   ____________________________ Aaron Mose. Aarvi Stotts, MD dkh:cg D: 09/20/2013 00:40:31 ET T: 09/20/2013 01:59:41 ET JOB#: [id number]  cc: Aaron Mose. Stephene Alegria, MD, <Dictator> Shawntina Diffee Woodfin Ganja MD ELECTRONICALLY SIGNED 09/21/2013 2:03

## 2014-09-22 NOTE — Op Note (Signed)
PATIENT NAME:  Jodi Colon, Jodi Colon MR#:  [id number] DATE OF BIRTH:  July 25, 1929  DATE OF PROCEDURE:  10/08/2011  PREOPERATIVE DIAGNOSIS: Right gluteal abscess.   POSTOPERATIVE DIAGNOSIS: Right gluteal abscess.   OPERATIVE PROCEDURE: Incision and drainage right gluteal abscess.   SURGEON: Robert Bellow, MD  ANESTHESIA: Sedation under Dr. Ronelle Nigh.   ESTIMATED BLOOD LOSS: 5 mL.   CLINICAL NOTE: This 81 year old woman has been plagued with recurrent and diffuse abscesses with MRSA. She reported pain in the gluteal area one week ago with increasing discomfort. She was admitted today with a history of progressive pain. She was febrile and examination showed a 5-6 cm area of marked erythema with mild fluctuance. She was felt to be a candidate for operative debridement. Acute discomfort precluded the procedure being completed under local anesthesia.   DESCRIPTION OF PROCEDURE: Patient was brought to the Operating Room and after being positioned supine on the operating table, she underwent sedation. The right leg was elevated to allow access to the abscess cavity. The area was prepped with ChloraPrep and draped. A 3 cm incision was made with thick odorless purulence retrieved. Culture was obtained. Digital manipulation was used to break up loculations. A bulky dressing was applied and the patient taken to the recovery room in stable condition.   ____________________________ Robert Bellow, MD jwb:cms D: 10/08/2011 21:01:39 ET T: 10/09/2011 15:10:29 ET JOB#: [id number] cc: Robert Bellow, MD, <Dictator> Deborra Medina, MD Yancey Pedley Amedeo Kinsman MD ELECTRONICALLY SIGNED 10/12/2011 17:46

## 2014-09-22 NOTE — Discharge Summary (Signed)
PATIENT NAME:  Jodi Colon, CAMERA MR#:  [id number] DATE OF BIRTH:  1930/03/25  DATE OF ADMISSION:  10/08/2011 DATE OF DISCHARGE:  10/11/2011  HISTORY: For a detailed note, please take a look at the history and physical done on admission by Dr. Dagoberto Ligas.   DIAGNOSES AT DISCHARGE:  1. Right gluteal cellulitis/abscess status post incision and drainage. 2. Methicillin Resistant Staphylococcus Aureus gluteal abscess. 3. Hypertension. 4. Hyperlipidemia. 5. History of previous cerebrovascular accident.  6. Gastroesophageal reflux disease.   DIET: The patient is being discharged on a low-sodium diet.   ACTIVITY: As tolerated.   FOLLOW-UP:  1. Dr. Bary Castilla in the next 1 to 2 weeks.  2. Also, follow up with Dr. Derrel Nip in the next 1 to 2 weeks.   DISCHARGE MEDICATIONS:  1. Topamax 25 mg 3 tabs at bedtime.  2. Gabapentin 100 milligrams t.i.d.  3. Protonix 40 mg daily.  4. Multivitamin daily.  5. Niacin 500 mg daily.  6. Plavix 75 mg daily.  7. Lisinopril 40 mg daily.  8. Lipitor 80 mg daily.  9. Cymbalta 60 mg daily.  10. Aspirin 81 mg daily.  11. Synthroid 50 mcg daily.  12. Paxil 20 mg daily.  13. Tizanidine daily.  14. Bactrim double strength 1 tab b.i.d. x10 days.  15. Norco 5/325, one to two tabs q.6h. p.r.n. pain.   CONSULTANTS DURING THE HOSPITAL COURSE: Dr. Hervey Ard from general surgery.   HOSPITAL COURSE: This is a 81 year old female with medical problems as mentioned above who presented to the hospital due to right gluteal erythema and warmth with abscess.  1. Right gluteal abscess/cellulitis. The patient was empirically started on IV vancomycin. A surgical consult was obtained. The patient was seen by Dr. Bary Castilla who performed an incision and drainage on the patient on 10/08/2011. Post incision and drainage, the patient's clinical symptoms have significantly improved. Her redness has come down. Her wound cultures did grow out Methicillin Resistant Staphylococcus Aureus.  It was sensitive to vancomycin, although she currently is being discharged on a p.o. course of Bactrim with close follow-up with Dr. Bary Castilla as an outpatient. She currently is afebrile and hemodynamically stable with her white cell count being also normal.  2. Methicillin Resistant Staphylococcus Aureus abscess. As mentioned, this was drained by Dr. Bary Castilla on 05/10. She has clinically improved since then. She received IV vancomycin in the hospital, but currently is being discharged on p.o. Bactrim.  3. History of previous cerebrovascular accident. The patient was maintained on her aspirin, Plavix and Lipitor and she will resume that.  4. Hypertension: The patient remained hemodynamically stable on her lisinopril and she will resume that.  5. Gastroesophageal reflux disease. The patient was maintained on her Protonix. She will resume that upon discharge.  6. Anxiety/depression. The patient was maintained on Cymbalta and Paxil and she will resume that upon discharge too.   CODE STATUS: The patient is a FULL CODE.   TIME SPENT: 40 minutes.  ____________________________ Belia Heman. Verdell Carmine, MD vjs:ap D: 10/11/2011 15:22:46 ET            T: 10/12/2011 13:53:54 ET                  JOB#: [id number] cc: Belia Heman. Verdell Carmine, MD, <Dictator> Robert Bellow, MD Deborra Medina, MD Henreitta Leber MD ELECTRONICALLY SIGNED 10/12/2011 16:44

## 2014-09-22 NOTE — H&P (Signed)
PATIENT NAME:  Jodi Colon, Jodi Colon MR#:  [id number] DATE OF BIRTH:  [birth year]  DATE OF ADMISSION:  10/08/2011  PRIMARY CARE PHYSICIAN: Deborra Medina, MD    REASON FOR ADMISSION: Right gluteal cellulitis with abscess.   HISTORY OF PRESENT ILLNESS: The patient is a pleasant 81 year old female with past medical history as listed below who presents as a direct admit from Dr. Lupita Dawn office due to the above-mentioned reason. The patient states that last Friday she noticed a spot on her right buttocks which was somewhat tender to palpation. The patient is currently awaiting surgery for rectal prolapse, and she went to see her physician in [place], [place] for evaluation of her rectal prolapse this past Monday and was told that surgery would have to be postponed due to a right buttocks infection. Since that time, the area of her right buttocks has become a lot more inflamed and is draining a small amount of blood; and she has also developed chills. She went to see Dr. Derrel Nip today, and there was a concern for right gluteal cellulitis with underlying abscess; and the patient was advised to come to the hospital for IV antibiotics and for surgical evaluation for I and D. The patient has a history of MRSA infection in the past. She arrived in hemodynamically stable condition. She has been given some IV morphine, and since that time her pain has improved. Initial pain was 10 out of 10 in the right buttock region  and now it is 3 out of 10 after IV morphine. She is currently afebrile. Otherwise, she is without specific complaints at this time.   PAST SURGICAL HISTORY:  1. Polyps removed from bladder in June 2011.  2. Cataract extraction.  3. Bilateral knee surgeries.  4. Unspecified brain surgery.  5. Breast surgery.  6. Right hand surgery. 7. Back surgery. 8. Left ankle surgery.  9. Cholecystectomy.  10. Partial hysterectomy.    PAST MEDICAL HISTORY:  1. History of breast  cancer. 2. Hypothyroidism. 3. Gastroesophageal reflux disease. 4. Transient ischemic attack/cerebrovascular accident. 5. History of MRSA infection.  6. Hypertension.   ALLERGIES: Penicillin causes a rash and pruritus. She is also allergic to sulfa and Septra.   HOME MEDICATIONS:  1. Aspirin 81 mg a day.   2. Lipitor 80 mg daily.  3. Calcium carbonate/vitamin D 600 mg/200 units, 1 tab p.o. daily.  4. Plavix 75 mg daily.  5. Cymbalta 60 mg daily.  6. Gabapentin 100 mg p.o. t.i.d. 7. Vicodin 5 /500 mg, 1 tab p.o. every 6 hours p.r.n.  8. Levothyroxine 50 mcg daily.  9. Lisinopril 40 mg daily.  10. Multivitamin 1 tablet daily.  11. Niaspan 500 mg p.o. at bedtime.  12. Protonix 40 mg daily. 13. Paroxetine 20 mg daily.  14. Topamax 25 mg 2 tablets at bedtime.  15. Tizanidine once a day.   FAMILY HISTORY: Mother and father both deceased with history of cerebrovascular accident.    SOCIAL HISTORY: Negative for tobacco, alcohol, or illicit drug use. The patient lives in [place], [place], and she is a widow. She has three children.    REVIEW OF SYSTEMS: CONSTITUTIONAL: Has chills. Denies fevers or recent changes in weight or weakness. Has some right buttock pain. HEAD/EYES: Denies headache or blurry vision. ENT: Denies tinnitus, earache, nasal discharge, or sore throat. RESPIRATORY: Denies shortness breath, cough, or wheeze. CARDIOVASCULAR: Denies chest pain, heart palpitations or lower extremity edema. GASTROINTESTINAL: Denies nausea, vomiting, diarrhea, constipation, melena, hematochezia, abdominal pain. GENITOURINARY: Denies dysuria,  hematuria. ENDOCRINE: Denies heat or cold intolerance.  HEMATOLOGIC/LYMPHATIC: Denies any bruising or bleeding. INTEGUMENT: Denies rash or lesions. MUSCULOSKELETAL: Denies joint pain or back pain or muscle weakness. NEUROLOGIC: Denies headache, numbness, weakness, tingling, or dysarthria. PSYCHIATRIC: Has underlying depression. Denies anxiety. Denies  suicidal or homicidal ideation.   PHYSICAL EXAMINATION:  VITAL SIGNS: Temperature 97.7, pulse 78, blood pressure 148/74, respirations 18, oxygen saturation 100% on room air.   GENERAL: Pleasant female, alert and oriented, in mild distress secondary to pain.   HEENT: Normocephalic, atraumatic. Pupils are equal, round and reactive to light and accommodation. Extraocular muscles are intact. Anicteric sclerae. Conjunctivae pink. Hearing intact to voice. Nares without drainage. Oral mucosa moist without lesion.   NECK: Supple with full range of motion. No jugular venous distention, lymphadenopathy, or carotid bruits bilaterally. No thyromegaly. No tenderness to palpation over the thyroid gland.   CHEST: Normal respiratory effort without use of accessory respiratory muscles.   LUNGS: Clear to auscultation bilaterally without crackles, rales, or wheezes.   CARDIOVASCULAR: S1, S2 positive. Regular rate and rhythm. No murmurs, rubs or gallops. PMI is nonlateralized..   ABDOMEN: Soft, nontender, nondistended. Normoactive bowel sounds. No hepatosplenomegaly or palpable masses. No hernias.   EXTREMITIES: No clubbing, cyanosis, or edema. Pedal pulses are palpable bilaterally.   SKIN: Left gluteal region with significant erythema and warmth which is exquisitely tender to palpation. There is blanching erythema. There is a small area which has spontaneously opened and is draining some purulent and sanguinous material. There is some surrounding induration and fluctuance. Skin turgor is good.   LYMPH: No cervical lymphadenopathy. The patient has some right inguinal adenopathy.   NEUROLOGICAL: The patient is alert and oriented x3. Cranial nerves II through XII are grossly intact.  PSYCHIATRIC: Pleasant female with appropriate affect.   LABORATORY, DIAGNOSTIC AND RADIOLOGICAL DATA: No labs are currently available in our system.   IMPRESSION: A 81 year old female with past medical history listed above, here  with right gluteal pain, erythema, warmth, mild drainage, as well as chills with:  1. Right gluteal cellulitis with abscess.  2. History of MRSA infection.  3. Hypertension.  4. History of transient ischemic attacks/cerebrovascular accident.  5. History of gastroesophageal reflux disease.  6. History of depression.   PLAN:  1. I agree with hospital admission.  2. Start IV antibiotics (will administer IV vancomycin given history of MRSA infection, and the patient is penicillin allergic; in the event of gram-negative organisms, we will also keep her on  ciprofloxacin) and also provide pain control.  3. Obtain surgical consultation to arrange incision and drainage, and to obtain wound culture, and for further recommendations.  4. Continue lisinopril for blood pressure control, as well as p.r.n. IV labetalol, as well as pain control.  5. For history of transient ischemic attack and cerebrovascular accident, we will hold aspirin and Plavix for now in preparation of anticipated I and D.  6. For gastroesophageal reflux disease, we will continue PPI therapy.  7. For depression, we will continue Cymbalta and Paxil.  8. For hypothyroidism, we will continue Synthroid.  9. Deep venous thrombosis prophylaxis with SCDs and TEDs.   CODE STATUS:  FULL CODE.     The case was discussed with Dr. Derrel Nip.        TIME SPENT ON ADMISSION: Approximately 60 minutes. .   ____________________________ Romie Jumper, MD knl:cbb D: 10/08/2011 15:41:07 ET T: 10/08/2011 18:16:36 ET JOB#: [id number]  cc: Romie Jumper, MD, <Dictator> Deborra Medina, MD [facility] Nadalyn Deringer  MD ELECTRONICALLY SIGNED 10/21/2011 15:05

## 2015-01-14 ENCOUNTER — Other Ambulatory Visit: Payer: Self-pay | Admitting: Family Medicine

## 2015-01-14 DIAGNOSIS — I671 Cerebral aneurysm, nonruptured: Secondary | ICD-10-CM

## 2015-01-14 DIAGNOSIS — R55 Syncope and collapse: Secondary | ICD-10-CM

## 2015-01-20 ENCOUNTER — Ambulatory Visit: Payer: Self-pay

## 2015-01-21 ENCOUNTER — Other Ambulatory Visit (HOSPITAL_COMMUNITY): Payer: Self-pay | Admitting: Interventional Radiology

## 2015-01-21 DIAGNOSIS — I729 Aneurysm of unspecified site: Secondary | ICD-10-CM

## 2015-01-23 ENCOUNTER — Ambulatory Visit (HOSPITAL_COMMUNITY)
Admission: RE | Admit: 2015-01-23 | Discharge: 2015-01-23 | Disposition: A | Discharge status: Home / Self Care | Payer: Medicare Other | Source: Ambulatory Visit | Attending: Family Medicine | Admitting: Family Medicine

## 2015-01-29 DIAGNOSIS — M5136 Other intervertebral disc degeneration, lumbar region: Secondary | ICD-10-CM | POA: Insufficient documentation

## 2015-01-29 DIAGNOSIS — M47816 Spondylosis without myelopathy or radiculopathy, lumbar region: Secondary | ICD-10-CM | POA: Insufficient documentation

## 2015-02-06 ENCOUNTER — Ambulatory Visit (HOSPITAL_COMMUNITY)
Admission: RE | Admit: 2015-02-06 | Discharge: 2015-02-06 | Disposition: A | Discharge status: Home / Self Care | Payer: Medicare Other | Source: Ambulatory Visit | Attending: Interventional Radiology | Admitting: Interventional Radiology

## 2015-02-06 ENCOUNTER — Other Ambulatory Visit: Payer: Self-pay | Admitting: Radiology

## 2015-02-06 DIAGNOSIS — I729 Aneurysm of unspecified site: Secondary | ICD-10-CM

## 2015-02-06 LAB — PLATELET INHIBITION P2Y12: Platelet Function  P2Y12: 130 [PRU] — ABNORMAL LOW (ref 194–418)

## 2015-02-09 ENCOUNTER — Inpatient Hospital Stay (HOSPITAL_COMMUNITY)
Admission: AD | Admit: 2015-02-09 | Discharge: 2015-02-12 | DRG: 083 | Disposition: A | Discharge status: Skilled Nursing Facility | Payer: Medicare Other | Source: Other Acute Inpatient Hospital | Attending: Neurosurgery | Admitting: Neurosurgery

## 2015-02-09 ENCOUNTER — Emergency Department: Payer: Medicare Other

## 2015-02-09 ENCOUNTER — Emergency Department
Admission: EM | Admit: 2015-02-09 | Discharge: 2015-02-09 | Disposition: A | Discharge status: Other Acute Inpatient Hospital | Payer: Medicare Other | Source: Home / Self Care | Attending: Emergency Medicine | Admitting: Emergency Medicine

## 2015-02-09 ENCOUNTER — Encounter: Payer: Self-pay | Admitting: Emergency Medicine

## 2015-02-09 ENCOUNTER — Encounter (HOSPITAL_COMMUNITY): Payer: Self-pay | Admitting: *Deleted

## 2015-02-09 DIAGNOSIS — S065X9A Traumatic subdural hemorrhage with loss of consciousness of unspecified duration, initial encounter: Secondary | ICD-10-CM | POA: Diagnosis present

## 2015-02-09 DIAGNOSIS — Z79899 Other long term (current) drug therapy: Secondary | ICD-10-CM

## 2015-02-09 DIAGNOSIS — Z882 Allergy status to sulfonamides status: Secondary | ICD-10-CM | POA: Diagnosis not present

## 2015-02-09 DIAGNOSIS — W19XXXA Unspecified fall, initial encounter: Secondary | ICD-10-CM | POA: Diagnosis present

## 2015-02-09 DIAGNOSIS — Z7902 Long term (current) use of antithrombotics/antiplatelets: Secondary | ICD-10-CM | POA: Diagnosis not present

## 2015-02-09 DIAGNOSIS — I6789 Other cerebrovascular disease: Secondary | ICD-10-CM | POA: Diagnosis not present

## 2015-02-09 DIAGNOSIS — Z8673 Personal history of transient ischemic attack (TIA), and cerebral infarction without residual deficits: Secondary | ICD-10-CM | POA: Diagnosis not present

## 2015-02-09 DIAGNOSIS — I10 Essential (primary) hypertension: Secondary | ICD-10-CM | POA: Diagnosis present

## 2015-02-09 DIAGNOSIS — Y92013 Bedroom of single-family (private) house as the place of occurrence of the external cause: Secondary | ICD-10-CM

## 2015-02-09 DIAGNOSIS — E785 Hyperlipidemia, unspecified: Secondary | ICD-10-CM | POA: Diagnosis present

## 2015-02-09 DIAGNOSIS — E871 Hypo-osmolality and hyponatremia: Secondary | ICD-10-CM | POA: Diagnosis not present

## 2015-02-09 DIAGNOSIS — S066X9A Traumatic subarachnoid hemorrhage with loss of consciousness of unspecified duration, initial encounter: Secondary | ICD-10-CM | POA: Diagnosis present

## 2015-02-09 DIAGNOSIS — Z880 Allergy status to penicillin: Secondary | ICD-10-CM | POA: Diagnosis not present

## 2015-02-09 DIAGNOSIS — Z881 Allergy status to other antibiotic agents status: Secondary | ICD-10-CM

## 2015-02-09 DIAGNOSIS — H9190 Unspecified hearing loss, unspecified ear: Secondary | ICD-10-CM | POA: Diagnosis present

## 2015-02-09 DIAGNOSIS — S0181XA Laceration without foreign body of other part of head, initial encounter: Secondary | ICD-10-CM | POA: Diagnosis present

## 2015-02-09 DIAGNOSIS — I615 Nontraumatic intracerebral hemorrhage, intraventricular: Secondary | ICD-10-CM | POA: Diagnosis not present

## 2015-02-09 DIAGNOSIS — I639 Cerebral infarction, unspecified: Secondary | ICD-10-CM

## 2015-02-09 DIAGNOSIS — I609 Nontraumatic subarachnoid hemorrhage, unspecified: Secondary | ICD-10-CM | POA: Diagnosis not present

## 2015-02-09 DIAGNOSIS — I679 Cerebrovascular disease, unspecified: Secondary | ICD-10-CM | POA: Diagnosis not present

## 2015-02-09 DIAGNOSIS — Z8614 Personal history of Methicillin resistant Staphylococcus aureus infection: Secondary | ICD-10-CM | POA: Diagnosis not present

## 2015-02-09 DIAGNOSIS — S065XAA Traumatic subdural hemorrhage with loss of consciousness status unknown, initial encounter: Secondary | ICD-10-CM

## 2015-02-09 DIAGNOSIS — D72829 Elevated white blood cell count, unspecified: Secondary | ICD-10-CM

## 2015-02-09 DIAGNOSIS — Z7982 Long term (current) use of aspirin: Secondary | ICD-10-CM

## 2015-02-09 LAB — TROPONIN I: Troponin I: <0.03 ng/mL (ref <0.031)

## 2015-02-09 LAB — CBC
HCT: 36.3 % (ref 35.0–47.0)
Hemoglobin: 12.2 g/dL (ref 12.0–16.0)
MCH: 29.7 pg (ref 26.0–34.0)
MCHC: 33.7 g/dL (ref 32.0–36.0)
MCV: 88.2 fL (ref 80.0–100.0)
Platelets: 203 10*3/uL (ref 150–440)
RBC: 4.11 MIL/uL (ref 3.80–5.20)
RDW: 14.3 % (ref 11.5–14.5)
WBC: 12.7 10*3/uL — ABNORMAL HIGH (ref 3.6–11.0)

## 2015-02-09 LAB — COMPREHENSIVE METABOLIC PANEL
ALT: 25 U/L (ref 14–54)
AST: 42 U/L — ABNORMAL HIGH (ref 15–41)
Albumin: 4.4 g/dL (ref 3.5–5.0)
Alkaline Phosphatase: 101 U/L (ref 38–126)
Anion gap: 9 (ref 5–15)
BUN: 14 mg/dL (ref 6–20)
CO2: 26 mmol/L (ref 22–32)
Calcium: 9 mg/dL (ref 8.9–10.3)
Chloride: 88 mmol/L — ABNORMAL LOW (ref 101–111)
Creatinine, Ser: 0.63 mg/dL (ref 0.44–1.00)
GFR calc Af Amer: >60 mL/min (ref >60)
GFR calc non Af Amer: >60 mL/min (ref >60)
Glucose, Bld: 123 mg/dL — ABNORMAL HIGH (ref 65–99)
Potassium: 3.9 mmol/L (ref 3.5–5.1)
Sodium: 123 mmol/L — ABNORMAL LOW (ref 135–145)
Total Bilirubin: 1 mg/dL (ref 0.3–1.2)
Total Protein: 7 g/dL (ref 6.5–8.1)

## 2015-02-09 LAB — ACETAMINOPHEN LEVEL: Acetaminophen (Tylenol), Serum: <10 ug/mL — ABNORMAL LOW (ref 10–30)

## 2015-02-09 LAB — CK: Total CK: 571 U/L — ABNORMAL HIGH (ref 38–234)

## 2015-02-09 LAB — MRSA PCR SCREENING: MRSA by PCR: NEGATIVE

## 2015-02-09 MED ORDER — ONDANSETRON HCL 4 MG/2ML IJ SOLN
4.0000 mg | Freq: Four times a day (QID) | INTRAMUSCULAR | Status: DC | PRN
Start: 2015-02-09 — End: 2015-02-12
  Administered 2015-02-09: 4 mg via INTRAVENOUS
  Filled 2015-02-09: qty 2

## 2015-02-09 MED ORDER — HYDROCORTISONE ACETATE 25 MG RE SUPP
25.0000 mg | Freq: Two times a day (BID) | RECTAL | Status: DC
  Filled 2015-02-09 (×3): qty 1

## 2015-02-09 MED ORDER — ADULT MULTIVITAMIN W/MINERALS CH
1.0000 | ORAL_TABLET | Freq: Every day | ORAL | Status: DC
  Administered 2015-02-10 – 2015-02-12 (×3): 1 via ORAL
  Filled 2015-02-09 (×4): qty 1

## 2015-02-09 MED ORDER — PANTOPRAZOLE SODIUM 40 MG PO TBEC
40.0000 mg | DELAYED_RELEASE_TABLET | Freq: Every day | ORAL | Status: DC
  Administered 2015-02-10 – 2015-02-12 (×3): 40 mg via ORAL
  Filled 2015-02-09 (×3): qty 1

## 2015-02-09 MED ORDER — PAROXETINE HCL 20 MG PO TABS
20.0000 mg | ORAL_TABLET | Freq: Every day | ORAL | Status: DC
  Administered 2015-02-10 – 2015-02-12 (×3): 20 mg via ORAL
  Filled 2015-02-09 (×4): qty 1

## 2015-02-09 MED ORDER — TOPIRAMATE 25 MG PO TABS
25.0000 mg | ORAL_TABLET | Freq: Every day | ORAL | Status: DC
  Administered 2015-02-10 – 2015-02-12 (×3): 25 mg via ORAL
  Filled 2015-02-09 (×3): qty 1

## 2015-02-09 MED ORDER — TIZANIDINE HCL 2 MG PO TABS
2.0000 mg | ORAL_TABLET | Freq: Four times a day (QID) | ORAL | Status: DC | PRN
  Administered 2015-02-12: 2 mg via ORAL
  Filled 2015-02-09 (×3): qty 1

## 2015-02-09 MED ORDER — GABAPENTIN 100 MG PO CAPS
100.0000 mg | ORAL_CAPSULE | Freq: Three times a day (TID) | ORAL | Status: DC
  Administered 2015-02-09 – 2015-02-12 (×8): 100 mg via ORAL
  Filled 2015-02-09 (×9): qty 1

## 2015-02-09 MED ORDER — NYSTATIN 100000 UNIT/ML MT SUSP
5.0000 mL | Freq: Four times a day (QID) | OROMUCOSAL | Status: DC
  Filled 2015-02-09 (×5): qty 5

## 2015-02-09 MED ORDER — ACETAMINOPHEN 325 MG PO TABS
650.0000 mg | ORAL_TABLET | ORAL | Status: DC | PRN
  Administered 2015-02-11: 650 mg via ORAL
  Filled 2015-02-09: qty 2

## 2015-02-09 MED ORDER — ASPIRIN 81 MG PO CHEW
81.0000 mg | CHEWABLE_TABLET | Freq: Every day | ORAL | Status: DC
  Administered 2015-02-10: 81 mg via ORAL
  Filled 2015-02-09 (×3): qty 1

## 2015-02-09 MED ORDER — LISINOPRIL 40 MG PO TABS: 40.0000 mg | ORAL_TABLET | Freq: Every day | ORAL | Status: DC

## 2015-02-09 MED ORDER — HYDROCODONE-ACETAMINOPHEN 7.5-325 MG PO TABS
1.0000 | ORAL_TABLET | ORAL | Status: DC | PRN
  Administered 2015-02-09 – 2015-02-12 (×4): 1 via ORAL
  Filled 2015-02-09 (×5): qty 1

## 2015-02-09 MED ORDER — ATORVASTATIN CALCIUM 80 MG PO TABS
80.0000 mg | ORAL_TABLET | Freq: Every day | ORAL | Status: DC
  Administered 2015-02-10 – 2015-02-11 (×2): 80 mg via ORAL
  Filled 2015-02-09 (×4): qty 1

## 2015-02-09 MED ORDER — ECONAZOLE NITRATE 1 % EX CREA
TOPICAL_CREAM | Freq: Two times a day (BID) | CUTANEOUS | Status: DC
  Filled 2015-02-09: qty 15

## 2015-02-09 MED ORDER — LEVOTHYROXINE SODIUM 25 MCG PO TABS
25.0000 ug | ORAL_TABLET | Freq: Every day | ORAL | Status: DC
  Administered 2015-02-10 – 2015-02-12 (×3): 25 ug via ORAL
  Filled 2015-02-09 (×4): qty 1

## 2015-02-09 MED ORDER — SODIUM CHLORIDE 0.9 % IV SOLN
Freq: Once | INTRAVENOUS | Status: AC
  Administered 2015-02-09: 17:00:00 via INTRAVENOUS

## 2015-02-09 MED ORDER — ONDANSETRON HCL 4 MG PO TABS: 4.0000 mg | ORAL_TABLET | Freq: Four times a day (QID) | ORAL | Status: DC | PRN

## 2015-02-09 MED ORDER — KETOCONAZOLE 2 % EX CREA
TOPICAL_CREAM | Freq: Two times a day (BID) | CUTANEOUS | Status: DC
  Administered 2015-02-09: 23:00:00 via TOPICAL
  Filled 2015-02-09: qty 15

## 2015-02-09 MED ORDER — LISINOPRIL 20 MG PO TABS
40.0000 mg | ORAL_TABLET | Freq: Every day | ORAL | Status: DC
  Administered 2015-02-09 – 2015-02-12 (×4): 40 mg via ORAL
  Filled 2015-02-09 (×4): qty 1
  Filled 2015-02-09: qty 2
  Filled 2015-02-09: qty 1

## 2015-02-09 MED ORDER — POTASSIUM CHLORIDE 2 MEQ/ML IV SOLN
INTRAVENOUS | Status: DC
  Administered 2015-02-09: 23:00:00 via INTRAVENOUS
  Filled 2015-02-09 (×7): qty 1000

## 2015-02-09 NOTE — ED Notes (Signed)
Daughter at bedside, no distress noted, cont to monitor

## 2015-02-09 NOTE — ED Notes (Signed)
Pt comes into the ED via EMS with c/o fall.  Patient was found in her bedroom with blood smeared from her bathroom.  Unknown how long patient was down or if there was LOC.  Patient has apparent laceration over right eye and swollen lips.  Patient alert to person and time.  Pupils equal, strengths equal.  Slurred speech and right facial droop presented by EMS.  H/o tia.  Patient hypertensive and curretnly on plavix.  Reported to have a stent in the back of her head from previous TIA.  CBG 129 and NSR on EKG.

## 2015-02-09 NOTE — ED Provider Notes (Addendum)
[facility] Emergency Department Provider Note  Time seen: 1:59 PM  I have reviewed the triage vital signs and the nursing notes.   HISTORY  Chief Complaint Fall    HPI Jodi Mohs Colon is a 81 y.o. female with a past medical history of arthritis, hypertension, hyperlipidemia, brain aneurysm, CVA, who is here to department after a fall. According to the daughter the patient lives with the patient's grandson, who noted that he thought he heard something fall at 3:57 in the morning. He looked around the house but could not find anything. He does note that the patient's door was shut but he did not go in there. Around 12:30 today he noted that the patient had not gotten out of bed he has a with an program to check on her and found her on the floor. Patient was unable to get up on her own, complaining of right hip pain and head pain. Patient has a small laceration to her right forehead. It is unclear how long the patient was on the ground but we suspect approximately 8-10 hours. The daughter states she believes the patient has been taking too much of her pain medication, and she is approximately 24 pills short for where she should be currently. The son states he did note her taking 3 pills last night when she should've only been taking 2.Patient's only complaint today is mild right hip pain, worse with movement.     Past Medical History  Diagnosis Date  . Degenerative joint disease     [facility]  . Hypertension   . Hyperlipidemia   . MRSA (methicillin resistant Staphylococcus aureus) 2008    perineal abscess  . Brain aneurysm     small, followed with regular angiograms, Dr. Estanislado Pandy  . Spinal stenosis   . DCIS (ductal carcinoma in situ) of breast 2007    Right, s/p lumpectomy/xrt   . Cerebrovascular disease 2008    s/p PTCA/stent of MCA, Deveshwar  . History of MRSA infection     Patient Active Problem List   Diagnosis Date Noted  .  Hemorrhoids that prolapse with straining, but retract spontaneously 01/24/2013  . Scalp lesion 11/27/2012  . Rectal prolapse 11/27/2012  . Abscess, gluteal, right 10/10/2011  . Degenerative joint disease   . Cerebrovascular disease   . MRSA (methicillin resistant Staphylococcus aureus)   . Hyperlipidemia LDL goal < 100 04/02/2011    Past Surgical History  Procedure Laterality Date  . Cholecystectomy    . Colonoscopy  2013  . Hemorrhoid surgery  02/01/13    Current Outpatient Rx  Name  Route  Sig  Dispense  Refill  . aspirin 81 MG tablet   Oral   Take 81 mg by mouth daily.         Marland Kitchen atorvastatin (LIPITOR) 80 MG tablet   Oral   Take by mouth.         . clopidogrel (PLAVIX) 75 MG tablet   Oral   Take 1 tablet (75 mg total) by mouth daily.   30 tablet   3   . diazepam (VALIUM) 2 MG tablet      1-2  30 minutes before ESI #2         . econazole nitrate 1 % cream   Topical   Apply topically 2 (two) times daily. Apply to the affected area twice daily   60 g   3   . gabapentin (NEURONTIN) 100 MG capsule  Oral   Take by mouth.         Marland Kitchen HYDROcodone-acetaminophen (NORCO) 7.5-325 MG per tablet   Oral   Take by mouth.         . hydrocortisone (ANUSOL-HC) 25 MG suppository   Rectal   Place 1 suppository (25 mg total) rectally 2 (two) times daily. One suppository twice a day for 6 days, then daily for 12 days.   24 suppository   0   . EXPIRED: levothyroxine (SYNTHROID, LEVOTHROID) 50 MCG tablet      TAKE ONE TABLET BY MOUTH DAILY BUT TAKE TWO TABLETS ON SUNDAY         . levothyroxine (SYNTHROID, LEVOTHROID) 50 MCG tablet               . lisinopril (PRINIVIL,ZESTRIL) 40 MG tablet   Oral   Take 1 tablet (40 mg total) by mouth daily.   30 tablet   10   . Multiple Vitamin (MULTIVITAMIN) tablet   Oral   Take 1 tablet by mouth daily.           Marland Kitchen nystatin (MYCOSTATIN) 100000 UNIT/ML suspension      Swish in mouth and swallow one teaspoonful four  times a day.   150 mL   0   . pantoprazole (PROTONIX) 40 MG tablet   Oral   Take by mouth.         Marland Kitchen PARoxetine (PAXIL) 20 MG tablet   Oral   Take by mouth.         Corrie Dandy (KERYDIN) 5 % SOLN      Apply to affected toenails once daily   4 mL   11   . tiZANidine (ZANAFLEX) 2 MG tablet               . topiramate (TOPAMAX) 25 MG tablet   Oral   Take 25 mg by mouth.          . EXPIRED: traZODone (DESYREL) 100 MG tablet   Oral   Take by mouth.         . EXPIRED: venlafaxine XR (EFFEXOR-XR) 150 MG 24 hr capsule   Oral   Take by mouth.           Allergies Prednisone; Septra; Sulfasalazine; Penicillins; Sulfa antibiotics; and Sulfamethoxazole-trimethoprim  Family History  Problem Relation Age of Onset  . Fibromyalgia Daughter     Social History Social History  Substance Use Topics  . Smoking status: Never Smoker   . Smokeless tobacco: Never Used  . Alcohol Use: No    Review of Systems Constitutional: Negative for fever Cardiovascular: Negative for chest pain. Respiratory: Negative for shortness of breath. Gastrointestinal: Negative for abdominal pain Musculoskeletal: Negative for back pain. Negative for neck pain. Mild right hip pain. Neurological: Negative for headache 10-point ROS otherwise negative.  ____________________________________________   PHYSICAL EXAM:  VITAL SIGNS: ED Triage Vitals  Enc Vitals Group     BP 02/09/15 1342 186/67 mmHg     Pulse Rate 02/09/15 1342 85     Resp 02/09/15 1342 18     Temp 02/09/15 1342 97.7 F (36.5 C)     Temp Source 02/09/15 1342 Oral     SpO2 02/09/15 1342 100 %     Weight --      Height --      Head Cir --      Peak Flow --      Pain Score --  Pain Loc --      Pain Edu? --      Excl. in [place]? --    Constitutional: Alert and oriented. Well appearing and in no distress. Eyes: Normal exam ENT Head: 1.5 cm laceration to the right eyebrow, hemostatic   Mouth/Throat: Mucous  membranes are moist. Cardiovascular: Normal rate, regular rhythm. No murmur Respiratory: Normal respiratory effort without tachypnea nor retractions. Breath sounds are clear and equal bilaterally. No wheezes/rales/rhonchi. Gastrointestinal: Soft and nontender. No distention.   Musculoskeletal: Mild right hip tenderness palpation, worse with attempted range of motion, but the patient is able to range her hip but with pain. Neurovascular intact distally. Left hip is normal. All other extremities appear atraumatic on exam.  No back or neck tenderness Neurologic:  Normal speech and language. No gross focal neurologic deficits Skin:  Skin is warm, dry and intact.  Psychiatric: Mood and affect are normal. Speech and behavior are normal. ____________________________________________    EKG  EKG reviewed and interpreted by myself shows normal sinus rhythm at 85 bpm, slightly widened QRS, left axis deviation, RSR pattern most consistent with a right bundle branch block. Nonspecific ST changes present.  ____________________________________________    RADIOLOGY  CT consistent with subarachnoid and subdural hematoma. X-ray negative for hip fracture. CT C-spine negative. ____________________________________________   INITIAL IMPRESSION / ASSESSMENT AND PLAN / ED COURSE  Pertinent labs & imaging results that were available during my care of the patient were reviewed by me and considered in my medical decision making (see chart for details).   Patient with fall, unknown down time. We'll check CT head, C-spine, right hip x-ray to help further evaluate. CT C-spine is negative. Hip x-ray shows degenerative changes without fracture. Lab work is pending at this time. CT head shows minor subarachnoid hemorrhage and subdural hematoma. We'll discuss with [facility] for transfer.  Daughter states the patient's MCA stent was placed at [facility] and a request transport back to [facility]. I discussed with Dr.  Vertell Limber at [facility], who has accepted the patient to the neuro ICU. We will transfer the patient for further workup and treatment. Labs are currently pending.  CRITICAL CARE Performed by: Harvest Dark   Total critical care time: 30 minutes  Critical care time was exclusive of separately billable procedures and treating other patients.  Critical care was necessary to treat or prevent imminent or life-threatening deterioration.  Critical care was time spent personally by me on the following activities: development of treatment plan with patient and/or surrogate as well as nursing, discussions with consultants, evaluation of patient's response to treatment, examination of patient, obtaining history from patient or surrogate, ordering and performing treatments and interventions, ordering and review of laboratory studies, ordering and review of radiographic studies, pulse oximetry and re-evaluation of patient's condition.  ____________________________________________   FINAL CLINICAL IMPRESSION(S) / ED DIAGNOSES  Fall Subarachnoid hemorrhage Subdural hematoma  Harvest Dark, MD 02/09/15 1606  Harvest Dark, MD 02/09/15 [phone]

## 2015-02-09 NOTE — ED Notes (Signed)
Report given to mary at [place], [place] bed 1

## 2015-02-09 NOTE — Consult Note (Signed)
Reason for Consult:SAH, ICH after fall, on plavix Referring Physician: Grayce Colon is an 81 y.o. female.  HPI: Jodi Colon is a 81 y.o. female with a past medical history of arthritis, hypertension, hyperlipidemia, brain aneurysm, CVA, who is here to department after a fall. According to the daughter the patient lives with the patient's grandson, who noted that he thought he heard something fall at 3:57 in the morning. He looked around the house but could not find anything. He does note that the patient's door was shut but he did not go in there. Around 12:30 today he noted that the patient had not gotten out of bed he has a with an program to check on her and found her on the floor. Patient was unable to get up on her own, complaining of right hip pain and head pain. Patient has a small laceration to her right forehead. It is unclear how long the patient was on the ground but we suspect approximately 8-10 hours. The daughter states she believes the patient has been taking too much of her pain medication, and she is approximately 24 pills short for where she should be currently. The son states he did note her taking 3 pills last night when she should've only been taking 2.Patient's only complaint today is mild right hip pain, worse with movement. Head CT was obtained, which shows small amount of ICH and SAH   Past Medical History  Diagnosis Date  . Degenerative joint disease     [facility]  . Hypertension   . Hyperlipidemia   . MRSA (methicillin resistant Staphylococcus aureus) 2008    perineal abscess  . Brain aneurysm     small, followed with regular angiograms, Dr. Estanislado Pandy  . Spinal stenosis   . DCIS (ductal carcinoma in situ) of breast 2007    Right, s/p lumpectomy/xrt   . Cerebrovascular disease 2008    s/p PTCA/stent of MCA, Deveshwar  . History of MRSA infection     Past Surgical History  Procedure Laterality Date  . Cholecystectomy    .  Colonoscopy  2013  . Hemorrhoid surgery  02/01/13    Family History  Problem Relation Age of Onset  . Fibromyalgia Daughter     Social History:  reports that she has never smoked. She has never used smokeless tobacco. She reports that she does not drink alcohol or use illicit drugs.  Allergies:  Allergies  Allergen Reactions  . Prednisone     Patient reported this date that she was allergic to PCN, rather than Prednisone.  Patient reported this date that she was allergic to PCN, rather than Prednisone.   . Septra [Bactrim]   . Sulfasalazine   . Penicillins Rash    Rash Rash  . Sulfa Antibiotics Rash  . Sulfamethoxazole-Trimethoprim Rash    Medications: I have reviewed the patient's current medications.  Results for orders placed or performed during the hospital encounter of 02/09/15 (from the past 48 hour(s))  MRSA PCR Screening     Status: None   Collection Time: 02/09/15  7:13 PM  Result Value Ref Range   MRSA by PCR NEGATIVE NEGATIVE    Comment:        The GeneXpert MRSA Assay (FDA approved for NASAL specimens only), is one component of a comprehensive MRSA colonization surveillance program. It is not intended to diagnose MRSA infection nor to guide or monitor treatment for MRSA infections.     Ct Head  Wo Contrast  02/09/2015   CLINICAL DATA:  Patient found at home after an apparent fall. Reported laceration over the RIGHT eye. Slurred speech and RIGHT facial droop reported.  EXAM: CT HEAD WITHOUT CONTRAST  CT CERVICAL SPINE WITHOUT CONTRAST  TECHNIQUE: Multidetector CT imaging of the head and cervical spine was performed following the standard protocol without intravenous contrast. Multiplanar CT image reconstructions of the cervical spine were also generated.  COMPARISON:  CT head 09/20/2013.  Cerebral angiogram 07/17/2010.  FINDINGS: CT HEAD FINDINGS  Over the RIGHT frontal convexity, there are suspected areas of subarachnoid and/or subdural blood, relatively small,  without significant mass effect on the underlying brain. These were not clearly present on most recent prior, and could suggest sequelae of mild closed head injury in this patient on anticoagulation (Plavix). No significant parenchymal hemorrhage is evident.  Mild atrophy consistent with patient's stated age. Chronic microvascular ischemic change. There is a RIGHT MCA stent unchanged from priors.  No features suggestive of acute infarction. No parenchymal hemorrhage. No hydrocephalus or intra-axial mass lesion.  Calvarium is intact. Vascular calcification is noted. No sinus or mastoid air fluid level. No significant scalp or periorbital soft tissue swelling is evident.  CT CERVICAL SPINE FINDINGS  There is no visible cervical spine fracture, traumatic subluxation, prevertebral soft tissue swelling, or intraspinal hematoma. Mild straightening of the normal cervical lordosis. Mild disc space narrowing at C5-6 and C6-7. Mild facet arthropathy. Carotid atherosclerosis. No lung apex lesion. No neck masses.  IMPRESSION: Suspected areas of relatively minor RIGHT frontal subarachnoid and/or subdural posttraumatic hemorrhage, without significant mass effect on the underlying brain. These are felt to be posttraumatic reflecting closed head injury. No suggestion of aneurysmal SAH. No associated skull fracture or parenchymal hemorrhage.  No acute stroke is evident.  Cervical spondylosis without fracture or traumatic subluxation.  Findings discussed with ordering provider.   Electronically Signed   By: Staci Righter M.D.   On: 02/09/2015 15:18   Ct Cervical Spine Wo Contrast  02/09/2015   CLINICAL DATA:  Patient found at home after an apparent fall. Reported laceration over the RIGHT eye. Slurred speech and RIGHT facial droop reported.  EXAM: CT HEAD WITHOUT CONTRAST  CT CERVICAL SPINE WITHOUT CONTRAST  TECHNIQUE: Multidetector CT imaging of the head and cervical spine was performed following the standard protocol without  intravenous contrast. Multiplanar CT image reconstructions of the cervical spine were also generated.  COMPARISON:  CT head 09/20/2013.  Cerebral angiogram 07/17/2010.  FINDINGS: CT HEAD FINDINGS  Over the RIGHT frontal convexity, there are suspected areas of subarachnoid and/or subdural blood, relatively small, without significant mass effect on the underlying brain. These were not clearly present on most recent prior, and could suggest sequelae of mild closed head injury in this patient on anticoagulation (Plavix). No significant parenchymal hemorrhage is evident.  Mild atrophy consistent with patient's stated age. Chronic microvascular ischemic change. There is a RIGHT MCA stent unchanged from priors.  No features suggestive of acute infarction. No parenchymal hemorrhage. No hydrocephalus or intra-axial mass lesion.  Calvarium is intact. Vascular calcification is noted. No sinus or mastoid air fluid level. No significant scalp or periorbital soft tissue swelling is evident.  CT CERVICAL SPINE FINDINGS  There is no visible cervical spine fracture, traumatic subluxation, prevertebral soft tissue swelling, or intraspinal hematoma. Mild straightening of the normal cervical lordosis. Mild disc space narrowing at C5-6 and C6-7. Mild facet arthropathy. Carotid atherosclerosis. No lung apex lesion. No neck masses.  IMPRESSION: Suspected areas of  relatively minor RIGHT frontal subarachnoid and/or subdural posttraumatic hemorrhage, without significant mass effect on the underlying brain. These are felt to be posttraumatic reflecting closed head injury. No suggestion of aneurysmal SAH. No associated skull fracture or parenchymal hemorrhage.  No acute stroke is evident.  Cervical spondylosis without fracture or traumatic subluxation.  Findings discussed with ordering provider.   Electronically Signed   By: Staci Righter M.D.   On: 02/09/2015 15:18   Dg Hip Unilat With Pelvis 2-3 Views Right  02/09/2015   CLINICAL DATA:   Recent fall in bathroom with right hip pain, initial encounter  EXAM: DG HIP (WITH OR WITHOUT PELVIS) 2-3V RIGHT  COMPARISON:  None.  FINDINGS: Pelvic ring is intact. No acute fracture or dislocation is noted. No soft tissue abnormality is seen. Degenerative changes of the right hip joint are noted.  IMPRESSION: Degenerative change without acute abnormality.   Electronically Signed   By: Inez Catalina M.D.   On: 02/09/2015 14:46    Review of Systems - Negative except as above    Blood pressure 188/64, pulse 82, temperature 97.9 F (36.6 C), temperature source Oral, resp. rate 21, height 4\' 9"  (1.448 m), weight 51.9 kg (114 lb 6.7 oz), SpO2 100 %. Physical Exam  Constitutional: She appears well-developed and well-nourished.  HENT:  Head: Normocephalic.  Bruising right side of head and above right eye  Eyes: Conjunctivae and EOM are normal. Pupils are equal, round, and reactive to light.  Neck: Normal range of motion. Neck supple.  Cardiovascular: Normal rate and regular rhythm.   Respiratory: Effort normal and breath sounds normal.  GI: Soft. Bowel sounds are normal.  Musculoskeletal: Normal range of motion.  Neurological: She is alert. She has normal strength and normal reflexes. No cranial nerve deficit or sensory deficit. GCS eye subscore is 4. GCS verbal subscore is 5. GCS motor subscore is 6.  Skin: Skin is warm and dry.  Psychiatric: She has a normal mood and affect. Her behavior is normal. Judgment and thought content normal.    Assessment/Plan: Patient has right MCA stent with plavix.  She had fall and struck head.  She has small SAH and SDH.  I will not reverse antiplatelet agent, since this will lead to high risk of stroke.  Will hold plavix and observe in ICU.  Repeat head CT in AM.  Rakeisha Nyce D, MD 02/09/2015, 9:39 PM

## 2015-02-09 NOTE — ED Notes (Signed)
[facility] here to transport pt.   

## 2015-02-09 NOTE — ED Notes (Signed)
Patient asleep in bed. Laceration and abrasion noted to right side of face and eyebrow.  Swollen lip also noted.  Right arm and elbow abrasion.

## 2015-02-10 ENCOUNTER — Inpatient Hospital Stay (HOSPITAL_COMMUNITY): Payer: Medicare Other

## 2015-02-10 ENCOUNTER — Encounter (HOSPITAL_COMMUNITY): Payer: Self-pay | Admitting: Radiology

## 2015-02-10 DIAGNOSIS — I10 Essential (primary) hypertension: Secondary | ICD-10-CM

## 2015-02-10 DIAGNOSIS — I615 Nontraumatic intracerebral hemorrhage, intraventricular: Secondary | ICD-10-CM

## 2015-02-10 DIAGNOSIS — I609 Nontraumatic subarachnoid hemorrhage, unspecified: Secondary | ICD-10-CM

## 2015-02-10 DIAGNOSIS — I679 Cerebrovascular disease, unspecified: Secondary | ICD-10-CM

## 2015-02-10 DIAGNOSIS — I671 Cerebral aneurysm, nonruptured: Secondary | ICD-10-CM

## 2015-02-10 LAB — LIPID PANEL
Cholesterol: 180 mg/dL (ref 0–200)
HDL: 81 mg/dL (ref >40)
LDL Cholesterol: 86 mg/dL (ref 0–99)
Total CHOL/HDL Ratio: 2.2 RATIO
Triglycerides: 63 mg/dL (ref <150)
VLDL: 13 mg/dL (ref 0–40)

## 2015-02-10 LAB — BASIC METABOLIC PANEL
Anion gap: 10 (ref 5–15)
BUN: 7 mg/dL (ref 6–20)
CO2: 22 mmol/L (ref 22–32)
Calcium: 9.2 mg/dL (ref 8.9–10.3)
Chloride: 96 mmol/L — ABNORMAL LOW (ref 101–111)
Creatinine, Ser: 0.66 mg/dL (ref 0.44–1.00)
GFR calc Af Amer: >60 mL/min (ref >60)
GFR calc non Af Amer: >60 mL/min (ref >60)
Glucose, Bld: 115 mg/dL — ABNORMAL HIGH (ref 65–99)
Potassium: 4.1 mmol/L (ref 3.5–5.1)
Sodium: 128 mmol/L — ABNORMAL LOW (ref 135–145)

## 2015-02-10 LAB — CBC
HCT: 38.1 % (ref 36.0–46.0)
Hemoglobin: 12.9 g/dL (ref 12.0–15.0)
MCH: 29.8 pg (ref 26.0–34.0)
MCHC: 33.9 g/dL (ref 30.0–36.0)
MCV: 88 fL (ref 78.0–100.0)
Platelets: 263 10*3/uL (ref 150–400)
RBC: 4.33 MIL/uL (ref 3.87–5.11)
RDW: 13.8 % (ref 11.5–15.5)
WBC: 8.4 10*3/uL (ref 4.0–10.5)

## 2015-02-10 LAB — TSH: TSH: 2.7 u[IU]/mL (ref 0.350–4.500)

## 2015-02-10 MED ORDER — AMLODIPINE BESYLATE 10 MG PO TABS
10.0000 mg | ORAL_TABLET | Freq: Every day | ORAL | Status: DC
  Administered 2015-02-10 – 2015-02-12 (×3): 10 mg via ORAL
  Filled 2015-02-10 (×4): qty 1

## 2015-02-10 MED ORDER — DESMOPRESSIN ACETATE 0.2 MG PO TABS
0.2000 mg | ORAL_TABLET | Freq: Every day | ORAL | Status: DC
  Administered 2015-02-11: 0.2 mg via ORAL
  Filled 2015-02-10: qty 1

## 2015-02-10 NOTE — Evaluation (Addendum)
Physical Therapy Evaluation Patient Details Name: Jodi Colon MRN: [id number] DOB: 1930/04/12 Today's Date: 02/10/2015   History of Present Illness  pt is an 81 y/o female admitted after a fall in her bedroom, staying on the floor for an indeterminate amount of time, suffering a small ICH, SAH  Clinical Impression  Pt admitted with/for ich/SAH with mild gait instability.  Pt currently limited functionally due to the problems listed. ( See problems list.)   Pt will benefit from PT to maximize function and safety in order to get ready for next venue listed below.     Follow Up Recommendations SNF;Other (comment);Supervision/Assistance - 24 hour (could be managed at home with family 24 hour assist)    Equipment Recommendations       Recommendations for Other Services       Precautions / Restrictions Precautions Precautions: Fall      Mobility  Bed Mobility Overal bed mobility: Needs Assistance Bed Mobility: Supine to Sit     Supine to sit: Min assist     General bed mobility comments: needing assist to reposition in bed.  Transfers Overall transfer level: Needs assistance Equipment used: 1 person hand held assist Transfers: Sit to/from Stand Sit to Stand: Min assist         General transfer comment: stability assist/ assist to come forward.  Ambulation/Gait Ambulation/Gait assistance: Min assist Ambulation Distance (Feet): 15 Feet (then addidtional 50 feet) Assistive device: 1 person hand held assist Gait Pattern/deviations: Step-through pattern Gait velocity: slower   General Gait Details: unsteady thoughout, halting/guarded  Science writer    Modified Rankin (Stroke Patients Only)       Balance Overall balance assessment: Needs assistance Sitting-balance support: No upper extremity supported Sitting balance-Leahy Scale: Good Sitting balance - Comments: able to w/shift--reach outside BOS     Standing balance-Leahy  Scale: Poor                               Pertinent Vitals/Pain Pain Assessment: Faces Faces Pain Scale: Hurts little more Pain Location: head Pain Descriptors / Indicators: Grimacing Pain Intervention(s): Limited activity within patient's tolerance    Home Living Family/patient expects to be discharged to:: Private residence Living Arrangements: Other (Comment) (grand son lives with her.) Available Help at Discharge: Family Type of Home: House Home Access: Stairs to enter   Technical brewer of Steps: 1 Home Layout: One level Home Equipment: Cane - single point      Prior Function Level of Independence: Independent;Independent with assistive device(s) (just started using a cane)               Hand Dominance        Extremity/Trunk Assessment   Upper Extremity Assessment: Defer to OT evaluation           Lower Extremity Assessment: Overall WFL for tasks assessed (proximal and truncal weakness)         Communication   Communication: No difficulties  Cognition Arousal/Alertness: Awake/alert Behavior During Therapy: WFL for tasks assessed/performed Overall Cognitive Status: Impaired/Different from baseline Area of Impairment: Attention;Following commands;Safety/judgement   Current Attention Level: Selective (dystractable)   Following Commands: Follows one step commands consistently Safety/Judgement: Decreased awareness of deficits          General Comments General comments (skin integrity, edema, etc.): BP high in the upper 170's/80's  o/w VSS  Exercises        Assessment/Plan    PT Assessment Patient needs continued PT services  PT Diagnosis Difficulty walking;Generalized weakness   PT Problem List Decreased strength;Decreased activity tolerance;Decreased balance;Decreased mobility;Decreased coordination;Decreased knowledge of use of DME  PT Treatment Interventions DME instruction;Gait training;Functional mobility  training;Therapeutic activities;Balance training;Patient/family education   PT Goals (Current goals can be found in the Care Plan section) Acute Rehab PT Goals Patient Stated Goal: eventually be back independent PT Goal Formulation: With patient Time For Goal Achievement: 02/17/15 Potential to Achieve Goals: Good    Frequency Min 3X/week   Barriers to discharge        Co-evaluation               End of Session   Activity Tolerance: Patient tolerated treatment well;Patient limited by fatigue Patient left: in bed;with call bell/phone within reach;with family/visitor present Nurse Communication: Mobility status         Time: 3235-5732 PT Time Calculation (min) (ACUTE ONLY): 36 min   Charges:   PT Evaluation $Initial PT Evaluation Tier I: 1 Procedure PT Treatments $Gait Training: 8-22 mins   PT G Codes:        Dvon Jiles, Tessie Fass 02/10/2015, 12:17 PM  02/10/2015  Donnella Sham, PT [phone]  (pager)

## 2015-02-10 NOTE — Consult Note (Signed)
STROKE TEAM CONSULT NOTE   Referring Physician: Dr. Vertell Limber    Reason for consult: antiplatelet use  HPI: Jodi Colon is an 81 y.o. female with PMH of HTN, HLD, brain anueurysm, CVA, s/p right MCA stent who was admitted after a fall and found on the floor. She has right forehead small laceration. Pt was not able to get up by herself, and complaining of right hip pain and a HA. CT head showed small SAH at right frontal as well as bilateral trace IVH. She was admitted for further care.  She was seen by Dr. Estanislado Pandy back in 2008 for HA and MRI showed old lacunar strokes as well as right MCA high grade stenosis. She was also found to have 83mm right ICA aneurysm at that time. She received right M1 stent in 01/2007 and was put on ASA and plavix. She followed up with Dr. Estanislado Pandy through 2012 and found to have 40% stenosis right MCA previously stented portion as well as 40-50% stenosis of left MCA. Since then, she lost follow up. She is still on ASA and plavix. Given the traumatic SAH and trace IVH, neurology was consulted for antiplatelet use.   Past Medical History  Diagnosis Date  . Degenerative joint disease     [facility]  . Hypertension   . Hyperlipidemia   . MRSA (methicillin resistant Staphylococcus aureus) 2008    perineal abscess  . Brain aneurysm     small, followed with regular angiograms, Dr. Estanislado Pandy  . Spinal stenosis   . DCIS (ductal carcinoma in situ) of breast 2007    Right, s/p lumpectomy/xrt   . Cerebrovascular disease 2008    s/p PTCA/stent of MCA, Deveshwar  . History of MRSA infection     Past Surgical History  Procedure Laterality Date  . Cholecystectomy    . Colonoscopy  2013  . Hemorrhoid surgery  02/01/13    Family History  Problem Relation Age of Onset  . Fibromyalgia Daughter    Social History:  reports that she has never smoked. She has never used smokeless tobacco. She reports that she does not drink alcohol or use illicit  drugs.  Allergies:  Allergies  Allergen Reactions  . Prednisone     Patient reported this date that she was allergic to PCN, rather than Prednisone.  Patient reported this date that she was allergic to PCN, rather than Prednisone.   . Septra [Bactrim]   . Sulfasalazine   . Penicillins Rash    Rash Rash  . Sulfa Antibiotics Rash  . Sulfamethoxazole-Trimethoprim Rash    Medications:  Prior to Admission:  Prescriptions prior to admission  Medication Sig Dispense Refill Last Dose  . aspirin 81 MG tablet Take 81 mg by mouth daily.   Taking  . atorvastatin (LIPITOR) 80 MG tablet Take by mouth.   Taking  . clopidogrel (PLAVIX) 75 MG tablet Take 1 tablet (75 mg total) by mouth daily. 30 tablet 3 Taking  . diazepam (VALIUM) 2 MG tablet 1-2  30 minutes before ESI #2   Taking  . econazole nitrate 1 % cream Apply topically 2 (two) times daily. Apply to the affected area twice daily 60 g 3 Taking  . gabapentin (NEURONTIN) 100 MG capsule Take by mouth.   Taking  . HYDROcodone-acetaminophen (NORCO) 7.5-325 MG per tablet Take by mouth.   Taking  . hydrocortisone (ANUSOL-HC) 25 MG suppository Place 1 suppository (25 mg total) rectally 2 (two) times daily. One suppository twice a  day for 6 days, then daily for 12 days. 24 suppository 0 Taking  . levothyroxine (SYNTHROID, LEVOTHROID) 50 MCG tablet TAKE ONE TABLET BY MOUTH DAILY BUT TAKE TWO TABLETS ON SUNDAY   Taking  . levothyroxine (SYNTHROID, LEVOTHROID) 50 MCG tablet    Taking  . lisinopril (PRINIVIL,ZESTRIL) 40 MG tablet Take 1 tablet (40 mg total) by mouth daily. 30 tablet 10 Taking  . Multiple Vitamin (MULTIVITAMIN) tablet Take 1 tablet by mouth daily.     Taking  . nystatin (MYCOSTATIN) 100000 UNIT/ML suspension Swish in mouth and swallow one teaspoonful four times a day. 150 mL 0 Taking  . pantoprazole (PROTONIX) 40 MG tablet Take by mouth.   Taking  . PARoxetine (PAXIL) 20 MG tablet Take by mouth.   Taking  . tiZANidine (ZANAFLEX) 2 MG  tablet    Taking  . topiramate (TOPAMAX) 25 MG tablet Take 25 mg by mouth.    Taking  . traZODone (DESYREL) 100 MG tablet Take by mouth.   Taking  . venlafaxine XR (EFFEXOR-XR) 150 MG 24 hr capsule Take by mouth.   Taking    ROS: Review of systems not able to perform completely due to hard of hearing and mild lethargy. ROS done as able so far negative except mentioned in HPI.  Lab and imaging:  No results for input(s): GLUCAP in the last 168 hours.  Recent Labs Lab 02/09/15 1542  NA 123*  K 3.9  CL 88*  CO2 26  GLUCOSE 123*  BUN 14  CREATININE 0.63  CALCIUM 9.0    Recent Labs Lab 02/09/15 1542  AST 42*  ALT 25  ALKPHOS 101  BILITOT 1.0  PROT 7.0  ALBUMIN 4.4    Recent Labs Lab 02/09/15 1542  WBC 12.7*  HGB 12.2  HCT 36.3  MCV 88.2  PLT 203    Recent Labs Lab 02/09/15 1542  CKTOTAL 571*  TROPONINI <0.03   No results for input(s): LABPROT, INR in the last 72 hours. No results for input(s): COLORURINE, LABSPEC, [place], GLUCOSEU, HGBUR, BILIRUBINUR, KETONESUR, PROTEINUR, UROBILINOGEN, NITRITE, LEUKOCYTESUR in the last 72 hours.  Invalid input(s): APPERANCEUR     Component Value Date/Time   CHOL 147 04/14/2011 1050   TRIG 100.0 04/14/2011 1050   HDL 61.30 04/14/2011 1050   CHOLHDL 2 04/14/2011 1050   VLDL 20.0 04/14/2011 1050   LDLCALC 66 04/14/2011 1050   No results found for: HGBA1C No results found for: LABOPIA, COCAINSCRNUR, LABBENZ, AMPHETMU, THCU, LABBARB  No results for input(s): ETH in the last 168 hours.  I have personally reviewed the radiological images below and agree with the radiology interpretations.  Ct Head Wo Contrast  02/10/2015   IMPRESSION: 1. Stable to slightly decreased small volume acute subarachnoid and/or subdural hemorrhage overlying the right cerebral convexity. No significant mass effect. No new intracranial process. 2. Small volume hemorrhage within the occipital horns of both lateral ventricles, likely related to  redistribution, similar to prior. No hydrocephalus.   02/09/2015    IMPRESSION: Suspected areas of relatively minor RIGHT frontal subarachnoid and/or subdural posttraumatic hemorrhage, without significant mass effect on the underlying brain. These are felt to be posttraumatic reflecting closed head injury. No suggestion of aneurysmal SAH. No associated skull fracture or parenchymal hemorrhage.  No acute stroke is evident.  Cervical spondylosis without fracture or traumatic subluxation.    Ct Cervical Spine Wo Contrast  02/09/2015  IMPRESSION: Suspected areas of relatively minor RIGHT frontal subarachnoid and/or subdural posttraumatic hemorrhage, without significant mass effect  on the underlying brain. These are felt to be posttraumatic reflecting closed head injury. No suggestion of aneurysmal SAH. No associated skull fracture or parenchymal hemorrhage.  No acute stroke is evident.  Cervical spondylosis without fracture or traumatic subluxation.  Findings discussed with ordering provider.      Ir Radiologist Eval & Mgmt  02/10/2015    ASSESSMENT AND PLAN: The previous patient's her previous neuroimaging follow-up MRI scans MRA scans and a catheter angiograms were reviewed  Occasions the most recent neuroimaging follow-up study was a CT of the brain in 2015.  However in terms of evaluation of her vasculature her most recent recent angiogram was the if ever 2012  However given the patient's these symptoms and history as above a follow-up MRI of the brain and MRA of the brain is indicated to evaluate for interval changes in the brain parenchyma source ischemic stroke, and also to evaluate on the intracranial vasculature especially the superior posterior region aneurysm on the right at without MRA examination. Questions are answered to their satisfaction.  The MRI MRA of the brain will be scheduled scheduling the earliest possible  Should these he denied by her insurance company, the patient was counseled that  she may the [facility] catheter angiogram. Patient and the daughter leave with good understanding and agreement with above management plan.   Electronically Signed   By: Luanne Bras M.D.   On: 02/07/2015 15:56    Carotid Doppler  pending  2D Echocardiogram  pending   PHYSICAL EXAM  Temp:  [97.4 F (36.3 C)-98.4 F (36.9 C)] 98.3 F (36.8 C) (09/12 1115) Pulse Rate:  [67-91] 78 (09/12 1100) Resp:  [9-27] 19 (09/12 1100) BP: (98-202)/(37-117) 171/116 mmHg (09/12 1100) SpO2:  [93 %-100 %] 99 % (09/12 1100) Weight:  [114 lb 6.7 oz (51.9 kg)] 114 lb 6.7 oz (51.9 kg) (09/11 1856)  General - Well nourished, well developed, in no apparent distress, mild lethargy.  Ophthalmologic - fundi not visualized due to noncooperation.  Cardiovascular - Regular rate and rhythm with no murmur.  Neck - supple, no carotid bruits  Mental Status -  Level of arousal and orientation to time, place, and person were intact, mild lethargy. Language including expression, naming, repetition, comprehension was assessed and found intact. Fund of Knowledge was assessed and was impaired.  Cranial Nerves II - XII - II - Visual field intact OU. III, IV, VI - Extraocular movements intact. V - Facial sensation intact bilaterally. VII - Facial movement showed mild right facial droop. VIII - Hearing & vestibular intact bilaterally. X - Palate elevates symmetrically. XI - Chin turning & shoulder shrug intact bilaterally. XII - Tongue protrusion intact.  Motor Strength - The patient's strength was normal in all extremities and pronator drift was absent except right LE proximal 4/5 due to arthritic pain as per pt.  Bulk was normal and fasciculations were absent.   Motor Tone - Muscle tone was assessed at the neck and appendages and was normal.  Reflexes - The patient's reflexes were symmetrical in all extremities and she had no pathological reflexes.  Sensory - Light touch, temperature/pinprick were assessed and  were symmetrical.    Coordination - The patient had normal movements in the hands and feet with no ataxia or dysmetria.  Tremor was absent.  Gait and Station - deferred due to safety concerns.   ASSESSMENT/PLAN Jodi Colon is a 81 y.o. female with history of HTN, HLD, brain anueurysm, CVA, s/p right MCA stent who  was admitted for right small SAH and trace IVH s/p fall. Symptoms stable. Neurology consulted for antiplatelet use in the setting of traumatic hemorrhage.     Traumatic ICH:  Left frontal small SAH and bilateral trace IVH\  Repeat CT stable SAH and IVH  Pt was on home ASA and plavix, now on ASA  Agree with hold off plavix  Continue ASA (pt hemorrhage is stable on CT while on ASA)  Management TBI as per NSG  Right MCA stenting  Done in 2008 by Dr. Estanislado Pandy  Lost follow up in 2012  On home ASA and plavix  Agree to hold off plavix in light of traumatic hemorrhage.  Agree with Dr. Estanislado Pandy to repeat MRI and MRA  MRI  pending  MRA  pending  Carotid Doppler  pending  2D Echo  pending  LDL pending  HgbA1c pending  SCDs for VTE prophylaxis  Diet clear liquid Room service appropriate?: Yes; Fluid consistency:: Thin   aspirin 81 mg orally every day and clopidogrel 75 mg orally every day prior to admission, now on aspirin 81 mg orally every day  Patient counseled to be compliant with her antithrombotic medications  Ongoing aggressive stroke risk factor management  Therapy recommendations:  pending  Disposition:  pending  Right ICA aneurysm  In 2008 it showed 2mm  Will need MRA repeat  Per Dr. Estanislado Pandy  Hypertension  Home meds:   lisinopril Currently on lisinopril  Unstable  Add norvasc  BP goal < 160  Patient counseled to be compliant with her blood pressure medications  Hyperlipidemia  Home meds:  lipitor 80   Currently on lipitor 80  LDL pending, goal < 70  Continue statin at discharge  Other Stroke Risk  Factors  Advanced age  Hx stroke/TIA  Other Active Problems  Hyponatremia  Leukocytosis - check UA and CXR  Other Pertinent History    Hospital day # 1  This patient is critically ill due to traumatic hemorrhage in elderly, hx of MCA stenting on antiplatelets and at significant risk of neurological worsening, death form hematoma enlargement, seizure, recurrent stroke. This patient's care requires constant monitoring of vital signs, hemodynamics, respiratory and cardiac monitoring, review of multiple databases, neurological assessment, discussion with family, other specialists and medical decision making of high complexity. I spent 40 minutes of neurocritical care time in the care of this patient.   Rosalin Hawking, MD PhD Stroke Neurology 02/10/2015 3:23 PM    To contact Stroke Continuity provider, please refer to [email or web address] After hours, contact General Neurology

## 2015-02-10 NOTE — Evaluation (Signed)
Speech Language Pathology Evaluation Patient Details Name: Jodi Colon MRN: [id number] DOB: 02-26-1930 Today's Date: 02/10/2015 Time: 4098-1191 SLP Time Calculation (min) (ACUTE ONLY): 31 min  Problem List:  Patient Active Problem List   Diagnosis Date Noted  . Subarachnoid hemorrhage 02/09/2015  . Hemorrhoids that prolapse with straining, but retract spontaneously 01/24/2013  . Scalp lesion 11/27/2012  . Rectal prolapse 11/27/2012  . Abscess, gluteal, right 10/10/2011  . Degenerative joint disease   . Cerebrovascular disease   . MRSA (methicillin resistant Staphylococcus aureus)   . Hyperlipidemia LDL goal < 100 04/02/2011   Past Medical History:  Past Medical History  Diagnosis Date  . Degenerative joint disease     [facility]  . Hypertension   . Hyperlipidemia   . MRSA (methicillin resistant Staphylococcus aureus) 2008    perineal abscess  . Brain aneurysm     small, followed with regular angiograms, Dr. Estanislado Pandy  . Spinal stenosis   . DCIS (ductal carcinoma in situ) of breast 2007    Right, s/p lumpectomy/xrt   . Cerebrovascular disease 2008    s/p PTCA/stent of MCA, Deveshwar  . History of MRSA infection    Past Surgical History:  Past Surgical History  Procedure Laterality Date  . Cholecystectomy    . Colonoscopy  2013  . Hemorrhoid surgery  02/01/13   HPI:  81 y.o. female with PMH brain aneurysm, CVA, admitted after fall sustaining ICH/SAH.     Assessment / Plan / Recommendation Clinical Impression  Pt presents with mild, acute changes in cognition, mild deficits speech intelligibility s/p fall (trauma to right face).  Presents with deficits in sequencing/organizing information, selective attention.  Short-term memory performance per MOCA was [facility].  Insight into circumstances not fully functional.  Recommend short-term 24 hour supervision initially; f/u SLP to determine needs s/p D/C.     SLP Assessment  Patient needs continued Speech  [place] Path[facility] Services    Follow Up Recommendations    home with 24 hr supervision initially   Frequency and Duration min 2x/week  1 week   Pertinent Vitals/Pain     SLP Goals     SLP Evaluation Prior Functioning  Cognitive/Linguistic Baseline: Baseline deficits Baseline deficit details: mild memory Type of Home: House  Lives With:  (grandson) Available Help at Discharge: Family   Cognition  Overall Cognitive Status: Impaired/Different from baseline Arousal/Alertness: Awake/alert Orientation Level: Oriented X4 Attention: Selective Selective Attention: Impaired Selective Attention Impairment: Verbal basic Memory: Appears intact Awareness: Impaired Awareness Impairment: Intellectual impairment Problem Solving: Impaired Problem Solving Impairment: Verbal basic Executive Function: Sequencing Sequencing: Impaired Sequencing Impairment: Verbal basic Safety/Judgment: Impaired    Comprehension  Auditory Comprehension Overall Auditory Comprehension: Appears within functional limits for tasks assessed Visual Recognition/Discrimination Discrimination: Within Function Limits Reading Comprehension Reading Status: Not tested    Expression Expression Primary Mode of Expression: Verbal Verbal Expression Overall Verbal Expression: Appears within functional limits for tasks assessed Written Expression Written Expression: Not tested   Oral / Motor Oral Motor/Sensory Function Overall Oral Motor/Sensory Function: Impaired (decreased retraction right side mouth - ? due to fall, edema) Motor Speech Overall Motor Speech: Impaired Respiration: Within functional limits Phonation: Normal Resonance: Within functional limits Intelligibility: Intelligibility reduced Word: 75-100% accurate Phrase: 75-100% accurate Sentence: 75-100% accurate Conversation: 75-100% accurate Motor Planning: Witnin functional limits   GO     Juan Quam Laurice 02/10/2015, 11:28 AM

## 2015-02-10 NOTE — Care Management Note (Signed)
Case Management Note  Patient Details  Name: PALMYRA ROGACKI MRN: [id number] Date of Birth: August 21, 1929  Subjective/Objective:    Pt admitted on 02/09/15 with ICH/SAH.  PTA, pt resided at home with grandson.                  Action/Plan: PT recommending SNF for rehab at dc.  Will consult CSW to facilitate poss dc to SNF when medically stable for dc.    Expected Discharge Date:                  Expected Discharge Plan:  Skilled Nursing Facility  In-House Referral:  Clinical Social Work  Discharge planning Services  CM Consult  Post Acute Care Choice:    Choice offered to:     DME Arranged:    DME Agency:     HH Arranged:    [place] Agency:     Status of Service:  In process, will continue to follow  Medicare Important Message Given:    Date Medicare IM Given:    Medicare IM give by:    Date Additional Medicare IM Given:    Additional Medicare Important Message give by:     If discussed at [place] of Stay Meetings, dates discussed:    Additional Comments:  Reinaldo Raddle, RN, BSN  Trauma/Neuro ICU Case Manager [phone]

## 2015-02-10 NOTE — Progress Notes (Signed)
Subjective: Patient reports "I feel ok"  Objective: Vital signs in last 24 hours: Temp:  [97.4 F (36.3 C)-98.4 F (36.9 C)] 98.4 F (36.9 C) (09/12 0725) Pulse Rate:  [70-93] 71 (09/12 0600) Resp:  [9-27] 15 (09/12 0600) BP: (98-202)/(37-117) 98/37 mmHg (09/12 0600) SpO2:  [93 %-100 %] 97 % (09/12 0600) Weight:  [51.9 kg (114 lb 6.7 oz)] 51.9 kg (114 lb 6.7 oz) (09/11 1856)  Intake/Output from previous day: 09/11 0701 - 09/12 0700 In: 545 [I.V.:545] Out: -  Intake/Output this shift:    Awakens to voice. PEARL. MAEW. Oriented x3 Reports "only a little" headache.  Repeat CT with stable to decreased SDH/SAH per radiologist's review.   Lab Results:  Recent Labs  02/09/15 1542  WBC 12.7*  HGB 12.2  HCT 36.3  PLT 203   BMET  Recent Labs  02/09/15 1542  NA 123*  K 3.9  CL 88*  CO2 26  GLUCOSE 123*  BUN 14  CREATININE 0.63  CALCIUM 9.0    Studies/Results: Ct Head Wo Contrast  02/10/2015   CLINICAL DATA:  Follow-up examination for acute subarachnoid hemorrhage.  EXAM: CT HEAD WITHOUT CONTRAST  TECHNIQUE: Contiguous axial images were obtained from the base of the skull through the vertex without intravenous contrast.  COMPARISON:  Prior CT from 02/09/2015.  FINDINGS: Again seen overlying the right cerebral convexity is a small amount of acute subarachnoid and/ or subdural blood without significant mass effect. Overall, this is felt to be stable the slightly decreased. No new intracranial hemorrhage. Small volume hemorrhage within the occipital horns of both lateral ventricle, stable from previous, likely related to redistribution. No acute infarct. Stent within the right M1 segment again noted.  Atrophy with chronic microvascular ischemic disease noted. No hydrocephalus. No midline shift.  Paranasal sinuses and mastoid air cells are clear.  Calvarium intact.  No acute abnormality about the orbits. Small right-sided scalp contusion noted.  IMPRESSION: 1. Stable to slightly  decreased small volume acute subarachnoid and/or subdural hemorrhage overlying the right cerebral convexity. No significant mass effect. No new intracranial process. 2. Small volume hemorrhage within the occipital horns of both lateral ventricles, likely related to redistribution, similar to prior. No hydrocephalus.   Electronically Signed   By: Jeannine Boga M.D.   On: 02/10/2015 06:23   Ct Head Wo Contrast  02/09/2015   CLINICAL DATA:  Patient found at home after an apparent fall. Reported laceration over the RIGHT eye. Slurred speech and RIGHT facial droop reported.  EXAM: CT HEAD WITHOUT CONTRAST  CT CERVICAL SPINE WITHOUT CONTRAST  TECHNIQUE: Multidetector CT imaging of the head and cervical spine was performed following the standard protocol without intravenous contrast. Multiplanar CT image reconstructions of the cervical spine were also generated.  COMPARISON:  CT head 09/20/2013.  Cerebral angiogram 07/17/2010.  FINDINGS: CT HEAD FINDINGS  Over the RIGHT frontal convexity, there are suspected areas of subarachnoid and/or subdural blood, relatively small, without significant mass effect on the underlying brain. These were not clearly present on most recent prior, and could suggest sequelae of mild closed head injury in this patient on anticoagulation (Plavix). No significant parenchymal hemorrhage is evident.  Mild atrophy consistent with patient's stated age. Chronic microvascular ischemic change. There is a RIGHT MCA stent unchanged from priors.  No features suggestive of acute infarction. No parenchymal hemorrhage. No hydrocephalus or intra-axial mass lesion.  Calvarium is intact. Vascular calcification is noted. No sinus or mastoid air fluid level. No significant scalp or periorbital soft  tissue swelling is evident.  CT CERVICAL SPINE FINDINGS  There is no visible cervical spine fracture, traumatic subluxation, prevertebral soft tissue swelling, or intraspinal hematoma. Mild straightening of  the normal cervical lordosis. Mild disc space narrowing at C5-6 and C6-7. Mild facet arthropathy. Carotid atherosclerosis. No lung apex lesion. No neck masses.  IMPRESSION: Suspected areas of relatively minor RIGHT frontal subarachnoid and/or subdural posttraumatic hemorrhage, without significant mass effect on the underlying brain. These are felt to be posttraumatic reflecting closed head injury. No suggestion of aneurysmal SAH. No associated skull fracture or parenchymal hemorrhage.  No acute stroke is evident.  Cervical spondylosis without fracture or traumatic subluxation.  Findings discussed with ordering provider.   Electronically Signed   By: Staci Righter M.D.   On: 02/09/2015 15:18   Ct Cervical Spine Wo Contrast  02/09/2015   CLINICAL DATA:  Patient found at home after an apparent fall. Reported laceration over the RIGHT eye. Slurred speech and RIGHT facial droop reported.  EXAM: CT HEAD WITHOUT CONTRAST  CT CERVICAL SPINE WITHOUT CONTRAST  TECHNIQUE: Multidetector CT imaging of the head and cervical spine was performed following the standard protocol without intravenous contrast. Multiplanar CT image reconstructions of the cervical spine were also generated.  COMPARISON:  CT head 09/20/2013.  Cerebral angiogram 07/17/2010.  FINDINGS: CT HEAD FINDINGS  Over the RIGHT frontal convexity, there are suspected areas of subarachnoid and/or subdural blood, relatively small, without significant mass effect on the underlying brain. These were not clearly present on most recent prior, and could suggest sequelae of mild closed head injury in this patient on anticoagulation (Plavix). No significant parenchymal hemorrhage is evident.  Mild atrophy consistent with patient's stated age. Chronic microvascular ischemic change. There is a RIGHT MCA stent unchanged from priors.  No features suggestive of acute infarction. No parenchymal hemorrhage. No hydrocephalus or intra-axial mass lesion.  Calvarium is intact.  Vascular calcification is noted. No sinus or mastoid air fluid level. No significant scalp or periorbital soft tissue swelling is evident.  CT CERVICAL SPINE FINDINGS  There is no visible cervical spine fracture, traumatic subluxation, prevertebral soft tissue swelling, or intraspinal hematoma. Mild straightening of the normal cervical lordosis. Mild disc space narrowing at C5-6 and C6-7. Mild facet arthropathy. Carotid atherosclerosis. No lung apex lesion. No neck masses.  IMPRESSION: Suspected areas of relatively minor RIGHT frontal subarachnoid and/or subdural posttraumatic hemorrhage, without significant mass effect on the underlying brain. These are felt to be posttraumatic reflecting closed head injury. No suggestion of aneurysmal SAH. No associated skull fracture or parenchymal hemorrhage.  No acute stroke is evident.  Cervical spondylosis without fracture or traumatic subluxation.  Findings discussed with ordering provider.   Electronically Signed   By: Staci Righter M.D.   On: 02/09/2015 15:18   Dg Hip Unilat With Pelvis 2-3 Views Right  02/09/2015   CLINICAL DATA:  Recent fall in bathroom with right hip pain, initial encounter  EXAM: DG HIP (WITH OR WITHOUT PELVIS) 2-3V RIGHT  COMPARISON:  None.  FINDINGS: Pelvic ring is intact. No acute fracture or dislocation is noted. No soft tissue abnormality is seen. Degenerative changes of the right hip joint are noted.  IMPRESSION: Degenerative change without acute abnormality.   Electronically Signed   By: Inez Catalina M.D.   On: 02/09/2015 14:46    Assessment/Plan:   LOS: 1 day  Continue support. DrStern will review CT as well. Will notify Dr.Deveshwar of her admission and include stroke team (MCA stent needing Plavix, but recent  head trauma). Consulted Dr. Erlinda Hong - recommends holding Plavix for now and continuing ASA 81mg  since stent is older than 23yr and today's CT is stable.    Verdis Prime 02/10/2015, 7:34 AM

## 2015-02-10 NOTE — Progress Notes (Addendum)
Patient ID: Jodi Colon, female   DOB: 08/24/1929, 81 y.o.   MRN: [id number]   INR  Patient seen last week  in the clinic for routine follow up. Patient is S/P RT MCA angioplasty with stent placement  A fewe years ago and was lost to follow up. Also known to have Lt MCA  stenosis as per previous studies.  Patient admitted yesterday with a small SAH ,IVH and SDH suspected to be related to a fall. Presently patient asleep,but noted to be stable as per nurses and NES notes.  VS stable  Plan  1As per NES . 2.Leave on aspirin 325mg  per day  3.D/C plavix for now. 4.Will obtain MRI Brain and MRA Brain prior to D/C. 5.Will follow with you.  Spoke  to patients daughter .

## 2015-02-11 ENCOUNTER — Inpatient Hospital Stay (HOSPITAL_COMMUNITY): Payer: Medicare Other

## 2015-02-11 DIAGNOSIS — I6789 Other cerebrovascular disease: Secondary | ICD-10-CM

## 2015-02-11 DIAGNOSIS — E785 Hyperlipidemia, unspecified: Secondary | ICD-10-CM

## 2015-02-11 LAB — HEMOGLOBIN A1C
Hgb A1c MFr Bld: 6 % — ABNORMAL HIGH (ref 4.8–5.6)
Mean Plasma Glucose: 126 mg/dL

## 2015-02-11 MED ORDER — ASPIRIN 325 MG PO TABS
325.0000 mg | ORAL_TABLET | Freq: Every day | ORAL | Status: DC
  Administered 2015-02-11 – 2015-02-12 (×2): 325 mg via ORAL
  Filled 2015-02-11 (×2): qty 1

## 2015-02-11 MED ORDER — VENLAFAXINE HCL ER 150 MG PO CP24
150.0000 mg | ORAL_CAPSULE | Freq: Every day | ORAL | Status: DC
  Administered 2015-02-11 – 2015-02-12 (×2): 150 mg via ORAL
  Filled 2015-02-11: qty 2
  Filled 2015-02-11 (×2): qty 1
  Filled 2015-02-11: qty 2

## 2015-02-11 MED ORDER — DESMOPRESSIN ACETATE 0.2 MG PO TABS
0.2000 mg | ORAL_TABLET | Freq: Every day | ORAL | Status: DC
  Administered 2015-02-12: 0.2 mg via ORAL
  Filled 2015-02-11: qty 1

## 2015-02-11 NOTE — Progress Notes (Signed)
*  PRELIMINARY RESULTS* Echocardiogram 2D Echocardiogram has been performed.  Jodi Colon 02/11/2015, 10:08 AM

## 2015-02-11 NOTE — Progress Notes (Signed)
Patient ID: Jodi Colon, female   DOB: 1930-04-09, 81 y.o.   MRN: [id number] INR  H/As significantly improved.No N/V or visual or motor or sensory symptoms. Able to handle solid food. MRI /MRA brain reviewed. No evidence of acute ischemia or hydrocephalus. Minilam IVH and etraaxial blood products. MRA patency of both MCAs,with moderately severe stenosis of LT MCA M1.  Discussed with patient. Plan . 1.Stay on aspirin 325 mg per day. 2.Hold plavix for now . 3.Will see patient in clinic in  2 to 4 weeks post D/C. Dr Earl Gala MD

## 2015-02-11 NOTE — Care Management Note (Signed)
Case Management Note  Patient Details  Name: SUDIKSHA VICTOR MRN: [id number] Date of Birth: January 30, 1930  Subjective/Objective:    Pt reassessed by PT today; recommendation is now HHPT with 24hr supervision initially.                 Action/Plan: Met with pt and daughter, at bedside, to discuss dc plans.  Pt states she wants to go home; she states grandson lives with her, and is there only at night, as he works.  Daughter states she and her sisters are planning to stay with pt to provide 24hr care initially at dc.  Pt has been at [facility] before, but prefers home dc with [place] services.  Pt/daughter given [facility] agency list for [facility], as pt lives in [place].  Will need order for [facility] services prior to dc; Case Manager will follow.    Expected Discharge Date:                  Expected Discharge Plan:  [place]  In-House Referral:  Clinical Social Work  Discharge planning Services  CM Consult  Post Acute Care Choice:    Choice offered to:     DME Arranged:    DME Agency:     HH Arranged:    [place] Agency:     Status of Service:  In process, will continue to follow  Medicare Important Message Given:    Date Medicare IM Given:    Medicare IM give by:    Date Additional Medicare IM Given:    Additional Medicare Important Message give by:     If discussed at [place] of Stay Meetings, dates discussed:    Additional Comments:  Reinaldo Raddle, RN, BSN  Trauma/Neuro ICU Case Manager [phone]

## 2015-02-11 NOTE — Progress Notes (Signed)
Physical Therapy Treatment Patient Details Name: Jodi Colon MRN: [id number] DOB: June 25, 1929 Today's Date: 02/11/2015    History of Present Illness pt is an 81 y/o female admitted after a fall in her bedroom, staying on the floor for an indeterminate amount of time, suffering a small ICH, SAH    PT Comments    Progressing steadily.  Gait stability improved, but still unsteady especially with scanning.  Needs more practice with cane as she tends to carry it.  Pt states her children are thinking of taking turns watching out for her.   Follow Up Recommendations  Home health PT;Supervision/Assistance - 24 hour     Equipment Recommendations  None recommended by PT    Recommendations for Other Services       Precautions / Restrictions Precautions Precautions: Fall    Mobility  Bed Mobility Overal bed mobility: Needs Assistance Bed Mobility: Supine to Sit           General bed mobility comments: struggle to get to EOB even with rail, but no assist needed  Transfers Overall transfer level: Needs assistance   Transfers: Sit to/from Stand Sit to Stand: Min assist;Min guard         General transfer comment: needed some assist to come forward lower surfaces, higher surfaces she rocked several times to attain stanc  Ambulation/Gait Ambulation/Gait assistance: Min guard Ambulation Distance (Feet): 160 Feet (plus 15 feet to bathroom) Assistive device: Straight cane Gait Pattern/deviations: Step-through pattern;Drifts right/left (mildly unsteady) Gait velocity: slower   General Gait Details: noted more unsteadiness early on while managing obstacles in the room, but smoother as she progressed.  Carried the cane much too often, but unsteady enough to need a cane.  More instability with scanning   Stairs            Wheelchair Mobility    Modified Rankin (Stroke Patients Only)       Balance Overall balance assessment: Needs assistance   Sitting balance-Leahy  Scale: Good Sitting balance - Comments: able to w/shift--reach outside BOS   Standing balance support: No upper extremity supported Standing balance-Leahy Scale: Fair Standing balance comment: standing unassisted, carrying cane holding conversation               High Level Balance Comments: Scanning during ambulation produced some instability    Cognition Arousal/Alertness: Awake/alert Behavior During Therapy: WFL for tasks assessed/performed Overall Cognitive Status: Within Functional Limits for tasks assessed     Current Attention Level: Selective   Following Commands: Follows one step commands consistently Safety/Judgement: Decreased awareness of deficits          Exercises      General Comments        Pertinent Vitals/Pain Pain Assessment: No/denies pain    Home Living                      Prior Function            PT Goals (current goals can now be found in the care plan section) Acute Rehab PT Goals Patient Stated Goal: eventually be back independent PT Goal Formulation: With patient Time For Goal Achievement: 02/17/15 Potential to Achieve Goals: Good Progress towards PT goals: Progressing toward goals    Frequency  Min 3X/week    PT Plan Discharge plan needs to be updated    Co-evaluation             End of Session   Activity Tolerance: Patient tolerated treatment  well;Patient limited by fatigue Patient left: with call bell/phone within reach;in chair     Time: 7290-2111 PT Time Calculation (min) (ACUTE ONLY): 24 min  Charges:  $Gait Training: 8-22 mins $Therapeutic Activity: 8-22 mins                    G Codes:      Klynn Linnemann, Tessie Fass 02/11/2015, 12:41 PM 02/11/2015  Donnella Sham, [place] [phone]  (pager)

## 2015-02-11 NOTE — Progress Notes (Signed)
Called to room by bedside nurse to further discuss dc planning.  Daughter states that she will not be able to provide 24hr care for pt at dc, and they are now considering short-term SNF.  Consulted CSW; pt in process of being transferred to [facility] at this time.  55M CSW will hand off to [place] for follow up.    Reinaldo Raddle, RN, BSN  Trauma/Neuro ICU Case Manager [phone]

## 2015-02-11 NOTE — Clinical Social Work Note (Signed)
Clinical Social Worker received referral for possible ST-SNF placement.  Chart reviewed.  PT/OT recommending home with home health and 24 hour support vs. SNF placement.  Spoke with RN Case Manager who spoke with patient and patient daughter who are planning to return home with home health and 24 hour support.    CSW signing off - please re consult if social work needs arise.  Barbette Or, [place]

## 2015-02-11 NOTE — Progress Notes (Signed)
Pt seen and examined. No issues overnight. Minimal HA this am, able to ambulate. Tolerating diet.  EXAM: Temp:  [97.7 F (36.5 C)-98.3 F (36.8 C)] 97.9 F (36.6 C) (09/13 0742) Pulse Rate:  [70-104] 71 (09/13 0600) Resp:  [13-21] 21 (09/13 0800) BP: (91-189)/(34-116) 151/64 mmHg (09/13 0800) SpO2:  [95 %-100 %] 100 % (09/13 0800) Intake/Output      09/12 0701 - 09/13 0700 09/13 0701 - 09/14 0700   I.V. (mL/kg) 300 (5.8)    Total Intake(mL/kg) 300 (5.8)    Net +300          Urine Occurrence  1 x    Awake, alert CN grossly intact MAE well, good strength  LABS: Lab Results  Component Value Date   CREATININE 0.66 02/10/2015   BUN 7 02/10/2015   NA 128* 02/10/2015   K 4.1 02/10/2015   CL 96* 02/10/2015   CO2 22 02/10/2015   Lab Results  Component Value Date   WBC 8.4 02/10/2015   HGB 12.9 02/10/2015   HCT 38.1 02/10/2015   MCV 88.0 02/10/2015   PLT 263 02/10/2015    IMAGING: MRI/MRA reviewed. Demonstrates stable ICH. No HCP. There is mild-moderate MCA/PCA atherosclerotic disease.  IMPRESSION: - 81 y.o. female s/p fall with ICH, neurologically intact with stable imaging. - S/P RMCA stent for atherosclerotic disease  PLAN: - Cont ASA 325mg , Plavix d/c'ed - Transfer to floor today - May be able to go home tomorrow if family support available.

## 2015-02-11 NOTE — Progress Notes (Signed)
STROKE TEAM PROGRESS NOTE   SUBJECTIVE (INTERVAL HISTORY) No family is at the bedside.  Overall she feels her condition is gradually improving. More awake alert, no HA. MRI and MRA done. Right M1 mild stenosis, stent patent.  OBJECTIVE  No results for input(s): GLUCAP in the last 168 hours.  Recent Labs Lab 02/09/15 1542 02/10/15 1635  NA 123* 128*  K 3.9 4.1  CL 88* 96*  CO2 26 22  GLUCOSE 123* 115*  BUN 14 7  CREATININE 0.63 0.66  CALCIUM 9.0 9.2    Recent Labs Lab 02/09/15 1542  AST 42*  ALT 25  ALKPHOS 101  BILITOT 1.0  PROT 7.0  ALBUMIN 4.4    Recent Labs Lab 02/09/15 1542 02/10/15 1635  WBC 12.7* 8.4  HGB 12.2 12.9  HCT 36.3 38.1  MCV 88.2 88.0  PLT 203 263    Recent Labs Lab 02/09/15 1542  CKTOTAL 571*  TROPONINI <0.03   No results for input(s): LABPROT, INR in the last 72 hours. No results for input(s): COLORURINE, LABSPEC, [place], GLUCOSEU, HGBUR, BILIRUBINUR, KETONESUR, PROTEINUR, UROBILINOGEN, NITRITE, LEUKOCYTESUR in the last 72 hours.  Invalid input(s): APPERANCEUR     Component Value Date/Time   CHOL 180 02/10/2015 1635   TRIG 63 02/10/2015 1635   HDL 81 02/10/2015 1635   CHOLHDL 2.2 02/10/2015 1635   VLDL 13 02/10/2015 1635   LDLCALC 86 02/10/2015 1635   Lab Results  Component Value Date   HGBA1C 6.0* 02/10/2015   No results found for: LABOPIA, COCAINSCRNUR, LABBENZ, AMPHETMU, THCU, LABBARB  No results for input(s): ETH in the last 168 hours.  I have personally reviewed the radiological images below and agree with the radiology interpretations.  Ct Head Wo Contrast  02/10/2015   IMPRESSION: 1. Stable to slightly decreased small volume acute subarachnoid and/or subdural hemorrhage overlying the right cerebral convexity. No significant mass effect. No new intracranial process. 2. Small volume hemorrhage within the occipital horns of both lateral ventricles, likely related to redistribution, similar to prior. No hydrocephalus.    02/09/2015    IMPRESSION: Suspected areas of relatively minor RIGHT frontal subarachnoid and/or subdural posttraumatic hemorrhage, without significant mass effect on the underlying brain. These are felt to be posttraumatic reflecting closed head injury. No suggestion of aneurysmal SAH. No associated skull fracture or parenchymal hemorrhage.  No acute stroke is evident.  Cervical spondylosis without fracture or traumatic subluxation.    Ct Cervical Spine Wo Contrast  02/09/2015  IMPRESSION: Suspected areas of relatively minor RIGHT frontal subarachnoid and/or subdural posttraumatic hemorrhage, without significant mass effect on the underlying brain. These are felt to be posttraumatic reflecting closed head injury. No suggestion of aneurysmal SAH. No associated skull fracture or parenchymal hemorrhage.  No acute stroke is evident.  Cervical spondylosis without fracture or traumatic subluxation.  Findings discussed with ordering provider.      Ir Radiologist Eval & Mgmt  02/10/2015    ASSESSMENT AND PLAN: The previous patient's her previous neuroimaging follow-up MRI scans MRA scans and a catheter angiograms were reviewed  Occasions the most recent neuroimaging follow-up study was a CT of the brain in 2015.  However in terms of evaluation of her vasculature her most recent recent angiogram was the if ever 2012  However given the patient's these symptoms and history as above a follow-up MRI of the brain and MRA of the brain is indicated to evaluate for interval changes in the brain parenchyma source ischemic stroke, and also to evaluate on the intracranial  vasculature especially the superior posterior region aneurysm on the right at without MRA examination. Questions are answered to their satisfaction.  The MRI MRA of the brain will be scheduled scheduling the earliest possible  Should these he denied by her insurance company, the patient was counseled that she may the [facility] catheter angiogram. Patient and the  daughter leave with good understanding and agreement with above management plan.   Electronically Signed   By: Luanne Bras M.D.   On: 02/07/2015 15:56   Mri and Mra Head Wo Contrast  02/11/2015   IMPRESSION: MRI HEAD: Small amount of extra-axial blood products and intraventricular hemorrhage better seen on prior CT. No obstructive hydrocephalus. No acute ischemia.  Chronic changes including mild to moderate chronic small vessel ischemic disease, old bilateral basal ganglia lacunar infarcts.  MRA HEAD:  No acute large vessel occlusion.  Status post RIGHT M1 stenting, stent is patent though, mild stenosis versus artifact through the stent.  Moderate LEFT M1, RIGHT proximal P2, LEFT P1 segment stenosis. Moderate general luminal regularity of the middle cerebral arteries and posterior cerebral arteries compatible with atherosclerosis.    Carotid Doppler  pending  2D Echocardiogram  - Left ventricle: The cavity size was normal. Wall thickness was increased in a pattern of mild LVH. Systolic function was vigorous. The estimated ejection fraction was in the range of 65% to 70%. Wall motion was normal; there were no regional wall motion abnormalities. Doppler parameters are consistent with abnormal left ventricular relaxation (grade 1 diastolic dysfunction). The E/e&' ratio is between 8-15, suggesting indeterminate LV filling pressure. - Aortic valve: Sclerosis without stenosis. There was no significant regurgitation. - Left atrium: The atrium was normal in size.  Impressions: - LVEF 65-70%, mild LVH, normal wall motion, aortic valve sclerosis, diastolic dysfunction, indeterminate LV filling pressure, normal LA size.   PHYSICAL EXAM  Temp:  [97.7 F (36.5 C)-98.3 F (36.8 C)] 98.3 F (36.8 C) (09/13 1123) Pulse Rate:  [70-104] 84 (09/13 0900) Resp:  [13-21] 18 (09/13 1200) BP: (91-162)/(34-64) 140/37 mmHg (09/13 0900) SpO2:  [95 %-100 %] 100 % (09/13  1200)  General - Well nourished, well developed, in no apparent distress.  Ophthalmologic - fundi not visualized due to noncooperation.  Cardiovascular - Regular rate and rhythm with no murmur.  Neck - supple, no carotid bruits  Mental Status -  Level of arousal and orientation to time, place, and person were intact. Language including expression, naming, repetition, comprehension was assessed and found intact. Fund of Knowledge was assessed and was impaired.  Cranial Nerves II - XII - II - Visual field intact OU. III, IV, VI - Extraocular movements intact. V - Facial sensation intact bilaterally. VII - Facial movement showed mild right facial droop. VIII - Hearing & vestibular intact bilaterally. X - Palate elevates symmetrically. XI - Chin turning & shoulder shrug intact bilaterally. XII - Tongue protrusion intact.  Motor Strength - The patient's strength was normal in all extremities and pronator drift was absent except right LE proximal 4/5 due to arthritic pain as per pt.  Bulk was normal and fasciculations were absent.   Motor Tone - Muscle tone was assessed at the neck and appendages and was normal.  Reflexes - The patient's reflexes were symmetrical in all extremities and she had no pathological reflexes.  Sensory - Light touch, temperature/pinprick were assessed and were symmetrical.    Coordination - The patient had normal movements in the hands and feet with no ataxia or dysmetria.  Tremor  was absent.  Gait and Station - deferred due to safety concerns.   ASSESSMENT/PLAN Ms. Jodi Colon is a 81 y.o. female with history of HTN, HLD, CVA, s/p right MCA stent who was admitted for right small SAH and trace IVH s/p fall. Symptoms stable. Neurology consulted for antiplatelet use in the setting of traumatic hemorrhage.     Traumatic ICH:  Left frontal small SAH and bilateral trace IVH\  Repeat CT stable SAH and IVH  Pt was on home ASA and plavix, now on ASA  Agree  with hold off plavix  Continue ASA (pt hemorrhage is stable on CT while on ASA)  Management TBI as per NSG  Right MCA stenting  Done in 2008 by Dr. Estanislado Pandy  Lost follow up in 2012  On home ASA and plavix  Agree to hold off plavix in light of traumatic hemorrhage.  MRI  Small left frontal SAH  MRA  Right M1 mild stenosis and stent is patent  Carotid Doppler  pending  2D Echo  unremarkable  LDL 86  HgbA1c 6.0  SCDs for VTE prophylaxis  Diet regular Room service appropriate?: Yes; Fluid consistency:: Thin   aspirin 81 mg orally every day and clopidogrel 75 mg orally every day prior to admission, now on aspirin 81 mg orally every day  Patient counseled to be compliant with her antithrombotic medications  Ongoing aggressive stroke risk factor management  Therapy recommendations:  pending  Disposition:  Pending  Follow up with Dr. Estanislado Pandy in 2-4 weeks post dc  Hypertension  Home meds:   lisinopril Currently on lisinopril  Unstable  Add norvasc  BP goal < 160  Patient counseled to be compliant with her blood pressure medications  Hyperlipidemia  Home meds:  lipitor 80   Currently on lipitor 80  LDL 84, goal < 70  Continue statin at discharge  Other Stroke Risk Factors  Advanced age  Hx stroke/TIA  Other Active Problems  Hyponatremia  Leukocytosis - check UA and CXR  Other Pertinent History    Hospital day # 2  Neurology will sign off. Please call with questions. Pt will follow up with Dr. Est[facility]islado Pandy at [facility] in about 2-4 weeks. Thanks for the consult.   Rosalin Hawking, MD PhD Stroke Neurology 02/11/2015 3:16 PM    To contact Stroke Continuity provider, please refer to [email or web address] After hours, contact General Neurology

## 2015-02-12 ENCOUNTER — Encounter
Admission: RE | Admit: 2015-02-12 | Discharge: 2015-02-12 | Disposition: A | Discharge status: Home / Self Care | Payer: Medicare Other | Source: Ambulatory Visit | Attending: Internal Medicine | Admitting: Internal Medicine

## 2015-02-12 ENCOUNTER — Encounter (HOSPITAL_COMMUNITY): Payer: Medicare Other

## 2015-02-12 DIAGNOSIS — E871 Hypo-osmolality and hyponatremia: Secondary | ICD-10-CM | POA: Insufficient documentation

## 2015-02-12 LAB — URINALYSIS W MICROSCOPIC (NOT AT ARMC)
Bilirubin Urine: NEGATIVE
Glucose, UA: NEGATIVE mg/dL
Hgb urine dipstick: NEGATIVE
Ketones, ur: NEGATIVE mg/dL
Nitrite: NEGATIVE
Protein, ur: NEGATIVE mg/dL
Specific Gravity, Urine: 1.017 (ref 1.005–1.030)
Urobilinogen, UA: 0.2 mg/dL (ref 0.0–1.0)
pH: 6.5 (ref 5.0–8.0)

## 2015-02-12 MED ORDER — AMLODIPINE BESYLATE 10 MG PO TABS: 10.0000 mg | ORAL_TABLET | Freq: Every day | ORAL | Status: DC

## 2015-02-12 MED ORDER — ASPIRIN 325 MG PO TABS: 325.0000 mg | ORAL_TABLET | Freq: Every day | ORAL | Status: DC

## 2015-02-12 NOTE — Progress Notes (Signed)
Patient in bed with family at bedside.  Patient having leg pain, tinzanidine ordered from the pharmacy, advised social worker family was here.

## 2015-02-12 NOTE — Discharge Summary (Signed)
  Physician Discharge Summary  Patient ID: Jodi Colon MRN: [id number] DOB/AGE: 01-27-1930 81 y.o.  Admit date: 02/09/2015 Discharge date: 02/12/2015  Admission Diagnoses: Traumatic subarachnoid hemorrhage  Discharge Diagnoses: Same Active Problems:   Subarachnoid hemorrhage   Discharged Condition: Stable  Hospital Course:  Mrs. Jodi Colon is a 81 y.o. female admitted after a fall and CT scan demonstrated intracranial hemorrhage.  She has a history of aspirin and Plavix use after a previous  Right middle cerebral artery stent placement for intracranial atherosclerotic disease.  She was neurologically intact, observed in the intensive care unit, and MRI was requested by Dr. Patrecia Pour. This demonstrated stability of the intracranial hemorrhage, patency of the middle cerebral artery stent, and some intracranial atherosclerotic disease elsewhere.   She was also evaluated by stroke service.  She remained neurologically well, ambulating with minimal assistance, tolerating diet, with minimal pain.   Discharge Exam: Blood pressure 124/41, pulse 70, temperature 97.8 F (36.6 C), temperature source Oral, resp. rate 16, height 4\' 9"  (1.448 m), weight 51.9 kg (114 lb 6.7 oz), SpO2 100 %. Awake, alert, oriented Speech fluent, appropriate CN grossly intact 5/5 BUE/BLE Wound c/d/i   Disposition: 02-Short Term Hospital     Medication List    STOP taking these medications        clopidogrel 75 MG tablet  Commonly known as:  PLAVIX     traZODone 100 MG tablet  Commonly known as:  DESYREL      TAKE these medications        amLODipine 10 MG tablet  Commonly known as:  NORVASC  Take 1 tablet (10 mg total) by mouth daily.     aspirin 325 MG tablet  Take 1 tablet (325 mg total) by mouth daily.     atorvastatin 80 MG tablet  Commonly known as:  LIPITOR  Take 80 mg by mouth daily.     desmopressin 0.2 MG tablet  Commonly known as:  DDAVP  Take 0.2 mg by mouth daily.     HYDROcodone-acetaminophen 7.5-325 MG per tablet  Commonly known as:  NORCO  Take 1 tablet by mouth 3 (three) times daily as needed for moderate pain.     levothyroxine 50 MCG tablet  Commonly known as:  SYNTHROID, LEVOTHROID  Take 25-50 mcg by mouth See admin instructions. Take one tablet by mouth daily except on sundays take one and one half by mouth. Per Med record     lisinopril 40 MG tablet  Commonly known as:  PRINIVIL,ZESTRIL  Take 1 tablet (40 mg total) by mouth daily.     multivitamin tablet  Take 1 tablet by mouth daily.     PROTONIX 40 MG tablet  Generic drug:  pantoprazole  Take 40 mg by mouth 2 (two) times daily.     topiramate 25 MG tablet  Commonly known as:  TOPAMAX  Take 25 mg by mouth daily.     Venlafaxine HCl 150 MG Tb24  Take 1 tablet by mouth daily.       Follow-up Information    Follow up with STERN,JOSEPH D, MD In 4 weeks.   Specialty:  Neurosurgery   Contact information:   [address] [place] [address] [phone]       Signed: Consuella Lose, Loletha Grayer 02/12/2015, 1:23 PM

## 2015-02-12 NOTE — Clinical Social Work Placement (Signed)
   CLINICAL SOCIAL WORK PLACEMENT  NOTE  Date:  02/12/2015  Patient Details  Name: Jodi Colon MRN: [id number] Date of Birth: 1930-04-30  Clinical Social Work is seeking post-discharge placement for this patient at the [place] level of care (*CSW will initial, date and re-position this form in  chart as items are completed):  Yes   Patient/family provided with [place] Work Department's list of facilities offering this level of care within the geographic area requested by the patient (or if unable, by the patient's family).  Yes   Patient/family informed of their freedom to choose among providers that offer the needed level of care, that participate in Medicare, Medicaid or managed care program needed by the patient, have an available bed and are willing to accept the patient.  Yes   Patient/family informed of [place]'s ownership interest in [facility] and [facility], as well as of the fact that they are under no obligation to receive care at these facilities.  PASRR submitted to EDS on       PASRR number received on       Existing PASRR number confirmed on 02/12/15     FL2 transmitted to all facilities in geographic area requested by pt/family on 02/12/15     FL2 transmitted to all facilities within larger geographic area on       Patient informed that his/her managed care company has contracts with or will negotiate with certain facilities, including the following:            Patient/family informed of bed offers received.  Patient chooses bed at       Physician recommends and patient chooses bed at      Patient to be transferred to   on  .  Patient to be transferred to facility by       Patient family notified on   of transfer.  Name of family member notified:        PHYSICIAN Please sign FL2     Additional Comment:    _______________________________________________ Greta Doom, LCSW 02/12/2015, 8:47 AM

## 2015-02-12 NOTE — Care Management Important Message (Signed)
Important Message  Patient Details  Name: Jodi Colon MRN: [id number] Date of Birth: 12-19-1929   Medicare Important Message Given:  Yes-second notification given    Delorse Lek 02/12/2015, 2:09 PM

## 2015-02-12 NOTE — Clinical Social Work Placement (Signed)
   CLINICAL SOCIAL WORK PLACEMENT  NOTE  Date:  02/12/2015  Patient Details  Name: Jodi Colon MRN: [id number] Date of Birth: 1929/08/30  Clinical Social Work is seeking post-discharge placement for this patient at the [place] level of care (*CSW will initial, date and re-position this form in  chart as items are completed):  Yes   Patient/family provided with [place] Work Department's list of facilities offering this level of care within the geographic area requested by the patient (or if unable, by the patient's family).  Yes   Patient/family informed of their freedom to choose among providers that offer the needed level of care, that participate in Medicare, Medicaid or managed care program needed by the patient, have an available bed and are willing to accept the patient.  Yes   Patient/family informed of [place]'s ownership interest in [facility] and [facility], as well as of the fact that they are under no obligation to receive care at these facilities.  PASRR submitted to EDS on       PASRR number received on       Existing PASRR number confirmed on 02/12/15     FL2 transmitted to all facilities in geographic area requested by pt/family on 02/12/15     FL2 transmitted to all facilities within larger geographic area on       Patient informed that his/her managed care company has contracts with or will negotiate with certain facilities, including the following:        Yes   Patient/family informed of bed offers received.  Patient chooses bed at [facility]     Physician recommends and patient chooses bed at      Patient to be transferred to [facility] on 02/12/15.  Patient to be transferred to facility by [facility]      Patient family notified on 02/12/15 of transfer.  Name of family member notified:  Marchia Meiers, daughter      PHYSICIAN       Additional Comment:     _______________________________________________ Greta Doom, LCSW 02/12/2015, 2:33 PM

## 2015-02-12 NOTE — Clinical Social Work Note (Signed)
CSW left a message for the MD at his office regarding discharge. CSW awaiting a call back from MD.   CSW also informed spoke with the pt's daughter Davy Pique to inform her that the pt has a bed at [facility], but awaiting a call from the MD to determine if the pt will discharge today.   [place], MSW, [place]

## 2015-02-12 NOTE — Clinical Social Work Note (Signed)
Clinical Social Work Assessment  Patient Details  Name: Jodi Colon MRN: [id number] Date of Birth: 1929-06-08  Date of referral:  02/12/15               Reason for consult:  Facility Placement                Housing/Transportation Living arrangements for the past 2 months:  Single Family Home Source of Information:  Patient Patient Interpreter Needed:  None Criminal Activity/Legal Involvement Pertinent to Current Situation/Hospitalization:  No - Comment as needed Significant Relationships:  Adult Children Lives with:  Self Do you feel safe going back to the place where you live?  No Need for family participation in patient care:  Yes (Comment)  Care giving concerns:  N/A   Facilities manager / plan:  CSW informed that the pt would like to go to [place]. CSW met the pt at the bedside. The pt reported that she feel unsafe going home alone. Pt reported feeling she needs additional rehab prior to going home.   Employment status:  Retired Nurse, adult PT Recommendations:  Home with [place] / Referral to community resources:  [place]  Patient/Family's Response to care: Pt reported that the care in which she received has been well.   Patient/Family's Understanding of and Emotional Response to Diagnosis, Current Treatment, and Prognosis: Pt acknowledged her diagnosis and the treatment in which she received. Pt reported knowing she will be back to normal soon, but feel she will need additional aid.     Emotional Assessment Appearance:  Appears stated age Attitude/Demeanor/Rapport:   (Calm ) Affect (typically observed):  Accepting Orientation:  Oriented to Self, Oriented to Place, Oriented to  Time, Oriented to Situation Alcohol / Substance use:  Not Applicable Psych involvement (Current and /or in the community):  No (Comment)  Discharge Needs  Concerns to be addressed:  Denies Needs/Concerns at this  time Readmission within the last 30 days:  No Current discharge risk:  None Barriers to Discharge:  No Barriers Identified   Jodi Moxley, LCSW 02/12/2015, 8:42 AM

## 2015-02-12 NOTE — Progress Notes (Signed)
Pt discharging with family to SNF taking all personal belongings. IV's discontinued, dry dressing applied. Discharge instructions and paperwork provided with verbal understanding. Pt with no noted distress. Report called in to nurse at [facility].

## 2015-02-14 DIAGNOSIS — E871 Hypo-osmolality and hyponatremia: Secondary | ICD-10-CM | POA: Diagnosis present

## 2015-02-14 LAB — COMPREHENSIVE METABOLIC PANEL
ALT: 33 U/L (ref 14–54)
AST: 34 U/L (ref 15–41)
Albumin: 3.7 g/dL (ref 3.5–5.0)
Alkaline Phosphatase: 86 U/L (ref 38–126)
Anion gap: 8 (ref 5–15)
BUN: 20 mg/dL (ref 6–20)
CO2: 25 mmol/L (ref 22–32)
Calcium: 9 mg/dL (ref 8.9–10.3)
Chloride: 99 mmol/L — ABNORMAL LOW (ref 101–111)
Creatinine, Ser: 0.65 mg/dL (ref 0.44–1.00)
GFR calc Af Amer: >60 mL/min (ref >60)
GFR calc non Af Amer: >60 mL/min (ref >60)
Glucose, Bld: 117 mg/dL — ABNORMAL HIGH (ref 65–99)
Potassium: 3.6 mmol/L (ref 3.5–5.1)
Sodium: 132 mmol/L — ABNORMAL LOW (ref 135–145)
Total Bilirubin: 0.7 mg/dL (ref 0.3–1.2)
Total Protein: 6.3 g/dL — ABNORMAL LOW (ref 6.5–8.1)

## 2015-02-28 ENCOUNTER — Telehealth: Payer: Self-pay

## 2015-02-28 NOTE — Telephone Encounter (Signed)
Pt called stating Dr. Elnoria Howard put her on desmopressin for OAB. Pt stated Dr. Elnoria Howard made her aware this medication may make her sodium levels drop. Pt c/o having 2 syncope spells in the past couple of week. Pt described the first spell as "passing out for a few minutes and then coming to". Pt described the second spell as "passing out and laying on the floor for several hours until her daughter found her". Pt stated she was taken to the hospital where they found a "small" brain bleed and was just recently released. Pt c/o of since being home not being able to control her bladder at all and requested another medication to help with her OAB. Nurse collaborated with Dr. Pilar Jarvis who stated no more desmopressin and she needs a f/u in office. Pt was transferred to the front to make a f/u appt.

## 2015-03-04 ENCOUNTER — Encounter: Payer: Self-pay | Admitting: Urology

## 2015-03-04 ENCOUNTER — Ambulatory Visit (INDEPENDENT_AMBULATORY_CARE_PROVIDER_SITE_OTHER): Payer: Medicare Other | Admitting: Urology

## 2015-03-04 VITALS — BP 128/70 | HR 94 | Ht <= 58 in | Wt 114.1 lb

## 2015-03-04 DIAGNOSIS — E079 Disorder of thyroid, unspecified: Secondary | ICD-10-CM | POA: Insufficient documentation

## 2015-03-04 DIAGNOSIS — N3281 Overactive bladder: Secondary | ICD-10-CM | POA: Diagnosis not present

## 2015-03-04 DIAGNOSIS — K219 Gastro-esophageal reflux disease without esophagitis: Secondary | ICD-10-CM | POA: Insufficient documentation

## 2015-03-04 DIAGNOSIS — E039 Hypothyroidism, unspecified: Secondary | ICD-10-CM | POA: Insufficient documentation

## 2015-03-04 LAB — URINALYSIS, COMPLETE
Bilirubin, UA: NEGATIVE
Glucose, UA: NEGATIVE
Ketones, UA: NEGATIVE
Nitrite, UA: NEGATIVE
Protein, UA: NEGATIVE
Specific Gravity, UA: <1.005 — ABNORMAL LOW (ref 1.005–1.030)
Urobilinogen, Ur: 0.2 mg/dL (ref 0.2–1.0)
pH, UA: 5.5 (ref 5.0–7.5)

## 2015-03-04 LAB — BLADDER SCAN AMB NON-IMAGING

## 2015-03-04 LAB — MICROSCOPIC EXAMINATION: Bacteria, UA: NONE SEEN

## 2015-03-04 NOTE — Progress Notes (Signed)
03/04/2015 2:23 PM   Seymour Bars Junious Dresser 10-05-1929 [id number]  Referring provider: Gayland Curry, MD [address] [place], [place] [address]  Chief Complaint  Patient presents with  . Over Active Bladder    HPI:   1 - Urinary Urgency / Freqeuncy with Urge Incontinence - long h/o day and night frequency. Has h/o bladder cancer as per below. Tried DDAVP previously which caused hyponatremia and falls. She has baseline constipation. Pelvic 03/2015 with atrophic changes only, no prolapse. PVR 03/2015 "45mL". She does have h/o CVA, but no LE deficits.   2 - Superficial Bladder Cancer - h/o CIS s/p BCG induction around 2014 per report. Has been compliant with Q44mo cysto per report but none recently recorded.  3 - Atrophic Vaginitis - post-menaupasal not on estrogen. Significatn atrophic changes on exam. She has h/o breast cancer.   PMH sig for ischemic CVA then plavix then brain bleed, HTN, breast cancer, benign hyst. She is still driving and lives indepentantly.  Today "Margeret" is seen in f/u above. She had recent hospitalization after fall with dome intracerebral hemorrhage and is currently on NO GU specific meds.      PMH: Past Medical History  Diagnosis Date  . Degenerative joint disease     [facility]  . Hypertension   . Hyperlipidemia   . MRSA (methicillin resistant Staphylococcus aureus) 2008    perineal abscess  . Brain aneurysm     small, followed with regular angiograms, Dr. Estanislado Pandy  . Spinal stenosis   . DCIS (ductal carcinoma in situ) of breast 2007    Right, s/p lumpectomy/xrt   . Cerebrovascular disease 2008    s/p PTCA/stent of MCA, Deveshwar  . History of MRSA infection   . Stroke ([place])   . Arthritis   . GERD (gastroesophageal reflux disease)   . Urge incontinence   . Nocturia   . Bladder cancer ([place])   . Thyroid disease   . DDD (degenerative disc disease), cervical   . Incomplete bladder emptying   . Adnexal cyst   . Metastatic  malignant neoplasm to dome of urinary bladder [facility])     Surgical History: Past Surgical History  Procedure Laterality Date  . Cholecystectomy    . Colonoscopy  2013  . Hemorrhoid surgery  02/01/13  . Cataract extraction    . Tonsillectomy    . Breast lumpectomy    . Laparoscopic hysterectomy      Home Medications:    Medication List       This list is accurate as of: 03/04/15  2:23 PM.  Always use your most recent med list.               amLODipine 10 MG tablet  Commonly known as:  NORVASC  Take 1 tablet (10 mg total) by mouth daily.     aspirin 325 MG tablet  Take 1 tablet (325 mg total) by mouth daily.     atorvastatin 80 MG tablet  Commonly known as:  LIPITOR  Take 80 mg by mouth daily.     HYDROcodone-acetaminophen 7.5-325 MG tablet  Commonly known as:  NORCO  Take 1 tablet by mouth 3 (three) times daily as needed for moderate pain.     levothyroxine 50 MCG tablet  Commonly known as:  SYNTHROID, LEVOTHROID  Take 25-50 mcg by mouth See admin instructions. Take one tablet by mouth daily except on sundays take one and one half by mouth. Per Med record  lisinopril 40 MG tablet  Commonly known as:  PRINIVIL,ZESTRIL  Take 1 tablet (40 mg total) by mouth daily.     multivitamin tablet  Take 1 tablet by mouth daily.     PROTONIX 40 MG tablet  Generic drug:  pantoprazole  Take 40 mg by mouth 2 (two) times daily.     topiramate 25 MG tablet  Commonly known as:  TOPAMAX  Take 25 mg by mouth daily.     Venlafaxine HCl 150 MG Tb24  Take 1 tablet by mouth daily.        Allergies:  Allergies  Allergen Reactions  . Prednisone     Patient reported this date that she was allergic to PCN, rather than Prednisone.  Patient reported this date that she was allergic to PCN, rather than Prednisone.   Sarina Ill [Bactrim]     unknown  . Sulfasalazine     unknown  . Penicillins Rash    Rash Rash  . Sulfa Antibiotics Rash  . Sulfamethoxazole-Trimethoprim Rash     Family History: Family History  Problem Relation Age of Onset  . Fibromyalgia Daughter     Social History:  reports that she has never smoked. She has never used smokeless tobacco. She reports that she does not drink alcohol or use illicit drugs.  ROS: UROLOGY Frequent Urination?: Yes Hard to postpone urination?: Yes Burning/pain with urination?: No Get up at night to urinate?: Yes Leakage of urine?: Yes Urine stream starts and stops?: No Trouble starting stream?: No Do you have to strain to urinate?: No Blood in urine?: No Urinary tract infection?: No Sexually transmitted disease?: No Injury to kidneys or bladder?: No Painful intercourse?: No Weak stream?: No Currently pregnant?: No Vaginal bleeding?: No Last menstrual period?: n  Gastrointestinal Nausea?: No Vomiting?: No Indigestion/heartburn?: No Diarrhea?: No Constipation?: No  Constitutional Fever: No Night sweats?: No Weight loss?: No Fatigue?: No  Skin Skin rash/lesions?: No Itching?: No  Eyes Blurred vision?: No Double vision?: No  Ears/Nose/Throat Sore throat?: No Sinus problems?: No  Hematologic/Lymphatic Swollen glands?: No Easy bruising?: No  Cardiovascular Leg swelling?: No Chest pain?: No  Respiratory Cough?: No Shortness of breath?: No  Endocrine Excessive thirst?: No  Musculoskeletal Back pain?: No Joint pain?: Yes  Neurological Headaches?: No Dizziness?: No  Psychologic Depression?: No Anxiety?: No  Physical Exam: BP 128/70 mmHg  Pulse 94  Ht 4\' 9"  (1.448 m)  Wt 114 lb 1.6 oz (51.755 kg)  BMI 24.68 kg/m2  Constitutional:  Alert and oriented, No acute distress. HEENT: [place] AT, moist mucus membranes.  Trachea midline, no masses. Cardiovascular: No clubbing, cyanosis, or edema. Respiratory: Normal respiratory effort, no increased work of breathing. GI: Abdomen is soft, nontender, nondistended, no abdominal masses GU: No CVA tenderness. Moderate vaginal  atrophy. Cervix and uterus surgically absent. Judson Roch is chaparone.  Skin: No rashes, bruises or suspicious lesions. Lymph: No cervical or inguinal adenopathy. Neurologic: Grossly intact, no focal deficits, moving all 4 extremities. Psychiatric: Normal mood and affect.  Laboratory Data: Lab Results  Component Value Date   WBC 8.4 02/10/2015   HGB 12.9 02/10/2015   HCT 38.1 02/10/2015   MCV 88.0 02/10/2015   PLT 263 02/10/2015    Lab Results  Component Value Date   CREATININE 0.65 02/14/2015    No results found for: PSA  No results found for: TESTOSTERONE  Lab Results  Component Value Date   HGBA1C 6.0* 02/10/2015    Urinalysis    Component Value Date/Time  COLORURINE YELLOW 02/12/2015 0134   COLORURINE Yellow 09/19/2013 2127   APPEARANCEUR CLEAR 02/12/2015 0134   APPEARANCEUR Clear 09/19/2013 2127   LABSPEC 1.017 02/12/2015 0134   LABSPEC 1.017 09/19/2013 2127   PHURINE 6.5 02/12/2015 0134   PHURINE 7.0 09/19/2013 2127   GLUCOSEU NEGATIVE 02/12/2015 0134   GLUCOSEU Negative 09/19/2013 2127   HGBUR NEGATIVE 02/12/2015 0134   HGBUR Negative 09/19/2013 2127   BILIRUBINUR NEGATIVE 02/12/2015 0134   BILIRUBINUR Negative 09/19/2013 2127   KETONESUR NEGATIVE 02/12/2015 0134   KETONESUR Negative 09/19/2013 2127   PROTEINUR NEGATIVE 02/12/2015 0134   PROTEINUR 100 mg/dL 09/19/2013 2127   UROBILINOGEN 0.2 02/12/2015 0134   NITRITE NEGATIVE 02/12/2015 0134   NITRITE Negative 09/19/2013 2127   LEUKOCYTESUR SMALL* 02/12/2015 0134   LEUKOCYTESUR Negative 09/19/2013 2127    PVR - "86mL"  Assessment & Plan:   1 - Urinary Urgency / Fontaine No with Urge Incontinence - llikely OAB wet, though recurent bladder cancer also certainly in differential. Offered cysto today but she already has appt for surveillance cysto next month per report. AT her age with h/o CVA, falls, constipation I feel any sort of anticholinergic contraindicated. Discussed trial of Myrbetriq with goals of  possible symptom improvement but unlikely cure and she would like to try. 14 days 50mg  samples given.    2 - Superficial Bladder Cancer - reinforced need for surveillance cysto, especially given her irritative sympotms above.   3 - Atrophic Vaginitis - not candidate for any topical estrogens given h/o breast cancer.   4 - RTC 1 mo for surveillance cysto and discuss resonse to trial of mybetriq. She may call for RX if good response to Mybretriq 50mg .   No Follow-up on file.  Alexis Frock, [facility] [address], [place], [place] [address] [phone]

## 2015-03-11 ENCOUNTER — Other Ambulatory Visit (HOSPITAL_COMMUNITY): Payer: Self-pay | Admitting: Interventional Radiology

## 2015-03-11 DIAGNOSIS — I771 Stricture of artery: Secondary | ICD-10-CM

## 2015-03-19 ENCOUNTER — Ambulatory Visit (HOSPITAL_COMMUNITY): Payer: Medicare Other

## 2015-03-27 ENCOUNTER — Ambulatory Visit (HOSPITAL_COMMUNITY)
Admission: RE | Admit: 2015-03-27 | Discharge: 2015-03-27 | Disposition: A | Discharge status: Home / Self Care | Payer: Medicare Other | Source: Ambulatory Visit | Attending: Interventional Radiology | Admitting: Interventional Radiology

## 2015-03-27 DIAGNOSIS — I771 Stricture of artery: Secondary | ICD-10-CM

## 2015-04-01 DIAGNOSIS — C679 Malignant neoplasm of bladder, unspecified: Secondary | ICD-10-CM

## 2015-04-01 HISTORY — DX: Malignant neoplasm of bladder, unspecified: C67.9

## 2015-04-02 DIAGNOSIS — M7061 Trochanteric bursitis, right hip: Secondary | ICD-10-CM | POA: Insufficient documentation

## 2015-04-02 DIAGNOSIS — M7060 Trochanteric bursitis, unspecified hip: Secondary | ICD-10-CM | POA: Insufficient documentation

## 2015-04-08 ENCOUNTER — Other Ambulatory Visit: Payer: Self-pay | Admitting: Urology

## 2015-04-08 ENCOUNTER — Other Ambulatory Visit: Payer: Self-pay

## 2015-04-15 ENCOUNTER — Other Ambulatory Visit: Payer: Self-pay

## 2015-04-15 ENCOUNTER — Ambulatory Visit: Payer: Medicare Other | Admitting: Urology

## 2015-04-15 VITALS — BP 119/63 | HR 76 | Ht <= 58 in | Wt 115.7 lb

## 2015-04-15 DIAGNOSIS — Z8603 Personal history of neoplasm of uncertain behavior: Secondary | ICD-10-CM

## 2015-04-15 MED ORDER — NITROFURANTOIN MONOHYD MACRO 100 MG PO CAPS: 100.0000 mg | ORAL_CAPSULE | Freq: Two times a day (BID) | ORAL | Status: DC

## 2015-04-15 MED ORDER — CIPROFLOXACIN HCL 500 MG PO TABS: 500.0000 mg | ORAL_TABLET | Freq: Once | ORAL | Status: DC

## 2015-04-15 MED ORDER — LIDOCAINE HCL 2 % EX GEL: 1.0000 "application " | Freq: Once | CUTANEOUS | Status: DC

## 2015-04-15 NOTE — Progress Notes (Signed)
Patient presented to clinic today for cystoscopic surveillance of her bladder cancer. The patient states that she has had dysuria for about a week. She denies any fevers or chills. She has had frequency and urgency has progressed from her baseline. Her urinalysis today demonstrates greater than 30 white blood cells per high-power field with many bacteria. She is nitrite negative. Given the patient  Is symptomatic for a urinary tract infection with a UA that is quite suspicious, I started the patient on antibiotics, Macrobid 100 mg twice a day 7 days, and will reschedule her cystoscopic evaluation for 1 week. Cultures has been sent, we will follow-up the results.

## 2015-04-16 ENCOUNTER — Telehealth: Payer: Self-pay | Admitting: Urology

## 2015-04-16 LAB — URINALYSIS, COMPLETE
Bilirubin, UA: NEGATIVE
Glucose, UA: NEGATIVE
Ketones, UA: NEGATIVE
Nitrite, UA: NEGATIVE
Protein, UA: NEGATIVE
Specific Gravity, UA: 1.01 (ref 1.005–1.030)
Urobilinogen, Ur: 0.2 mg/dL (ref 0.2–1.0)
pH, UA: 6 (ref 5.0–7.5)

## 2015-04-16 LAB — MICROSCOPIC EXAMINATION
RBC, UA: NONE SEEN /hpf (ref 0–?)
WBC, UA: >30 /hpf — ABNORMAL HIGH (ref 0–?)

## 2015-04-16 NOTE — Telephone Encounter (Signed)
Pt saw Dr. Louis Meckel yesterday & was told she had an infection.  Someone was going to call in an antibiotic to the pharmacy, but patient has checked & nothing is there.  Please call patient [phone].

## 2015-04-17 LAB — CULTURE, URINE COMPREHENSIVE

## 2015-04-17 MED ORDER — NITROFURANTOIN MONOHYD MACRO 100 MG PO CAPS: 100.0000 mg | ORAL_CAPSULE | Freq: Two times a day (BID) | ORAL | Status: AC

## 2015-04-17 NOTE — Telephone Encounter (Signed)
Medication sent to local pharmacy.  ?

## 2015-04-21 ENCOUNTER — Ambulatory Visit (INDEPENDENT_AMBULATORY_CARE_PROVIDER_SITE_OTHER): Payer: Medicare Other | Admitting: Urology

## 2015-04-21 DIAGNOSIS — R32 Unspecified urinary incontinence: Secondary | ICD-10-CM

## 2015-04-21 DIAGNOSIS — Z87448 Personal history of other diseases of urinary system: Secondary | ICD-10-CM

## 2015-04-21 LAB — URINALYSIS, COMPLETE
Bilirubin, UA: NEGATIVE
Glucose, UA: NEGATIVE
Ketones, UA: NEGATIVE
Nitrite, UA: NEGATIVE
Specific Gravity, UA: 1.015 (ref 1.005–1.030)
Urobilinogen, Ur: 0.2 mg/dL (ref 0.2–1.0)
pH, UA: 7 (ref 5.0–7.5)

## 2015-04-21 LAB — MICROSCOPIC EXAMINATION
Epithelial Cells (non renal): NONE SEEN /hpf (ref 0–10)
RBC, UA: NONE SEEN /hpf (ref 0–?)
WBC, UA: >30 /hpf — ABNORMAL HIGH (ref 0–?)

## 2015-04-21 NOTE — Progress Notes (Signed)
04/21/2015 3:30 PM   Jodi Colon Jul 23, 1929 [id number]  Referring provider: Gayland Curry, MD [address] [place], [place] [address]  Chief Complaint  Patient presents with  . Cysto    HPI: Dr Tresa Moore:   1 - Urinary Urgency / Fontaine No with Urge Incontinence - long h/o day and night frequency. Has h/o bladder cancer as per below. Tried DDAVP previously which caused hyponatremia and falls. She has baseline constipation. Pelvic 03/2015 with atrophic changes only, no prolapse. PVR 03/2015 "24mL". She does have h/o CVA, but no LE deficits.   2 - Superficial Bladder Cancer - h/o CIS s/p BCG induction around 2014 per report. Has been compliant with Q32mo cysto per report but none recently recorded.  3 - Atrophic Vaginitis - post-menaupasal not on estrogen. Significatn atrophic changes on exam. She has h/o breast cancer.     She was given a mybetriq 50 mg by Dr. Tresa Moore. He was concerned about giving her antimuscarinics due to a past history of falls and constipation. I believe she was here for cystoscopy but it may have been held off by Dr. Louis Meckel because of a possible urinary tract infection on that visit.   PMH: Past Medical History  Diagnosis Date  . Degenerative joint disease     [facility] by the [facility]  . Hypertension   . Hyperlipidemia   . MRSA (methicillin resistant Staphylococcus aureus) 2008    perineal abscess  . Brain aneurysm     small, followed with regular angiograms, Dr. Estanislado Pandy  . Spinal stenosis   . DCIS (ductal carcinoma in situ) of breast 2007    Right, s/p lumpectomy/xrt   . Cerebrovascular disease 2008    s/p PTCA/stent of MCA, Deveshwar  . History of MRSA infection   . Stroke ([place])   . Arthritis   . GERD (gastroesophageal reflux disease)   . Urge incontinence   . Nocturia   . Bladder cancer ([place])   . Thyroid disease   . DDD (degenerative disc disease), cervical   . Incomplete bladder emptying   . Adnexal cyst   . Metastatic malignant  neoplasm to dome of urinary bladder [facility])     Surgical History: Past Surgical History  Procedure Laterality Date  . Cholecystectomy    . Colonoscopy  2013  . Hemorrhoid surgery  02/01/13  . Cataract extraction    . Tonsillectomy    . Breast lumpectomy    . Laparoscopic hysterectomy      Home Medications:    Medication List       This list is accurate as of: 04/21/15  3:30 PM.  Always use your most recent med list.               amLODipine 10 MG tablet  Commonly known as:  NORVASC  Take 1 tablet (10 mg total) by mouth daily.     aspirin 325 MG tablet  Take 1 tablet (325 mg total) by mouth daily.     atorvastatin 80 MG tablet  Commonly known as:  LIPITOR  Take 80 mg by mouth daily.     gabapentin 100 MG capsule  Commonly known as:  NEURONTIN     HYDROcodone-acetaminophen 7.5-325 MG tablet  Commonly known as:  NORCO  Take 1 tablet by mouth 3 (three) times daily as needed for moderate pain.     levothyroxine 50 MCG tablet  Commonly known as:  SYNTHROID, LEVOTHROID  Take 25-50 mcg by mouth See admin instructions. Take one tablet  by mouth daily except on sundays take one and one half by mouth. Per Med record     lisinopril 40 MG tablet  Commonly known as:  PRINIVIL,ZESTRIL  Take 1 tablet (40 mg total) by mouth daily.     multivitamin tablet  Take 1 tablet by mouth daily.     nitrofurantoin (macrocrystal-monohydrate) 100 MG capsule  Commonly known as:  MACROBID  Take 1 capsule (100 mg total) by mouth every 12 (twelve) hours.     nitrofurantoin (macrocrystal-monohydrate) 100 MG capsule  Commonly known as:  MACROBID  Take 1 capsule (100 mg total) by mouth 2 (two) times daily.     PROTONIX 40 MG tablet  Generic drug:  pantoprazole  Take 40 mg by mouth 2 (two) times daily.     tiZANidine 2 MG tablet  Commonly known as:  ZANAFLEX     topiramate 25 MG tablet  Commonly known as:  TOPAMAX  Take 25 mg by mouth daily.     Venlafaxine HCl 150 MG Tb24  Take 1  tablet by mouth daily.        Allergies:  Allergies  Allergen Reactions  . Prednisone     Patient reported this date that she was allergic to PCN, rather than Prednisone.  Patient reported this date that she was allergic to PCN, rather than Prednisone.   Sarina Ill [Bactrim]     unknown  . Sulfasalazine     unknown  . Penicillins Rash    Rash Rash  . Sulfa Antibiotics Rash  . Sulfamethoxazole-Trimethoprim Rash    Family History: Family History  Problem Relation Age of Onset  . Fibromyalgia Daughter     Social History:  reports that she has never smoked. She has never used smokeless tobacco. She reports that she does not drink alcohol or use illicit drugs.  ROS:                                        Physical Exam: There were no vitals taken for this visit.    Laboratory Data: Lab Results  Component Value Date   WBC 8.4 02/10/2015   HGB 12.9 02/10/2015   HCT 38.1 02/10/2015   MCV 88.0 02/10/2015   PLT 263 02/10/2015    Lab Results  Component Value Date   CREATININE 0.65 02/14/2015    No results found for: PSA  No results found for: TESTOSTERONE  Lab Results  Component Value Date   HGBA1C 6.0* 02/10/2015    Urinalysis    Component Value Date/Time   COLORURINE YELLOW 02/12/2015 0134   COLORURINE Yellow 09/19/2013 2127   APPEARANCEUR CLEAR 02/12/2015 0134   APPEARANCEUR Clear 09/19/2013 2127   LABSPEC 1.017 02/12/2015 0134   LABSPEC 1.017 09/19/2013 2127   PHURINE 6.5 02/12/2015 0134   PHURINE 7.0 09/19/2013 2127   GLUCOSEU Negative 04/15/2015 1608   GLUCOSEU Negative 09/19/2013 2127   HGBUR NEGATIVE 02/12/2015 0134   HGBUR Negative 09/19/2013 2127   BILIRUBINUR Negative 04/15/2015 1608   BILIRUBINUR NEGATIVE 02/12/2015 0134   BILIRUBINUR Negative 09/19/2013 2127   KETONESUR NEGATIVE 02/12/2015 0134   KETONESUR Negative 09/19/2013 2127   PROTEINUR NEGATIVE 02/12/2015 0134   PROTEINUR 100 mg/dL 09/19/2013 2127    UROBILINOGEN 0.2 02/12/2015 0134   NITRITE Negative 04/15/2015 1608   NITRITE NEGATIVE 02/12/2015 0134   NITRITE Negative 09/19/2013 2127   LEUKOCYTESUR 3+* 04/15/2015 1608  LEUKOCYTESUR SMALL* 02/12/2015 0134   LEUKOCYTESUR Negative 09/19/2013 2127    Pertinent Imaging: None  Assessment & Plan:  Unfortunately the patient had not taken her antibiotic. She was very understanding. She'll fill her prescription and will do the cystoscopy in 2 weeks. She understood that we did not want a false positive  1. History of neoplasm of bladder 2. Urgency incontinence 3. Urinary frequency    Reece Packer, MD  [facility] [address] [place], [place] [address] [phone]

## 2015-04-30 ENCOUNTER — Telehealth: Payer: Self-pay

## 2015-04-30 NOTE — Telephone Encounter (Signed)
Pt received medication and has been taking medication.

## 2015-04-30 NOTE — Telephone Encounter (Signed)
-----   Message from Ardis Hughs, MD sent at 04/29/2015  8:06 PM EST ----- Regarding: FW: Results ready for review Did this patient get treated for her infection?  If not i would recommend Cipro 500mg  BID x 7 days ----- Message -----    From: Jarvis Morgan    Sent: 04/18/2015  11:12 AM      To: Ardis Hughs, MD Subject: Results ready for review                       The following results are ready for your review:  Urine culture, comprehensive

## 2015-05-06 ENCOUNTER — Ambulatory Visit (INDEPENDENT_AMBULATORY_CARE_PROVIDER_SITE_OTHER): Payer: Medicare Other | Admitting: Urology

## 2015-05-06 VITALS — BP 157/73 | HR 74 | Ht <= 58 in | Wt 117.2 lb

## 2015-05-06 DIAGNOSIS — Z87448 Personal history of other diseases of urinary system: Secondary | ICD-10-CM | POA: Diagnosis not present

## 2015-05-06 DIAGNOSIS — Z8603 Personal history of neoplasm of uncertain behavior: Secondary | ICD-10-CM

## 2015-05-06 DIAGNOSIS — N329 Bladder disorder, unspecified: Secondary | ICD-10-CM

## 2015-05-06 DIAGNOSIS — N3281 Overactive bladder: Secondary | ICD-10-CM

## 2015-05-06 LAB — URINALYSIS, COMPLETE
Bilirubin, UA: NEGATIVE
Glucose, UA: NEGATIVE
Ketones, UA: NEGATIVE
Leukocytes, UA: NEGATIVE
Nitrite, UA: NEGATIVE
Protein, UA: NEGATIVE
RBC, UA: NEGATIVE
Specific Gravity, UA: 1.01 (ref 1.005–1.030)
Urobilinogen, Ur: 0.2 mg/dL (ref 0.2–1.0)
pH, UA: 7 (ref 5.0–7.5)

## 2015-05-06 LAB — MICROSCOPIC EXAMINATION
Bacteria, UA: NONE SEEN
RBC, UA: NONE SEEN /hpf (ref 0–?)

## 2015-05-06 MED ORDER — LIDOCAINE HCL 2 % EX GEL
1.0000 "application " | Freq: Once | CUTANEOUS | Status: AC
  Administered 2015-05-06: 1 via URETHRAL

## 2015-05-06 MED ORDER — CIPROFLOXACIN HCL 500 MG PO TABS
500.0000 mg | ORAL_TABLET | Freq: Once | ORAL | Status: AC
  Administered 2015-05-06: 500 mg via ORAL

## 2015-05-06 NOTE — Progress Notes (Addendum)
05/06/2015 5:10 PM   Jodi Colon 1930/04/29 [id number]  Referring provider: Gayland Curry, MD [address] [place], [place] [address]  Chief Complaint  Patient presents with  . Cysto    HPI:  81 year old female who presents today for office cystoscopy for follow-up of her history of bladder cancer.   As previously a patient of Dr. Bernardo Heater at [facility].   She was diagnosed with high-grade TA TCC of the bladder in 2000 11. She underwent induction BCG 6 and briefly on maintenance.   She then had an abnormal urine cytology on September 2014 which was consistent with CIS. She then underwent induction with only 4 treatments but did not complete the full course due to a nationwide backorder of the medication.   Prior to today, her last cystoscopy was 03/2014.   In the interim, she's been treated for an Escherichia coli (pansensitive) UTI with a negative UA today.     She is also very bothered by urinary frequency and urge incontinence. She's had Botox in the past.   She does have a history of CVA previously on Plavix complicated by fall with intermittent cerebral hemorrhage.   She is not currently on any blood thinning medications other than aspirin.   PMH: Past Medical History  Diagnosis Date  . Degenerative joint disease     [facility]  . Hypertension   . Hyperlipidemia   . MRSA (methicillin resistant Staphylococcus aureus) 2008    perineal abscess  . Brain aneurysm     small, followed with regular angiograms, Dr. Estanislado Pandy  . Spinal stenosis   . DCIS (ductal carcinoma in situ) of breast 2007    Right, s/p lumpectomy/xrt   . Cerebrovascular disease 2008    s/p PTCA/stent of MCA, Deveshwar  . History of MRSA infection   . Stroke ([place])   . Arthritis   . GERD (gastroesophageal reflux disease)   . Urge incontinence   . Nocturia   . Bladder cancer ([place])   . Thyroid disease   . DDD (degenerative disc disease), cervical   . Incomplete bladder  emptying   . Adnexal cyst   . 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 Cholecystectomy    . Colonoscopy  2013  . Hemorrhoid surgery  02/01/13  . Cataract extraction    . Tonsillectomy    . Breast lumpectomy    . Laparoscopic hysterectomy      Home Medications:    Medication List       This list is accurate as of: 05/06/15  5:10 PM.  Always use your most recent med list.               amLODipine 10 MG tablet  Commonly known as:  NORVASC  Take 1 tablet (10 mg total) by mouth daily.     aspirin 81 MG tablet  Take 81 mg by mouth daily.     atorvastatin 80 MG tablet  Commonly known as:  LIPITOR  Take 80 mg by mouth daily.     gabapentin 100 MG capsule  Commonly known as:  NEURONTIN     HYDROcodone-acetaminophen 7.5-325 MG tablet  Commonly known as:  NORCO  Take 1 tablet by mouth 3 (three) times daily as needed for moderate pain.     levothyroxine 50 MCG tablet  Commonly known as:  SYNTHROID, LEVOTHROID  Take 25-50 mcg  by mouth See admin instructions. Take one tablet by mouth daily except on sundays take one and one half by mouth. Per Med record     lisinopril 40 MG tablet  Commonly known as:  PRINIVIL,ZESTRIL  Take 1 tablet (40 mg total) by mouth daily.     multivitamin tablet  Take 1 tablet by mouth daily.     PROTONIX 40 MG tablet  Generic drug:  pantoprazole  Take 40 mg by mouth 2 (two) times daily.     tiZANidine 2 MG tablet  Commonly known as:  ZANAFLEX     topiramate 25 MG tablet  Commonly known as:  TOPAMAX  Take 25 mg by mouth daily.     Venlafaxine HCl 150 MG Tb24  Take 1 tablet by mouth daily.        Allergies:  Allergies  Allergen Reactions  . Prednisone     Patient reported this date that she was allergic to PCN, rather than Prednisone.  Patient reported this date that she was allergic to PCN, rather than Prednisone.   Sarina Ill [Bactrim]       unknown  . Sulfasalazine     unknown  . Penicillins Rash    Rash Rash  . Sulfa Antibiotics Rash  . Sulfamethoxazole-Trimethoprim Rash    Family History: Family History  Problem Relation Age of Onset  . Fibromyalgia Daughter     Social History:  reports that she has never smoked. She has never used smokeless tobacco. She reports that she does not drink alcohol or use illicit drugs.   Physical Exam: BP 157/73 mmHg  Pulse 74  Ht 4\' 9"  (1.448 m)  Wt 117 lb 3.2 oz (53.162 kg)  BMI 25.36 kg/m2  Constitutional:  Alert and oriented, No acute distress. HEENT: [place] AT, moist mucus membranes.  Trachea midline, no masses. Cardiovascular: No clubbing, cyanosis, or edema. Respiratory: Normal respiratory effort, no increased work of breathing. GI: Abdomen is soft, nontender, nondistended, no abdominal masses GU: No CVA tenderness.  Normal external genitalia. Normal urethral meatus. Neurologic: Grossly intact, no focal deficits, moving all 4 extremities. Psychiatric: Normal mood and affect.  Laboratory Data: Lab Results  Component Value Date   WBC 8.4 02/10/2015   HGB 12.9 02/10/2015   HCT 38.1 02/10/2015   MCV 88.0 02/10/2015   PLT 263 02/10/2015    Lab Results  Component Value Date   CREATININE 0.65 02/14/2015    Lab Results  Component Value Date   HGBA1C 6.0* 02/10/2015    Urinalysis Results for orders placed or performed in visit on 05/06/15  Microscopic Examination  Result Value Ref Range   WBC, UA 0-5 0 -  5 /hpf   RBC, UA None seen 0 -  2 /hpf   Epithelial Cells (non renal) 0-10 0 - 10 /hpf   Mucus, UA Present (A) Not Estab.   Bacteria, UA None seen None seen/Few  Urinalysis, Complete  Result Value Ref Range   Specific Gravity, UA 1.010 1.005 - 1.030   pH, UA 7.0 5.0 - 7.5   Color, UA Yellow Yellow   Appearance Ur Clear Clear   Leukocytes, UA Negative Negative   Protein, UA Negative Negative/Trace   Glucose, UA Negative Negative   Ketones, UA Negative  Negative   RBC, UA Negative Negative   Bilirubin, UA Negative Negative   Urobilinogen, Ur 0.2 0.2 - 1.0 mg/dL   Nitrite, UA Negative Negative   Microscopic Examination See below:     Pertinent Imaging: Most  recent uppertract imaging 2014, report in care everywhere    Cystoscopy Procedure Note  Patient identification was confirmed, informed consent was obtained, and patient was prepped using Betadine solution.  Lidocaine jelly was administered per urethral meatus.    Preoperative abx where received prior to procedure.    Procedure: - Flexible cystoscope introduced, without any difficulty.   - Thorough search of the bladder revealed:    normal urethral meatus     Erythematous velvety patches  Of erythema involving the posterior and left lateral wall of the bladder highly concerning for CIS    no stones    no ulcers     no tumors    no urethral polyps    no trabeculation  - Ureteral orifices were normal in position and appearance.  Post-Procedure: - Patient tolerated the procedure well     Assessment & Plan:    1. History of neoplasm of bladder  history of bladder cancer as above.  Cystoscopy today is highly suspicious for recurrent CIS with erythema on the posterior and left lateral wall of the bladder. Explained to patient this may be residual from UTI which was treated several weeks ago, however, given her history, I do feel that it is prudent to proceed with biopsy. Urine cytology sent today. We discussed the risks and benefits of biopsy. Given her history of ischemic stroke, procedure be done and aspirin 81 mg. We will also obtain bilateral retrogrades at the time given no recent upper tract imaging. -f/u urine cytology -continue ASA 81 mg -book cysto/ bladder biopsy in OR, B RTG - Urinalysis, Complete - ciprofloxacin (CIPRO) tablet 500 mg; Take 1 tablet (500 mg total) by mouth once. - lidocaine (XYLOCAINE) 2 % jelly 1 application; Place 1 application into the urethra  once.  2. Lesion of bladder As above  3. OAB (overactive bladder)  Will discuss further after biopsy. If CIS has recurred, this may be exacerbating her irritative voiding symptoms.   Return for schedule surgery.  Hollice Espy, MD  [facility] [address], [place], [place] [address] [phone]  Addendum: Urine cytology positive for dysplasia.

## 2015-05-07 ENCOUNTER — Telehealth: Payer: Self-pay | Admitting: Radiology

## 2015-05-07 NOTE — Telephone Encounter (Signed)
LMOM. Need to notify pt of surgery instructions.

## 2015-05-07 NOTE — Telephone Encounter (Signed)
Notified pt of surgery scheduled 05/20/15, pre-admit appt on 12/9 @9 :00 and to call day prior to surgery for arrival time to SDS. Pt advised to remain on ASA 81mg  per Dr Erlene Quan. Pt voices understanding.

## 2015-05-09 ENCOUNTER — Inpatient Hospital Stay: Admission: RE | Admit: 2015-05-09 | Payer: Medicare Other | Source: Ambulatory Visit

## 2015-05-12 ENCOUNTER — Encounter
Admission: RE | Admit: 2015-05-12 | Discharge: 2015-05-12 | Disposition: A | Discharge status: Home / Self Care | Payer: Medicare Other | Source: Ambulatory Visit | Attending: Urology | Admitting: Urology

## 2015-05-12 DIAGNOSIS — Z01812 Encounter for preprocedural laboratory examination: Secondary | ICD-10-CM | POA: Insufficient documentation

## 2015-05-12 HISTORY — DX: Personal history of transient ischemic attack (TIA), and cerebral infarction without residual deficits: Z86.73

## 2015-05-12 HISTORY — DX: Headache: R51

## 2015-05-12 HISTORY — DX: Personal history of Methicillin resistant Staphylococcus aureus infection: Z86.14

## 2015-05-12 HISTORY — DX: Malignant neoplasm of unspecified site of unspecified female breast: C50.919

## 2015-05-12 HISTORY — DX: Personal history of other diseases of the digestive system: Z87.19

## 2015-05-12 HISTORY — DX: Headache, unspecified: R51.9

## 2015-05-12 HISTORY — DX: Chronic kidney disease, unspecified: N18.9

## 2015-05-12 LAB — CBC
HCT: 34.2 % — ABNORMAL LOW (ref 35.0–47.0)
Hemoglobin: 11.3 g/dL — ABNORMAL LOW (ref 12.0–16.0)
MCH: 31.1 pg (ref 26.0–34.0)
MCHC: 33.1 g/dL (ref 32.0–36.0)
MCV: 94.1 fL (ref 80.0–100.0)
Platelets: 190 10*3/uL (ref 150–440)
RBC: 3.63 MIL/uL — ABNORMAL LOW (ref 3.80–5.20)
RDW: 14.8 % — ABNORMAL HIGH (ref 11.5–14.5)
WBC: 5.6 10*3/uL (ref 3.6–11.0)

## 2015-05-12 LAB — SURGICAL PCR SCREEN
MRSA, PCR: NEGATIVE
Staphylococcus aureus: NEGATIVE

## 2015-05-12 NOTE — Patient Instructions (Signed)
  Your procedure is scheduled on: May 20, 2015 (Tuesday) Report to Day Surgery.[facility]) Second Floor To find out your arrival time please call [phone] between 1PM - 3PM on May 19, 2015 (Monday).  Remember: Instructions that are not followed completely may result in serious medical risk, up to and including death, or upon the discretion of your surgeon and anesthesiologist your surgery may need to be rescheduled.    __x__ 1. Do not eat food or drink liquids after midnight. No gum chewing or hard candies.     ____ 2. No Alcohol for 24 hours before or after surgery.   ____ 3. Bring all medications with you on the day of surgery if instructed.    _x___ 4. Notify your doctor if there is any change in your medical condition     (cold, fever, infections).     Do not wear jewelry, make-up, hairpins, clips or nail polish.  Do not wear lotions, powders, or perfumes. You may wear deodorant.  Do not shave 48 hours prior to surgery. Men may shave face and neck.  Do not bring valuables to the hospital.    [facility] is not responsible for any belongings or valuables.               Contacts, dentures or bridgework may not be worn into surgery.  Leave your suitcase in the car. After surgery it may be brought to your room.  For patients admitted to the hospital, discharge time is determined by your                treatment team.   Patients discharged the day of surgery will not be allowed to drive home.   Please read over the following fact sheets that you were given:   MRSA Information and Surgical Site Infection Prevention   ____ Take these medicines the morning of surgery with A SIP OF WATER:    1. Gabapentin  2. Lisinopril   3. Protonix   ____ Fleet Enema (as directed)   ____ Use CHG Soap as directed  ____ Use inhalers on the day of surgery  ____ Stop metformin 2 days prior to surgery    ____ Take 1/2 of usual insulin dose the night before surgery and none on  the morning of surgery.   __x__ Stop Coumadin/Plavix/aspirin on (Do not take Aspirin the morning of surgery)  ____ Stop Anti-inflammatories on    ____ Stop supplements until after surgery.    ____ Bring C-Pap to the hospital.

## 2015-05-13 ENCOUNTER — Encounter: Payer: Self-pay | Admitting: Urology

## 2015-05-16 ENCOUNTER — Encounter: Payer: Self-pay | Admitting: Urology

## 2015-05-20 ENCOUNTER — Encounter: Payer: Self-pay | Admitting: Anesthesiology

## 2015-05-20 ENCOUNTER — Ambulatory Visit
Admission: RE | Admit: 2015-05-20 | Discharge: 2015-05-20 | Disposition: A | Discharge status: Home / Self Care | Payer: Medicare Other | Source: Ambulatory Visit | Attending: Urology | Admitting: Urology

## 2015-05-20 ENCOUNTER — Ambulatory Visit: Payer: Medicare Other | Admitting: Anesthesiology

## 2015-05-20 ENCOUNTER — Encounter
Admission: RE | Disposition: A | Discharge status: Home / Self Care | Payer: Self-pay | Source: Ambulatory Visit | Attending: Urology

## 2015-05-20 DIAGNOSIS — K219 Gastro-esophageal reflux disease without esophagitis: Secondary | ICD-10-CM | POA: Diagnosis not present

## 2015-05-20 DIAGNOSIS — Z8679 Personal history of other diseases of the circulatory system: Secondary | ICD-10-CM | POA: Diagnosis not present

## 2015-05-20 DIAGNOSIS — C672 Malignant neoplasm of lateral wall of bladder: Secondary | ICD-10-CM | POA: Diagnosis not present

## 2015-05-20 DIAGNOSIS — R350 Frequency of micturition: Secondary | ICD-10-CM | POA: Diagnosis not present

## 2015-05-20 DIAGNOSIS — Z881 Allergy status to other antibiotic agents status: Secondary | ICD-10-CM | POA: Diagnosis not present

## 2015-05-20 DIAGNOSIS — C671 Malignant neoplasm of dome of bladder: Secondary | ICD-10-CM | POA: Insufficient documentation

## 2015-05-20 DIAGNOSIS — Z8719 Personal history of other diseases of the digestive system: Secondary | ICD-10-CM | POA: Insufficient documentation

## 2015-05-20 DIAGNOSIS — N3941 Urge incontinence: Secondary | ICD-10-CM | POA: Diagnosis not present

## 2015-05-20 DIAGNOSIS — Z8673 Personal history of transient ischemic attack (TIA), and cerebral infarction without residual deficits: Secondary | ICD-10-CM | POA: Diagnosis not present

## 2015-05-20 DIAGNOSIS — Z79899 Other long term (current) drug therapy: Secondary | ICD-10-CM | POA: Insufficient documentation

## 2015-05-20 DIAGNOSIS — Z90710 Acquired absence of both cervix and uterus: Secondary | ICD-10-CM | POA: Insufficient documentation

## 2015-05-20 DIAGNOSIS — Z8551 Personal history of malignant neoplasm of bladder: Secondary | ICD-10-CM | POA: Insufficient documentation

## 2015-05-20 DIAGNOSIS — I739 Peripheral vascular disease, unspecified: Secondary | ICD-10-CM | POA: Diagnosis not present

## 2015-05-20 DIAGNOSIS — Z882 Allergy status to sulfonamides status: Secondary | ICD-10-CM | POA: Insufficient documentation

## 2015-05-20 DIAGNOSIS — M1990 Unspecified osteoarthritis, unspecified site: Secondary | ICD-10-CM | POA: Insufficient documentation

## 2015-05-20 DIAGNOSIS — R51 Headache: Secondary | ICD-10-CM | POA: Diagnosis not present

## 2015-05-20 DIAGNOSIS — Z853 Personal history of malignant neoplasm of breast: Secondary | ICD-10-CM | POA: Diagnosis not present

## 2015-05-20 DIAGNOSIS — Z9049 Acquired absence of other specified parts of digestive tract: Secondary | ICD-10-CM | POA: Insufficient documentation

## 2015-05-20 DIAGNOSIS — Z888 Allergy status to other drugs, medicaments and biological substances status: Secondary | ICD-10-CM | POA: Diagnosis not present

## 2015-05-20 DIAGNOSIS — C674 Malignant neoplasm of posterior wall of bladder: Secondary | ICD-10-CM | POA: Insufficient documentation

## 2015-05-20 DIAGNOSIS — Z880 Allergy status to penicillin: Secondary | ICD-10-CM | POA: Diagnosis not present

## 2015-05-20 DIAGNOSIS — K449 Diaphragmatic hernia without obstruction or gangrene: Secondary | ICD-10-CM | POA: Diagnosis not present

## 2015-05-20 DIAGNOSIS — R3129 Other microscopic hematuria: Secondary | ICD-10-CM | POA: Insufficient documentation

## 2015-05-20 DIAGNOSIS — C678 Malignant neoplasm of overlapping sites of bladder: Secondary | ICD-10-CM

## 2015-05-20 DIAGNOSIS — I10 Essential (primary) hypertension: Secondary | ICD-10-CM | POA: Diagnosis not present

## 2015-05-20 DIAGNOSIS — R339 Retention of urine, unspecified: Secondary | ICD-10-CM | POA: Insufficient documentation

## 2015-05-20 DIAGNOSIS — Z9849 Cataract extraction status, unspecified eye: Secondary | ICD-10-CM | POA: Insufficient documentation

## 2015-05-20 DIAGNOSIS — N3281 Overactive bladder: Secondary | ICD-10-CM | POA: Diagnosis not present

## 2015-05-20 DIAGNOSIS — E079 Disorder of thyroid, unspecified: Secondary | ICD-10-CM | POA: Diagnosis not present

## 2015-05-20 DIAGNOSIS — R311 Benign essential microscopic hematuria: Secondary | ICD-10-CM | POA: Diagnosis not present

## 2015-05-20 DIAGNOSIS — E785 Hyperlipidemia, unspecified: Secondary | ICD-10-CM | POA: Diagnosis not present

## 2015-05-20 DIAGNOSIS — D494 Neoplasm of unspecified behavior of bladder: Secondary | ICD-10-CM | POA: Diagnosis not present

## 2015-05-20 HISTORY — PX: CYSTOSCOPY WITH BIOPSY: SHX5122

## 2015-05-20 HISTORY — PX: CYSTOSCOPY W/ RETROGRADES: SHX1426

## 2015-05-20 SURGERY — CYSTOSCOPY, WITH BIOPSY
Anesthesia: General | Wound class: Clean Contaminated

## 2015-05-20 MED ORDER — HYDROCODONE-ACETAMINOPHEN 5-325 MG PO TABS: 1.0000 | ORAL_TABLET | Freq: Four times a day (QID) | ORAL | Status: DC | PRN

## 2015-05-20 MED ORDER — KETAMINE HCL 10 MG/ML IJ SOLN
INTRAMUSCULAR | Status: DC | PRN
  Administered 2015-05-20: 20 mg via INTRAVENOUS

## 2015-05-20 MED ORDER — IOTHALAMATE MEGLUMINE 43 % IV SOLN
INTRAVENOUS | Status: DC | PRN
  Administered 2015-05-20: 15 mL

## 2015-05-20 MED ORDER — FENTANYL CITRATE (PF) 100 MCG/2ML IJ SOLN
INTRAMUSCULAR | Status: DC | PRN
  Administered 2015-05-20: 50 ug via INTRAVENOUS

## 2015-05-20 MED ORDER — ONDANSETRON HCL 4 MG/2ML IJ SOLN
INTRAMUSCULAR | Status: DC | PRN
  Administered 2015-05-20: 4 mg via INTRAVENOUS

## 2015-05-20 MED ORDER — PHENYLEPHRINE HCL 10 MG/ML IJ SOLN
INTRAMUSCULAR | Status: DC | PRN
  Administered 2015-05-20 (×2): 100 ug via INTRAVENOUS

## 2015-05-20 MED ORDER — CLINDAMYCIN PHOSPHATE 600 MG/50ML IV SOLN
INTRAVENOUS | Status: AC
  Filled 2015-05-20: qty 50

## 2015-05-20 MED ORDER — EPHEDRINE SULFATE 50 MG/ML IJ SOLN
INTRAMUSCULAR | Status: DC | PRN
  Administered 2015-05-20 (×3): 10 mg via INTRAVENOUS

## 2015-05-20 MED ORDER — CLINDAMYCIN PHOSPHATE 600 MG/50ML IV SOLN
600.0000 mg | Freq: Once | INTRAVENOUS | Status: AC
  Administered 2015-05-20: 600 mg via INTRAVENOUS

## 2015-05-20 MED ORDER — LIDOCAINE HCL (CARDIAC) 20 MG/ML IV SOLN
INTRAVENOUS | Status: DC | PRN
  Administered 2015-05-20: 50 mg via INTRAVENOUS

## 2015-05-20 MED ORDER — PHENAZOPYRIDINE HCL 200 MG PO TABS: 200.0000 mg | ORAL_TABLET | Freq: Three times a day (TID) | ORAL | Status: DC | PRN

## 2015-05-20 MED ORDER — OXYBUTYNIN CHLORIDE 5 MG PO TABS: 5.0000 mg | ORAL_TABLET | Freq: Three times a day (TID) | ORAL | Status: DC | PRN

## 2015-05-20 MED ORDER — GENTAMICIN IN SALINE 1.6-0.9 MG/ML-% IV SOLN
80.0000 mg | Freq: Once | INTRAVENOUS | Status: AC
  Administered 2015-05-20: 80 mg via INTRAVENOUS
  Filled 2015-05-20: qty 50

## 2015-05-20 MED ORDER — PROPOFOL 10 MG/ML IV BOLUS
INTRAVENOUS | Status: DC | PRN
  Administered 2015-05-20: 120 mg via INTRAVENOUS

## 2015-05-20 MED ORDER — SODIUM CHLORIDE 0.9 % IV SOLN
INTRAVENOUS | Status: DC
  Administered 2015-05-20: 12:00:00 via INTRAVENOUS

## 2015-05-20 MED ORDER — FENTANYL CITRATE (PF) 100 MCG/2ML IJ SOLN: 25.0000 ug | INTRAMUSCULAR | Status: DC | PRN

## 2015-05-20 MED ORDER — DEXAMETHASONE SODIUM PHOSPHATE 10 MG/ML IJ SOLN
INTRAMUSCULAR | Status: DC | PRN
  Administered 2015-05-20: 5 mg via INTRAVENOUS

## 2015-05-20 MED ORDER — ONDANSETRON HCL 4 MG/2ML IJ SOLN: 4.0000 mg | Freq: Once | INTRAMUSCULAR | Status: DC | PRN

## 2015-05-20 SURGICAL SUPPLY — 21 items
BAG DRAIN CYSTO-URO LG1000N (MISCELLANEOUS) ×3 IMPLANT
CATH URETL 5X70 OPEN END (CATHETERS) ×3 IMPLANT
CONRAY 43 FOR UROLOGY 50M (MISCELLANEOUS) ×3 IMPLANT
CORD URO TURP 10FT (MISCELLANEOUS) ×3 IMPLANT
GLOVE BIO SURGEON STRL SZ 6.5 (GLOVE) ×3 IMPLANT
GLOVE BIO SURGEON STRL SZ7 (GLOVE) ×6 IMPLANT
GOWN STRL REUS W/ TWL LRG LVL3 (GOWN DISPOSABLE) ×4 IMPLANT
GOWN STRL REUS W/TWL LRG LVL3 (GOWN DISPOSABLE) ×2
KIT RM TURNOVER CYSTO AR (KITS) ×3 IMPLANT
PACK CYSTO AR (MISCELLANEOUS) ×3 IMPLANT
PAD GROUND ADULT SPLIT (MISCELLANEOUS) ×3 IMPLANT
PREP PVP WINGED SPONGE (MISCELLANEOUS) ×3 IMPLANT
PUMP SINGLE ACTION SAP (PUMP) IMPLANT
SENSORWIRE 0.038 NOT ANGLED (WIRE) ×3
SET CYSTO W/LG BORE CLAMP LF (SET/KITS/TRAYS/PACK) ×3 IMPLANT
SOL .9 NS 3000ML IRR  AL (IV SOLUTION) ×1
SOL .9 NS 3000ML IRR UROMATIC (IV SOLUTION) ×2 IMPLANT
SURGILUBE 2OZ TUBE FLIPTOP (MISCELLANEOUS) ×3 IMPLANT
WATER STERILE IRR 1000ML POUR (IV SOLUTION) ×3 IMPLANT
WATER STERILE IRR 3000ML UROMA (IV SOLUTION) ×3 IMPLANT
WIRE SENSOR 0.038 NOT ANGLED (WIRE) ×2 IMPLANT

## 2015-05-20 NOTE — Transfer of Care (Signed)
Immediate Anesthesia Transfer of Care Note  Patient: Jodi Colon  Procedure(s) Performed: Procedure(s): CYSTOSCOPY WITH BIOPSY (N/A) CYSTOSCOPY WITH RETROGRADE PYELOGRAM (Bilateral)  Patient Location: PACU  Anesthesia Type:General  Level of Consciousness: awake  Airway & Oxygen Therapy: Patient Spontanous Breathing and Patient connected to face mask oxygen  Post-op Assessment: Report given to RN  Post vital signs: Reviewed  Last Vitals:  Filed Vitals:   05/20/15 1131 05/20/15 1350  BP: 137/55 107/45  Pulse: 66 75  Temp: 36.2 C 35.9 C  Resp: 16 10    Complications: No apparent anesthesia complications

## 2015-05-20 NOTE — H&P (View-Only) (Signed)
05/06/2015 5:10 PM   Jodi Colon [birth year] [id number]  Referring provider: Gayland Curry, MD [address] [place], [place] [address]  Chief Complaint  Patient presents with  . Cysto    HPI:  81 year old female who presents today for office cystoscopy for follow-up of her history of bladder cancer.   As previously a patient of Dr. Bernardo Heater at [facility].   She was diagnosed with high-grade TA TCC of the bladder in 2000 11. She underwent induction BCG 6 and briefly on maintenance.   She then had an abnormal urine cytology on September 2014 which was consistent with CIS. She then underwent induction with only 4 treatments but did not complete the full course due to a nationwide backorder of the medication.   Prior to today, her last cystoscopy was 03/2014.   In the interim, she's been treated for an Escherichia coli (pansensitive) UTI with a negative UA today.     She is also very bothered by urinary frequency and urge incontinence. She's had Botox in the past.   She does have a history of CVA previously on Plavix complicated by fall with intermittent cerebral hemorrhage.   She is not currently on any blood thinning medications other than aspirin.   PMH: Past Medical History  Diagnosis Date  . Degenerative joint disease     [facility]  . Hypertension   . Hyperlipidemia   . MRSA (methicillin resistant Staphylococcus aureus) 2008    perineal abscess  . Brain aneurysm     small, followed with regular angiograms, Dr. Estanislado Pandy  . Spinal stenosis   . DCIS (ductal carcinoma in situ) of breast 2007    Right, s/p lumpectomy/xrt   . Cerebrovascular disease 2008    s/p PTCA/stent of MCA, Deveshwar  . History of MRSA infection   . Stroke ([place])   . Arthritis   . GERD (gastroesophageal reflux disease)   . Urge incontinence   . Nocturia   . Bladder cancer ([place])   . Thyroid disease   . DDD (degenerative disc disease), cervical   . Incomplete bladder  emptying   . Adnexal cyst   . 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 Cholecystectomy    . Colonoscopy  2013  . Hemorrhoid surgery  02/01/13  . Cataract extraction    . Tonsillectomy    . Breast lumpectomy    . Laparoscopic hysterectomy      Home Medications:    Medication List       This list is accurate as of: 05/06/15  5:10 PM.  Always use your most recent med list.               amLODipine 10 MG tablet  Commonly known as:  NORVASC  Take 1 tablet (10 mg total) by mouth daily.     aspirin 81 MG tablet  Take 81 mg by mouth daily.     atorvastatin 80 MG tablet  Commonly known as:  LIPITOR  Take 80 mg by mouth daily.     gabapentin 100 MG capsule  Commonly known as:  NEURONTIN     HYDROcodone-acetaminophen 7.5-325 MG tablet  Commonly known as:  NORCO  Take 1 tablet by mouth 3 (three) times daily as needed for moderate pain.     levothyroxine 50 MCG tablet  Commonly known as:  SYNTHROID, LEVOTHROID  Take 25-50 mcg  by mouth See admin instructions. Take one tablet by mouth daily except on sundays take one and one half by mouth. Per Med record     lisinopril 40 MG tablet  Commonly known as:  PRINIVIL,ZESTRIL  Take 1 tablet (40 mg total) by mouth daily.     multivitamin tablet  Take 1 tablet by mouth daily.     PROTONIX 40 MG tablet  Generic drug:  pantoprazole  Take 40 mg by mouth 2 (two) times daily.     tiZANidine 2 MG tablet  Commonly known as:  ZANAFLEX     topiramate 25 MG tablet  Commonly known as:  TOPAMAX  Take 25 mg by mouth daily.     Venlafaxine HCl 150 MG Tb24  Take 1 tablet by mouth daily.        Allergies:  Allergies  Allergen Reactions  . Prednisone     Patient reported this date that she was allergic to PCN, rather than Prednisone.  Patient reported this date that she was allergic to PCN, rather than Prednisone.   Sarina Ill [Bactrim]       unknown  . Sulfasalazine     unknown  . Penicillins Rash    Rash Rash  . Sulfa Antibiotics Rash  . Sulfamethoxazole-Trimethoprim Rash    Family History: Family History  Problem Relation Age of Onset  . Fibromyalgia Daughter     Social History:  reports that she has never smoked. She has never used smokeless tobacco. She reports that she does not drink alcohol or use illicit drugs.   Physical Exam: BP 157/73 mmHg  Pulse 74  Ht 4\' 9"  (1.448 m)  Wt 117 lb 3.2 oz (53.162 kg)  BMI 25.36 kg/m2  Constitutional:  Alert and oriented, No acute distress. HEENT: [place] AT, moist mucus membranes.  Trachea midline, no masses. Cardiovascular: No clubbing, cyanosis, or edema. Respiratory: Normal respiratory effort, no increased work of breathing. GI: Abdomen is soft, nontender, nondistended, no abdominal masses GU: No CVA tenderness.  Normal external genitalia. Normal urethral meatus. Neurologic: Grossly intact, no focal deficits, moving all 4 extremities. Psychiatric: Normal mood and affect.  Laboratory Data: Lab Results  Component Value Date   WBC 8.4 02/10/2015   HGB 12.9 02/10/2015   HCT 38.1 02/10/2015   MCV 88.0 02/10/2015   PLT 263 02/10/2015    Lab Results  Component Value Date   CREATININE 0.65 02/14/2015    Lab Results  Component Value Date   HGBA1C 6.0* 02/10/2015    Urinalysis Results for orders placed or performed in visit on 05/06/15  Microscopic Examination  Result Value Ref Range   WBC, UA 0-5 0 -  5 /hpf   RBC, UA None seen 0 -  2 /hpf   Epithelial Cells (non renal) 0-10 0 - 10 /hpf   Mucus, UA Present (A) Not Estab.   Bacteria, UA None seen None seen/Few  Urinalysis, Complete  Result Value Ref Range   Specific Gravity, UA 1.010 1.005 - 1.030   pH, UA 7.0 5.0 - 7.5   Color, UA Yellow Yellow   Appearance Ur Clear Clear   Leukocytes, UA Negative Negative   Protein, UA Negative Negative/Trace   Glucose, UA Negative Negative   Ketones, UA Negative  Negative   RBC, UA Negative Negative   Bilirubin, UA Negative Negative   Urobilinogen, Ur 0.2 0.2 - 1.0 mg/dL   Nitrite, UA Negative Negative   Microscopic Examination See below:     Pertinent Imaging: Most  recent uppertract imaging 2014, report in care everywhere    Cystoscopy Procedure Note  Patient identification was confirmed, informed consent was obtained, and patient was prepped using Betadine solution.  Lidocaine jelly was administered per urethral meatus.    Preoperative abx where received prior to procedure.    Procedure: - Flexible cystoscope introduced, without any difficulty.   - Thorough search of the bladder revealed:    normal urethral meatus     Erythematous velvety patches  Of erythema involving the posterior and left lateral wall of the bladder highly concerning for CIS    no stones    no ulcers     no tumors    no urethral polyps    no trabeculation  - Ureteral orifices were normal in position and appearance.  Post-Procedure: - Patient tolerated the procedure well     Assessment & Plan:    1. History of neoplasm of bladder  history of bladder cancer as above.  Cystoscopy today is highly suspicious for recurrent CIS with erythema on the posterior and left lateral wall of the bladder. Explained to patient this may be residual from UTI which was treated several weeks ago, however, given her history, I do feel that it is prudent to proceed with biopsy. Urine cytology sent today. We discussed the risks and benefits of biopsy. Given her history of ischemic stroke, procedure be done and aspirin 81 mg. We will also obtain bilateral retrogrades at the time given no recent upper tract imaging. -f/u urine cytology -continue ASA 81 mg -book cysto/ bladder biopsy in OR, B RTG - Urinalysis, Complete - ciprofloxacin (CIPRO) tablet 500 mg; Take 1 tablet (500 mg total) by mouth once. - lidocaine (XYLOCAINE) 2 % jelly 1 application; Place 1 application into the urethra  once.  2. Lesion of bladder As above  3. OAB (overactive bladder)  Will discuss further after biopsy. If CIS has recurred, this may be exacerbating her irritative voiding symptoms.   Return for schedule surgery.  Hollice Espy, MD  [facility] [address] [place], [place] [address] [phone]  Addendum: Urine cytology positive for dysplasia.

## 2015-05-20 NOTE — Progress Notes (Signed)
Will start cleocin on call to OR - gentamycin sent to OR with patient - notified Dorene Sorrow, RN during report

## 2015-05-20 NOTE — Op Note (Signed)
Date of procedure: 05/20/2015  Preoperative diagnosis:  1.  history of carcinoma in situ 2.  Suspicious bladder lesion  3.  microscopic hematuria  Postoperative diagnosis:  1. Same as above   Procedure: 1.  cystoscopy 2.  bilateral retrograde pyelogram 3.  Bladder biopsy 6  Surgeon: Hollice Espy, MD  Anesthesia: General  Complications: None  Intraoperative findings:  Diffuse velvety patchy erythema throughout bladder, highly suspicious for CIS  EBL:  minimal  Specimens:  Bladder biopsy 6  Drains:  none  Indication: Jodi Colon is a 81 y.o. patient with  History of carcinoma in situ found to have suspicious findings within her bladder highly suspicious for recurrence along with dysplastic urinary cells on cytology..  After reviewing the management options for treatment, she elected to proceed with the above surgical procedure(s). We have discussed the potential benefits and risks of the procedure, side effects of the proposed treatment, the likelihood of the patient achieving the goals of the procedure, and any potential problems that might occur during the procedure or recuperation. Informed consent has been obtained.  Description of procedure:  The patient was taken to the operating room and general anesthesia was induced.  The patient was placed in the dorsal lithotomy position, prepped and draped in the usual sterile fashion, and preoperative antibiotics were administered. A preoperative time-out was performed.    a 4 French rigid cystoscope was advanced per urethra into the bladder and the bladder was carefully inspected. This revealed diffuse swaths of velvety erythema along the posterior wall of the bladder, dome of the bladder, left and right lateral walls. There is a few areas of normal urothelial sparing. Trigone was normal with clear reflux of urine from both. Attention was turned to the left ureteral orifice which was cannulated with a 5 [place] open-ended ureteral  catheter and a gentle retrograde pyelogram was  Performed. This revealed a delicate ureter with a decompressed collecting system without any filling defects or suspicious lesions. The same procedure was performed on the right side which was also normal.    at this point in time, using Tabor biopsy forceps, a total of 5 biopsies were taken and targeted locations including the posterior wall, dome, left and right lateral walls, and trigone. These were passed off the table as 1 specimen labeled bladder biopsies. Each of the sites were fulgurated and hemostasis was excellent at this point. The bladder was then drained the scope was removed. She was reversed from anesthesia, and taken to the PACU in stable condition. There were no complications in this case.  Hollice Espy, M.D.

## 2015-05-20 NOTE — Discharge Instructions (Signed)
Transurethral Resection of Bladder Tumor (TURBT) or Bladder Biopsy   Definition:  Transurethral Resection of the Bladder Tumor is a surgical procedure used to diagnose and remove tumors within the bladder. TURBT is the most common treatment for early stage bladder cancer.  General instructions:     Your recent bladder surgery requires very little post hospital care but some definite precautions.  Despite the fact that no skin incisions were used, the area around the bladder incisions are raw and covered with scabs to promote healing and prevent bleeding. Certain precautions are needed to insure that the scabs are not disturbed over the next 2-4 weeks while the healing proceeds.  Because the raw surface inside your bladder and the irritating effects of urine you may expect frequency of urination and/or urgency (a stronger desire to urinate) and perhaps even getting up at night more often. This will usually resolve or improve slowly over the healing period. You may see some blood in your urine over the first 6 weeks. Do not be alarmed, even if the urine was clear for a while. Get off your feet and drink lots of fluids until clearing occurs. If you start to pass clots or don't improve call us.  Diet:  You may return to your normal diet immediately. Because of the raw surface of your bladder, alcohol, spicy foods, foods high in acid and drinks with caffeine may cause irritation or frequency and should be used in moderation. To keep your urine flowing freely and avoid constipation, drink plenty of fluids during the day (8-10 glasses). Tip: Avoid cranberry juice because it is very acidic.  Activity:  Your physical activity doesn't need to be restricted. However, if you are very active, you may see some blood in the urine. We suggest that you reduce your activity under the circumstances until the bleeding has stopped.  Bowels:  It is important to keep your bowels regular during the postoperative  period. Straining with bowel movements can cause bleeding. A bowel movement every other day is reasonable. Use a mild laxative if needed, such as milk of magnesia 2-3 tablespoons, or 2 Dulcolax tablets. Call if you continue to have problems. If you had been taking narcotics for pain, before, during or after your surgery, you may be constipated. Take a laxative if necessary.    Medication:  You should resume your pre-surgery medications unless told not to. In addition you may be given an antibiotic to prevent or treat infection. Antibiotics are not always necessary. All medication should be taken as prescribed until the bottles are finished unless you are having an unusual reaction to one of the drugs.   [facility] [address], [place], [place] [address] [phone]   General Anesthesia, Adult, Care After Refer to this sheet in the next few weeks. These instructions provide you with information on caring for yourself after your procedure. Your health care provider may also give you more specific instructions. Your treatment has been planned according to current medical practices, but problems sometimes occur. Call your health care provider if you have any problems or questions after your procedure. WHAT TO EXPECT AFTER THE PROCEDURE After the procedure, it is typical to experience:  Sleepiness.  Nausea and vomiting. HOME CARE INSTRUCTIONS  For the first 24 hours after general anesthesia:  Have a responsible person with you.  Do not drive a car. If you are alone, do not take public transportation.  Do not drink alcohol.  Do not take medicine  that has not been prescribed by your health care provider.  Do not sign important papers or make important decisions.  You may resume a normal diet and activities as directed by your health care provider.  Change bandages (dressings) as directed.  If you have questions or problems that seem related to  general anesthesia, call the hospital and ask for the anesthetist or anesthesiologist on call. SEEK MEDICAL CARE IF:  You have nausea and vomiting that continue the day after anesthesia.  You develop a rash. SEEK IMMEDIATE MEDICAL CARE IF:   You have difficulty breathing.  You have chest pain.  You have any allergic problems.   This information is not intended to replace advice given to you by your health care provider. Make sure you discuss any questions you have with your health care provider.   Document Released: 08/23/2000 Document Revised: 06/07/2014 Document Reviewed: 09/15/2011 Elsevier Interactive Patient Education [employer].

## 2015-05-20 NOTE — Anesthesia Preprocedure Evaluation (Addendum)
Anesthesia Evaluation  Patient identified by MRN, date of birth, ID band Patient awake    Reviewed: Allergy & Precautions, NPO status , Patient's Chart, lab work & pertinent test results, reviewed documented beta blocker date and time   Airway Mallampati: II  TM Distance: >3 FB     Dental  (+) Chipped   Pulmonary           Cardiovascular hypertension, Pt. on medications + Peripheral Vascular Disease       Neuro/Psych  Headaches, PSYCHIATRIC DISORDERS Depression  Neuromuscular disease CVA    GI/Hepatic hiatal hernia, GERD  Medicated and Controlled,  Endo/Other    Renal/GU Renal InsufficiencyRenal disease     Musculoskeletal  (+) Arthritis ,   Abdominal   Peds  Hematology   Anesthesia Other Findings Brain aneurysm repair. TIAs. Anemic. Bladder ca.  Reproductive/Obstetrics                           Anesthesia Physical Anesthesia Plan  ASA: III  Anesthesia Plan: General   Post-op Pain Management:    Induction: Intravenous  Airway Management Planned: Oral ETT and LMA  Additional Equipment:   Intra-op Plan:   Post-operative Plan:   Informed Consent: I have reviewed the patients History and Physical, chart, labs and discussed the procedure including the risks, benefits and alternatives for the proposed anesthesia with the patient or authorized representative who has indicated his/her understanding and acceptance.     Plan Discussed with: CRNA  Anesthesia Plan Comments:         Anesthesia Quick Evaluation

## 2015-05-20 NOTE — Anesthesia Procedure Notes (Signed)
Procedure Name: LMA Insertion Date/Time: 05/20/2015 1:10 PM Performed by: Rolla Plate Pre-anesthesia Checklist: Patient identified, Patient being monitored, Timeout performed, Emergency Drugs available and Suction available Patient Re-evaluated:Patient Re-evaluated prior to inductionOxygen Delivery Method: Circle system utilized Preoxygenation: Pre-oxygenation with 100% oxygen Intubation Type: IV induction Ventilation: Mask ventilation without difficulty LMA: LMA inserted LMA Size: 3.0 Tube type: Oral Number of attempts: 1 Placement Confirmation: positive ETCO2 and breath sounds checked- equal and bilateral Tube secured with: Tape Dental Injury: Teeth and Oropharynx as per pre-operative assessment

## 2015-05-20 NOTE — Interval H&P Note (Signed)
History and Physical Interval Note:  05/20/2015 12:56 PM  [name]  has presented today for surgery, with the diagnosis of BLADDER LESION, BX OF BLADDER CANCER  The various methods of treatment have been discussed with the patient and family. After consideration of risks, benefits and other options for treatment, the patient has consented to  Procedure(s): CYSTOSCOPY WITH BIOPSY (N/A) CYSTOSCOPY WITH RETROGRADE PYELOGRAM (Bilateral) as a surgical intervention .  The patient's history has been reviewed, patient examined, no change in status, stable for surgery.  I have reviewed the patient's chart and labs.  Questions were answered to the patient's satisfaction.     Jodi Colon

## 2015-05-20 NOTE — Anesthesia Postprocedure Evaluation (Signed)
Anesthesia Post Note  Patient: Jodi Colon  Procedure(s) Performed: Procedure(s) (LRB): CYSTOSCOPY WITH BIOPSY (N/A) CYSTOSCOPY WITH RETROGRADE PYELOGRAM (Bilateral)  Patient location during evaluation: PACU Anesthesia Type: General Level of consciousness: awake Pain management: pain level controlled Vital Signs Assessment: post-procedure vital signs reviewed and stable Respiratory status: spontaneous breathing Cardiovascular status: blood pressure returned to baseline Anesthetic complications: no    Last Vitals:  Filed Vitals:   05/20/15 1457 05/20/15 1548  BP: 139/49 137/52  Pulse: 88   Temp: 36.6 C   Resp: 16 16    Last Pain:  Filed Vitals:   05/20/15 1602  PainSc: 0-No pain                 [name]

## 2015-05-23 LAB — SURGICAL PATHOLOGY

## 2015-06-06 ENCOUNTER — Telehealth: Payer: Self-pay | Admitting: Radiology

## 2015-06-06 NOTE — Telephone Encounter (Signed)
-----   Message from Hollice Espy, MD sent at 06/03/2015  1:40 PM EST ----- Reviewed pathology over the phone with patient.  Plan for BCG x 6 as previously discussed.    Hollice Espy, MD

## 2015-06-06 NOTE — Telephone Encounter (Signed)
Please f/u

## 2015-06-06 NOTE — Telephone Encounter (Signed)
No answer. 6 weeks post surgery will be 06/24/15.

## 2015-06-11 NOTE — Telephone Encounter (Signed)
Its OK for her to start.    Hollice Espy, MD

## 2015-06-11 NOTE — Telephone Encounter (Signed)
Spoke with pt and made her aware its ok to start BCG on 06/16/15. Pt voiced understanding.

## 2015-06-11 NOTE — Telephone Encounter (Signed)
Spoke with pt in reference to BCG treatments. Pt stated that she received a call yesterday and her BCG tx were setup. Her first one is 06/16/15. Is that to soon? 6 weeks post op is 06/27/15. Please advise.

## 2015-06-16 ENCOUNTER — Encounter: Payer: Self-pay | Admitting: Obstetrics and Gynecology

## 2015-06-16 ENCOUNTER — Ambulatory Visit (INDEPENDENT_AMBULATORY_CARE_PROVIDER_SITE_OTHER): Payer: Medicare Other | Admitting: Obstetrics and Gynecology

## 2015-06-16 VITALS — BP 151/75 | HR 116 | Resp 18 | Ht <= 58 in | Wt 114.9 lb

## 2015-06-16 DIAGNOSIS — C678 Malignant neoplasm of overlapping sites of bladder: Secondary | ICD-10-CM | POA: Diagnosis not present

## 2015-06-16 LAB — URINALYSIS, COMPLETE
Bilirubin, UA: NEGATIVE
Glucose, UA: NEGATIVE
Ketones, UA: NEGATIVE
Nitrite, UA: NEGATIVE
Protein, UA: NEGATIVE
Specific Gravity, UA: 1.015 (ref 1.005–1.030)
Urobilinogen, Ur: 0.2 mg/dL (ref 0.2–1.0)
pH, UA: 5.5 (ref 5.0–7.5)

## 2015-06-16 LAB — MICROSCOPIC EXAMINATION: RBC, UA: >30 /hpf — ABNORMAL HIGH (ref 0–?)

## 2015-06-16 MED ORDER — BCG LIVE 50 MG IS SUSR: 0.2000 mL | Freq: Once | INTRAVESICAL | Status: DC

## 2015-06-16 NOTE — Progress Notes (Signed)
06/16/2015 9:43 AM   Jodi Colon Junious Dresser 26-May-1930 [id number]  Referring provider: Tracie Harrier, MD [address] [facility] [place], [place] [address]  Chief Complaint  Patient presents with  . Bladder Cancer    BCG #1    HPI: Patient is 82 year old female with a history of CIS bladder cancer. Previously a patient of Dr. Bernardo Heater at [facility]. She was diagnosed with high-grade TA TCC of the bladder in 2000 11. She underwent induction BCG 6 and briefly on maintenance. She then had an abnormal urine cytology on September 2014 which was consistent with CIS. She then underwent induction with only 4 treatments but did not complete the full course due to a nationwide backorder of the medication. Prior to today, her last cystoscopy was 03/2014.  She underwent cystoscopy by Dr. Erlene Quan on 05/06/15 demonstrating erythematous velvety patchesof erythema involving the posterior and left lateral wall of the bladder highly concerning for CIS.  05/20/15 cystoscopy, bilateral retrograde pyelogram and bladder biopsy x 6 performed by Dr. Erlene Quan. 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  She presents today for first (1 of 6) installation of induction series of BCG treatments. She denies any urinary symptoms or fevers.   PMH: Past Medical History  Diagnosis Date  . Degenerative joint disease     [facility] by [facility]  . Hypertension   . Hyperlipidemia   . MRSA (methicillin resistant Staphylococcus aureus) 2008    perineal abscess  . Spinal stenosis   . DCIS (ductal carcinoma in situ) of breast 2007    Right, s/p lumpectomy/xrt   . Cerebrovascular disease 2008    s/p PTCA/stent of MCA, [place]  . History of MRSA infection   . Stroke ([place])   . Arthritis   . GERD (gastroesophageal reflux disease)   . Urge incontinence   . Nocturia   . Thyroid disease   . DDD (degenerative disc disease), cervical   . Incomplete bladder emptying   . Adnexal cyst   . Chronic  kidney disease   . History of hiatal hernia   . Headache   . Bladder cancer ([place])   . 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 ([place])   . Breast cancer [facility]) 2007    Right Breast Lumpectomy  . History of TIA (transient ischemic attack)   . Hx MRSA infection   . Brain aneurysm     small, followed with regular angiograms, Dr. Estanislado Pandy    Surgical History: Past Surgical History  Procedure Laterality Date  . Cholecystectomy    . Colonoscopy  2013  . Hemorrhoid surgery  02/01/13  . Cataract extraction    . Tonsillectomy    . Breast lumpectomy    . Laparoscopic hysterectomy    . Abdominal hysterectomy    . Back surgery    . Fracture surgery Left     Ankle, compound fracture  . Eye surgery Bilateral     Cataract Extraction  . Bladder surgery  2012, 2014  . Brain surgery Right 2008    stent, Zacarias Pontes, [place]  . Cystoscopy with biopsy N/A 05/20/2015    Procedure: CYSTOSCOPY WITH BIOPSY;  Surgeon: Hollice Espy, MD;  Location: [facility] ORS;  Service: Urology;  Laterality: N/A;  . Cystoscopy w/ retrogrades Bilateral 05/20/2015    Procedure: CYSTOSCOPY WITH RETROGRADE PYELOGRAM;  Surgeon: Hollice Espy, MD;  Location: [facility] ORS;  Service: Urology;  Laterality: Bilateral;    Home Medications:    Medication List  This list is accurate as of: 06/16/15  9:43 AM.  Always use your most recent med list.               amLODipine 10 MG tablet  Commonly known as:  NORVASC  Take 1 tablet (10 mg total) by mouth daily.     aspirin 81 MG tablet  Take 81 mg by mouth daily.     atorvastatin 80 MG tablet  Commonly known as:  LIPITOR  Take 80 mg by mouth daily at 6 PM.     econazole nitrate 1 % cream  Apply topically.     gabapentin 100 MG capsule  Commonly known as:  NEURONTIN  Take 200 mg by mouth every morning. Take 400 mg at bedtime     HYDROcodone-acetaminophen 7.5-325 MG tablet  Commonly known as:  NORCO  Take 1 tablet by mouth 3 (three) times daily  as needed for moderate pain.     levothyroxine 50 MCG tablet  Commonly known as:  SYNTHROID, LEVOTHROID  Take 25-50 mcg by mouth See admin instructions. Take one tablet by mouth daily except on sundays take one and one half by mouth. Per Med record     lisinopril 40 MG tablet  Commonly known as:  PRINIVIL,ZESTRIL  Take 1 tablet (40 mg total) by mouth daily.     multivitamin tablet  Take 1 tablet by mouth daily.     oxybutynin 5 MG tablet  Commonly known as:  DITROPAN  Take 1 tablet (5 mg total) by mouth every 8 (eight) hours as needed for bladder spasms.     phenazopyridine 200 MG tablet  Commonly known as:  PYRIDIUM  Take 1 tablet (200 mg total) by mouth 3 (three) times daily as needed for pain.     PROTONIX 40 MG tablet  Generic drug:  pantoprazole  Take 40 mg by mouth 2 (two) times daily.     tiZANidine 2 MG tablet  Commonly known as:  ZANAFLEX  Take 2 mg by mouth at bedtime.     topiramate 25 MG tablet  Commonly known as:  TOPAMAX  Take 25 mg by mouth daily.     Venlafaxine HCl 150 MG Tb24  Take 1 tablet by mouth daily.        Allergies:  Allergies  Allergen Reactions  . Prednisone     Patient reported this date that she was allergic to PCN, rather than Prednisone.  Patient reported this date that she was allergic to PCN, rather than Prednisone.   Sarina Ill [Bactrim] Itching  . Sulfasalazine Swelling    "swelling of face"  . Penicillins Rash    Rash Rash  . Sulfa Antibiotics Rash  . Sulfamethoxazole-Trimethoprim Rash    Family History: Family History  Problem Relation Age of Onset  . Fibromyalgia Daughter     Social History:  reports that she has never smoked. She has never used smokeless tobacco. She reports that she does not drink alcohol or use illicit drugs.  ROS: UROLOGY Frequent Urination?: No Hard to postpone urination?: Yes Burning/pain with urination?: No Get up at night to urinate?: No Leakage of urine?: No Urine stream starts and  stops?: No Trouble starting stream?: No Do you have to strain to urinate?: No Blood in urine?: No Urinary tract infection?: No Sexually transmitted disease?: No Injury to kidneys or bladder?: No Painful intercourse?: No Weak stream?: No Currently pregnant?: No Vaginal bleeding?: No Last menstrual period?: n  Gastrointestinal Nausea?: No Vomiting?: No Indigestion/heartburn?:  No Diarrhea?: No Constipation?: No  Constitutional Fever: No Night sweats?: No Weight loss?: Yes Fatigue?: No  Skin Skin rash/lesions?: No Itching?: No  Eyes Blurred vision?: Yes Double vision?: No  Ears/Nose/Throat Sore throat?: No Sinus problems?: No  Hematologic/Lymphatic Swollen glands?: No Easy bruising?: No  Cardiovascular Leg swelling?: No Chest pain?: No  Respiratory Cough?: Yes Shortness of breath?: No  Endocrine Excessive thirst?: No  Musculoskeletal Back pain?: No Joint pain?: No  Neurological Headaches?: No Dizziness?: No  Psychologic Depression?: No Anxiety?: No  Physical Exam: BP 151/75 mmHg  Pulse 116  Resp 18  Ht 4\' 9"  (1.448 m)  Wt 114 lb 14.4 oz (52.118 kg)  BMI 24.86 kg/m2  Constitutional:  Alert and oriented, No acute distress. HEENT: [place] AT, moist mucus membranes.  Trachea midline, no masses. Cardiovascular: No clubbing, cyanosis, or edema. Respiratory: Normal respiratory effort, no increased work of breathing. Skin: No rashes, bruises or suspicious lesions. Neurologic: Grossly intact, no focal deficits, moving all 4 extremities. Psychiatric: Normal mood and affect.  Laboratory Data:   Urinalysis .this  Pertinent Imaging:  Assessment & Plan:  1. Malignant neoplasm of overlapping sites of bladder ([place])-  UA today highly suspicious for infection though patient is asymptomatic.  Urine sent for culture.  If negative we will reschedule treatment for later this week.  Will call patient with results.  -BCG education provided and consent  signed - Urinalysis, Complete -Urine Culture  Return for will call with UC results to reschedule; AVS instructions.  These notes generated with voice recognition software. I apologize for typographical errors.  Herbert Moors, [facility] [address], [place], [place] [address] [phone]

## 2015-06-16 NOTE — Patient Instructions (Signed)
   BCG Instillation Patient Education  BCG installations are done by inserting a catheter into the opening of the urethra, then into the bladder. When the catheter is in the bladder, medication (BCG) will be passed through the catheter into the bladder. The usual course of treatment is once a week for six weeks. Treatment may be postponed depending on urinalysis.  Before Instillation Please be on time for your instillation treatment and prepared to provide a urine specimen to check for infection prior to treatment. Inform your doctor if you have felt feverish, tired or had chills since your last treatment or if you have been urinating any bright red blood before your instillation Empty your bladder just before the instillation and give urine specimen to the nurse to check  During Instillation The medication will be instilled into your bladder through the catheter In most cases, the catheter will be removed from the bladder after the instillation is completed The medication should be retained in your bladder for 2 hours to obtain the best results  After Instillation Make sure you go straight home after procedure to avoid possibly having to use a public restroom. You should change positions several times once you get home. Ideally, you should reposition from left side to right side and also lie upon your back and your abdomen, changing these positions every 15 minutes to maximize bladder exposure to the medication for one hour. Now that you have retained the medication in your bladder for the past two hours, there are several things you must do: Sit down on the toilet to urinate and fully empty your bladder. After urinating, pour two (2) cups of household bleach (Clorox or equivalent) into the toilet and let sit for 15-20 minutes before flushing. Repeat the above process each time you urinate for six (6) hours after each treatment Wash your hands and genital area thoroughly after you  urinate Drink plenty of fluids after your instillation to flush your bladder.  Symptoms to watch for You may experience some burning and frequency of urination, generalized discomfort, tiredness, flu-like symptoms or joint pain after your treatment. There may be some blood in the urine. You should call if there is heavy bleeding in the urine or if there is fever over 100.5F or chills.  Follow up plan Bladder cancer has a strong tendency to come back throughout your lifetime. It is extremely important that you keep your BCG appointment and have lifelong follow up with your urologist to look in the bladder periodically and check the urine for cancer cells.  [facility] [address], [address] ,  [address] [phone]    

## 2015-06-18 LAB — CULTURE, URINE COMPREHENSIVE

## 2015-06-20 ENCOUNTER — Telehealth: Payer: Self-pay

## 2015-06-20 NOTE — Telephone Encounter (Signed)
-----   Message from Roda Shutters, [place] sent at 06/19/2015  2:21 PM EST ----- Please notify patient that her urine culture was negative for infection though it did grow out a small amount of bacteria is not clinically significant. She needs to be rescheduled for her BCG installations as soon as possible. Thanks

## 2015-06-20 NOTE — Telephone Encounter (Signed)
Spoke with pt and made aware of -ucx. Pt already has her BCG appts made. Will add a tx to the end.

## 2015-06-23 ENCOUNTER — Other Ambulatory Visit: Payer: Self-pay | Admitting: Internal Medicine

## 2015-06-23 DIAGNOSIS — Z1231 Encounter for screening mammogram for malignant neoplasm of breast: Secondary | ICD-10-CM

## 2015-06-26 ENCOUNTER — Encounter: Payer: Self-pay | Admitting: Obstetrics and Gynecology

## 2015-06-26 ENCOUNTER — Ambulatory Visit (INDEPENDENT_AMBULATORY_CARE_PROVIDER_SITE_OTHER): Payer: Medicare Other | Admitting: Obstetrics and Gynecology

## 2015-06-26 VITALS — BP 117/66 | HR 88 | Resp 16 | Ht <= 58 in | Wt 115.2 lb

## 2015-06-26 DIAGNOSIS — C679 Malignant neoplasm of bladder, unspecified: Secondary | ICD-10-CM | POA: Diagnosis not present

## 2015-06-26 LAB — MICROSCOPIC EXAMINATION: WBC, UA: >30 /HPF — ABNORMAL HIGH

## 2015-06-26 LAB — URINALYSIS, COMPLETE
Bilirubin, UA: NEGATIVE
Glucose, UA: NEGATIVE
Nitrite, UA: NEGATIVE
Specific Gravity, UA: 1.02 (ref 1.005–1.030)
Urobilinogen, Ur: 0.2 mg/dL (ref 0.2–1.0)
pH, UA: 5 (ref 5.0–7.5)

## 2015-06-26 MED ORDER — BCG LIVE 50 MG IS SUSR
3.2400 mL | Freq: Once | INTRAVESICAL | Status: AC
  Administered 2015-06-26: 81 mg via INTRAVESICAL

## 2015-06-26 NOTE — Progress Notes (Signed)
BCG Bladder Instillation  BCG # 1  Due to Bladder Cancer patient is present today for a BCG treatment. Patient was cleaned and prepped in a sterile fashion with betadine and lidocaine 2% jelly was instilled into the urethra.  Due to urinary leakage,  A 14 FR Foley catheter was inserted, urine return was noted 41ml, urine was yellow in color.  The catheter balloon was inflated with 10 mL of sterile water.  90ml of reconstituted BCG was instilled into the bladder. The . Patient tolerated well, no complications were noted  Preformed by: Herbert Moors, FNP  Follow up/ Additional notes: Assisted by: Golden Hurter, CMA

## 2015-06-26 NOTE — Progress Notes (Signed)
1:12 PM   Jodi Colon 12-02-1929 [id number]  Referring provider: Tracie Harrier, MD [address] [facility] [place],  [address]  Chief Complaint  Patient presents with  . Bladder Cancer    BCG #1    HPI: Patient is 82 year old female with a history of CIS bladder cancer. Previously a patient of Dr. Bernardo Heater at [facility]. She was diagnosed with high-grade TA TCC of the bladder in 2000 11. She underwent induction BCG 6 and briefly on maintenance. She then had an abnormal urine cytology on September 2014 which was consistent with CIS. She then underwent induction with only 4 treatments but did not complete the full course due to a nationwide backorder of the medication. Prior to today, her last cystoscopy was 03/2014.  She underwent cystoscopy by Dr. Erlene Quan on 05/06/15 demonstrating erythematous velvety patchesof erythema involving the posterior and left lateral wall of the bladder highly concerning for CIS.  05/20/15 cystoscopy, bilateral retrograde pyelogram and bladder biopsy x 6 performed by Dr. Erlene Quan. 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  She presents today for first (1 of 6) installation of induction series of BCG treatments. She denies any changes in urinary symptoms or fevers. Patient reports that she has significant difficulty controlling her bladder. She is taken multiple medications in the past without improvement in her symptoms.  He is concerned that she will not be able to hold in the BCG installation for the recommended time of 2 hours. She is willing to have a catheter placed to hold in the fluid for the recommended time.  PMH: Past Medical History  Diagnosis Date  . Degenerative joint disease     [facility] by the [facility]  . Hypertension   . Hyperlipidemia   . MRSA (methicillin resistant Staphylococcus aureus) 2008    perineal abscess  . Spinal stenosis   . DCIS (ductal carcinoma in situ) of breast 2007    Right, s/p  lumpectomy/xrt   . Cerebrovascular disease 2008    s/p PTCA/stent of MCA, Deveshwar  . History of MRSA infection   . Stroke ([place])   . Arthritis   . GERD (gastroesophageal reflux disease)   . Urge incontinence   . Nocturia   . Thyroid disease   . DDD (degenerative disc disease), cervical   . Incomplete bladder emptying   . Adnexal cyst   . Chronic kidney disease   . History of hiatal hernia   . Headache   . Bladder cancer ([place])   . 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 ([place])   . Breast cancer [facility]) 2007    Right Breast Lumpectomy  . History of TIA (transient ischemic attack)   . Hx MRSA infection   . Brain aneurysm     small, followed with regular angiograms, Dr. Estanislado Pandy    Surgical History: Past Surgical History  Procedure Laterality Date  . Cholecystectomy    . Colonoscopy  2013  . Hemorrhoid surgery  02/01/13  . Cataract extraction    . Tonsillectomy    . Breast lumpectomy    . Laparoscopic hysterectomy    . Abdominal hysterectomy    . Back surgery    . Fracture surgery Left     Ankle, compound fracture  . Eye surgery Bilateral     Cataract Extraction  . Bladder surgery  2012, 2014  . Brain surgery Right 2008    stent, [facility], [place]  . Cystoscopy with biopsy N/A 05/20/2015    Procedure:  CYSTOSCOPY WITH BIOPSY;  Surgeon: Hollice Espy, MD;  Location: [facility] ORS;  Service: Urology;  Laterality: N/A;  . Cystoscopy w/ retrogrades Bilateral 05/20/2015    Procedure: CYSTOSCOPY WITH RETROGRADE PYELOGRAM;  Surgeon: Hollice Espy, MD;  Location: [facility] ORS;  Service: Urology;  Laterality: Bilateral;    Home Medications:    Medication List       This list is accurate as of: 06/26/15 11:59 PM.  Always use your most recent med list.               amLODipine 10 MG tablet  Commonly known as:  NORVASC  Take 1 tablet (10 mg total) by mouth daily.     aspirin 81 MG tablet  Take 81 mg by mouth daily.     atorvastatin 80 MG tablet   Commonly known as:  LIPITOR  Take 80 mg by mouth daily at 6 PM.     econazole nitrate 1 % cream  Apply topically.     ferrous sulfate 325 (65 FE) MG EC tablet  Take by mouth.     gabapentin 100 MG capsule  Commonly known as:  NEURONTIN  Take 200 mg by mouth every morning. Take 400 mg at bedtime     HYDROcodone-acetaminophen 7.5-325 MG tablet  Commonly known as:  NORCO  Take 1 tablet by mouth 3 (three) times daily as needed for moderate pain.     levothyroxine 50 MCG tablet  Commonly known as:  SYNTHROID, LEVOTHROID  Take 25-50 mcg by mouth See admin instructions. Take one tablet by mouth daily except on sundays take one and one half by mouth. Per Med record     lisinopril 40 MG tablet  Commonly known as:  PRINIVIL,ZESTRIL  Take 1 tablet (40 mg total) by mouth daily.     multivitamin tablet  Take 1 tablet by mouth daily.     phenazopyridine 200 MG tablet  Commonly known as:  PYRIDIUM  Take 1 tablet (200 mg total) by mouth 3 (three) times daily as needed for pain.     PROTONIX 40 MG tablet  Generic drug:  pantoprazole  Take 40 mg by mouth 2 (two) times daily.     tiZANidine 2 MG tablet  Commonly known as:  ZANAFLEX  Take 2 mg by mouth at bedtime.     topiramate 25 MG tablet  Commonly known as:  TOPAMAX  Take 25 mg by mouth daily.     Venlafaxine HCl 150 MG Tb24  Take 1 tablet by mouth daily.        Allergies:  Allergies  Allergen Reactions  . Prednisone     Patient reported this date that she was allergic to PCN, rather than Prednisone.  Patient reported this date that she was allergic to PCN, rather than Prednisone.   Sarina Ill [Bactrim] Itching  . Sulfasalazine Swelling    "swelling of face"  . Penicillins Rash    Rash Rash  . Sulfa Antibiotics Rash  . Sulfamethoxazole-Trimethoprim Rash    Family History: Family History  Problem Relation Age of Onset  . Fibromyalgia Daughter     Social History:  reports that she has never smoked. She has never  used smokeless tobacco. She reports that she does not drink alcohol or use illicit drugs.  ROS: UROLOGY Frequent Urination?: Yes Hard to postpone urination?: No Burning/pain with urination?: No Get up at night to urinate?: No Leakage of urine?: No Urine stream starts and stops?: No Trouble starting stream?: No Do you  have to strain to urinate?: No Blood in urine?: No Urinary tract infection?: No Sexually transmitted disease?: No Injury to kidneys or bladder?: No Painful intercourse?: No Weak stream?: No Currently pregnant?: No Vaginal bleeding?: No Last menstrual period?: n  Gastrointestinal Nausea?: No Vomiting?: No Indigestion/heartburn?: No Diarrhea?: No Constipation?: No  Constitutional Fever: No Night sweats?: No Weight loss?: No Fatigue?: No  Skin Skin rash/lesions?: No Itching?: No  Eyes Blurred vision?: Yes Double vision?: No  Ears/Nose/Throat Sore throat?: No Sinus problems?: No  Hematologic/Lymphatic Swollen glands?: No Easy bruising?: No  Cardiovascular Leg swelling?: No Chest pain?: No  Respiratory Cough?: No Shortness of breath?: No  Endocrine Excessive thirst?: No  Musculoskeletal Back pain?: No Joint pain?: Yes  Neurological Headaches?: No Dizziness?: No  Psychologic Depression?: No Anxiety?: No  Physical Exam: BP 117/66 mmHg  Pulse 88  Resp 16  Ht 4\' 9"  (1.448 m)  Wt 115 lb 3.2 oz (52.254 kg)  BMI 24.92 kg/m2  Constitutional:  Alert and oriented, No acute distress. HEENT: [place] AT, moist mucus membranes.  Trachea midline, no masses. Cardiovascular: No clubbing, cyanosis, or edema. Respiratory: Normal respiratory effort, no increased work of breathing. Skin: No rashes, bruises or suspicious lesions. Neurologic: Grossly intact, no focal deficits, moving all 4 extremities. Psychiatric: Normal mood and affect.  Laboratory Data:   Urinalysis Results for orders placed or performed in visit on 06/26/15  Microscopic  Examination  Result Value Ref Range   WBC, UA >30 (H) 0 -  5 /hpf   RBC, UA 3-10 (A) 0 -  2 /hpf   Epithelial Cells (non renal) 0-10 0 - 10 /hpf   Casts Present (A) None seen /lpf   Cast Type Hyaline casts N/A   Mucus, UA Present (A) Not Estab.   Bacteria, UA Moderate (A) None seen/Few  Urinalysis, Complete  Result Value Ref Range   Specific Gravity, UA 1.020 1.005 - 1.030   pH, UA 5.0 5.0 - 7.5   Color, UA Yellow Yellow   Appearance Ur Clear Clear   Leukocytes, UA 1+ (A) Negative   Protein, UA Trace (A) Negative/Trace   Glucose, UA Negative Negative   Ketones, UA 1+ (A) Negative   RBC, UA 2+ (A) Negative   Bilirubin, UA Negative Negative   Urobilinogen, Ur 0.2 0.2 - 1.0 mg/dL   Nitrite, UA Negative Negative   Microscopic Examination See below:     Pertinent Imaging:  Assessment & Plan:  1. Malignant neoplasm of overlapping sites of bladder ([place])-  UA today highly suspicious for infection though patient is asymptomatic. Her UA was similar at her last visit and her culture returned with minimal bacterial growth assist skin consistent with vaginal contamination.  BCG was instilled today without difficulty. Foley catheter was placed and balloon was blown up without difficulty. Catheter was then plugged and patient remained in the office for 2 hours. She then was able to demonstrate de-inflation of the balloon and removal of the catheter herself. She will be able to do this at home with further installations. -BCG education provided and consent signed - Urinalysis, Complete -Urine Culture  No Follow-up on file.  These notes generated with voice recognition software. I apologize for typographical errors.  Herbert Moors, [facility] [address], [place], [place] [address] [phone]

## 2015-07-03 ENCOUNTER — Ambulatory Visit (INDEPENDENT_AMBULATORY_CARE_PROVIDER_SITE_OTHER): Payer: Medicare Other | Admitting: Obstetrics and Gynecology

## 2015-07-03 VITALS — BP 100/62 | HR 79 | Ht <= 58 in | Wt 114.2 lb

## 2015-07-03 DIAGNOSIS — C679 Malignant neoplasm of bladder, unspecified: Secondary | ICD-10-CM | POA: Diagnosis not present

## 2015-07-03 LAB — URINALYSIS, COMPLETE
Bilirubin, UA: NEGATIVE
Glucose, UA: NEGATIVE
Nitrite, UA: NEGATIVE
Specific Gravity, UA: 1.02 (ref 1.005–1.030)
Urobilinogen, Ur: 0.2 mg/dL (ref 0.2–1.0)
pH, UA: 5.5 (ref 5.0–7.5)

## 2015-07-03 LAB — MICROSCOPIC EXAMINATION
Epithelial Cells (non renal): NONE SEEN /hpf (ref 0–10)
RBC, UA: NONE SEEN /hpf (ref 0–?)
WBC, UA: >30 /hpf — ABNORMAL HIGH (ref 0–?)

## 2015-07-03 MED ORDER — BCG LIVE 50 MG IS SUSR
3.2400 mL | Freq: Once | INTRAVESICAL | Status: AC
  Administered 2015-07-03: 81 mg via INTRAVESICAL

## 2015-07-03 MED ORDER — LIDOCAINE HCL 2 % EX GEL
1.0000 "application " | Freq: Once | CUTANEOUS | Status: AC
  Administered 2015-07-03: 1 via URETHRAL

## 2015-07-03 MED ORDER — SODIUM CHLORIDE 0.9% FLUSH: 50.0000 mL | Freq: Once | INTRAVENOUS | Status: DC

## 2015-07-03 NOTE — Progress Notes (Signed)
1:37 PM   JANILL FUSCO [birth year] [id number]  Referring provider: Tracie Harrier, MD [address] [facility] [place], [place] [address]  Chief Complaint  Patient presents with  . Bladder Cancer    BCG treatment #3    HPI: Patient is 82 year old female with a history of CIS bladder cancer. Previously a patient of Dr. Bernardo Heater at [facility]. She was diagnosed with high-grade TA TCC of the bladder in 2000 11. She underwent induction BCG 6 and briefly on maintenance. She then had an abnormal urine cytology on September 2014 which was consistent with CIS. She then underwent induction with only 4 treatments but did not complete the full course due to a nationwide backorder of the medication. Prior to today, her last cystoscopy was 03/2014.  She underwent cystoscopy by Dr. Erlene Quan on 05/06/15 demonstrating erythematous velvety patchesof erythema involving the posterior and left lateral wall of the bladder highly concerning for CIS.  05/20/15 cystoscopy, bilateral retrograde pyelogram and bladder biopsy x 6 performed by Dr. Erlene Quan. 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  She presents today for first (2 of 6) installation of induction series of BCG treatments. She denies any changes in urinary symptoms or fevers. Patient reports that she has significant difficulty controlling her bladder. She is taken multiple medications in the past without improvement in her symptoms.  Foley catheter was placed at her last visit to help patient retain the BCG installation. Patient reports that it worked well. She is experiencing some mild dysuria since her last installation. She denies any fevers or flank pain. No nausea or vomiting.  PMH: Past Medical History  Diagnosis Date  . Degenerative joint disease     managed by the [facility]  . Hypertension   . Hyperlipidemia   . MRSA (methicillin resistant Staphylococcus aureus) 2008    perineal abscess  . Spinal stenosis   .  DCIS (ductal carcinoma in situ) of breast 2007    Right, s/p lumpectomy/xrt   . Cerebrovascular disease 2008    s/p PTCA/stent of MCA, Deveshwar  . History of MRSA infection   . Stroke ([place])   . Arthritis   . GERD (gastroesophageal reflux disease)   . Urge incontinence   . Nocturia   . Thyroid disease   . DDD (degenerative disc disease), cervical   . Incomplete bladder emptying   . Adnexal cyst   . Chronic kidney disease   . History of hiatal hernia   . Headache   . Bladder cancer ([place])   . 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 Breast cancer [facility]) 2007    Right Breast Lumpectomy  . History of TIA (transient ischemic attack)   . Hx MRSA infection   . Brain aneurysm     small, followed with regular angiograms, Dr. Estanislado Pandy    Surgical History: Past Surgical History  Procedure Laterality Date  . Cholecystectomy    . Colonoscopy  2013  . Hemorrhoid surgery  02/01/13  . Cataract extraction    . Tonsillectomy    . Breast lumpectomy    . Laparoscopic hysterectomy    . Abdominal hysterectomy    . Back surgery    . Fracture surgery Left     Ankle, compound fracture  . Eye surgery Bilateral     Cataract Extraction  . Bladder surgery  2012, 2014  . Brain surgery Right 2008    stent, [facility] Pontes, [place]  . Cystoscopy with biopsy N/A 05/20/2015  Procedure: CYSTOSCOPY WITH BIOPSY;  Surgeon: Hollice Espy, MD;  Location: [facility] ORS;  Service: Urology;  Laterality: N/A;  . Cystoscopy w/ retrogrades Bilateral 05/20/2015    Procedure: CYSTOSCOPY WITH RETROGRADE PYELOGRAM;  Surgeon: Hollice Espy, MD;  Location: [facility] ORS;  Service: Urology;  Laterality: Bilateral;    Home Medications:    Medication List       This list is accurate as of: 07/03/15 11:59 PM.  Always use your most recent med list.               amLODipine 10 MG tablet  Commonly known as:  NORVASC  Take 1 tablet (10 mg total) by mouth daily.     aspirin 81 MG tablet  Take 81  mg by mouth daily.     atorvastatin 80 MG tablet  Commonly known as:  LIPITOR  Take 80 mg by mouth daily at 6 PM.     econazole nitrate 1 % cream  Apply topically.     ferrous sulfate 325 (65 FE) MG EC tablet  Take by mouth.     gabapentin 100 MG capsule  Commonly known as:  NEURONTIN  Take 200 mg by mouth every morning. Take 400 mg at bedtime     HYDROcodone-acetaminophen 7.5-325 MG tablet  Commonly known as:  NORCO  Take 1 tablet by mouth 3 (three) times daily as needed for moderate pain.     levothyroxine 50 MCG tablet  Commonly known as:  SYNTHROID, LEVOTHROID  Take 25-50 mcg by mouth See admin instructions. Take one tablet by mouth daily except on sundays take one and one half by mouth. Per Med record     lisinopril 40 MG tablet  Commonly known as:  PRINIVIL,ZESTRIL  Take 1 tablet (40 mg total) by mouth daily.     multivitamin tablet  Take 1 tablet by mouth daily.     oxybutynin 5 MG tablet  Commonly known as:  DITROPAN     phenazopyridine 200 MG tablet  Commonly known as:  PYRIDIUM  Take 1 tablet (200 mg total) by mouth 3 (three) times daily as needed for pain.     PROTONIX 40 MG tablet  Generic drug:  pantoprazole  Take 40 mg by mouth 2 (two) times daily.     tiZANidine 2 MG tablet  Commonly known as:  ZANAFLEX  Take 2 mg by mouth at bedtime.     topiramate 25 MG tablet  Commonly known as:  TOPAMAX  Take 25 mg by mouth daily.     Venlafaxine HCl 150 MG Tb24  Take 1 tablet by mouth daily.        Allergies:  Allergies  Allergen Reactions  . Prednisone     Patient reported this date that she was allergic to PCN, rather than Prednisone.  Patient reported this date that she was allergic to PCN, rather than Prednisone.   Sarina Ill [Bactrim] Itching  . Sulfasalazine Swelling    "swelling of face"  . Penicillins Rash    Rash Rash  . Sulfa Antibiotics Rash  . Sulfamethoxazole-Trimethoprim Rash    Family History: Family History  Problem Relation  Age of Onset  . Fibromyalgia Daughter     Social History:  reports that she has never smoked. She has never used smokeless tobacco. She reports that she does not drink alcohol or use illicit drugs.  ROS: UROLOGY Frequent Urination?: Yes Hard to postpone urination?: No Burning/pain with urination?: Yes Get up at night to urinate?: Yes Leakage  of urine?: No Urine stream starts and stops?: No Trouble starting stream?: No Do you have to strain to urinate?: No Blood in urine?: No Urinary tract infection?: No Sexually transmitted disease?: No Injury to kidneys or bladder?: No Painful intercourse?: No Weak stream?: No Currently pregnant?: No Vaginal bleeding?: No Last menstrual period?: n  Gastrointestinal Nausea?: No Vomiting?: No Indigestion/heartburn?: No Diarrhea?: No Constipation?: No  Constitutional Fever: No Night sweats?: No Weight loss?: No Fatigue?: No  Skin Skin rash/lesions?: No Itching?: No  Eyes Blurred vision?: No Double vision?: No  Ears/Nose/Throat Sore throat?: No Sinus problems?: No  Hematologic/Lymphatic Swollen glands?: No Easy bruising?: No  Cardiovascular Leg swelling?: No Chest pain?: No  Respiratory Cough?: No Shortness of breath?: No  Endocrine Excessive thirst?: No  Musculoskeletal Back pain?: No Joint pain?: Yes  Neurological Headaches?: No Dizziness?: No  Psychologic Depression?: No Anxiety?: No  Physical Exam: BP 100/62 mmHg  Pulse 79  Ht 4\' 9"  (1.448 m)  Wt 114 lb 3.2 oz (51.801 kg)  BMI 24.71 kg/m2  Constitutional:  Alert and oriented, No acute distress. HEENT: [place] AT, moist mucus membranes.  Trachea midline, no masses. Cardiovascular: No clubbing, cyanosis, or edema. Respiratory: Normal respiratory effort, no increased work of breathing. Skin: No rashes, bruises or suspicious lesions. Neurologic: Grossly intact, no focal deficits, moving all 4 extremities. Psychiatric: Normal mood and  affect.  Laboratory Data:   Urinalysis Results for orders placed or performed in visit on 07/03/15  Microscopic Examination  Result Value Ref Range   WBC, UA >30 (H) 0 -  5 /hpf   RBC, UA None seen 0 -  2 /hpf   Epithelial Cells (non renal) None seen 0 - 10 /hpf   Mucus, UA Present (A) Not Estab.   Bacteria, UA Many (A) None seen/Few  Urinalysis, Complete  Result Value Ref Range   Specific Gravity, UA 1.020 1.005 - 1.030   pH, UA 5.5 5.0 - 7.5   Color, UA Yellow Yellow   Appearance Ur Clear Clear   Leukocytes, UA 1+ (A) Negative   Protein, UA 3+ (A) Negative/Trace   Glucose, UA Negative Negative   Ketones, UA Trace (A) Negative   RBC, UA 1+ (A) Negative   Bilirubin, UA Negative Negative   Urobilinogen, Ur 0.2 0.2 - 1.0 mg/dL   Nitrite, UA Negative Negative   Microscopic Examination See below:     Pertinent Imaging:  Assessment & Plan:  1. Malignant neoplasm of overlapping sites of bladder ([place])-  UA today highly suspicious for infection though patient is asymptomatic. Her UA was similar at her last visit and her culture returned with minimal bacterial growth assist skin consistent with vaginal contamination.  BCG was instilled today without difficulty. Foley catheter was placed and balloon was blown up without difficulty. Catheter was then plugged. Patient was instructed at her last visit to probably remove the Foley catheter and disposed of waist in the biochemical waste bag we've provided her. She will return next week for her third [facility] - Urinalysis, Complete   Return in about 1 week (around 07/10/2015) for 3rd BCG.  These notes generated with voice recognition software. I apologize for typographical errors.  Herbert Moors, [facility] [address] [place], [place] [address] [phone]

## 2015-07-03 NOTE — Progress Notes (Signed)
BCG Bladder Instillation  BCG # 2  Due to Bladder Cancer patient is present today for a BCG treatment. Patient was cleaned and prepped in a sterile fashion with betadine and lidocaine 2% jelly was instilled into the urethra.  A 14FR foley catheter was inserted and 10cc of sterile water was inflated into the balloon, urine return was noted 26ml, urine was yellow in color.  42ml of reconstituted BCG was instilled into the bladder. The catheter was then removed. Patient tolerated well, complications were noted as: bladder spasm.  Foley cath was plugged and patient was given proper instruction on removal and disposal after keeping solution in her bladder for two hours.   Preformed by: Fonnie Jarvis, CMA, Herbert Moors, NP  Follow up/ Additional notes: patient was sent home with 10cc syringe and red biohazard bag for foley removal and disposal, follow up next week for BCG #3

## 2015-07-07 ENCOUNTER — Telehealth: Payer: Self-pay | Admitting: Obstetrics and Gynecology

## 2015-07-07 ENCOUNTER — Encounter: Payer: Self-pay | Admitting: Obstetrics and Gynecology

## 2015-07-07 NOTE — Telephone Encounter (Signed)
Pt called, having trouble voiding, burning, not able to urinate much when she goes.  This has been going on since she was seen last week.  Please call patient.

## 2015-07-07 NOTE — Telephone Encounter (Signed)
Spoke with Jodi Colon in reference to possible UTI. Offered Jodi Colon an appt today. Jodi Colon denied appt today and stated she would prefer to come tomorrow. Jodi Colon will come in the morning to see Ria Comment @ [phone].

## 2015-07-08 ENCOUNTER — Encounter: Payer: Self-pay | Admitting: Obstetrics and Gynecology

## 2015-07-08 ENCOUNTER — Ambulatory Visit (INDEPENDENT_AMBULATORY_CARE_PROVIDER_SITE_OTHER): Payer: Medicare Other | Admitting: Obstetrics and Gynecology

## 2015-07-08 VITALS — BP 117/64 | HR 80 | Temp 97.7°F | Resp 16 | Ht <= 58 in | Wt 113.3 lb

## 2015-07-08 DIAGNOSIS — R300 Dysuria: Secondary | ICD-10-CM | POA: Diagnosis not present

## 2015-07-08 LAB — MICROSCOPIC EXAMINATION: Epithelial Cells (non renal): NONE SEEN /hpf (ref 0–10)

## 2015-07-08 LAB — URINALYSIS, COMPLETE
Bilirubin, UA: NEGATIVE
Nitrite, UA: NEGATIVE
Specific Gravity, UA: 1.025 (ref 1.005–1.030)
Urobilinogen, Ur: 0.2 mg/dL (ref 0.2–1.0)
pH, UA: 6.5 (ref 5.0–7.5)

## 2015-07-08 LAB — BLADDER SCAN AMB NON-IMAGING: Scan Result: 21

## 2015-07-08 NOTE — Addendum Note (Signed)
Addended by: Bettye Boeck on: 07/08/2015 04:17 PM   Modules accepted: Orders

## 2015-07-08 NOTE — Progress Notes (Signed)
9:35 AM   Jodi Colon [birth year] [id number]  Referring provider: Tracie Harrier, MD [address] [facility] [place], [place] [address]  Chief Complaint  Patient presents with  . Dysuria    HPI: Patient is 82 year old female with a history of CIS bladder cancer. Previously a patient of Dr. Bernardo Heater at [facility]. She was diagnosed with high-grade TA TCC of the bladder in 2000 11. She underwent induction BCG 6 and briefly on maintenance. She then had an abnormal urine cytology on September 2014 which was consistent with CIS. She then underwent induction with only 4 treatments but did not complete the full course due to a nationwide backorder of the medication. Prior to today, her last cystoscopy was 03/2014.  She underwent cystoscopy by Dr. Erlene Quan on 05/06/15 demonstrating erythematous velvety patchesof erythema involving the posterior and left lateral wall of the bladder highly concerning for CIS.  05/20/15 cystoscopy, bilateral retrograde pyelogram and bladder biopsy x 6 performed by Dr. Erlene Quan. 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  Interval History 07/08/15 Patient reports increase in nocturia with bladder spasms.  Has taken oxybutin in the past but experienced side effects of weakness. Has been taking AZO as needed. Drinking 4-5 glasses of water per day.  She is concerned that she may have a UTI.  BCG scheduled for Thursday.  PMH: Past Medical History  Diagnosis Date  . Degenerative joint disease     [facility]  . Hypertension   . Hyperlipidemia   . MRSA (methicillin resistant Staphylococcus aureus) 2008    perineal abscess  . Spinal stenosis   . DCIS (ductal carcinoma in situ) of breast 2007    Right, s/p lumpectomy/xrt   . Cerebrovascular disease 2008    s/p PTCA/stent of MCA, Deveshwar  . History of MRSA infection   . Stroke ([place])   . Arthritis   . GERD (gastroesophageal reflux disease)   . Urge incontinence   .  Nocturia   . Thyroid disease   . DDD (degenerative disc disease), cervical   . Incomplete bladder emptying   . Adnexal cyst   . Chronic kidney disease   . History of hiatal hernia   . Headache   . Bladder cancer ([place])   . 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 ([place])   . Breast cancer [facility]) 2007    Right Breast Lumpectomy  . History of TIA (transient ischemic attack)   . Hx MRSA infection   . Brain aneurysm     small, followed with regular angiograms, Dr. Estanislado Pandy    Surgical History: Past Surgical History  Procedure Laterality Date  . Cholecystectomy    . Colonoscopy  2013  . Hemorrhoid surgery  02/01/13  . Cataract extraction    . Tonsillectomy    . Breast lumpectomy    . Laparoscopic hysterectomy    . Abdominal hysterectomy    . Back surgery    . Fracture surgery Left     Ankle, compound fracture  . Eye surgery Bilateral     Cataract Extraction  . Bladder surgery  2012, 2014  . Brain surgery Right 2008    stent, Zacarias Pontes, [place]  . Cystoscopy with biopsy N/A 05/20/2015    Procedure: CYSTOSCOPY WITH BIOPSY;  Surgeon: Hollice Espy, MD;  Location: [facility] ORS;  Service: Urology;  Laterality: N/A;  . Cystoscopy w/ retrogrades Bilateral 05/20/2015    Procedure: CYSTOSCOPY WITH RETROGRADE PYELOGRAM;  Surgeon: Hollice Espy, MD;  Location: [facility]  ORS;  Service: Urology;  Laterality: Bilateral;    Home Medications:    Medication List       This list is accurate as of: 07/08/15  9:35 AM.  Always use your most recent med list.               amLODipine 10 MG tablet  Commonly known as:  NORVASC  Take 1 tablet (10 mg total) by mouth daily.     aspirin 81 MG tablet  Take 81 mg by mouth daily.     atorvastatin 80 MG tablet  Commonly known as:  LIPITOR  Take 80 mg by mouth daily at 6 PM.     econazole nitrate 1 % cream  Apply topically.     ferrous sulfate 325 (65 FE) MG EC tablet  Take by mouth.     gabapentin 100 MG capsule  Commonly  known as:  NEURONTIN  Take 200 mg by mouth every morning. Take 400 mg at bedtime     HYDROcodone-acetaminophen 7.5-325 MG tablet  Commonly known as:  NORCO  Take 1 tablet by mouth 3 (three) times daily as needed for moderate pain.     levothyroxine 50 MCG tablet  Commonly known as:  SYNTHROID, LEVOTHROID  Take 25-50 mcg by mouth See admin instructions. Take one tablet by mouth daily except on sundays take one and one half by mouth. Per Med record     lisinopril 40 MG tablet  Commonly known as:  PRINIVIL,ZESTRIL  Take 1 tablet (40 mg total) by mouth daily.     multivitamin tablet  Take 1 tablet by mouth daily.     oxybutynin 5 MG tablet  Commonly known as:  DITROPAN  Reported on 07/08/2015     PROTONIX 40 MG tablet  Generic drug:  pantoprazole  Take 40 mg by mouth 2 (two) times daily.     tiZANidine 2 MG tablet  Commonly known as:  ZANAFLEX  Take 2 mg by mouth at bedtime.     topiramate 25 MG tablet  Commonly known as:  TOPAMAX  Take 25 mg by mouth daily.     Venlafaxine HCl 150 MG Tb24  Take 1 tablet by mouth daily.        Allergies:  Allergies  Allergen Reactions  . Prednisone     Patient reported this date that she was allergic to PCN, rather than Prednisone.  Patient reported this date that she was allergic to PCN, rather than Prednisone.   Sarina Ill [Bactrim] Itching  . Sulfasalazine Swelling    "swelling of face"  . Penicillins Rash    Rash Rash  . Sulfa Antibiotics Rash  . Sulfamethoxazole-Trimethoprim Rash    Family History: Family History  Problem Relation Age of Onset  . Fibromyalgia Daughter     Social History:  reports that she has never smoked. She has never used smokeless tobacco. She reports that she does not drink alcohol or use illicit drugs.  ROS: UROLOGY Frequent Urination?: Yes Hard to postpone urination?: No Burning/pain with urination?: Yes Get up at night to urinate?: Yes Leakage of urine?: Yes Urine stream starts and stops?:  No Trouble starting stream?: No Do you have to strain to urinate?: No Blood in urine?: No Urinary tract infection?: No Sexually transmitted disease?: No Injury to kidneys or bladder?: No Painful intercourse?: No Weak stream?: No Currently pregnant?: No Vaginal bleeding?: No Last menstrual period?: n  Gastrointestinal Nausea?: No Vomiting?: No Indigestion/heartburn?: No Diarrhea?: No Constipation?: No  Constitutional Fever: No Night sweats?: No Weight loss?: No Fatigue?: No  Skin Skin rash/lesions?: No Itching?: No  Eyes Blurred vision?: No Double vision?: No  Ears/Nose/Throat Sore throat?: No Sinus problems?: No  Hematologic/Lymphatic Swollen glands?: No Easy bruising?: No  Cardiovascular Leg swelling?: No Chest pain?: No  Respiratory Cough?: No Shortness of breath?: No  Endocrine Excessive thirst?: No  Musculoskeletal Back pain?: No Joint pain?: No  Neurological Headaches?: No Dizziness?: No  Psychologic Depression?: No Anxiety?: No  Physical Exam: BP 117/64 mmHg  Pulse 80  Temp(Src) 97.7 F (36.5 C)  Resp 16  Ht 4\' 9"  (1.448 m)  Wt 113 lb 4.8 oz (51.393 kg)  BMI 24.51 kg/m2  Constitutional:  Alert and oriented, No acute distress. HEENT: [place] AT, moist mucus membranes.  Trachea midline, no masses. Cardiovascular: No clubbing, cyanosis, or edema. Respiratory: Normal respiratory effort, no increased work of breathing. Skin: No rashes, bruises or suspicious lesions. Neurologic: Grossly intact, no focal deficits, moving all 4 extremities. Psychiatric: Normal mood and affect.  Laboratory Data:   Urinalysis Results for orders placed or performed in visit on 07/08/15  BLADDER SCAN AMB NON-IMAGING  Result Value Ref Range   Scan Result 21 mL     Pertinent Imaging:  Assessment & Plan:  1. Malignant neoplasm of overlapping sites of bladder ([place])-  UA today highly suspicious.  Specimen sent for culture.  Patient has tried numerous  other anticholinergics as well as Myrbetriq.  Continue AZO OTC.   Encouraged increased fluid intake.   Will call with culture results.  - Urinalysis, Complete -Urine Culture  Return for as scheduled for BCG treatment.  These notes generated with voice recognition software. I apologize for typographical errors.  Herbert Moors, [facility] [address] [place], [place] [address] [phone]

## 2015-07-10 ENCOUNTER — Encounter: Payer: Self-pay | Admitting: Obstetrics and Gynecology

## 2015-07-10 ENCOUNTER — Ambulatory Visit: Payer: Medicare Other | Admitting: Obstetrics and Gynecology

## 2015-07-10 ENCOUNTER — Ambulatory Visit (INDEPENDENT_AMBULATORY_CARE_PROVIDER_SITE_OTHER): Payer: Medicare Other | Admitting: Obstetrics and Gynecology

## 2015-07-10 VITALS — BP 119/68 | HR 79 | Ht <= 58 in | Wt 113.0 lb

## 2015-07-10 DIAGNOSIS — N3289 Other specified disorders of bladder: Secondary | ICD-10-CM | POA: Diagnosis not present

## 2015-07-10 DIAGNOSIS — Z87448 Personal history of other diseases of urinary system: Secondary | ICD-10-CM | POA: Diagnosis not present

## 2015-07-10 DIAGNOSIS — R350 Frequency of micturition: Secondary | ICD-10-CM

## 2015-07-10 DIAGNOSIS — N390 Urinary tract infection, site not specified: Secondary | ICD-10-CM

## 2015-07-10 NOTE — Progress Notes (Signed)
2:42 PM   Jodi Colon 1929/09/25 [id number]  Referring provider: Tracie Harrier, MD [address] [facility] [place], [place] [address]  Chief Complaint  Patient presents with  . BCG    HPI: Patient is 82 year old female with a history of CIS bladder cancer. Previously a patient of Dr. Bernardo Heater at [facility]. She was diagnosed with high-grade TA TCC of the bladder in 2000 11. She underwent induction BCG 6 and briefly on maintenance. She then had an abnormal urine cytology on September 2014 which was consistent with CIS. She then underwent induction with only 4 treatments but did not complete the full course due to a nationwide backorder of the medication. Prior to today, her last cystoscopy was 03/2014.  She underwent cystoscopy by Dr. Erlene Quan on 05/06/15 demonstrating erythematous velvety patchesof erythema involving the posterior and left lateral wall of the bladder highly concerning for CIS.  05/20/15 cystoscopy, bilateral retrograde pyelogram and bladder biopsy x 6 performed by Dr. Erlene Quan. 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  Interval History 07/08/15 Patient reports increase in nocturia with bladder spasms.  Has taken oxybutin in the past but experienced side effects of weakness. Has been taking AZO as needed. Drinking 4-5 glasses of water per day.  She is concerned that she may have a UTI.  BCG scheduled for today but urine culture was not sent at her last visit in error.   We will send it today.  PMH: Past Medical History  Diagnosis Date  . Degenerative joint disease     [facility] by [facility]  . Hypertension   . Hyperlipidemia   . MRSA (methicillin resistant Staphylococcus aureus) 2008    perineal abscess  . Spinal stenosis   . DCIS (ductal carcinoma in situ) of breast 2007    Right, s/p lumpectomy/xrt   . Cerebrovascular disease 2008    s/p PTCA/stent of MCA, Deveshwar  . History of MRSA infection   . Stroke ([place])   . Arthritis    . GERD (gastroesophageal reflux disease)   . Urge incontinence   . Nocturia   . Thyroid disease   . DDD (degenerative disc disease), cervical   . Incomplete bladder emptying   . Adnexal cyst   . Chronic kidney disease   . History of hiatal hernia   . Headache   . Bladder cancer ([place])   . 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 Breast cancer [facility]) 2007    Right Breast Lumpectomy  . History of TIA (transient ischemic attack)   . Hx MRSA infection   . Brain aneurysm     small, followed with regular angiograms, Dr. Estanislado Pandy    Surgical History: Past Surgical History  Procedure Laterality Date  . Cholecystectomy    . Colonoscopy  2013  . Hemorrhoid surgery  02/01/13  . Cataract extraction    . Tonsillectomy    . Breast lumpectomy    . Laparoscopic hysterectomy    . Abdominal hysterectomy    . Back surgery    . Fracture surgery Left     Ankle, compound fracture  . Eye surgery Bilateral     Cataract Extraction  . Bladder surgery  2012, 2014  . Brain surgery Right 2008    stent, Zacarias Pontes, [place]  . Cystoscopy with biopsy N/A 05/20/2015    Procedure: CYSTOSCOPY WITH BIOPSY;  Surgeon: Hollice Espy, MD;  Location: [facility] ORS;  Service: Urology;  Laterality: N/A;  . Cystoscopy w/  retrogrades Bilateral 05/20/2015    Procedure: CYSTOSCOPY WITH RETROGRADE PYELOGRAM;  Surgeon: Hollice Espy, MD;  Location: [facility] ORS;  Service: Urology;  Laterality: Bilateral;    Home Medications:    Medication List       This list is accurate as of: 07/10/15  2:42 PM.  Always use your most recent med list.               amLODipine 10 MG tablet  Commonly known as:  NORVASC  Take 1 tablet (10 mg total) by mouth daily.     aspirin 81 MG tablet  Take 81 mg by mouth daily.     atorvastatin 80 MG tablet  Commonly known as:  LIPITOR  Take 80 mg by mouth daily at 6 PM.     econazole nitrate 1 % cream  Apply topically.     ferrous sulfate 325 (65 FE) MG EC  tablet  Take by mouth.     gabapentin 100 MG capsule  Commonly known as:  NEURONTIN  Take 200 mg by mouth every morning. Take 400 mg at bedtime     HYDROcodone-acetaminophen 7.5-325 MG tablet  Commonly known as:  NORCO  Take 1 tablet by mouth 3 (three) times daily as needed for moderate pain.     levothyroxine 50 MCG tablet  Commonly known as:  SYNTHROID, LEVOTHROID  Take 25-50 mcg by mouth See admin instructions. Take one tablet by mouth daily except on sundays take one and one half by mouth. Per Med record     lisinopril 40 MG tablet  Commonly known as:  PRINIVIL,ZESTRIL  Take 1 tablet (40 mg total) by mouth daily.     multivitamin tablet  Take 1 tablet by mouth daily.     oxybutynin 5 MG tablet  Commonly known as:  DITROPAN  Reported on 07/08/2015     PROTONIX 40 MG tablet  Generic drug:  pantoprazole  Take 40 mg by mouth 2 (two) times daily.     tiZANidine 2 MG tablet  Commonly known as:  ZANAFLEX  Take 2 mg by mouth at bedtime.     topiramate 25 MG tablet  Commonly known as:  TOPAMAX  Take 25 mg by mouth daily.     Venlafaxine HCl 150 MG Tb24  Take 1 tablet by mouth daily.        Allergies:  Allergies  Allergen Reactions  . Prednisone     Patient reported this date that she was allergic to PCN, rather than Prednisone.  Patient reported this date that she was allergic to PCN, rather than Prednisone.   Sarina Ill [Bactrim] Itching  . Sulfasalazine Swelling    "swelling of face"  . Penicillins Rash    Rash Rash  . Sulfa Antibiotics Rash  . Sulfamethoxazole-Trimethoprim Rash    Family History: Family History  Problem Relation Age of Onset  . Fibromyalgia Daughter     Social History:  reports that she has never smoked. She has never used smokeless tobacco. She reports that she does not drink alcohol or use illicit drugs.  ROS: UROLOGY Frequent Urination?: Yes Hard to postpone urination?: No Burning/pain with urination?: Yes Get up at night to  urinate?: Yes Leakage of urine?: Yes Urine stream starts and stops?: No Trouble starting stream?: Yes Do you have to strain to urinate?: Yes Blood in urine?: No Urinary tract infection?: No Sexually transmitted disease?: No Injury to kidneys or bladder?: No Painful intercourse?: No Weak stream?: No Currently pregnant?: No Vaginal  bleeding?: No Last menstrual period?: n  Gastrointestinal Nausea?: Yes Vomiting?: No Indigestion/heartburn?: Yes Diarrhea?: Yes Constipation?: No  Constitutional Fever: No Night sweats?: No Weight loss?: Yes Fatigue?: Yes  Skin Skin rash/lesions?: No Itching?: No  Eyes Blurred vision?: No Double vision?: No  Ears/Nose/Throat Sore throat?: No Sinus problems?: No  Hematologic/Lymphatic Swollen glands?: No Easy bruising?: No  Cardiovascular Leg swelling?: No Chest pain?: No  Respiratory Cough?: No Shortness of breath?: No  Endocrine Excessive thirst?: No  Musculoskeletal Back pain?: No Joint pain?: Yes  Neurological Headaches?: No Dizziness?: No  Psychologic Depression?: No Anxiety?: No  Physical Exam: BP 119/68 mmHg  Pulse 79  Ht 4\' 9"  (1.448 m)  Wt 113 lb (51.256 kg)  BMI 24.45 kg/m2  Constitutional:  Alert and oriented, No acute distress. HEENT: [place] AT, moist mucus membranes.  Trachea midline, no masses. Cardiovascular: No clubbing, cyanosis, or edema. Respiratory: Normal respiratory effort, no increased work of breathing. Skin: No rashes, bruises or suspicious lesions. Neurologic: Grossly intact, no focal deficits, moving all 4 extremities. Psychiatric: Normal mood and affect.  Laboratory Data:   Urinalysis Results for orders placed or performed in visit on 07/08/15  Microscopic Examination  Result Value Ref Range   WBC, UA >30W 0 -  5 /hpf   RBC, UA 11-30 (A) 0 -  2 /hpf   Epithelial Cells (non renal) None seen 0 - 10 /hpf   Bacteria, UA Few None seen/Few  Urinalysis, Complete  Result Value Ref  Range   Specific Gravity, UA 1.025 1.005 - 1.030   pH, UA 6.5 5.0 - 7.5   Color, UA Yellow Yellow   Appearance Ur Cloudy (A) Clear   Leukocytes, UA Trace (A) Negative   Protein, UA 3+ (A) Negative/Trace   Glucose, UA Trace (A) Negative   Ketones, UA Trace (A) Negative   RBC, UA 3+ (A) Negative   Bilirubin, UA Negative Negative   Urobilinogen, Ur 0.2 0.2 - 1.0 mg/dL   Nitrite, UA Negative Negative   Microscopic Examination See below:   BLADDER SCAN AMB NON-IMAGING  Result Value Ref Range   Scan Result 21 mL     Pertinent Imaging:  Assessment & Plan:  1. Malignant neoplasm of overlapping sites of bladder ([place])-  UA today highly suspicious.  Specimen sent for culture.  Patient has tried numerous other anticholinergics as well as Myrbetriq.  Continue AZO OTC.   Encouraged increased fluid intake.   An error was made and urine culture was not sent at her last visit.   Will start macrobid while awaiting culture results. - Urinalysis, Complete -Urine Culture  2. Urinary Frequency/bladder pressure- as above   Return for Monday BCG.  These notes generated with voice recognition software. I apologize for typographical errors.  Herbert Moors, [facility] [address] [place], [place] [address] [phone]

## 2015-07-11 ENCOUNTER — Telehealth: Payer: Self-pay | Admitting: Obstetrics and Gynecology

## 2015-07-11 MED ORDER — NITROFURANTOIN MONOHYD MACRO 100 MG PO CAPS: 100.0000 mg | ORAL_CAPSULE | Freq: Two times a day (BID) | ORAL | Status: DC

## 2015-07-11 NOTE — Telephone Encounter (Signed)
LMOM- pick up medication.  Spoke with pt daughter and made aware of macrobid and the need to start it today. Daughter voiced understanding.

## 2015-07-11 NOTE — Telephone Encounter (Signed)
Please call and remind patient to pick up her prescription for Macrobid at her pharmacy.  We will call her with culture results on Monday.

## 2015-07-11 NOTE — Progress Notes (Signed)
error 

## 2015-07-12 LAB — CULTURE, URINE COMPREHENSIVE

## 2015-07-14 ENCOUNTER — Telehealth: Payer: Self-pay

## 2015-07-14 ENCOUNTER — Ambulatory Visit: Payer: Medicare Other | Admitting: Obstetrics and Gynecology

## 2015-07-14 NOTE — Telephone Encounter (Signed)
-----   Message from Roda Shutters, [place] sent at 07/14/2015  8:43 AM EST ----- Please notify patient that her urine culture was negative for infection. I would like her to be rescheduled as soon as possible for her BCG treatment. Thanks

## 2015-07-14 NOTE — Telephone Encounter (Signed)
LMOM-  -ucx. Needs BCG asap.

## 2015-07-17 ENCOUNTER — Encounter: Payer: Self-pay | Admitting: Obstetrics and Gynecology

## 2015-07-17 ENCOUNTER — Ambulatory Visit (INDEPENDENT_AMBULATORY_CARE_PROVIDER_SITE_OTHER): Payer: Medicare Other | Admitting: Obstetrics and Gynecology

## 2015-07-17 VITALS — BP 142/82 | HR 76 | Resp 16 | Ht <= 58 in | Wt 111.9 lb

## 2015-07-17 DIAGNOSIS — C679 Malignant neoplasm of bladder, unspecified: Secondary | ICD-10-CM | POA: Diagnosis not present

## 2015-07-17 LAB — MICROSCOPIC EXAMINATION: WBC, UA: >30 /hpf — ABNORMAL HIGH (ref 0–?)

## 2015-07-17 LAB — URINALYSIS, COMPLETE
Bilirubin, UA: NEGATIVE
Glucose, UA: NEGATIVE
Ketones, UA: NEGATIVE
Nitrite, UA: NEGATIVE
Specific Gravity, UA: 1.015 (ref 1.005–1.030)
Urobilinogen, Ur: 0.2 mg/dL (ref 0.2–1.0)
pH, UA: 7 (ref 5.0–7.5)

## 2015-07-17 MED ORDER — BCG LIVE 50 MG IS SUSR
0.2000 mL | Freq: Once | INTRAVESICAL | Status: AC
  Administered 2015-07-17: 5 mg via INTRAVESICAL

## 2015-07-17 NOTE — Progress Notes (Signed)
3:46 PM   Jodi Colon Dec 23, 1929 [id number]  Referring provider: Tracie Harrier, MD [address] [facility] [place], [place] [address]  Chief Complaint  Patient presents with  . Bladder Cancer  . OTHER    BCG #3    HPI: Patient is 82 year old female with a history of CIS bladder cancer. Previously a patient of Dr. Bernardo Heater at [facility]. She was diagnosed with high-grade TA TCC of the bladder in 2000 11. She underwent induction BCG 6 and briefly on maintenance. She then had an abnormal urine cytology on September 2014 which was consistent with CIS. She then underwent induction with only 4 treatments but did not complete the full course due to a nationwide backorder of the medication. Prior to today, her last cystoscopy was 03/2014.  She underwent cystoscopy by Dr. Erlene Quan on 05/06/15 demonstrating erythematous velvety patchesof erythema involving the posterior and left lateral wall of the bladder highly concerning for CIS.  05/20/15 cystoscopy, bilateral retrograde pyelogram and bladder biopsy x 6 performed by Dr. Erlene Quan. 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  She presents today for first (3 of 6) installation of induction series of BCG treatments. She denies any changes in urinary symptoms or fevers. Patient reports that she has significant difficulty controlling her bladder. She is taken multiple medications in the past without improvement in her symptoms.  Foley catheter was placed at her last visit to help patient retain the BCG installation. She denies any fevers or flank pain. No nausea or vomiting. There was some concern last week that patient may have a urinary tract infection that her culture was negative for infection. She continues to experience irritative voiding symptoms.  PMH: Past Medical History  Diagnosis Date  . Degenerative joint disease     [facility] by the [facility]  . Hypertension   . Hyperlipidemia   . MRSA (methicillin  resistant Staphylococcus aureus) 2008    perineal abscess  . Spinal stenosis   . DCIS (ductal carcinoma in situ) of breast 2007    Right, s/p lumpectomy/xrt   . Cerebrovascular disease 2008    s/p PTCA/stent of MCA, Deveshwar  . History of MRSA infection   . Stroke ([place])   . Arthritis   . GERD (gastroesophageal reflux disease)   . Urge incontinence   . Nocturia   . Thyroid disease   . DDD (degenerative disc disease), cervical   . Incomplete bladder emptying   . Adnexal cyst   . Chronic kidney disease   . History of hiatal hernia   . Headache   . Bladder cancer ([place])   . 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 ([place])   . Breast cancer [facility]) 2007    Right Breast Lumpectomy  . History of TIA (transient ischemic attack)   . Hx MRSA infection   . Brain aneurysm     small, followed with regular angiograms, Dr. Estanislado Pandy    Surgical History: Past Surgical History  Procedure Laterality Date  . Cholecystectomy    . Colonoscopy  2013  . Hemorrhoid surgery  02/01/13  . Cataract extraction    . Tonsillectomy    . Breast lumpectomy    . Laparoscopic hysterectomy    . Abdominal hysterectomy    . Back surgery    . Fracture surgery Left     Ankle, compound fracture  . Eye surgery Bilateral     Cataract Extraction  . Bladder surgery  2012, 2014  . Brain surgery Right 2008  stent, [facility] Pontes, [place]  . Cystoscopy with biopsy N/A 05/20/2015    Procedure: CYSTOSCOPY WITH BIOPSY;  Surgeon: Hollice Espy, MD;  Location: [facility] ORS;  Service: Urology;  Laterality: N/A;  . Cystoscopy w/ retrogrades Bilateral 05/20/2015    Procedure: CYSTOSCOPY WITH RETROGRADE PYELOGRAM;  Surgeon: Hollice Espy, MD;  Location: [facility] ORS;  Service: Urology;  Laterality: Bilateral;    Home Medications:    Medication List       This list is accurate as of: 07/17/15  3:46 PM.  Always use your most recent med list.               amLODipine 10 MG tablet  Commonly known as:   NORVASC  Take 1 tablet (10 mg total) by mouth daily.     aspirin 81 MG tablet  Take 81 mg by mouth daily.     atorvastatin 80 MG tablet  Commonly known as:  LIPITOR  Take 80 mg by mouth daily at 6 PM.     econazole nitrate 1 % cream  Apply topically.     ferrous sulfate 325 (65 FE) MG EC tablet  Take by mouth.     gabapentin 100 MG capsule  Commonly known as:  NEURONTIN  Take 200 mg by mouth every morning. Take 400 mg at bedtime     HYDROcodone-acetaminophen 7.5-325 MG tablet  Commonly known as:  NORCO  Take 1 tablet by mouth 3 (three) times daily as needed for moderate pain.     levothyroxine 50 MCG tablet  Commonly known as:  SYNTHROID, LEVOTHROID  Take 25-50 mcg by mouth See admin instructions. Take one tablet by mouth daily except on sundays take one and one half by mouth. Per Med record     lisinopril 40 MG tablet  Commonly known as:  PRINIVIL,ZESTRIL  Take 1 tablet (40 mg total) by mouth daily.     multivitamin tablet  Take 1 tablet by mouth daily.     nitrofurantoin (macrocrystal-monohydrate) 100 MG capsule  Commonly known as:  MACROBID  Take 1 capsule (100 mg total) by mouth every 12 (twelve) hours.     oxybutynin 5 MG tablet  Commonly known as:  DITROPAN  Reported on 07/08/2015     PROTONIX 40 MG tablet  Generic drug:  pantoprazole  Take 40 mg by mouth 2 (two) times daily.     tiZANidine 2 MG tablet  Commonly known as:  ZANAFLEX  Take 2 mg by mouth at bedtime.     topiramate 25 MG tablet  Commonly known as:  TOPAMAX  Take 25 mg by mouth daily.     Venlafaxine HCl 150 MG Tb24  Take 1 tablet by mouth daily.        Allergies:  Allergies  Allergen Reactions  . Prednisone     Patient reported this date that she was allergic to PCN, rather than Prednisone.  Patient reported this date that she was allergic to PCN, rather than Prednisone.   Sarina Ill [Bactrim] Itching  . Sulfasalazine Swelling    "swelling of face"  . Penicillins Rash    Rash Rash   . Sulfa Antibiotics Rash  . Sulfamethoxazole-Trimethoprim Rash    Family History: Family History  Problem Relation Age of Onset  . Fibromyalgia Daughter     Social History:  reports that she has never smoked. She has never used smokeless tobacco. She reports that she does not drink alcohol or use illicit drugs.  ROS: UROLOGY Frequent Urination?: Yes Hard  to postpone urination?: Yes Burning/pain with urination?: Yes Get up at night to urinate?: Yes Leakage of urine?: Yes Urine stream starts and stops?: No Trouble starting stream?: No Do you have to strain to urinate?: No Blood in urine?: No Urinary tract infection?: No Sexually transmitted disease?: No Injury to kidneys or bladder?: No Painful intercourse?: No Weak stream?: No Currently pregnant?: No Vaginal bleeding?: No Last menstrual period?: n  Gastrointestinal Nausea?: Yes Vomiting?: No Indigestion/heartburn?: Yes Diarrhea?: No Constipation?: No  Constitutional Fever: No Night sweats?: No Weight loss?: No Fatigue?: No  Skin Skin rash/lesions?: No Itching?: No  Eyes Blurred vision?: No Double vision?: No  Ears/Nose/Throat Sore throat?: No Sinus problems?: No  Hematologic/Lymphatic Swollen glands?: No Easy bruising?: No  Cardiovascular Leg swelling?: No Chest pain?: No  Respiratory Cough?: No Shortness of breath?: No  Endocrine Excessive thirst?: No  Musculoskeletal Back pain?: No Joint pain?: No  Neurological Headaches?: No Dizziness?: No  Psychologic Depression?: No Anxiety?: No  Physical Exam: BP 142/82 mmHg  Pulse 76  Resp 16  Ht 4\' 9"  (1.448 m)  Wt 111 lb 14.4 oz (50.758 kg)  BMI 24.21 kg/m2  Constitutional:  Alert and oriented, No acute distress. HEENT: [place] AT, moist mucus membranes.  Trachea midline, no masses. Cardiovascular: No clubbing, cyanosis, or edema. Respiratory: Normal respiratory effort, no increased work of breathing. Skin: No rashes, bruises or  suspicious lesions. Neurologic: Grossly intact, no focal deficits, moving all 4 extremities. Psychiatric: Normal mood and affect.  Laboratory Data:   Urinalysis Results for orders placed or performed in visit on 07/10/15  CULTURE, URINE COMPREHENSIVE  Result Value Ref Range   Urine Culture, Comprehensive Final report (A)    Result 1 Comment (A)     Pertinent Imaging:  Assessment & Plan:  1. Malignant neoplasm of overlapping sites of bladder ([place])-  UA today highly suspicious for infection though patient is asymptomatic. Her UA was similar at her last visit and her culture returned with minimal bacterial growth assist skin consistent with vaginal contamination.  BCG was instilled today without difficulty. Foley catheter was placed and balloon was blown up without difficulty. Catheter was then plugged. Patient was instructed at her last visit to probably remove the Foley catheter and disposed of waist in the biochemical waste bag we've provided her. She will return next week for her 4th BCG - Urinalysis, Complete Per CMA report patient had significant bladder spasms and discomfort during BCG administration. She leaked a significant amount of the fluid out around the catheter after it was placed. She was further provided with 50 mg samples of Myrbetriq to start today prior to her next BCG installation. Hopefully this will help improve her bladder spasms to the point where she can hold her BCG treatments for the full 2 hours. She states that she has tried Myrbetriq in the past and it was not helpful with her overactive bladder symptoms she also has taken Detrol and was concerned that it caused her to fall.  Return in about 1 week (around 07/24/2015) for 4th BCG.  These notes generated with voice recognition software. I apologize for typographical errors.  Herbert Moors, [facility] [address] [place], [place] [address] [phone]

## 2015-07-17 NOTE — Addendum Note (Signed)
Addended by: Bettye Boeck on: 07/17/2015 04:13 PM   Modules accepted: Orders

## 2015-07-17 NOTE — Progress Notes (Signed)
BCG Bladder Instillation  BCG # 3  Due to Bladder Cancer patient is present today for a BCG treatment. Patient was cleaned and prepped in a sterile fashion with betadine and lidocaine 2% jelly was instilled into the urethra.  A 14FR Foley catheter was inserted, urine return was noted 20 ml, urine was yellow  in color.  10 ml of sterile water was used to inflate the catheter balloon. 56ml of reconstituted BCG was instilled into the bladder. The catheter was then plugged. Patient tolerated well, no complications were noted  Performed by: Fonnie Jarvis, CMA and Golden Hurter, CMA   Follow up/ Additional notes:  Pt was instructed on removing the catheter and cautions to be taken.  All materials that have come in contact with the BCG are to placed in a biohazard bag and returned to the clinic for disposal.

## 2015-07-24 ENCOUNTER — Ambulatory Visit (INDEPENDENT_AMBULATORY_CARE_PROVIDER_SITE_OTHER): Payer: Medicare Other | Admitting: Obstetrics and Gynecology

## 2015-07-24 ENCOUNTER — Encounter: Payer: Self-pay | Admitting: Obstetrics and Gynecology

## 2015-07-24 VITALS — BP 109/61 | HR 80 | Temp 97.6°F | Resp 16 | Ht 59.0 in | Wt 114.4 lb

## 2015-07-24 DIAGNOSIS — C679 Malignant neoplasm of bladder, unspecified: Secondary | ICD-10-CM

## 2015-07-24 MED ORDER — BCG LIVE 50 MG IS SUSR
0.2000 mL | Freq: Once | INTRAVESICAL | Status: AC
  Administered 2015-07-24: 5 mg via INTRAVESICAL

## 2015-07-24 MED ORDER — BCG LIVE 50 MG IS SUSR
3.2400 mL | Freq: Once | INTRAVESICAL | Status: AC
  Administered 2015-07-24: 81 mg via INTRAVESICAL

## 2015-07-24 MED ORDER — SODIUM CHLORIDE 0.9% FLUSH: 50.0000 mL | Freq: Once | INTRAVENOUS | Status: DC

## 2015-07-24 NOTE — Progress Notes (Signed)
BCG Bladder Instillation  BCG # 4  Due to Bladder Cancer patient is present today for a BCG treatment. Patient was cleaned and prepped in a sterile fashion with betadine and lidocaine 2% jelly was instilled into the urethra.  A 14FR catheter was inserted, urine return was noted 75ml, urine was yellow in color 10cc of sterile water was instilled into the balloon.  74ml of reconstituted BCG was instilled into the bladder.  Patient tolerated well, complications were noted as: Patient experienced bladder spasms during instillation. Patient's cath was plugged and sent home with syringe and bag for disposal  Preformed by: Herbert Moors, NP and Fonnie Jarvis, CMA  Follow up/ Additional notes: 1 week BCG

## 2015-07-24 NOTE — Progress Notes (Signed)
9:38 AM   Jodi Colon 08-09-1929 [id number]  Referring provider: Tracie Harrier, MD [address] [facility] [place], [place] [address]  Chief Complaint  Patient presents with  . Bladder Cancer    4th BCG    HPI: Patient is 82 year old female with a history of CIS bladder cancer. Previously a patient of Dr. Bernardo Heater at [facility]. She was diagnosed with high-grade TA TCC of the bladder in 2000 11. She underwent induction BCG 6 and briefly on maintenance. She then had an abnormal urine cytology on September 2014 which was consistent with CIS. She then underwent induction with only 4 treatments but did not complete the full course due to a nationwide backorder of the medication. Prior to today, her last cystoscopy was 03/2014.  She underwent cystoscopy by Dr. Erlene Quan on 05/06/15 demonstrating erythematous velvety patchesof erythema involving the posterior and left lateral wall of the bladder highly concerning for CIS.  05/20/15 cystoscopy, bilateral retrograde pyelogram and bladder biopsy x 6 performed by Dr. Erlene Quan. 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  She presents today for first (4 of 6) installation of induction series of BCG treatments. She denies any changes in urinary symptoms or fevers. Patient reports that she has significant difficulty controlling her bladder. She is taken multiple medications in the past without improvement in her symptoms.  Foley catheter was placed at her last visit to help patient retain the BCG installation. She denies any fevers or flank pain. No nausea or vomiting. There was some concern last week that patient may have a urinary tract infection that her culture was negative for infection. She continues to experience irritative voiding symptoms that reports that they are improving since she started Myrbetriq at her last visit.  PMH: Past Medical History  Diagnosis Date  . Degenerative joint disease     managed by the [facility]  . Hypertension   . Hyperlipidemia   . MRSA (methicillin resistant Staphylococcus aureus) 2008    perineal abscess  . Spinal stenosis   . DCIS (ductal carcinoma in situ) of breast 2007    Right, s/p lumpectomy/xrt   . Cerebrovascular disease 2008    s/p PTCA/stent of MCA, Deveshwar  . History of MRSA infection   . Stroke ([place])   . Arthritis   . GERD (gastroesophageal reflux disease)   . Urge incontinence   . Nocturia   . Thyroid disease   . DDD (degenerative disc disease), cervical   . Incomplete bladder emptying   . Adnexal cyst   . Chronic kidney disease   . History of hiatal hernia   . Headache   . Bladder cancer ([place])   . 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 ([place])   . Breast cancer [facility]) 2007    Right Breast Lumpectomy  . History of TIA (transient ischemic attack)   . Hx MRSA infection   . Brain aneurysm     small, followed with regular angiograms, Dr. Estanislado Pandy    Surgical History: Past Surgical History  Procedure Laterality Date  . Cholecystectomy    . Colonoscopy  2013  . Hemorrhoid surgery  02/01/13  . Cataract extraction    . Tonsillectomy    . Breast lumpectomy    . Laparoscopic hysterectomy    . Abdominal hysterectomy    . Back surgery    . Fracture surgery Left     Ankle, compound fracture  . Eye surgery Bilateral     Cataract Extraction  .  Bladder surgery  2012, 2014  . Brain surgery Right 2008    stent, [facility] Pontes, [place]  . Cystoscopy with biopsy N/A 05/20/2015    Procedure: CYSTOSCOPY WITH BIOPSY;  Surgeon: Hollice Espy, MD;  Location: [facility] ORS;  Service: Urology;  Laterality: N/A;  . Cystoscopy w/ retrogrades Bilateral 05/20/2015    Procedure: CYSTOSCOPY WITH RETROGRADE PYELOGRAM;  Surgeon: Hollice Espy, MD;  Location: [facility] ORS;  Service: Urology;  Laterality: Bilateral;    Home Medications:    Medication List       This list is accurate as of: 07/24/15 11:59 PM.  Always use your most recent med list.                amLODipine 10 MG tablet  Commonly known as:  NORVASC  Take 1 tablet (10 mg total) by mouth daily.     aspirin 81 MG tablet  Take 81 mg by mouth daily.     atorvastatin 80 MG tablet  Commonly known as:  LIPITOR  Take 80 mg by mouth daily at 6 PM.     econazole nitrate 1 % cream  Apply topically.     ferrous sulfate 325 (65 FE) MG EC tablet  Take by mouth.     gabapentin 100 MG capsule  Commonly known as:  NEURONTIN  Take 200 mg by mouth every morning. Take 400 mg at bedtime     HYDROcodone-acetaminophen 7.5-325 MG tablet  Commonly known as:  NORCO  Take 1 tablet by mouth 3 (three) times daily as needed for moderate pain.     levothyroxine 50 MCG tablet  Commonly known as:  SYNTHROID, LEVOTHROID  Take 25-50 mcg by mouth See admin instructions. Take one tablet by mouth daily except on sundays take one and one half by mouth. Per Med record     lisinopril 40 MG tablet  Commonly known as:  PRINIVIL,ZESTRIL  Take 1 tablet (40 mg total) by mouth daily.     multivitamin tablet  Take 1 tablet by mouth daily.     oxybutynin 5 MG tablet  Commonly known as:  DITROPAN  Reported on 07/08/2015     PROTONIX 40 MG tablet  Generic drug:  pantoprazole  Take 40 mg by mouth 2 (two) times daily.     tiZANidine 2 MG tablet  Commonly known as:  ZANAFLEX  Take 2 mg by mouth at bedtime.     topiramate 25 MG tablet  Commonly known as:  TOPAMAX  Take 25 mg by mouth daily.     Venlafaxine HCl 150 MG Tb24  Take 1 tablet by mouth daily.        Allergies:  Allergies  Allergen Reactions  . Prednisone     Patient reported this date that she was allergic to PCN, rather than Prednisone.  Patient reported this date that she was allergic to PCN, rather than Prednisone.   Sarina Ill [Bactrim] Itching  . Sulfasalazine Swelling    "swelling of face"  . Penicillins Rash    Rash Rash  . Sulfa Antibiotics Rash  . Sulfamethoxazole-Trimethoprim Rash    Family History: Family  History  Problem Relation Age of Onset  . Fibromyalgia Daughter     Social History:  reports that she has never smoked. She has never used smokeless tobacco. She reports that she does not drink alcohol or use illicit drugs.  ROS: UROLOGY Frequent Urination?: Yes Hard to postpone urination?: Yes Burning/pain with urination?: Yes Get up at night to urinate?: Yes  Leakage of urine?: Yes Urine stream starts and stops?: No Trouble starting stream?: No Do you have to strain to urinate?: Yes Blood in urine?: No Urinary tract infection?: No Sexually transmitted disease?: No Injury to kidneys or bladder?: No Painful intercourse?: No Weak stream?: No Currently pregnant?: No Vaginal bleeding?: No Last menstrual period?: n  Gastrointestinal Nausea?: No Vomiting?: No Indigestion/heartburn?: No Diarrhea?: No Constipation?: No  Constitutional Fever: No Night sweats?: No Weight loss?: Yes Fatigue?: No  Skin Skin rash/lesions?: No Itching?: No  Eyes Blurred vision?: Yes Double vision?: No  Ears/Nose/Throat Sore throat?: No Sinus problems?: No  Hematologic/Lymphatic Swollen glands?: No Easy bruising?: No  Cardiovascular Leg swelling?: No Chest pain?: No  Respiratory Cough?: No Shortness of breath?: No  Endocrine Excessive thirst?: No  Musculoskeletal Back pain?: No Joint pain?: Yes  Neurological Headaches?: No Dizziness?: No  Psychologic Depression?: No Anxiety?: No  Physical Exam: BP 109/61 mmHg  Pulse 80  Temp(Src) 97.6 F (36.4 C)  Resp 16  Ht 4\' 11"  (1.499 m)  Wt 114 lb 6.4 oz (51.891 kg)  BMI 23.09 kg/m2  Constitutional:  Alert and oriented, No acute distress. HEENT: [place] AT, moist mucus membranes.  Trachea midline, no masses. Cardiovascular: No clubbing, cyanosis, or edema. Respiratory: Normal respiratory effort, no increased work of breathing. Skin: No rashes, bruises or suspicious lesions. Neurologic: Grossly intact, no focal deficits,  moving all 4 extremities. Psychiatric: Normal mood and affect.  Laboratory Data:   Urinalysis Results for orders placed or performed in visit on 07/24/15  Microscopic Examination  Result Value Ref Range   WBC, UA 6-10 (A) 0 -  5 /hpf   RBC, UA >30 (A) 0 -  2 /hpf   Epithelial Cells (non renal) 0-10 0 - 10 /hpf   Bacteria, UA None seen None seen/Few  Urinalysis, Complete  Result Value Ref Range   Specific Gravity, UA 1.010 1.005 - 1.030   pH, UA 6.5 5.0 - 7.5   Color, UA Yellow Yellow   Appearance Ur Cloudy (A) Clear   Leukocytes, UA 3+ (A) Negative   Protein, UA 2+ (A) Negative/Trace   Glucose, UA Negative Negative   Ketones, UA Negative Negative   RBC, UA 1+ (A) Negative   Bilirubin, UA Negative Negative   Urobilinogen, Ur 0.2 0.2 - 1.0 mg/dL   Nitrite, UA Negative Negative   Microscopic Examination See below:     Pertinent Imaging:  Assessment & Plan:  1. Malignant neoplasm of overlapping sites of bladder ([place])-  UA today highly suspicious for infection though patient is asymptomatic. Her UA was similar at her last visit and her culture returned with minimal bacterial growth assist skin consistent with vaginal contamination.  BCG was instilled today without difficulty. Foley catheter was placed and balloon was blown up without difficulty. Catheter was then plugged. Patient was instructed at her last visit to probably remove the Foley catheter and disposed of waist in the biochemical waste bag we've provided her. She will return next week for her 5th BCG - Urinalysis, Complete  Return in about 1 week (around 07/31/2015).  These notes generated with voice recognition software. I apologize for typographical errors.  Herbert Moors, [facility] [address] [place], [place] [address] [phone]

## 2015-07-25 LAB — URINALYSIS, COMPLETE
Bilirubin, UA: NEGATIVE
Glucose, UA: NEGATIVE
Ketones, UA: NEGATIVE
Nitrite, UA: NEGATIVE
Specific Gravity, UA: 1.01 (ref 1.005–1.030)
Urobilinogen, Ur: 0.2 mg/dL (ref 0.2–1.0)
pH, UA: 6.5 (ref 5.0–7.5)

## 2015-07-25 LAB — MICROSCOPIC EXAMINATION
Bacteria, UA: NONE SEEN
RBC, UA: >30 /hpf — AB (ref 0–?)

## 2015-07-29 ENCOUNTER — Other Ambulatory Visit (HOSPITAL_COMMUNITY): Payer: Self-pay | Admitting: Interventional Radiology

## 2015-07-29 DIAGNOSIS — I771 Stricture of artery: Secondary | ICD-10-CM

## 2015-07-31 ENCOUNTER — Ambulatory Visit: Payer: Medicare Other | Admitting: Obstetrics and Gynecology

## 2015-08-07 ENCOUNTER — Encounter: Payer: Self-pay | Admitting: Obstetrics and Gynecology

## 2015-08-07 ENCOUNTER — Ambulatory Visit (INDEPENDENT_AMBULATORY_CARE_PROVIDER_SITE_OTHER): Payer: Medicare Other | Admitting: Obstetrics and Gynecology

## 2015-08-07 VITALS — BP 126/71 | HR 73 | Resp 16 | Ht <= 58 in | Wt 112.3 lb

## 2015-08-07 DIAGNOSIS — C679 Malignant neoplasm of bladder, unspecified: Secondary | ICD-10-CM

## 2015-08-07 LAB — URINALYSIS, COMPLETE
Bilirubin, UA: NEGATIVE
Glucose, UA: NEGATIVE
Ketones, UA: NEGATIVE
Nitrite, UA: NEGATIVE
Specific Gravity, UA: 1.025 (ref 1.005–1.030)
Urobilinogen, Ur: 0.2 mg/dL (ref 0.2–1.0)
pH, UA: 7 (ref 5.0–7.5)

## 2015-08-07 LAB — MICROSCOPIC EXAMINATION
Bacteria, UA: NONE SEEN
WBC, UA: >30 /hpf — AB (ref 0–?)

## 2015-08-07 MED ORDER — BCG LIVE 50 MG IS SUSR: 0.2000 mL | Freq: Once | INTRAVESICAL | Status: DC

## 2015-08-07 MED ORDER — BCG LIVE 50 MG IS SUSR
3.2400 mL | Freq: Once | INTRAVESICAL | Status: AC
  Administered 2015-08-07: 81 mg via INTRAVESICAL

## 2015-08-07 NOTE — Progress Notes (Signed)
BCG Bladder Instillation  BCG # 5  Due to Bladder Cancer patient is present today for a BCG treatment. Patient was cleaned and prepped in a sterile fashion with betadine and lidocaine 2% jelly was instilled into the urethra. A 14FR catheter was inserted, urine return was noted 63ml, urine was yellow in color 10cc of sterile water was instilled into the balloon. 11ml of reconstituted BCG was instilled into the bladder. Patient tolerated well, complications were noted as: Patient experienced bladder spasms during instillation. Patient's cath was plugged and sent home with syringe and bag for disposal  Preformed by: Golden Hurter, CMA and Toniann Fail, LPN  Follow up/ Additional notes: 1 week BCG

## 2015-08-07 NOTE — Progress Notes (Signed)
8:40 AM   Jodi Colon 1929/07/16 [id number]  Referring provider: Tracie Harrier, MD [address] [facility], [place] [address]  Chief Complaint  Patient presents with  . Bladder Cancer    5th BCG    HPI: Patient is 82 year old female with a history of CIS bladder cancer. Previously a patient of Dr. Bernardo Heater at [facility]. She was diagnosed with high-grade TA TCC of the bladder in 2000 11. She underwent induction BCG 6 and briefly on maintenance. She then had an abnormal urine cytology on September 2014 which was consistent with CIS. She then underwent induction with only 4 treatments but did not complete the full course due to a nationwide backorder of the medication. Prior to today, her last cystoscopy was 03/2014.  She underwent cystoscopy by Dr. Erlene Quan on 05/06/15 demonstrating erythematous velvety patchesof erythema involving the posterior and left lateral wall of the bladder highly concerning for CIS.  05/20/15 cystoscopy, bilateral retrograde pyelogram and bladder biopsy x 6 performed by Dr. Erlene Quan. 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  She presents today for (5 of 6) installation of induction series of BCG treatments. She denies any changes in urinary symptoms or fevers. Patient reports that she has significant difficulty controlling her bladder. She is taken multiple medications in the past without improvement in her symptoms.  Foley catheter was placed at her last visit to help patient retain the BCG installation. She denies any fevers or flank pain. No nausea or vomiting. There was some concern last week that patient may have a urinary tract infection that her culture was negative for infection. She continues to experience irritative voiding symptoms and is continuing to take Myrbetriq to improve bladder spasms.  PMH: Past Medical History  Diagnosis Date  . Degenerative joint disease     [facility]  . Hypertension    . Hyperlipidemia   . MRSA (methicillin resistant Staphylococcus aureus) 2008    perineal abscess  . Spinal stenosis   . DCIS (ductal carcinoma in situ) of breast 2007    Right, s/p lumpectomy/xrt   . Cerebrovascular disease 2008    s/p PTCA/stent of MCA, Deveshwar  . History of MRSA infection   . Stroke ([place])   . Arthritis   . GERD (gastroesophageal reflux disease)   . Urge incontinence   . Nocturia   . Thyroid disease   . DDD (degenerative disc disease), cervical   . Incomplete bladder emptying   . Adnexal cyst   . Chronic kidney disease   . History of hiatal hernia   . Headache   . Bladder cancer ([place])   . 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 ([place])   . Breast cancer [facility]) 2007    Right Breast Lumpectomy  . History of TIA (transient ischemic attack)   . Hx MRSA infection   . Brain aneurysm     small, followed with regular angiograms, Dr. Estanislado Pandy    Surgical History: Past Surgical History  Procedure Laterality Date  . Cholecystectomy    . Colonoscopy  2013  . Hemorrhoid surgery  02/01/13  . Cataract extraction    . Tonsillectomy    . Breast lumpectomy    . Laparoscopic hysterectomy    . Abdominal hysterectomy    . Back surgery    . Fracture surgery Left     Ankle, compound fracture  . Eye surgery Bilateral     Cataract Extraction  . Bladder surgery  2012, 2014  .  Brain surgery Right 2008    stent, Zac[facility] Pontes, [place]  . Cystoscopy with biopsy N/A 05/20/2015    Procedure: CYSTOSCOPY WITH BIOPSY;  Surgeon: Hollice Espy, MD;  Location: [facility] ORS;  Service: Urology;  Laterality: N/A;  . Cystoscopy w/ retrogrades Bilateral 05/20/2015    Procedure: CYSTOSCOPY WITH RETROGRADE PYELOGRAM;  Surgeon: Hollice Espy, MD;  Location: [facility] ORS;  Service: Urology;  Laterality: Bilateral;    Home Medications:    Medication List       This list is accurate as of: 08/07/15 11:59 PM.  Always use your most recent med list.               amLODipine  10 MG tablet  Commonly known as:  NORVASC  Take 1 tablet (10 mg total) by mouth daily.     aspirin 81 MG tablet  Take 81 mg by mouth daily.     atorvastatin 80 MG tablet  Commonly known as:  LIPITOR  Take 80 mg by mouth daily at 6 PM.     econazole nitrate 1 % cream  Apply topically.     ferrous sulfate 325 (65 FE) MG EC tablet  Take by mouth.     gabapentin 100 MG capsule  Commonly known as:  NEURONTIN  Take 200 mg by mouth every morning. Take 400 mg at bedtime     HYDROcodone-acetaminophen 7.5-325 MG tablet  Commonly known as:  NORCO  Take 1 tablet by mouth 3 (three) times daily as needed for moderate pain.     levothyroxine 50 MCG tablet  Commonly known as:  SYNTHROID, LEVOTHROID  Take 25-50 mcg by mouth See admin instructions. Take one tablet by mouth daily except on sundays take one and one half by mouth. Per Med record     lisinopril 40 MG tablet  Commonly known as:  PRINIVIL,ZESTRIL  Take 1 tablet (40 mg total) by mouth daily.     multivitamin tablet  Take 1 tablet by mouth daily.     oxybutynin 5 MG tablet  Commonly known as:  DITROPAN  Reported on 07/08/2015     PROTONIX 40 MG tablet  Generic drug:  pantoprazole  Take 40 mg by mouth 2 (two) times daily.     tiZANidine 2 MG tablet  Commonly known as:  ZANAFLEX  Take 2 mg by mouth at bedtime.     topiramate 25 MG tablet  Commonly known as:  TOPAMAX  Take 25 mg by mouth daily.     Venlafaxine HCl 150 MG Tb24  Take 1 tablet by mouth daily.        Allergies:  Allergies  Allergen Reactions  . Prednisone     Patient reported this date that she was allergic to PCN, rather than Prednisone.  Patient reported this date that she was allergic to PCN, rather than Prednisone.   Sarina Ill [Bactrim] Itching  . Sulfasalazine Swelling    "swelling of face"  . Penicillins Rash    Rash Rash  . Sulfa Antibiotics Rash  . Sulfamethoxazole-Trimethoprim Rash    Family History: Family History  Problem Relation  Age of Onset  . Fibromyalgia Daughter     Social History:  reports that she has never smoked. She has never used smokeless tobacco. She reports that she does not drink alcohol or use illicit drugs.  ROS: UROLOGY Frequent Urination?: Yes Hard to postpone urination?: No Burning/pain with urination?: Yes Get up at night to urinate?: No Leakage of urine?: Yes Urine stream starts  and stops?: No Trouble starting stream?: No Do you have to strain to urinate?: No Blood in urine?: No Urinary tract infection?: No Sexually transmitted disease?: No Injury to kidneys or bladder?: No Painful intercourse?: No Weak stream?: No Currently pregnant?: No Vaginal bleeding?: No Last menstrual period?: n  Gastrointestinal Nausea?: No Vomiting?: No Indigestion/heartburn?: No Diarrhea?: No Constipation?: No  Constitutional Fever: No Night sweats?: No Weight loss?: Yes Fatigue?: No  Skin Skin rash/lesions?: No Itching?: No  Eyes Blurred vision?: No Double vision?: No  Ears/Nose/Throat Sore throat?: No Sinus problems?: No  Hematologic/Lymphatic Swollen glands?: No Easy bruising?: No  Cardiovascular Leg swelling?: Yes Chest pain?: No  Respiratory Cough?: No Shortness of breath?: No  Endocrine Excessive thirst?: No  Musculoskeletal Back pain?: Yes Joint pain?: Yes  Neurological Headaches?: No Dizziness?: No  Psychologic Depression?: No Anxiety?: No  Physical Exam: BP 126/71 mmHg  Pulse 73  Resp 16  Ht 4\' 9"  (1.448 m)  Wt 112 lb 4.8 oz (50.939 kg)  BMI 24.29 kg/m2  Constitutional:  Alert and oriented, No acute distress. HEENT: [place] AT, moist mucus membranes.  Trachea midline, no masses. Cardiovascular: No clubbing, cyanosis, or edema. Respiratory: Normal respiratory effort, no increased work of breathing. Skin: No rashes, bruises or suspicious lesions. Neurologic: Grossly intact, no focal deficits, moving all 4 extremities. Psychiatric: Normal mood and  affect.  Laboratory Data:   Urinalysis Results for orders placed or performed in visit on 08/07/15  Microscopic Examination  Result Value Ref Range   WBC, UA >30 (A) 0 -  5 /hpf   RBC, UA 11-30 (A) 0 -  2 /hpf   Epithelial Cells (non renal) 0-10 0 - 10 /hpf   Bacteria, UA None seen None seen/Few  Urinalysis, Complete  Result Value Ref Range   Specific Gravity, UA 1.025 1.005 - 1.030   pH, UA 7.0 5.0 - 7.5   Color, UA Yellow Yellow   Appearance Ur Cloudy (A) Clear   Leukocytes, UA 1+ (A) Negative   Protein, UA 3+ (A) Negative/Trace   Glucose, UA Negative Negative   Ketones, UA Negative Negative   RBC, UA 2+ (A) Negative   Bilirubin, UA Negative Negative   Urobilinogen, Ur 0.2 0.2 - 1.0 mg/dL   Nitrite, UA Negative Negative   Microscopic Examination See below:     Pertinent Imaging:  Assessment & Plan:  1. Malignant neoplasm of overlapping sites of bladder ([place])-  UA today highly suspicious for infection though patient is asymptomatic. Her UA was similar at her last visit and her culture returned with minimal bacterial growth consistent with vaginal contamination.  BCG was instilled today without difficulty. Foley catheter was placed and balloon was blown up without difficulty. Catheter was then plugged. Patient was instructed at her last visit to probably remove the Foley catheter and disposed of waste in the biochemical waste bag we've provided her. She will return next week for her 6th BCG - Urinalysis, Complete  Return in about 1 week (around 08/14/2015).  These notes generated with voice recognition software. I apologize for typographical errors.  Herbert Moors, [facility] [address] [place], [place] [address] [phone]

## 2015-08-08 ENCOUNTER — Ambulatory Visit
Admission: RE | Admit: 2015-08-08 | Discharge: 2015-08-08 | Disposition: A | Discharge status: Home / Self Care | Payer: Medicare Other | Source: Ambulatory Visit | Attending: Physician Assistant | Admitting: Physician Assistant

## 2015-08-08 ENCOUNTER — Other Ambulatory Visit: Payer: Self-pay | Admitting: Physician Assistant

## 2015-08-08 DIAGNOSIS — R600 Localized edema: Secondary | ICD-10-CM | POA: Insufficient documentation

## 2015-08-14 ENCOUNTER — Ambulatory Visit (INDEPENDENT_AMBULATORY_CARE_PROVIDER_SITE_OTHER): Payer: Medicare Other | Admitting: Obstetrics and Gynecology

## 2015-08-14 ENCOUNTER — Encounter: Payer: Self-pay | Admitting: Obstetrics and Gynecology

## 2015-08-14 VITALS — BP 107/58 | HR 67 | Ht <= 58 in | Wt 113.0 lb

## 2015-08-14 DIAGNOSIS — C679 Malignant neoplasm of bladder, unspecified: Secondary | ICD-10-CM | POA: Diagnosis not present

## 2015-08-14 LAB — URINALYSIS, COMPLETE
Bilirubin, UA: NEGATIVE
Glucose, UA: NEGATIVE
Ketones, UA: NEGATIVE
Nitrite, UA: NEGATIVE
Specific Gravity, UA: 1.02 (ref 1.005–1.030)
Urobilinogen, Ur: 0.2 mg/dL (ref 0.2–1.0)
pH, UA: 6 (ref 5.0–7.5)

## 2015-08-14 LAB — MICROSCOPIC EXAMINATION
Bacteria, UA: NONE SEEN
RBC, UA: >30 /hpf — AB (ref 0–?)
WBC, UA: >30 /hpf — AB (ref 0–?)

## 2015-08-14 MED ORDER — BCG LIVE 50 MG IS SUSR
3.2400 mL | Freq: Once | INTRAVESICAL | Status: AC
  Administered 2015-08-14: 81 mg via INTRAVESICAL

## 2015-08-14 MED ORDER — SODIUM CHLORIDE 0.9% FLUSH
50.0000 mL | Freq: Once | INTRAVENOUS | Status: AC
  Administered 2015-08-14: 50 mL

## 2015-08-14 NOTE — Progress Notes (Signed)
BCG Bladder Instillation  BCG # 6  Due to Bladder Cancer patient is present today for a BCG treatment. Patient was cleaned and prepped in a sterile fashion with betadine and lidocaine 2% jelly was instilled into the urethra.  A 16FR catheter was inserted, urine return was noted 55ml, urine was yellow in color, 5cc of sterile water inflated into the balloon.  31ml of reconstituted BCG was instilled into the bladder. The foley cath placed was left in the bladder, patient was sent home with 10cc syringe to remove foley at home per [place] instruction. Patient tolerated ok, complications were noted as: bladder spasms  Preformed by: Fonnie Jarvis, CMA, Herbert Moors, NP  Follow up/ Additional notes: 93months for a cysto

## 2015-08-14 NOTE — Addendum Note (Signed)
Addended by: Tommy Rainwater on: 08/14/2015 01:31 PM   Modules accepted: Orders

## 2015-08-14 NOTE — Progress Notes (Signed)
11:31 AM   Jodi Colon [birth year] [id number]  Referring provider: Tracie Harrier, MD [address] [facility] [place], [place] [address]  Chief Complaint  Patient presents with  . BCG    6th BCG    HPI: Patient is 82 year old female with a history of CIS bladder cancer. Previously a patient of Dr. Bernardo Heater at [facility]. She was diagnosed with high-grade TA TCC of the bladder in 2000 11. She underwent induction BCG 6 and briefly on maintenance. She then had an abnormal urine cytology on September 2014 which was consistent with CIS. She then underwent induction with only 4 treatments but did not complete the full course due to a nationwide backorder of the medication. Prior to today, her last cystoscopy was 03/2014.  She underwent cystoscopy by Dr. Erlene Quan on 05/06/15 demonstrating erythematous velvety patchesof erythema involving the posterior and left lateral wall of the bladder highly concerning for CIS.  05/20/15 cystoscopy, bilateral retrograde pyelogram and bladder biopsy x 6 performed by Dr. Erlene Quan. 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  She presents today for (6 of 6) installation of induction series of BCG treatments. She denies any changes in urinary symptoms or fevers. Patient reports that she has significant difficulty controlling her bladder. She is taken multiple medications in the past without improvement in her symptoms.  Foley catheter was placed at her last visit to help patient retain the BCG installation. She denies any fevers or flank pain. No nausea or vomiting. There was some concern last week that patient may have a urinary tract infection that her culture was negative for infection. She continues to experience irritative voiding symptoms and is continuing to take Myrbetriq to improve bladder spasms.  PMH: Past Medical History  Diagnosis Date  . Degenerative joint disease     [facility] by the [facility]  . Hypertension   .  Hyperlipidemia   . MRSA (methicillin resistant Staphylococcus aureus) 2008    perineal abscess  . Spinal stenosis   . DCIS (ductal carcinoma in situ) of breast 2007    Right, s/p lumpectomy/xrt   . Cerebrovascular disease 2008    s/p PTCA/stent of MCA, [place]eshwar  . History of MRSA infection   . Stroke ([place])   . Arthritis   . GERD (gastroesophageal reflux disease)   . Urge incontinence   . Nocturia   . Thyroid disease   . DDD (degenerative disc disease), cervical   . Incomplete bladder emptying   . Adnexal cyst   . Chronic kidney disease   . History of hiatal hernia   . Headache   . Bladder cancer ([place])   . 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 ([place])   . Breast cancer [facility]) 2007    Right Breast Lumpectomy  . History of TIA (transient ischemic attack)   . Hx MRSA infection   . Brain aneurysm     small, followed with regular angiograms, Dr. Estanislado Pandy    Surgical History: Past Surgical History  Procedure Laterality Date  . Cholecystectomy    . Colonoscopy  2013  . Hemorrhoid surgery  02/01/13  . Cataract extraction    . Tonsillectomy    . Breast lumpectomy    . Laparoscopic hysterectomy    . Abdominal hysterectomy    . Back surgery    . Fracture surgery Left     Ankle, compound fracture  . Eye surgery Bilateral     Cataract Extraction  . Bladder surgery  2012, 2014  .  Brain surgery Right 2008    stent, Zacarias Pontes, [place]  . Cystoscopy with biopsy N/A 05/20/2015    Procedure: CYSTOSCOPY WITH BIOPSY;  Surgeon: Hollice Espy, MD;  Location: [facility] ORS;  Service: Urology;  Laterality: N/A;  . Cystoscopy w/ retrogrades Bilateral 05/20/2015    Procedure: CYSTOSCOPY WITH RETROGRADE PYELOGRAM;  Surgeon: Hollice Espy, MD;  Location: [facility] ORS;  Service: Urology;  Laterality: Bilateral;    Home Medications:    Medication List       This list is accurate as of: 08/14/15 11:31 AM.  Always use your most recent med list.               amLODipine 10  MG tablet  Commonly known as:  NORVASC  Take 1 tablet (10 mg total) by mouth daily.     aspirin 81 MG tablet  Take 81 mg by mouth daily.     atorvastatin 80 MG tablet  Commonly known as:  LIPITOR  Take 80 mg by mouth daily at 6 PM.     econazole nitrate 1 % cream  Apply topically.     ferrous sulfate 325 (65 FE) MG EC tablet  Take by mouth.     gabapentin 100 MG capsule  Commonly known as:  NEURONTIN  Take 200 mg by mouth every morning. Take 400 mg at bedtime     HYDROcodone-acetaminophen 7.5-325 MG tablet  Commonly known as:  NORCO  Take 1 tablet by mouth 3 (three) times daily as needed for moderate pain.     levothyroxine 50 MCG tablet  Commonly known as:  SYNTHROID, LEVOTHROID  Take 25-50 mcg by mouth See admin instructions. Take one tablet by mouth daily except on sundays take one and one half by mouth. Per Med record     lisinopril 40 MG tablet  Commonly known as:  PRINIVIL,ZESTRIL  Take 1 tablet (40 mg total) by mouth daily.     multivitamin tablet  Take 1 tablet by mouth daily.     oxybutynin 5 MG tablet  Commonly known as:  DITROPAN  Reported on 07/08/2015     PROTONIX 40 MG tablet  Generic drug:  pantoprazole  Take 40 mg by mouth 2 (two) times daily.     tiZANidine 2 MG tablet  Commonly known as:  ZANAFLEX  Take 2 mg by mouth at bedtime.     topiramate 25 MG tablet  Commonly known as:  TOPAMAX  Take 25 mg by mouth daily.     Venlafaxine HCl 150 MG Tb24  Take 1 tablet by mouth daily.        Allergies:  Allergies  Allergen Reactions  . Prednisone     Patient reported this date that she was allergic to PCN, rather than Prednisone.  Patient reported this date that she was allergic to PCN, rather than Prednisone.   Sarina Ill [Bactrim] Itching  . Sulfasalazine Swelling    "swelling of face"  . Penicillins Rash    Rash Rash  . Sulfa Antibiotics Rash  . Sulfamethoxazole-Trimethoprim Rash    Family History: Family History  Problem Relation Age  of Onset  . Fibromyalgia Daughter     Social History:  reports that she has never smoked. She has never used smokeless tobacco. She reports that she does not drink alcohol or use illicit drugs.  ROS: UROLOGY Frequent Urination?: Yes Hard to postpone urination?: Yes Burning/pain with urination?: No Get up at night to urinate?: No Leakage of urine?: No Urine stream starts  and stops?: No Trouble starting stream?: No Do you have to strain to urinate?: No Blood in urine?: No Urinary tract infection?: No Sexually transmitted disease?: No Injury to kidneys or bladder?: No Painful intercourse?: No Weak stream?: No Currently pregnant?: No Vaginal bleeding?: No Last menstrual period?: n  Gastrointestinal Nausea?: No Vomiting?: No Indigestion/heartburn?: No Diarrhea?: No Constipation?: No  Constitutional Fever: No Night sweats?: No Weight loss?: No Fatigue?: No  Skin Skin rash/lesions?: No Itching?: No  Eyes Blurred vision?: No Double vision?: No  Ears/Nose/Throat Sore throat?: No Sinus problems?: No  Hematologic/Lymphatic Swollen glands?: No Easy bruising?: No  Cardiovascular Leg swelling?: No Chest pain?: No  Respiratory Cough?: No Shortness of breath?: No  Endocrine Excessive thirst?: No  Musculoskeletal Back pain?: No Joint pain?: No  Neurological Headaches?: No Dizziness?: No  Psychologic Depression?: No Anxiety?: No  Physical Exam: BP 107/58 mmHg  Pulse 67  Ht 4\' 9"  (1.448 m)  Wt 113 lb (51.256 kg)  BMI 24.45 kg/m2  Constitutional:  Alert and oriented, No acute distress. HEENT: [place] AT, moist mucus membranes.  Trachea midline, no masses. Cardiovascular: No clubbing, cyanosis, or edema. Respiratory: Normal respiratory effort, no increased work of breathing. Skin: No rashes, bruises or suspicious lesions. Neurologic: Grossly intact, no focal deficits, moving all 4 extremities. Psychiatric: Normal mood and affect.  Laboratory  Data:   Urinalysis Results for orders placed or performed in visit on 08/07/15  Microscopic Examination  Result Value Ref Range   WBC, UA >30 (A) 0 -  5 /hpf   RBC, UA 11-30 (A) 0 -  2 /hpf   Epithelial Cells (non renal) 0-10 0 - 10 /hpf   Bacteria, UA None seen None seen/Few  Urinalysis, Complete  Result Value Ref Range   Specific Gravity, UA 1.025 1.005 - 1.030   pH, UA 7.0 5.0 - 7.5   Color, UA Yellow Yellow   Appearance Ur Cloudy (A) Clear   Leukocytes, UA 1+ (A) Negative   Protein, UA 3+ (A) Negative/Trace   Glucose, UA Negative Negative   Ketones, UA Negative Negative   RBC, UA 2+ (A) Negative   Bilirubin, UA Negative Negative   Urobilinogen, Ur 0.2 0.2 - 1.0 mg/dL   Nitrite, UA Negative Negative   Microscopic Examination See below:     Pertinent Imaging:  Assessment & Plan:  1. Malignant neoplasm of overlapping sites of bladder ([place])-  UA today highly suspicious for infection though patient is asymptomatic. Her UA was similar at her last visit and her culture returned with minimal bacterial growth consistent with vaginal contamination.  BCG was instilled today without difficulty. Foley catheter was placed and balloon was blown up without difficulty. Catheter was then plugged. Patient was instructed at her last visit to probably remove the Foley catheter and disposed of waste in the biochemical waste bag we've provided her. She will return in 3 months for cystoscopy with Dr. Erlene Quan - Urinalysis, Complete  Return for 3 months for cystosocpy with Dr. Erlene Quan.  These notes generated with voice recognition software. I apologize for typographical errors.  Herbert Moors, [facility] [address] [place], [place] [address] [phone]

## 2015-08-22 ENCOUNTER — Other Ambulatory Visit: Payer: Self-pay | Admitting: Internal Medicine

## 2015-08-22 DIAGNOSIS — C679 Malignant neoplasm of bladder, unspecified: Secondary | ICD-10-CM

## 2015-08-22 DIAGNOSIS — R600 Localized edema: Secondary | ICD-10-CM

## 2015-08-22 DIAGNOSIS — R634 Abnormal weight loss: Secondary | ICD-10-CM

## 2015-08-27 ENCOUNTER — Other Ambulatory Visit: Payer: Self-pay | Admitting: Radiology

## 2015-08-27 NOTE — Patient Instructions (Signed)
Pt returning phone call, preop instructions reviewed, arrival time, entrance, NPO, need for driver and family member with pt overnight, and take meds with sips of water in am.  Pt verbalizes understanding.

## 2015-08-28 ENCOUNTER — Ambulatory Visit (HOSPITAL_COMMUNITY)
Admission: RE | Admit: 2015-08-28 | Discharge: 2015-08-28 | Disposition: A | Discharge status: Home / Self Care | Payer: Medicare Other | Source: Ambulatory Visit | Attending: Interventional Radiology | Admitting: Interventional Radiology

## 2015-08-28 ENCOUNTER — Encounter (HOSPITAL_COMMUNITY): Payer: Self-pay

## 2015-08-28 DIAGNOSIS — N189 Chronic kidney disease, unspecified: Secondary | ICD-10-CM | POA: Diagnosis not present

## 2015-08-28 DIAGNOSIS — I771 Stricture of artery: Secondary | ICD-10-CM

## 2015-08-28 DIAGNOSIS — Z48812 Encounter for surgical aftercare following surgery on the circulatory system: Secondary | ICD-10-CM | POA: Insufficient documentation

## 2015-08-28 DIAGNOSIS — Z8673 Personal history of transient ischemic attack (TIA), and cerebral infarction without residual deficits: Secondary | ICD-10-CM | POA: Insufficient documentation

## 2015-08-28 DIAGNOSIS — Z79899 Other long term (current) drug therapy: Secondary | ICD-10-CM | POA: Diagnosis not present

## 2015-08-28 DIAGNOSIS — Z7982 Long term (current) use of aspirin: Secondary | ICD-10-CM | POA: Insufficient documentation

## 2015-08-28 DIAGNOSIS — I129 Hypertensive chronic kidney disease with stage 1 through stage 4 chronic kidney disease, or unspecified chronic kidney disease: Secondary | ICD-10-CM | POA: Diagnosis not present

## 2015-08-28 DIAGNOSIS — K219 Gastro-esophageal reflux disease without esophagitis: Secondary | ICD-10-CM | POA: Insufficient documentation

## 2015-08-28 DIAGNOSIS — Z8551 Personal history of malignant neoplasm of bladder: Secondary | ICD-10-CM | POA: Diagnosis not present

## 2015-08-28 DIAGNOSIS — E785 Hyperlipidemia, unspecified: Secondary | ICD-10-CM | POA: Diagnosis not present

## 2015-08-28 DIAGNOSIS — Z538 Procedure and treatment not carried out for other reasons: Secondary | ICD-10-CM | POA: Insufficient documentation

## 2015-08-28 DIAGNOSIS — E079 Disorder of thyroid, unspecified: Secondary | ICD-10-CM | POA: Insufficient documentation

## 2015-08-28 LAB — BASIC METABOLIC PANEL
Anion gap: 7 (ref 5–15)
BUN: 14 mg/dL (ref 6–20)
CO2: 26 mmol/L (ref 22–32)
Calcium: 8.9 mg/dL (ref 8.9–10.3)
Chloride: 104 mmol/L (ref 101–111)
Creatinine, Ser: 0.96 mg/dL (ref 0.44–1.00)
GFR calc Af Amer: >60 mL/min (ref >60)
GFR calc non Af Amer: 53 mL/min — ABNORMAL LOW (ref >60)
Glucose, Bld: 94 mg/dL (ref 65–99)
Potassium: 4.4 mmol/L (ref 3.5–5.1)
Sodium: 137 mmol/L (ref 135–145)

## 2015-08-28 LAB — CBC
HCT: 32.8 % — ABNORMAL LOW (ref 36.0–46.0)
Hemoglobin: 10.8 g/dL — ABNORMAL LOW (ref 12.0–15.0)
MCH: 30.5 pg (ref 26.0–34.0)
MCHC: 32.9 g/dL (ref 30.0–36.0)
MCV: 92.7 fL (ref 78.0–100.0)
Platelets: 249 10*3/uL (ref 150–400)
RBC: 3.54 MIL/uL — ABNORMAL LOW (ref 3.87–5.11)
RDW: 14.2 % (ref 11.5–15.5)
WBC: 5.4 10*3/uL (ref 4.0–10.5)

## 2015-08-28 LAB — PROTIME-INR
INR: 1.05 (ref 0.00–1.49)
Prothrombin Time: 13.9 seconds (ref 11.6–15.2)

## 2015-08-28 LAB — APTT: aPTT: 27 seconds (ref 24–37)

## 2015-08-28 MED ORDER — LIDOCAINE HCL 1 % IJ SOLN
INTRAMUSCULAR | Status: AC
  Filled 2015-08-28: qty 20

## 2015-08-28 MED ORDER — MIDAZOLAM HCL 2 MG/2ML IJ SOLN
INTRAMUSCULAR | Status: AC
  Filled 2015-08-28: qty 2

## 2015-08-28 MED ORDER — FENTANYL CITRATE (PF) 100 MCG/2ML IJ SOLN
INTRAMUSCULAR | Status: DC
Start: 2015-08-28 — End: 2015-08-28
  Filled 2015-08-28: qty 2

## 2015-08-28 MED ORDER — SODIUM CHLORIDE 0.9 % IV SOLN
Freq: Once | INTRAVENOUS | Status: AC
  Administered 2015-08-28: 08:00:00 via INTRAVENOUS

## 2015-08-28 MED ORDER — HEPARIN SODIUM (PORCINE) 1000 UNIT/ML IJ SOLN
INTRAMUSCULAR | Status: AC
  Filled 2015-08-28: qty 1

## 2015-08-28 NOTE — H&P (Signed)
Chief Complaint: Patient was seen in consultation today for cerebral angiogram at the request of Deveshwar,Sanjeev  Referring Physician(s): Deveshwar,Sanjeev   History of Present Illness: Jodi Colon is a 82 y.o. female with prior hx of MCA stent and other intracranial arterial stenoses. She has been followed by MRI/MRA but is scheduled today for formal cerebral arteriogram. She has been doing fairly well, no neurologic changes. She continues to take ASA 325mg  daily. She was taken off Plvix due to a SAH. PMHx, meds, labs, imaging, allergies reviewed. Has been NPO this am.   Past Medical History  Diagnosis Date  . Degenerative joint disease     [facility]  . Hypertension   . Hyperlipidemia   . MRSA (methicillin resistant Staphylococcus aureus) 2008    perineal abscess  . Spinal stenosis   . DCIS (ductal carcinoma in situ) of breast 2007    Right, s/p lumpectomy/xrt   . Cerebrovascular disease 2008    s/p PTCA/stent of MCA, Deveshwar  . History of MRSA infection   . Stroke ([place])   . Arthritis   . GERD (gastroesophageal reflux disease)   . Urge incontinence   . Nocturia   . Thyroid disease   . DDD (degenerative disc disease), cervical   . Incomplete bladder emptying   . Adnexal cyst   . Chronic kidney disease   . History of hiatal hernia   . Headache   . Bladder cancer ([place])   . 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 Breast cancer [facility]) 2007    Right Breast Lumpectomy  . History of TIA (transient ischemic attack)   . Hx MRSA infection   . Brain aneurysm     small, followed with regular angiograms, Dr. Estanislado Pandy    Past Surgical History  Procedure Laterality Date  . Cholecystectomy    . Colonoscopy  2013  . Hemorrhoid surgery  02/01/13  . Cataract extraction    . Tonsillectomy    . Breast lumpectomy    . Laparoscopic hysterectomy    . Abdominal hysterectomy    . Back surgery    . Fracture surgery Left    Ankle, compound fracture  . Eye surgery Bilateral     Cataract Extraction  . Bladder surgery  2012, 2014  . Brain surgery Right 2008    stent, [facility], [place]  . Cystoscopy with biopsy N/A 05/20/2015    Procedure: CYSTOSCOPY WITH BIOPSY;  Surgeon: Hollice Espy, MD;  Location: [facility] ORS;  Service: Urology;  Laterality: N/A;  . Cystoscopy w/ retrogrades Bilateral 05/20/2015    Procedure: CYSTOSCOPY WITH RETROGRADE PYELOGRAM;  Surgeon: Hollice Espy, MD;  Location: [facility] ORS;  Service: Urology;  Laterality: Bilateral;    Allergies: Prednisone; Septra; Sulfasalazine; Penicillins; Sulfa antibiotics; and Sulfamethoxazole-trimethoprim  Medications: Prior to Admission medications   Medication Sig Start Date End Date Taking? Authorizing Provider  amLODipine (NORVASC) 5 MG tablet Take 5 mg by mouth daily.   Yes Historical Provider, MD  aspirin 81 MG tablet Take 81 mg by mouth daily.   Yes Historical Provider, MD  atorvastatin (LIPITOR) 20 MG tablet Take 20 mg by mouth every morning.   Yes Historical Provider, MD  ferrous sulfate 325 (65 FE) MG EC tablet Take 325 mg by mouth daily with breakfast.  06/23/15 06/22/16 Yes Historical Provider, MD  gabapentin (NEURONTIN) 100 MG capsule Take 200 mg by mouth every morning. Take 400 mg at bedtime 03/20/15  Yes Historical Provider,  MD  HYDROcodone-acetaminophen (NORCO) 7.5-325 MG per tablet Take 1 tablet by mouth 3 (three) times daily as needed for moderate pain.  01/07/14  Yes Historical Provider, MD  levothyroxine (SYNTHROID, LEVOTHROID) 50 MCG tablet Take 50-75 mcg by mouth See admin instructions. Take one tablet by mouth daily except on sundays take one and one half by mouth. Per Med record 11/23/13  Yes Historical Provider, MD  lisinopril (PRINIVIL,ZESTRIL) 40 MG tablet Take 1 tablet (40 mg total) by mouth daily. 06/07/11  Yes Crecencio Mc, MD  Multiple Vitamin (MULTIVITAMIN) tablet Take 1 tablet by mouth daily.     Yes Historical Provider, MD    oxybutynin (DITROPAN) 5 MG tablet Take by mouth 2 (two) times daily. Reported on 07/08/2015 05/20/15  Yes Historical Provider, MD  pantoprazole (PROTONIX) 40 MG tablet Take 40 mg by mouth 2 (two) times daily.  06/19/13  Yes Historical Provider, MD  tiZANidine (ZANAFLEX) 2 MG tablet Take 2 mg by mouth at bedtime.  04/02/15  Yes Historical Provider, MD  topiramate (TOPAMAX) 25 MG tablet Take 25 mg by mouth daily.    Yes Historical Provider, MD  traZODone (DESYREL) 50 MG tablet Take 50-100 mg by mouth at bedtime as needed for sleep.   Yes Historical Provider, MD  Venlafaxine HCl 150 MG TB24 Take 1 tablet by mouth daily.   Yes Historical Provider, MD     Family History  Problem Relation Age of Onset  . Fibromyalgia Daughter     Social History   Social History  . Marital Status: Widowed    Spouse Name: N/A  . Number of Children: N/A  . Years of Education: N/A   Social History Main Topics  . Smoking status: Never Smoker   . Smokeless tobacco: Never Used  . Alcohol Use: No  . Drug Use: No  . Sexual Activity: Not Asked   Other Topics Concern  . None   Social History Narrative    ECOG Status: 1 - Symptomatic but completely ambulatory  Review of Systems: A 12 point ROS discussed and pertinent positives are indicated in the HPI above.  All other systems are negative.  Review of Systems  Vital Signs: BP 120/55 mmHg  Pulse 76  Temp(Src) 97.5 F (36.4 C) (Oral)  Resp 18  Ht 4\' 9"  (1.448 m)  Wt 112 lb (50.803 kg)  BMI 24.23 kg/m2  SpO2 98%  Physical Exam  Constitutional: She is oriented to person, place, and time. She appears well-developed and well-nourished. No distress.  HENT:  Head: Normocephalic.  Mouth/Throat: Oropharynx is clear and moist.  Neck: Normal range of motion. No tracheal deviation present.  Cardiovascular: Normal rate, regular rhythm, normal heart sounds and intact distal pulses.   Pulmonary/Chest: Effort normal and breath sounds normal. No respiratory  distress.  Abdominal: Soft. There is no tenderness.  Neurological: She is alert and oriented to person, place, and time.  Psychiatric: She has a normal mood and affect. Judgment normal.    Mallampati Score:  MD Evaluation Airway: WNL Heart: WNL Abdomen: WNL Chest/ Lungs: WNL ASA  Classification: 3 Mallampati/Airway Score: Two  Imaging: US Venous Img Lower Unilateral Right  08/08/2015  CLINICAL DATA:  Right lower extremity edema EXAM: Right LOWER EXTREMITY VENOUS DOPPLER ULTRASOUND TECHNIQUE: Gray-scale sonography with graded compression, as well as color Doppler and duplex ultrasound were performed to evaluate the lower extremity deep venous systems from the level of the common femoral vein and including the common femoral, femoral, profunda femoral, popliteal and calf  veins including the posterior tibial, peroneal and gastrocnemius veins when visible. The superficial great saphenous vein was also interrogated. Spectral Doppler was utilized to evaluate flow at rest and with distal augmentation maneuvers in the common femoral, femoral and popliteal veins. COMPARISON:  None. FINDINGS: Contralateral Common Femoral Vein: Respiratory phasicity is normal and symmetric with the symptomatic side. No evidence of thrombus. Normal compressibility. Common Femoral Vein: No evidence of thrombus. Normal compressibility, respiratory phasicity and response to augmentation. Saphenofemoral Junction: No evidence of thrombus. Normal compressibility and flow on color Doppler imaging. Profunda Femoral Vein: No evidence of thrombus. Normal compressibility and flow on color Doppler imaging. Femoral Vein: No evidence of thrombus. Normal compressibility, respiratory phasicity and response to augmentation. Popliteal Vein: No evidence of thrombus. Normal compressibility, respiratory phasicity and response to augmentation. Calf Veins: No evidence of thrombus. Normal compressibility and flow on color Doppler imaging. Superficial  Great Saphenous Vein: No evidence of thrombus. Normal compressibility and flow on color Doppler imaging. Venous Reflux:  None. Other Findings:  None. IMPRESSION: No evidence of deep venous thrombosis. Electronically Signed   By: Franchot Gallo M.D.   On: 08/08/2015 17:18    Labs:  CBC:  Recent Labs  02/09/15 1542 02/10/15 1635 05/12/15 1405 08/28/15 0842  WBC 12.7* 8.4 5.6 5.4  HGB 12.2 12.9 11.3* 10.8*  HCT 36.3 38.1 34.2* 32.8*  PLT 203 263 190 249    COAGS:  Recent Labs  08/28/15 0842  INR 1.05  APTT 27    BMP:  Recent Labs  02/09/15 1542 02/10/15 1635 02/14/15 1920 08/28/15 0842  NA 123* 128* 132* 137  K 3.9 4.1 3.6 4.4  CL 88* 96* 99* 104  CO2 26 22 25 26   GLUCOSE 123* 115* 117* 94  BUN 14 7 20 14   CALCIUM 9.0 9.2 9.0 8.9  CREATININE 0.63 0.66 0.65 0.96  GFRNONAA >60 >60 >60 53*  GFRAA >60 >60 >60 >60    LIVER FUNCTION TESTS:  Recent Labs  02/09/15 1542 02/14/15 1920  BILITOT 1.0 0.7  AST 42* 34  ALT 25 33  ALKPHOS 101 86  PROT 7.0 6.3*  ALBUMIN 4.4 3.7    TUMOR MARKERS: No results for input(s): AFPTM, CEA, CA199, CHROMGRNA in the last 8760 hours.  Assessment and Plan: Hx of MCA stenosis/angioplasty For cerebral arteriogram Labs ok Risks and Benefits discussed with the patient including, but not limited to bleeding, infection, vascular injury or contrast induced renal failure. All of the patient's questions were answered, patient is agreeable to proceed. Consent signed and in chart.   Thank you for this interesting consult.  I greatly enjoyed meeting Jodi Colon and look forward to participating in their care.  A copy of this report was sent to the requesting provider on this date.  Electronically Signed: Ascencion Dike 08/28/2015, 10:06 AM

## 2015-08-28 NOTE — Sedation Documentation (Addendum)
Dr, Estanislado Pandy saw pt and daughter, feels it would be better  for pt to reschedule angiogram for a later date due to issues she is having physically. Pt will be discharged home with dtr

## 2015-08-29 ENCOUNTER — Ambulatory Visit: Admission: RE | Admit: 2015-08-29 | Payer: Medicare Other | Source: Ambulatory Visit

## 2015-09-02 ENCOUNTER — Ambulatory Visit
Admission: RE | Admit: 2015-09-02 | Discharge: 2015-09-02 | Disposition: A | Discharge status: Home / Self Care | Payer: Medicare Other | Source: Ambulatory Visit | Attending: Internal Medicine | Admitting: Internal Medicine

## 2015-09-02 ENCOUNTER — Telehealth (HOSPITAL_COMMUNITY): Payer: Self-pay

## 2015-09-02 DIAGNOSIS — R600 Localized edema: Secondary | ICD-10-CM | POA: Diagnosis present

## 2015-09-02 DIAGNOSIS — C679 Malignant neoplasm of bladder, unspecified: Secondary | ICD-10-CM | POA: Insufficient documentation

## 2015-09-02 DIAGNOSIS — R634 Abnormal weight loss: Secondary | ICD-10-CM | POA: Diagnosis not present

## 2015-09-02 DIAGNOSIS — K209 Esophagitis, unspecified: Secondary | ICD-10-CM | POA: Insufficient documentation

## 2015-09-02 LAB — POCT I-STAT CREATININE: Creatinine, Ser: 1.2 mg/dL — ABNORMAL HIGH (ref 0.44–1.00)

## 2015-09-02 MED ORDER — IOPAMIDOL (ISOVUE-300) INJECTION 61%
80.0000 mL | Freq: Once | INTRAVENOUS | Status: AC | PRN
  Administered 2015-09-02: 80 mL via INTRAVENOUS

## 2015-09-02 NOTE — Telephone Encounter (Signed)
Called to check on pt and see about angiogram. Pt stated that she came to get angio but with all of her other issues that she was having along with her swollen legs Dr. Estanislado Pandy felt it was best to hold off on the angio right now. He told her that it was not something she had to get right now and we could reschedule when she was better. I told her that we would give her a call when it was time for next f/u or to give [place] a call if she has any questions or new symptoms. Pt agreed with this plan. AW

## 2015-09-17 ENCOUNTER — Ambulatory Visit: Payer: Medicare Other

## 2015-09-19 ENCOUNTER — Ambulatory Visit
Admission: RE | Admit: 2015-09-19 | Discharge: 2015-09-19 | Disposition: A | Discharge status: Home / Self Care | Payer: Medicare Other | Source: Ambulatory Visit | Attending: Internal Medicine | Admitting: Internal Medicine

## 2015-09-19 ENCOUNTER — Other Ambulatory Visit: Payer: Self-pay | Admitting: Internal Medicine

## 2015-09-19 DIAGNOSIS — Z1231 Encounter for screening mammogram for malignant neoplasm of breast: Secondary | ICD-10-CM

## 2015-09-19 HISTORY — DX: Reserved for concepts with insufficient information to code with codable children: IMO0002

## 2015-10-13 ENCOUNTER — Encounter: Payer: Self-pay | Admitting: Urology

## 2015-10-13 ENCOUNTER — Ambulatory Visit (INDEPENDENT_AMBULATORY_CARE_PROVIDER_SITE_OTHER): Payer: Medicare Other | Admitting: Urology

## 2015-10-13 VITALS — BP 123/71 | HR 82 | Ht <= 58 in | Wt 109.0 lb

## 2015-10-13 DIAGNOSIS — R300 Dysuria: Secondary | ICD-10-CM

## 2015-10-13 DIAGNOSIS — C679 Malignant neoplasm of bladder, unspecified: Secondary | ICD-10-CM

## 2015-10-13 DIAGNOSIS — R350 Frequency of micturition: Secondary | ICD-10-CM

## 2015-10-13 LAB — URINALYSIS, COMPLETE
Bilirubin, UA: NEGATIVE
Glucose, UA: NEGATIVE
Ketones, UA: NEGATIVE
Nitrite, UA: NEGATIVE
Specific Gravity, UA: 1.01 (ref 1.005–1.030)
Urobilinogen, Ur: 0.2 mg/dL (ref 0.2–1.0)
pH, UA: 7 (ref 5.0–7.5)

## 2015-10-13 LAB — MICROSCOPIC EXAMINATION
RBC, UA: >30 /hpf — ABNORMAL HIGH (ref 0–?)
WBC, UA: >30 /hpf — ABNORMAL HIGH (ref 0–?)

## 2015-10-13 LAB — BLADDER SCAN AMB NON-IMAGING

## 2015-10-13 NOTE — Progress Notes (Signed)
10:22 PM   Jodi Colon 12-May-1930 [id number]  Referring provider: Tracie Harrier, MD [address] [facility] [name], [name] [address]  Chief Complaint  Patient presents with  . Urinary Frequency    HPI: Patient is an 82 year old Caucasian female who presents today complaining of severe urinary frequency.  Background history Patient with a history of CIS bladder cancer. Previously a patient of Dr. Bernardo Heater at [facility]. She was diagnosed with high-grade TA TCC of the bladder in 2011. She underwent induction BCG 6 and briefly on maintenance. She then had an abnormal urine cytology on September 2014 which was consistent with CIS. She then underwent induction with only 4 treatments but did not complete the full course due to a nationwide backorder of the medication. 05/20/15 cystoscopy, bilateral retrograde pyelogram and bladder biopsy x 6 performed by Dr. Erlene Quan. 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  She completed an induction series of BCG on 08/14/2015.  She has an upcoming surveillance cystoscopy scheduled on 11/14/2015.  Today, she is experiencing frequent urination, urgency, dysuria, urge incontinence and a weak urinary stream.  These symptoms have been incredibly worse over the last few days. She has not had gross hematuria or suprapubic pain.  Her UA today demonstrates 30 WBCs and 30 RBCs per high-power field.  Her PVR was 43 mL.  She has not had fevers, chills, nausea or vomiting.   PMH: Past Medical History  Diagnosis Date  . Degenerative joint disease     [facility] by [facility]  . Hypertension   . Hyperlipidemia   . MRSA (methicillin resistant Staphylococcus aureus) 2008    perineal abscess  . Spinal stenosis   . DCIS (ductal carcinoma in situ) of breast 2007    Right, s/p lumpectomy/xrt   . Cerebrovascular disease 2008    s/p PTCA/stent of MCA, Deveshwar  . History of MRSA infection   . Stroke ([place])   . Arthritis   . GERD  (gastroesophageal reflux disease)   . Urge incontinence   . Nocturia   . Thyroid disease   . DDD (degenerative disc disease), cervical   . Incomplete bladder emptying   . Adnexal cyst   . Chronic kidney disease   . History of hiatal hernia   . Headache   . History of TIA (transient ischemic attack)   . Hx MRSA infection   . Brain aneurysm     small, followed with regular angiograms, Dr. Estanislado Pandy  . Bladder cancer ([place]) 04/2015  . 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 Breast cancer [facility]) 2007    Right Breast Lumpectomy  . Radiation 2007    BREAST CA    Surgical History: Past Surgical History  Procedure Laterality Date  . Cholecystectomy    . Colonoscopy  2013  . Hemorrhoid surgery  02/01/13  . Cataract extraction    . Tonsillectomy    . Breast lumpectomy    . Laparoscopic hysterectomy    . Abdominal hysterectomy    . Back surgery    . Fracture surgery Left     Ankle, compound fracture  . Eye surgery Bilateral     Cataract Extraction  . Bladder surgery  2012, 2014  . Brain surgery Right 2008    stent, [facility], [place]  . Cystoscopy with biopsy N/A 05/20/2015    Procedure: CYSTOSCOPY WITH BIOPSY;  Surgeon: Hollice Espy, MD;  Location: [facility] ORS;  Service: Urology;  Laterality: N/A;  . Cystoscopy  w/ retrogrades Bilateral 05/20/2015    Procedure: CYSTOSCOPY WITH RETROGRADE PYELOGRAM;  Surgeon: Hollice Espy, MD;  Location: [facility] ORS;  Service: Urology;  Laterality: Bilateral;    Home Medications:    Medication List       This list is accurate as of: 10/13/15 11:59 PM.  Always use your most recent med list.               amLODipine 5 MG tablet  Commonly known as:  NORVASC  Take 5 mg by mouth daily.     aspirin 81 MG tablet  Take 81 mg by mouth daily.     atorvastatin 20 MG tablet  Commonly known as:  LIPITOR  Take 20 mg by mouth every morning.     ferrous sulfate 325 (65 FE) MG EC tablet  Take 325 mg by mouth daily with  breakfast.     gabapentin 100 MG capsule  Commonly known as:  NEURONTIN  Take 200 mg by mouth every morning. Take 400 mg at bedtime     HYDROcodone-acetaminophen 7.5-325 MG tablet  Commonly known as:  NORCO  Take 1 tablet by mouth 3 (three) times daily as needed for moderate pain.     levothyroxine 50 MCG tablet  Commonly known as:  SYNTHROID, LEVOTHROID  Take 50-75 mcg by mouth See admin instructions. Take one tablet by mouth daily except on sundays take one and one half by mouth. Per Med record     lisinopril 40 MG tablet  Commonly known as:  PRINIVIL,ZESTRIL  Take 1 tablet (40 mg total) by mouth daily.     multivitamin tablet  Take 1 tablet by mouth daily.     PROTONIX 40 MG tablet  Generic drug:  pantoprazole  Take 40 mg by mouth 2 (two) times daily.     tiZANidine 2 MG tablet  Commonly known as:  ZANAFLEX  Take 2 mg by mouth at bedtime.     topiramate 25 MG tablet  Commonly known as:  TOPAMAX  Take 25 mg by mouth daily.     traZODone 50 MG tablet  Commonly known as:  DESYREL  Take 50-100 mg by mouth at bedtime as needed for sleep.     Venlafaxine HCl 150 MG Tb24  Take 1 tablet by mouth daily.        Allergies:  Allergies  Allergen Reactions  . Prednisone     Patient reported this date that she was allergic to PCN, rather than Prednisone.  Patient reported this date that she was allergic to PCN, rather than Prednisone.   Sarina Ill [Bactrim] Itching  . Sulfasalazine Swelling    "swelling of face"  . Penicillins Rash    Rash Rash  . Sulfa Antibiotics Rash  . Sulfamethoxazole-Trimethoprim Rash    Family History: Family History  Problem Relation Age of Onset  . Fibromyalgia Daughter   . Breast cancer Neg Hx     Social History:  reports that she has never smoked. She has never used smokeless tobacco. She reports that she does not drink alcohol or use illicit drugs.  ROS: UROLOGY Frequent Urination?: Yes Hard to postpone urination?:  Yes Burning/pain with urination?: Yes Get up at night to urinate?: No Leakage of urine?: Yes Urine stream starts and stops?: No Trouble starting stream?: No Do you have to strain to urinate?: No Blood in urine?: No Urinary tract infection?: No Sexually transmitted disease?: No Injury to kidneys or bladder?: No Painful intercourse?: No Weak stream?: Yes Currently  pregnant?: No Vaginal bleeding?: No Last menstrual period?: n  Gastrointestinal Nausea?: No Vomiting?: No Indigestion/heartburn?: No Diarrhea?: No Constipation?: No  Constitutional Fever: No Night sweats?: No Weight loss?: No Fatigue?: No  Skin Skin rash/lesions?: No Itching?: No  Eyes Blurred vision?: Yes Double vision?: No  Ears/Nose/Throat Sore throat?: No Sinus problems?: No  Hematologic/Lymphatic Swollen glands?: No Easy bruising?: No  Cardiovascular Leg swelling?: Yes Chest pain?: No  Respiratory Cough?: No Shortness of breath?: No  Endocrine Excessive thirst?: No  Musculoskeletal Back pain?: No Joint pain?: Yes  Neurological Headaches?: No Dizziness?: No  Psychologic Depression?: No Anxiety?: No  Physical Exam: BP 123/71 mmHg  Pulse 82  Ht 4\' 9"  (1.448 m)  Wt 109 lb (49.442 kg)  BMI 23.58 kg/m2  Constitutional:  Alert and oriented, No acute distress. HEENT: [place] AT, moist mucus membranes.  Trachea midline, no masses. Cardiovascular: No clubbing, cyanosis, or edema. Respiratory: Normal respiratory effort, no increased work of breathing. Skin: No rashes, bruises or suspicious lesions. Neurologic: Grossly intact, no focal deficits, moving all 4 extremities. Psychiatric: Normal mood and affect.  Laboratory Data: Urinalysis Results for orders placed or performed in visit on 10/13/15  CULTURE, URINE COMPREHENSIVE  Result Value Ref Range   Urine Culture, Comprehensive Preliminary report (A)    Result 1 Comment (A)    Result 2 Gram negative rods (A)   Microscopic  Examination  Result Value Ref Range   WBC, UA >30 (H) 0 -  5 /hpf   RBC, UA >30 (H) 0 -  2 /hpf   Epithelial Cells (non renal) 0-10 0 - 10 /hpf   Bacteria, UA Few (A) None seen/Few  Urinalysis, Complete  Result Value Ref Range   Specific Gravity, UA 1.010 1.005 - 1.030   pH, UA 7.0 5.0 - 7.5   Color, UA Yellow Yellow   Appearance Ur Cloudy (A) Clear   Leukocytes, UA 2+ (A) Negative   Protein, UA 2+ (A) Negative/Trace   Glucose, UA Negative Negative   Ketones, UA Negative Negative   RBC, UA 3+ (A) Negative   Bilirubin, UA Negative Negative   Urobilinogen, Ur 0.2 0.2 - 1.0 mg/dL   Nitrite, UA Negative Negative   Microscopic Examination See below:   BLADDER SCAN AMB NON-IMAGING  Result Value Ref Range   Scan Result 91ml    Pertinent Imaging: Results for MARIESSA, ELZEY (MRN [id number]) as of 10/15/2015 22:17  Ref. Range 10/13/2015 11:11  Scan Result Unknown 66ml    Assessment & Plan:   1. Malignant neoplasm of overlapping sites of bladder [facility])-  patient has an upcoming surveillance cystoscopy scheduled.  She is encouraged to keep that appointment.  2. Dysuria:   Patient's UA is suspicious for infection.  I will send it for culture.  I have given her Uribel samples for her discomfort.  - Urinalysis, Complete  3. Frequency:  See above.    Return for Keep cystoscopic appointment.  These notes generated with voice recognition software. I apologize for typographical errors.  Zara Council, [facility] [address], [place], [place] [address] [phone]

## 2015-10-15 DIAGNOSIS — R350 Frequency of micturition: Secondary | ICD-10-CM | POA: Insufficient documentation

## 2015-10-15 DIAGNOSIS — R300 Dysuria: Secondary | ICD-10-CM | POA: Insufficient documentation

## 2015-10-16 ENCOUNTER — Telehealth: Payer: Self-pay

## 2015-10-16 DIAGNOSIS — N390 Urinary tract infection, site not specified: Secondary | ICD-10-CM

## 2015-10-16 LAB — CULTURE, URINE COMPREHENSIVE

## 2015-10-16 MED ORDER — NITROFURANTOIN MONOHYD MACRO 100 MG PO CAPS: 100.0000 mg | ORAL_CAPSULE | Freq: Two times a day (BID) | ORAL | Status: AC

## 2015-10-16 NOTE — Telephone Encounter (Signed)
-----   Message from Nori Riis, PA-C sent at 10/16/2015  4:55 PM EDT ----- Patient has an UTI.  She needs to start Macrobid 100 mg, one capsule twice daily, #14.   She will then need to have her urine rechecked after completing the antibiotic.

## 2015-10-16 NOTE — Telephone Encounter (Signed)
Spoke with pt in reference to +ucx. Pt stated that PCP started her on cipro already. Made aware urine needs to be rechecked once abx are completed. Pt voiced understanding. Pt will call back.

## 2015-10-21 DIAGNOSIS — G2581 Restless legs syndrome: Secondary | ICD-10-CM | POA: Insufficient documentation

## 2015-10-31 ENCOUNTER — Telehealth: Payer: Self-pay | Admitting: Urology

## 2015-10-31 NOTE — Telephone Encounter (Signed)
We really need a CATH UA to confirm an infection is still present.  The resistance pattern or organism may have changed.

## 2015-10-31 NOTE — Telephone Encounter (Signed)
Spoke with pt in reference to cath specimen. Pt stated that she is having severe dysuria and not sure she can make it through the weekend. Pt then stated she knows itll take 3 days to get results and she doesn't think she can make it that long. Reinforced with pt she could take immodium and macrobid. Pt stated that she has but that doesn't seem to help. Pt then stated that she will just do what she can and call back sometime next week if she still needs [place].

## 2015-10-31 NOTE — Telephone Encounter (Signed)
Patient finished her week of Cipro that her PCP prescribed to her.  She started to feel symptoms of the UTI again, so she started the Macrobid that [facility] prescribed to her.  The Macrobid upsets her stomach and she is calling to see if there is another abx that we can call in for her at the [facility] for her.

## 2015-11-03 ENCOUNTER — Ambulatory Visit (INDEPENDENT_AMBULATORY_CARE_PROVIDER_SITE_OTHER): Payer: Medicare Other

## 2015-11-03 DIAGNOSIS — N390 Urinary tract infection, site not specified: Secondary | ICD-10-CM | POA: Diagnosis not present

## 2015-11-03 NOTE — Progress Notes (Signed)
In and Out Catheterization  Patient is present today for a I & O catheterization due to post abx dysuria. Patient was cleaned and prepped in a sterile fashion with betadine and Lidocaine 2% jelly was instilled into the urethra.  A 14FR cath was inserted no complications were noted , 86ml of urine return was noted, urine was yellow in color. A clean urine sample was collected for u/a and cx. Bladder was drained  And catheter was removed with out difficulty.    Preformed by: Toniann Fail, LPN

## 2015-11-07 ENCOUNTER — Telehealth: Payer: Self-pay

## 2015-11-07 LAB — CULTURE, URINE COMPREHENSIVE

## 2015-11-07 NOTE — Telephone Encounter (Signed)
Spoke with pt in reference to -ucx. Pt stated that she does not have any uribel but has been taking AZO. Therefore some will be left up front. Pt did state that today she is feeling better but the burning comes and goes from day to day. Reinforced with pt to take the uribel and with a full glass of water. Pt voiced understanding. Pt has a cysto appt next week.

## 2015-11-07 NOTE — Telephone Encounter (Signed)
-----   Message from Nori Riis, PA-C sent at 11/07/2015  8:16 AM EDT ----- Patient's urine culture is negative.  Has patient tried Uribel for the dysuria?

## 2015-11-11 ENCOUNTER — Telehealth (HOSPITAL_COMMUNITY): Payer: Self-pay

## 2015-11-11 NOTE — Telephone Encounter (Signed)
Called to reschedule angio, pt stated that she is still dealing with bladder cancer and does not want to do angio this year. She will call if anything changes. AW

## 2015-11-14 ENCOUNTER — Encounter: Payer: Self-pay | Admitting: Urology

## 2015-11-14 ENCOUNTER — Ambulatory Visit (INDEPENDENT_AMBULATORY_CARE_PROVIDER_SITE_OTHER): Payer: Medicare Other | Admitting: Urology

## 2015-11-14 VITALS — BP 135/58 | HR 68 | Ht <= 58 in | Wt 109.0 lb

## 2015-11-14 DIAGNOSIS — C679 Malignant neoplasm of bladder, unspecified: Secondary | ICD-10-CM | POA: Diagnosis not present

## 2015-11-14 DIAGNOSIS — N3281 Overactive bladder: Secondary | ICD-10-CM | POA: Diagnosis not present

## 2015-11-14 DIAGNOSIS — R300 Dysuria: Secondary | ICD-10-CM | POA: Diagnosis not present

## 2015-11-14 LAB — URINALYSIS, COMPLETE
Bilirubin, UA: NEGATIVE
Glucose, UA: NEGATIVE
Ketones, UA: NEGATIVE
Nitrite, UA: NEGATIVE
Specific Gravity, UA: 1.02 (ref 1.005–1.030)
Urobilinogen, Ur: 0.2 mg/dL (ref 0.2–1.0)
pH, UA: 6 (ref 5.0–7.5)

## 2015-11-14 LAB — MICROSCOPIC EXAMINATION: Bacteria, UA: NONE SEEN

## 2015-11-14 MED ORDER — LIDOCAINE HCL 2 % EX GEL
1.0000 "application " | Freq: Once | CUTANEOUS | Status: AC
  Administered 2015-11-14: 1 via URETHRAL

## 2015-11-14 MED ORDER — CIPROFLOXACIN HCL 500 MG PO TABS
500.0000 mg | ORAL_TABLET | Freq: Once | ORAL | Status: AC
  Administered 2015-11-14: 500 mg via ORAL

## 2015-11-14 NOTE — Progress Notes (Signed)
5:28 PM   11/14/2015   Jodi Colon 19-Aug-1929 [id number]  Referring provider: Tracie Harrier, MD [address] [facility] [place], [place] [address]  Chief Complaint  Patient presents with  . Cysto    HPI: Patient is an 82 year old Caucasian female with history of recurrent bladder cancer who returns today after induction BCG for cystoscopy.    Mrs. Mccarey is  previously a patient of Dr. Bernardo Heater at [facility] followed by Dr. Elnoria [facility]). She was diagnosed with high-grade TA TCC of the bladder in 2011. She underwent induction BCG 6 and briefly on maintenance. She then had an abnormal urine cytology on September 2014 which was consistent with CIS. She then underwent induction with only 4 treatments but did not complete the full course due to a nationwide backorder of the medication. She developed another recurrence and returned to the OR on 05/20/15 for cystoscopy, bilateral retrograde pyelogram (normal) and bladder biopsy x 6 performed by Dr. Erlene Quan. 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  She completed another induction series of BCG on 08/14/2015 x 6.   She does have severe baseline symptoms of overactive bladder, incontinence, and severe persistent dysuria. Per notes, she is been tried on DDAVP and ?botox.  She is currently Uribell and mybetriq  which do not help much with her symptoms. Her quality of life is declined greatly due to her severe urinary symptoms.  She had numerous UA/urine cultures most of which grown no bacteria.  She was treated for an Escherichia coli urinary tract infection on 10/16/2015 most recently.  Recent culture was subsequently negative.  Incidentally, she did have a recent CT abdomen pelvis with contrast in 08/2015 which showed no evidence of metastatic these seeds. Her bladder wall was thickened.  PMH: Past Medical History  Diagnosis Date  . Degenerative joint disease     [facility] by [facility]  .  Hypertension   . Hyperlipidemia   . MRSA (methicillin resistant Staphylococcus aureus) 2008    perineal abscess  . Spinal stenosis   . DCIS (ductal carcinoma in situ) of breast 2007    Right, s/p lumpectomy/xrt   . Cerebrovascular disease 2008    s/p PTCA/stent of MCA, [place]  . History of MRSA infection   . Stroke ([place])   . Arthritis   . GERD (gastroesophageal reflux disease)   . Urge incontinence   . Nocturia   . Thyroid disease   . DDD (degenerative disc disease), cervical   . Incomplete bladder emptying   . Adnexal cyst   . Chronic kidney disease   . History of hiatal hernia   . Headache   . History of TIA (transient ischemic attack)   . Hx MRSA infection   . Brain aneurysm     small, followed with regular angiograms, Dr. Estanislado Pandy  . Bladder cancer ([place]) 04/2015  . 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 ([place])   . Breast cancer [facility]) 2007    Right Breast Lumpectomy  . Radiation 2007    BREAST CA    Surgical History: Past Surgical History  Procedure Laterality Date  . Cholecystectomy    . Colonoscopy  2013  . Hemorrhoid surgery  02/01/13  . Cataract extraction    . Tonsillectomy    . Breast lumpectomy    . Laparoscopic hysterectomy    . Abdominal hysterectomy    . Back surgery    . Fracture surgery Left     Ankle, compound fracture  .  Eye surgery Bilateral     Cataract Extraction  . Bladder surgery  2012, 2014  . Brain surgery Right 2008    stent, [facility], [place]  . Cystoscopy with biopsy N/A 05/20/2015    Procedure: CYSTOSCOPY WITH BIOPSY;  Surgeon: Hollice Espy, MD;  Location: [facility] ORS;  Service: Urology;  Laterality: N/A;  . Cystoscopy w/ retrogrades Bilateral 05/20/2015    Procedure: CYSTOSCOPY WITH RETROGRADE PYELOGRAM;  Surgeon: Hollice Espy, MD;  Location: [facility] ORS;  Service: Urology;  Laterality: Bilateral;    Home Medications:    Medication List       This list is accurate as of: 11/14/15  5:28 PM.  Always use  your most recent med list.               amLODipine 5 MG tablet  Commonly known as:  NORVASC  Take 5 mg by mouth daily.     aspirin 81 MG tablet  Take 81 mg by mouth daily.     atorvastatin 20 MG tablet  Commonly known as:  LIPITOR  Take 20 mg by mouth every morning.     ferrous sulfate 325 (65 FE) MG EC tablet  Take 325 mg by mouth daily with breakfast.     gabapentin 100 MG capsule  Commonly known as:  NEURONTIN  Take 200 mg by mouth every morning. Take 400 mg at bedtime     HYDROcodone-acetaminophen 7.5-325 MG tablet  Commonly known as:  NORCO  Take 1 tablet by mouth 3 (three) times daily as needed for moderate pain.     latanoprost 0.005 % ophthalmic solution  Commonly known as:  XALATAN     levothyroxine 50 MCG tablet  Commonly known as:  SYNTHROID, LEVOTHROID  Take 50-75 mcg by mouth See admin instructions. Take one tablet by mouth daily except on sundays take one and one half by mouth. Per Med record     lisinopril 40 MG tablet  Commonly known as:  PRINIVIL,ZESTRIL  Take 1 tablet (40 mg total) by mouth daily.     multivitamin tablet  Take 1 tablet by mouth daily.     PROTONIX 40 MG tablet  Generic drug:  pantoprazole  Take 40 mg by mouth 2 (two) times daily.     timolol 0.5 % ophthalmic solution  Commonly known as:  TIMOPTIC     tiZANidine 2 MG tablet  Commonly known as:  ZANAFLEX  Take 2 mg by mouth at bedtime.     topiramate 25 MG tablet  Commonly known as:  TOPAMAX  Take 25 mg by mouth daily.     traZODone 50 MG tablet  Commonly known as:  DESYREL  Take 50-100 mg by mouth at bedtime as needed for sleep.     Venlafaxine HCl 150 MG Tb24  Take 1 tablet by mouth daily.        Allergies:  Allergies  Allergen Reactions  . Prednisone     Patient reported this date that she was allergic to PCN, rather than Prednisone.  Patient reported this date that she was allergic to PCN, rather than Prednisone.   Sarina Ill [Bactrim] Itching  .  Sulfasalazine Swelling    "swelling of face"  . Penicillins Rash    Rash Rash  . Sulfa Antibiotics Rash  . Sulfamethoxazole-Trimethoprim Rash    Family History: Family History  Problem Relation Age of Onset  . Fibromyalgia Daughter   . Breast cancer Neg Hx     Social History:  reports  that she has never smoked. She has never used smokeless tobacco. She reports that she does not drink alcohol or use illicit drugs.   Physical Exam: BP 135/58 mmHg  Pulse 68  Ht 4\' 9"  (1.448 m)  Wt 109 lb (49.442 kg)  BMI 23.58 kg/m2  Constitutional:  Alert and oriented, No acute distress. HEENT: [place] AT, moist mucus membranes.  Trachea midline, no masses. Cardiovascular: No clubbing, cyanosis, or edema. Respiratory: Normal respiratory effort, no increased work of breathing. Skin: No rashes, bruises or suspicious lesions. GU: Atrophic external genitalia. Urethral need is somewhat retracted, difficult to visualize. Neurologic: Grossly intact, no focal deficits, moving all 4 extremities. Psychiatric: Normal mood and affect.  Laboratory Data: Comprehensive Metabolic Panel (CMP) (Q000111Q 10:43 AM) Comprehensive Metabolic Panel (CMP) (Q000111Q 10:43 AM)  Component Value Ref Range  Glucose 84 70 - 110 mg/dL  Sodium 133 (L) 136 - 145 mmol/L  Potassium 4.3 3.6 - 5.1 mmol/L  Chloride 98 97 - 109 mmol/L  Carbon Dioxide (CO2) 23.5 22.0 - 32.0 mmol/L  Urea Nitrogen (BUN) 19 7 - 25 mg/dL  Creatinine 0.9 0.6 - 1.1 mg/dL   CBC w/auto Differential (5 Part) (10/14/2015 10:43 AM) CBC w/auto Differential (5 Part) (10/14/2015 10:43 AM)  Component Value Ref Range  WBC (White Blood Cell Count) 6.0 4.1 - 10.2 10^3/uL  RBC (Red Blood Cell Count) 3.67 (L) 4.04 - 5.48 10^6/uL  Hemoglobin 11.5 (L) 12.0 - 15.0 gm/dL  Hematocrit 34.4 (L) 35.0 - 47.0 %  MCV (Mean Corpuscular Volume) 93.7 80.0 - 100.0 fl  MCH (Mean Corpuscular Hemoglobin) 31.3 (H) 27.0 - 31.2 pg  MCHC (Mean Corpuscular Hemoglobin  Concentration) 33.4 32.0 - 36.0 gm/dL  Platelet Count 259 150 - 450 10^3/uL      Urinalysis Results for orders placed or performed in visit on 11/14/15  Microscopic Examination  Result Value Ref Range   WBC, UA 6-10 (A) 0 -  5 /hpf   RBC, UA 11-30 (A) 0 -  2 /hpf   Epithelial Cells (non renal) 0-10 0 - 10 /hpf   Bacteria, UA None seen None seen/Few  Urinalysis, Complete  Result Value Ref Range   Specific Gravity, UA 1.020 1.005 - 1.030   pH, UA 6.0 5.0 - 7.5   Color, UA Yellow Yellow   Appearance Ur Hazy (A) Clear   Leukocytes, UA Trace (A) Negative   Protein, UA 1+ (A) Negative/Trace   Glucose, UA Negative Negative   Ketones, UA Negative Negative   RBC, UA 1+ (A) Negative   Bilirubin, UA Negative Negative   Urobilinogen, Ur 0.2 0.2 - 1.0 mg/dL   Nitrite, UA Negative Negative   Microscopic Examination See below:    Pertinent Imaging: Study Result     CLINICAL DATA: Right lower extremity swelling for 4 weeks, current history of bladder carcinoma, nausea and weightloss due to chemotherapy  EXAM: CT ABDOMEN AND PELVIS WITH CONTRAST  TECHNIQUE: Multidetector CT imaging of the abdomen and pelvis was performed using the standard protocol following bolus administration of intravenous contrast.  CONTRAST: 65mL ISOVUE-300 IOPAMIDOL (ISOVUE-300) INJECTION 61%  COMPARISON: None.  FINDINGS: Lower chest: Visualized portions of the lung bases are clear. There is moderate distal esophageal wall thickening. There is small pericardial effusion.  Hepatobiliary: No significant hepatic abnormalities. Status post cholecystectomy.  Pancreas: Normal  Spleen: 8 mm low-attenuation lesion in the dome of the spleen, nonspecific. The majority of splenic lesions are benign.  Adrenals/Urinary Tract: The adrenal glands and kidneys are normal. There are  extrarenal pelvises bilaterally without hydronephrosis. There is significant bladder wall thickening. The bladder  is decompressed which may exaggerate wall thickness, but the wall measures 16 mm and shows in enhancement along its interior border.  Stomach/Bowel: There is density centrally within the rectum. This could represent passed contrast, calcification, or postsurgical change. There is diverticulosis of the sigmoid colon. There is no evidence of diverticulitis. There is stool throughout the large bowel. The appendix is normal. Small bowel and stomach are normal.  Vascular/Lymphatic: There is significant atherosclerotic aortoiliac calcification. There is no significant adenopathy in the abdomen or pelvis.  Reproductive: Uterus appears to be absent. There is a simple appearing cystic oval structure in the right adnexa measuring 18 mm. this may be related to the right ovary.  Other: There is no free fluid. Inferior vena cava and iliac veins appear normal. There is no evidence of mass along the course of the right iliac vein. No significant inguinal adenopathy or mass.  Musculoskeletal: There are no acute musculoskeletal findings.  IMPRESSION: No findings to account for right lower extremity swelling.  Evidence of esophagitis. Significant bladder wall thickening consistent with history of bladder carcinoma.  Numerous nonacute findings described above. These include findings in the spleen, colon, and right adnexa.   Electronically Signed  By: Skipper Cliche M.D.  On: 09/02/2015 16:56      Cystoscopy Procedure Note  Patient identification was confirmed, informed consent was obtained, and patient was prepped using Betadine solution.  Lidocaine jelly was administered per urethral meatus.    Preoperative abx where received prior to procedure.    Procedure: - Flexible cystoscope introduced, without any difficulty.   - Thorough search of the bladder revealed:    normal urethral meatus    Urothelium highly irregular, diffuse patchy erythema throughout entire bladder.  Some areas of early papillary change highly concerning for CIS/more aggressive recurrence.    no stones    no ulcers     no tumors    no urethral polyps    no trabeculation  - Ureteral orifices were normal in position and appearance.  Post-Procedure: - Patient tolerated the procedure well   Assessment & Plan:   1. Malignant neoplasm of overlapping sites of bladder ([place])-  Long history of recurrent high-grade bladder cancer/CIS status post at least 2 full induction courses of BCG along with a partial induction course who appears to have extensive residual disease highly suspicious for BCG failure on cystoscopy today. Urine cytology sent which will help confirm this.  No evidence of metastatic diease on recent CT abd/ pelvis.    Length the discussion today with the patient and her son about various options moving forward. Unfortunately, given her age and comorbidities, she may not be a surgical candidate for cystectomy which would be the standard of care for BCG failure/ recurrent CIS.  Additionally, given her severe urinary symptoms, I do not feel that she would do well with chemoradiation.    At this point, I would like her to consider referral to acedemic center such as [facility] to at least discuss the possibility of cystectomy versus enrollment into clinical trial, etc.   I explained that she will likely eventually need a confirmatory biopsy to document BCG failure.  She and her son are agreeable with this plan.  All of their questions were answered in detail.  2. Dysuria:    Suspect this is more likely related to underlying bladder cancer, bladder irritation/inflammation.  Continue supportive care with Mybetriq and Uribell.  3.  Frequency:  See above.    Return for referral to [facility] (urooncology).   Hollice Espy, MD  [facility] [address] [place], [place] [address] [phone]

## 2015-11-21 ENCOUNTER — Other Ambulatory Visit: Payer: Self-pay | Admitting: Urology

## 2015-12-10 LAB — URINALYSIS, COMPLETE
Bilirubin, UA: NEGATIVE
Glucose, UA: NEGATIVE
Ketones, UA: NEGATIVE
Nitrite, UA: NEGATIVE
Specific Gravity, UA: 1.015 (ref 1.005–1.030)
Urobilinogen, Ur: 0.2 mg/dL (ref 0.2–1.0)
pH, UA: 7 (ref 5.0–7.5)

## 2015-12-10 LAB — MICROSCOPIC EXAMINATION: RBC, UA: >30 /hpf — AB (ref 0–?)

## 2015-12-15 ENCOUNTER — Telehealth: Payer: Self-pay | Admitting: Urology

## 2015-12-15 ENCOUNTER — Telehealth: Payer: Self-pay | Admitting: Radiology

## 2015-12-15 NOTE — Telephone Encounter (Signed)
Discussed patient with Dr. Carla Drape at [facility]. She was seen last week in the office. They feel that she is not a great surgical candidate given her age and history of cerebrovascular disease for cystectomy. She will need a repeat biopsy as previously discussed to confirm her BCG failure. She is most interested in having it done locally which I'm happy to perform.    Discussed with Dr. Ricky Ala that if this is recurrence of BCG, he would recommend second Intravesical therapy. If muscle invasive, he will reconsider cystectomy at that time.  Hollice Espy, MD

## 2015-12-16 ENCOUNTER — Ambulatory Visit (INDEPENDENT_AMBULATORY_CARE_PROVIDER_SITE_OTHER): Payer: Medicare Other | Admitting: Urology

## 2015-12-16 ENCOUNTER — Encounter: Payer: Self-pay | Admitting: Urology

## 2015-12-16 VITALS — BP 148/65 | HR 75 | Ht <= 58 in | Wt 107.0 lb

## 2015-12-16 DIAGNOSIS — Z87448 Personal history of other diseases of urinary system: Secondary | ICD-10-CM | POA: Diagnosis not present

## 2015-12-16 DIAGNOSIS — R300 Dysuria: Secondary | ICD-10-CM | POA: Diagnosis not present

## 2015-12-16 DIAGNOSIS — Z8603 Personal history of neoplasm of uncertain behavior: Secondary | ICD-10-CM

## 2015-12-16 NOTE — Telephone Encounter (Signed)
Notified pt of surgery scheduled 12/23/15, pre-admit testing appt on 12/18/15 @11 :00 & to call day prior to surgery for arrival time to SDS. Pt voices understanding.

## 2015-12-16 NOTE — Progress Notes (Signed)
10:22 AM   12/16/2015   Jodi Colon March 08, 1930 [id number]  Referring provider: Tracie Harrier, MD [address] [facility] [place], [place] [address]  Chief Complaint  Patient presents with  . Bladder Cancer    discuss surgery    HPI: 82 year old Caucasian female with history of recurrent bladder cancer who returns today For follow-up after recent evaluation at [facility] urology for consideration of cystectomy after cystoscopy is worrisome for another BCG failure.    Jodi Colon is  previously a patient of Dr. Bernardo Colon at [facility] followed by Dr. Elnoria Colon [facility]). She was diagnosed with high-grade TA TCC of the bladder in 2011. She underwent induction BCG 6 and briefly on maintenance. She then had an abnormal urine cytology on September 2014 which was consistent with CIS. She then underwent induction with only 4 treatments but did not complete the full course due to a nationwide backorder of the medication. She developed another recurrence and returned to the OR on 05/20/15 for cystoscopy, bilateral retrograde pyelogram (normal) and bladder biopsy x 6 performed by Dr. Erlene Colon. 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  She completed another induction series of BCG on 08/14/2015 x 6.  Of note, this course was very poorly tolerated.  She does have severe baseline symptoms of overactive bladder, incontinence, and severe persistent dysuria. Per notes, she is been tried on DDAVP and ?botox.  She is currently Uribell and mybetriq  which do not help much with her symptoms. Her quality of life is declined greatly due to her severe urinary symptoms.  She had numerous UA/urine cultures most of which grown no bacteria.  She was treated for an Escherichia coli urinary tract infection on 10/16/2015 most recently.  Recent culture was subsequently negative.  Incidentally, she did have a recent CT abdomen pelvis with contrast in 08/2015 which showed no evidence of metastatic  these seeds. Her bladder wall was thickened.  Most recent cystoscopy on 11/14/2015 showed highly irregular urothelium with patchy diffuse erythema and some early papillary changes concerning for persistent/recurrent CIS and perhaps further progression.     She was seen in consultation at [facility] urology by Jodi Colon for consideration for possible cystectomy given concern for BCG failure and multiple previous failures. I discussed the case with him and he has concerns that she would not tolerate cystectomy given her history of stroke and overall comorbidities. He has recommended confirmation of recurrence with cystoscopy, bladder biopsy, bilateral retrogrades.  She would like to have this procedure done locally in [place].  PMH: Past Medical History  Diagnosis Date  . Degenerative joint disease     [facility] by the [facility]  . Hypertension   . Hyperlipidemia   . MRSA (methicillin resistant Staphylococcus aureus) 2008    perineal abscess  . Spinal stenosis   . DCIS (ductal carcinoma in situ) of breast 2007    Right, s/p lumpectomy/xrt   . Cerebrovascular disease 2008    s/p PTCA/stent of MCA, Deveshwar  . History of MRSA infection   . Stroke ([place])   . Arthritis   . GERD (gastroesophageal reflux disease)   . Urge incontinence   . Nocturia   . Thyroid disease   . DDD (degenerative disc disease), cervical   . Incomplete bladder emptying   . Adnexal cyst   . Chronic kidney disease   . History of hiatal hernia   . Headache   . History of TIA (transient ischemic attack)   . Hx MRSA infection   .  Brain aneurysm     small, followed with regular angiograms, Dr. Estanislado Colon  . Bladder cancer ([place]) 04/2015  . 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 ([place])   . Breast cancer [facility]) 2007    Right Breast Lumpectomy  . Radiation 2007    BREAST CA    Surgical History: Past Surgical History  Procedure Laterality Date  . Cholecystectomy    . Colonoscopy  2013  .  Hemorrhoid surgery  02/01/13  . Cataract extraction    . Tonsillectomy    . Breast lumpectomy    . Laparoscopic hysterectomy    . Abdominal hysterectomy    . Back surgery    . Fracture surgery Left     Ankle, compound fracture  . Eye surgery Bilateral     Cataract Extraction  . Bladder surgery  2012, 2014  . Brain surgery Right 2008    stent, Jodi Colon, [place]  . Cystoscopy with biopsy N/A 05/20/2015    Procedure: CYSTOSCOPY WITH BIOPSY;  Surgeon: Jodi Espy, MD;  Location: [facility] ORS;  Service: Urology;  Laterality: N/A;  . Cystoscopy w/ retrogrades Bilateral 05/20/2015    Procedure: CYSTOSCOPY WITH RETROGRADE PYELOGRAM;  Surgeon: Jodi Espy, MD;  Location: [facility] ORS;  Service: Urology;  Laterality: Bilateral;    Home Medications:    Medication List       This list is accurate as of: 12/16/15 10:22 AM.  Always use your most recent med list.               amLODipine 5 MG tablet  Commonly known as:  NORVASC  Take 5 mg by mouth daily.     aspirin 81 MG tablet  Take 81 mg by mouth daily.     atorvastatin 20 MG tablet  Commonly known as:  LIPITOR  Take 20 mg by mouth every morning.     ferrous sulfate 325 (65 FE) MG EC tablet  Take 325 mg by mouth daily with breakfast.     gabapentin 100 MG capsule  Commonly known as:  NEURONTIN  Take 200 mg by mouth every morning. Take 400 mg at bedtime     HYDROcodone-acetaminophen 7.5-325 MG tablet  Commonly known as:  NORCO  Take 1 tablet by mouth 3 (three) times daily as needed for moderate pain.     latanoprost 0.005 % ophthalmic solution  Commonly known as:  XALATAN     levothyroxine 50 MCG tablet  Commonly known as:  SYNTHROID, LEVOTHROID  Take 50-75 mcg by mouth See admin instructions. Take one tablet by mouth daily except on sundays take one and one half by mouth. Per Med record     lisinopril 40 MG tablet  Commonly known as:  PRINIVIL,ZESTRIL  Take 1 tablet (40 mg total) by mouth daily.     multivitamin  tablet  Take 1 tablet by mouth daily.     PROTONIX 40 MG tablet  Generic drug:  pantoprazole  Take 40 mg by mouth 2 (two) times daily.     timolol 0.5 % ophthalmic solution  Commonly known as:  TIMOPTIC     tiZANidine 2 MG tablet  Commonly known as:  ZANAFLEX  Take 2 mg by mouth at bedtime.     topiramate 25 MG tablet  Commonly known as:  TOPAMAX  Take 25 mg by mouth daily.     traZODone 50 MG tablet  Commonly known as:  DESYREL  Take 50-100 mg by mouth at bedtime as needed for sleep.  Venlafaxine HCl 150 MG Tb24  Take 1 tablet by mouth daily.        Allergies:  Allergies  Allergen Reactions  . Prednisone     Patient reported this date that she was allergic to PCN, rather than Prednisone.  Patient reported this date that she was allergic to PCN, rather than Prednisone.   Sarina Ill [Bactrim] Itching  . Sulfasalazine Swelling    "swelling of face"  . Penicillins Rash    Rash Rash  . Sulfa Antibiotics Rash  . Sulfamethoxazole-Trimethoprim Rash    Family History: Family History  Problem Relation Age of Onset  . Fibromyalgia Daughter   . Breast cancer Neg Hx     Social History:  reports that she has never smoked. She has never used smokeless tobacco. She reports that she does not drink alcohol or use illicit drugs.   Physical Exam: BP 148/65 mmHg  Pulse 75  Ht 4\' 9"  (1.448 m)  Wt 107 lb (48.535 kg)  BMI 23.15 kg/m2  Constitutional:  Alert and oriented, No acute distress. HEENT: [place] AT, moist mucus membranes.  Trachea midline, no masses. Cardiovascular: No clubbing, cyanosis, or edema. RRR. Respiratory: Normal respiratory effort, no increased work of breathing. CTAB. Skin: No rashes, bruises or suspicious lesions. Neurologic: Grossly intact, no focal deficits, moving all 4 extremities. Psychiatric: Normal mood and affect.  Laboratory Data: Comprehensive Metabolic Panel (CMP) (Q000111Q 10:43 AM) Comprehensive Metabolic Panel (CMP) (Q000111Q 10:43  AM)  Component Value Ref Range  Glucose 84 70 - 110 mg/dL  Sodium 133 (L) 136 - 145 mmol/L  Potassium 4.3 3.6 - 5.1 mmol/L  Chloride 98 97 - 109 mmol/L  Carbon Dioxide (CO2) 23.5 22.0 - 32.0 mmol/L  Urea Nitrogen (BUN) 19 7 - 25 mg/dL  Creatinine 0.9 0.6 - 1.1 mg/dL   CBC w/auto Differential (5 Part) (10/14/2015 10:43 AM) CBC w/auto Differential (5 Part) (10/14/2015 10:43 AM)  Component Value Ref Range  WBC (White Blood Cell Count) 6.0 4.1 - 10.2 10^3/uL  RBC (Red Blood Cell Count) 3.67 (L) 4.04 - 5.48 10^6/uL  Hemoglobin 11.5 (L) 12.0 - 15.0 gm/dL  Hematocrit 34.4 (L) 35.0 - 47.0 %  MCV (Mean Corpuscular Volume) 93.7 80.0 - 100.0 fl  MCH (Mean Corpuscular Hemoglobin) 31.3 (H) 27.0 - 31.2 pg  MCHC (Mean Corpuscular Hemoglobin Concentration) 33.4 32.0 - 36.0 gm/dL  Platelet Count 259 150 - 450 10^3/uL     Urinalysis Results for orders placed or performed in visit on 11/14/15  Microscopic Examination  Result Value Ref Range   WBC, UA 6-10 (A) 0 -  5 /hpf   RBC, UA 11-30 (A) 0 -  2 /hpf   Epithelial Cells (non renal) 0-10 0 - 10 /hpf   Bacteria, UA None seen None seen/Few  Urinalysis, Complete  Result Value Ref Range   Specific Gravity, UA 1.020 1.005 - 1.030   pH, UA 6.0 5.0 - 7.5   Color, UA Yellow Yellow   Appearance Ur Hazy (A) Clear   Leukocytes, UA Trace (A) Negative   Protein, UA 1+ (A) Negative/Trace   Glucose, UA Negative Negative   Ketones, UA Negative Negative   RBC, UA 1+ (A) Negative   Bilirubin, UA Negative Negative   Urobilinogen, Ur 0.2 0.2 - 1.0 mg/dL   Nitrite, UA Negative Negative   Microscopic Examination See below:     Assessment & Plan:   1. Malignant neoplasm of overlapping sites of bladder [facility])-  Long history of recurrent high-grade bladder cancer/CIS  status post at least 2 full induction courses of BCG along with a partial induction course who appears to have extensive residual disease highly suspicious for BCG failure on cystoscopy 10/2015.  Urine cytology negative.  No evidence of metastatic diease on recent CT abd/ pelvis.    Recent consult at [facility] urology confirms that she is not an ideal candidate for cystectomy.  As such, I have recommended returning to the operating room for cystoscopy, bladder biopsy, bilateral retrograde pyelogram to confirm or refute BCG failure. Should this show persistent CIS, we will arrange for second line intravesical therapy.  She is agreeable with this plan.  All of their questions were answered in detail.  She is familiar with the procedure and risk as she is having this procedure multiple times in the past.  2. Dysuria:    Suspect this is more likely related to underlying bladder cancer, bladder irritation/inflammation.  Continue supportive care with Mybetriq and Uribell.  3. Frequency:  See above.    Schedule surgery as above  Jodi Espy, MD  [facility] [address] [place], [place] [address] [phone]

## 2015-12-18 ENCOUNTER — Ambulatory Visit
Admission: RE | Admit: 2015-12-18 | Discharge: 2015-12-18 | Disposition: A | Discharge status: Home / Self Care | Payer: Medicare Other | Source: Ambulatory Visit | Attending: Urology | Admitting: Urology

## 2015-12-18 DIAGNOSIS — I10 Essential (primary) hypertension: Secondary | ICD-10-CM | POA: Insufficient documentation

## 2015-12-18 DIAGNOSIS — I452 Bifascicular block: Secondary | ICD-10-CM | POA: Diagnosis not present

## 2015-12-18 DIAGNOSIS — Z01810 Encounter for preprocedural cardiovascular examination: Secondary | ICD-10-CM | POA: Insufficient documentation

## 2015-12-18 DIAGNOSIS — R9431 Abnormal electrocardiogram [ECG] [EKG]: Secondary | ICD-10-CM | POA: Insufficient documentation

## 2015-12-18 LAB — CULTURE, URINE COMPREHENSIVE

## 2015-12-18 LAB — SURGICAL PCR SCREEN
MRSA, PCR: NEGATIVE
Staphylococcus aureus: NEGATIVE

## 2015-12-18 NOTE — Patient Instructions (Signed)
Your procedure is scheduled on: Tuesday 12/23/15 Report to Day Surgery. 2ND FLOOR MEDICAL MALL ENTRANCE To find out your arrival time please call [phone] between 1PM - 3PM on Monday 12/22/15.  Remember: Instructions that are not followed completely may result in serious medical risk, up to and including death, or upon the discretion of your surgeon and anesthesiologist your surgery may need to be rescheduled.    __X__ 1. Do not eat food or drink liquids after midnight. No gum chewing or hard candies.     __X__ 2. No Alcohol for 24 hours before or after surgery.   ____ 3. Bring all medications with you on the day of surgery if instructed.    __X__ 4. Notify your doctor if there is any change in your medical condition     (cold, fever, infections).     Do not wear jewelry, make-up, hairpins, clips or nail polish.  Do not wear lotions, powders, or perfumes.   Do not shave 48 hours prior to surgery. Men may shave face and neck.  Do not bring valuables to the hospital.    [facility] is not responsible for any belongings or valuables.               Contacts, dentures or bridgework may not be worn into surgery.  Leave your suitcase in the car. After surgery it may be brought to your room.  For patients admitted to the hospital, discharge time is determined by your                treatment team.   Patients discharged the day of surgery will not be allowed to drive home.   Please read over the following fact sheets that you were given:   MRSA Information and Surgical Site Infection Prevention   __X__ Take these medicines the morning of surgery with A SIP OF WATER:    1. AMLODIPINE  2. ATORVASTATIN  3. GABAPENTIN  4. LEVOTHYROXINE  5. LISINOPRIL  6. PANTOPRAZOLE  7. VENLAFAXINE  ____ Fleet Enema (as directed)   __X__ Use CHG Soap as directed  ____ Use inhalers on the day of surgery  ____ Stop metformin 2 days prior to surgery    ____ Take 1/2 of usual insulin dose the night  before surgery and none on the morning of surgery.   __X__ Stop Coumadin/Plavix/aspirin on TODAY  ____ Stop Anti-inflammatories on    ____ Stop supplements until after surgery.    ____ Bring C-Pap to the hospital.

## 2015-12-18 NOTE — Pre-Procedure Instructions (Signed)
LABS  CBC w/auto Differential (5 Part) (10/14/2015 10:43 AM) CBC w/auto Differential (5 Part) (10/14/2015 10:43 AM)  Component Value Ref Range  WBC (White Blood Cell Count) 6.0 4.1 - 10.2 10^3/uL  RBC (Red Blood Cell Count) 3.67 (L) 4.04 - 5.48 10^6/uL  Hemoglobin 11.5 (L) 12.0 - 15.0 gm/dL  Hematocrit 34.4 (L) 35.0 - 47.0 %  MCV (Mean Corpuscular Volume) 93.7 80.0 - 100.0 fl  MCH (Mean Corpuscular Hemoglobin) 31.3 (H) 27.0 - 31.2 pg  MCHC (Mean Corpuscular Hemoglobin Concentration) 33.4 32.0 - 36.0 gm/dL  Platelet Count 259 150 - 450 10^3/uL  RDW-CV (Red Cell Distribution Width) 13.5 11.6 - 14.8 %  MPV (Mean Platelet Volume) 9.5 9.4 - 12.4 fl  Neutrophils 3.39 1.50 - 7.80 10^3/uL  Lymphocytes 1.82 1.00 - 3.60 10^3/uL  Monocytes 0.54 0.00 - 1.50 10^3/uL  Eosinophils 0.22 0.00 - 0.55 10^3/uL  Basophils 0.03 0.00 - 0.09 10^3/uL  Neutrophil % 56.3 32.0 - 70.0 %  Lymphocyte % 30.2 10.0 - 50.0 %  Monocyte % 9.0 4.0 - 13.0 %  Eosinophil % 3.7 1.0 - 5.0 %  Basophil% 0.5 0.0 - 2.0 %  Immature Granulocyte % 0.3 <=0.7 %  Immature Granulocyte Count 0.02 <=0.06 10^3/L   CBC w/auto Differential (5 Part) (10/14/2015 10:43 AM)  Specimen Performing Laboratory  Blood [facility] - LAB  [place], [place] [address]    Comprehensive Metabolic Panel (CMP) (67/59/1638 10:43 AM) Comprehensive Metabolic Panel (CMP) (46/65/9935 10:43 AM)  Component Value Ref Range  Glucose 84 70 - 110 mg/dL  Sodium 133 (L) 136 - 145 mmol/L  Potassium 4.3 3.6 - 5.1 mmol/L  Chloride 98 97 - 109 mmol/L  Carbon Dioxide (CO2) 23.5 22.0 - 32.0 mmol/L  Urea Nitrogen (BUN) 19 7 - 25 mg/dL  Creatinine 0.9 0.6 - 1.1 mg/dL  Glomerular Filtration Rate (eGFR), MDRD Estimate 60 (L) >60 mL/min/1.73sq m  Calcium 8.9 8.7 - 10.3 mg/dL  AST  23 8 - 39 U/L  ALT  25 5 - 38 U/L  Alk Phos (alkaline Phosphatase) 101 34 - 104 U/L  Albumin 3.7 3.5 - 4.8 g/dL  Bilirubin, Total 0.4 0.3 - 1.2 mg/dL   Protein, Total 6.0 (L) 6.1 - 7.9 g/dL  A/G Ratio 1.6 1.0 - 5.0 gm/dL   Comprehensive Metabolic Panel (CMP) (70/17/7939 10:43 AM)  Specimen Performing Laboratory  Blood [facility] - LAB  [place], [place] [address]

## 2015-12-22 ENCOUNTER — Telehealth: Payer: Self-pay | Admitting: Urology

## 2015-12-22 NOTE — Telephone Encounter (Signed)
Please contact Jodi Colon and ask her to provide a repeat urine sample (catheterized specimen) for preop early this week. Her urine culture grew a low colony count of the bacteria which is typically susceptible to penicillin but she is allergic to this. Prior to treating this, I think it is a good idea to obtain a good clean sampled to ensure that it the real infection.  Hollice Espy, MD

## 2015-12-22 NOTE — Telephone Encounter (Signed)
LMOM

## 2015-12-22 NOTE — Telephone Encounter (Signed)
Pt is scheduled for surgery 12/23/15. Discussed with Dr Erlene Quan. Will proceed with surgery as planned as this is likely colonization. Notified pt of same.

## 2015-12-23 MED ORDER — CIPROFLOXACIN IN D5W 400 MG/200ML IV SOLN
400.0000 mg | Freq: Two times a day (BID) | INTRAVENOUS | Status: DC
Start: 2015-12-23 — End: 2015-12-24
  Administered 2015-12-24: 400 mg via INTRAVENOUS

## 2015-12-23 NOTE — Telephone Encounter (Signed)
Pt's surgery was cancelled on 12/23/15 due to problems in the OR. Notified pt that surgery has been r/s to 12/24/15 with arrival time to SDS at 8:00. Pt voices understanding.

## 2015-12-24 ENCOUNTER — Encounter
Admission: RE | Disposition: A | Discharge status: Home / Self Care | Payer: Self-pay | Source: Ambulatory Visit | Attending: Urology

## 2015-12-24 ENCOUNTER — Encounter: Payer: Self-pay | Admitting: *Deleted

## 2015-12-24 ENCOUNTER — Ambulatory Visit: Payer: Medicare Other | Admitting: Anesthesiology

## 2015-12-24 ENCOUNTER — Ambulatory Visit
Admission: RE | Admit: 2015-12-24 | Discharge: 2015-12-24 | Disposition: A | Discharge status: Home / Self Care | Payer: Medicare Other | Source: Ambulatory Visit | Attending: Urology | Admitting: Urology

## 2015-12-24 DIAGNOSIS — Z8614 Personal history of Methicillin resistant Staphylococcus aureus infection: Secondary | ICD-10-CM | POA: Insufficient documentation

## 2015-12-24 DIAGNOSIS — Z9049 Acquired absence of other specified parts of digestive tract: Secondary | ICD-10-CM | POA: Diagnosis not present

## 2015-12-24 DIAGNOSIS — D090 Carcinoma in situ of bladder: Secondary | ICD-10-CM | POA: Diagnosis not present

## 2015-12-24 DIAGNOSIS — K219 Gastro-esophageal reflux disease without esophagitis: Secondary | ICD-10-CM | POA: Insufficient documentation

## 2015-12-24 DIAGNOSIS — F329 Major depressive disorder, single episode, unspecified: Secondary | ICD-10-CM | POA: Diagnosis not present

## 2015-12-24 DIAGNOSIS — Z888 Allergy status to other drugs, medicaments and biological substances status: Secondary | ICD-10-CM | POA: Insufficient documentation

## 2015-12-24 DIAGNOSIS — Z880 Allergy status to penicillin: Secondary | ICD-10-CM | POA: Insufficient documentation

## 2015-12-24 DIAGNOSIS — C679 Malignant neoplasm of bladder, unspecified: Secondary | ICD-10-CM | POA: Diagnosis present

## 2015-12-24 DIAGNOSIS — M1990 Unspecified osteoarthritis, unspecified site: Secondary | ICD-10-CM | POA: Diagnosis not present

## 2015-12-24 DIAGNOSIS — I10 Essential (primary) hypertension: Secondary | ICD-10-CM | POA: Diagnosis not present

## 2015-12-24 DIAGNOSIS — E039 Hypothyroidism, unspecified: Secondary | ICD-10-CM | POA: Insufficient documentation

## 2015-12-24 DIAGNOSIS — Z79899 Other long term (current) drug therapy: Secondary | ICD-10-CM | POA: Insufficient documentation

## 2015-12-24 DIAGNOSIS — Z90710 Acquired absence of both cervix and uterus: Secondary | ICD-10-CM | POA: Diagnosis not present

## 2015-12-24 DIAGNOSIS — Z7982 Long term (current) use of aspirin: Secondary | ICD-10-CM | POA: Insufficient documentation

## 2015-12-24 DIAGNOSIS — E785 Hyperlipidemia, unspecified: Secondary | ICD-10-CM | POA: Diagnosis not present

## 2015-12-24 DIAGNOSIS — Z853 Personal history of malignant neoplasm of breast: Secondary | ICD-10-CM | POA: Insufficient documentation

## 2015-12-24 DIAGNOSIS — Z8673 Personal history of transient ischemic attack (TIA), and cerebral infarction without residual deficits: Secondary | ICD-10-CM | POA: Insufficient documentation

## 2015-12-24 DIAGNOSIS — C678 Malignant neoplasm of overlapping sites of bladder: Secondary | ICD-10-CM

## 2015-12-24 DIAGNOSIS — D494 Neoplasm of unspecified behavior of bladder: Secondary | ICD-10-CM | POA: Diagnosis not present

## 2015-12-24 DIAGNOSIS — Z882 Allergy status to sulfonamides status: Secondary | ICD-10-CM | POA: Diagnosis not present

## 2015-12-24 HISTORY — PX: CYSTOSCOPY W/ RETROGRADES: SHX1426

## 2015-12-24 HISTORY — PX: CYSTOSCOPY WITH BIOPSY: SHX5122

## 2015-12-24 SURGERY — CYSTOSCOPY, WITH RETROGRADE PYELOGRAM
Anesthesia: General | Wound class: Clean Contaminated

## 2015-12-24 MED ORDER — MEPERIDINE HCL 25 MG/ML IJ SOLN
6.2500 mg | INTRAMUSCULAR | Status: DC | PRN
Start: 2015-12-24 — End: 2015-12-24

## 2015-12-24 MED ORDER — EPHEDRINE SULFATE 50 MG/ML IJ SOLN
INTRAMUSCULAR | Status: DC | PRN
  Administered 2015-12-24: 7.5 mg via INTRAVENOUS

## 2015-12-24 MED ORDER — FENTANYL CITRATE (PF) 100 MCG/2ML IJ SOLN: 25.0000 ug | INTRAMUSCULAR | Status: DC | PRN

## 2015-12-24 MED ORDER — FENTANYL CITRATE (PF) 100 MCG/2ML IJ SOLN
INTRAMUSCULAR | Status: DC | PRN
  Administered 2015-12-24: 50 ug via INTRAVENOUS
  Administered 2015-12-24: 25 ug via INTRAVENOUS

## 2015-12-24 MED ORDER — PROMETHAZINE HCL 25 MG/ML IJ SOLN: 6.2500 mg | INTRAMUSCULAR | Status: DC | PRN

## 2015-12-24 MED ORDER — OXYCODONE HCL 5 MG/5ML PO SOLN: 5.0000 mg | Freq: Once | ORAL | Status: DC | PRN

## 2015-12-24 MED ORDER — OXYCODONE HCL 5 MG PO TABS
5.0000 mg | ORAL_TABLET | Freq: Once | ORAL | Status: DC | PRN
Start: 2015-12-24 — End: 2015-12-24

## 2015-12-24 MED ORDER — IOTHALAMATE MEGLUMINE 43 % IV SOLN
INTRAVENOUS | Status: DC | PRN
  Administered 2015-12-24: 20 mL

## 2015-12-24 MED ORDER — KETOROLAC TROMETHAMINE 30 MG/ML IJ SOLN
INTRAMUSCULAR | Status: DC | PRN
  Administered 2015-12-24: 15 mg via INTRAVENOUS

## 2015-12-24 MED ORDER — PROPOFOL 10 MG/ML IV BOLUS
INTRAVENOUS | Status: DC | PRN
  Administered 2015-12-24: 100 mg via INTRAVENOUS
  Administered 2015-12-24: 30 mg via INTRAVENOUS

## 2015-12-24 MED ORDER — ONDANSETRON HCL 4 MG/2ML IJ SOLN
INTRAMUSCULAR | Status: DC | PRN
  Administered 2015-12-24: 4 mg via INTRAVENOUS

## 2015-12-24 MED ORDER — LIDOCAINE HCL (CARDIAC) 20 MG/ML IV SOLN
INTRAVENOUS | Status: DC | PRN
  Administered 2015-12-24: 40 mg via INTRAVENOUS

## 2015-12-24 MED ORDER — CIPROFLOXACIN IN D5W 400 MG/200ML IV SOLN
INTRAVENOUS | Status: AC
  Administered 2015-12-24: 400 mg via INTRAVENOUS
  Filled 2015-12-24: qty 200

## 2015-12-24 MED ORDER — LACTATED RINGERS IV SOLN
INTRAVENOUS | Status: DC
  Administered 2015-12-24 (×2): via INTRAVENOUS

## 2015-12-24 SURGICAL SUPPLY — 22 items
BAG DRAIN CYSTO-URO LG1000N (MISCELLANEOUS) ×3 IMPLANT
CATH URETL 5X70 OPEN END (CATHETERS) ×3 IMPLANT
CONRAY 43 FOR UROLOGY 50M (MISCELLANEOUS) ×3 IMPLANT
CORD URO TURP 10FT (MISCELLANEOUS) ×3 IMPLANT
DRAPE UTILITY 15X26 TOWEL STRL (DRAPES) ×3 IMPLANT
ELECT REM PT RETURN 9FT ADLT (ELECTROSURGICAL) ×3
ELECTRODE REM PT RTRN 9FT ADLT (ELECTROSURGICAL) ×2 IMPLANT
GLOVE BIO SURGEON STRL SZ 6.5 (GLOVE) ×3 IMPLANT
GLOVE BIO SURGEON STRL SZ7 (GLOVE) ×6 IMPLANT
GOWN STRL REUS W/ TWL LRG LVL3 (GOWN DISPOSABLE) ×4 IMPLANT
GOWN STRL REUS W/TWL LRG LVL3 (GOWN DISPOSABLE) ×2
KIT RM TURNOVER CYSTO AR (KITS) ×3 IMPLANT
PACK CYSTO AR (MISCELLANEOUS) ×3 IMPLANT
PREP PVP WINGED SPONGE (MISCELLANEOUS) IMPLANT
SENSORWIRE 0.038 NOT ANGLED (WIRE) ×3
SET CYSTO W/LG BORE CLAMP LF (SET/KITS/TRAYS/PACK) ×3 IMPLANT
SOL .9 NS 3000ML IRR  AL (IV SOLUTION) ×1
SOL .9 NS 3000ML IRR UROMATIC (IV SOLUTION) ×2 IMPLANT
SURGILUBE 2OZ TUBE FLIPTOP (MISCELLANEOUS) ×3 IMPLANT
WATER STERILE IRR 1000ML POUR (IV SOLUTION) ×3 IMPLANT
WATER STERILE IRR 3000ML UROMA (IV SOLUTION) ×3 IMPLANT
WIRE SENSOR 0.038 NOT ANGLED (WIRE) ×2 IMPLANT

## 2015-12-24 NOTE — Anesthesia Preprocedure Evaluation (Signed)
Anesthesia Evaluation  Patient identified by MRN, date of birth, ID band Patient awake    Reviewed: Allergy & Precautions, NPO status , Patient's Chart, lab work & pertinent test results  History of Anesthesia Complications Negative for: history of anesthetic complications  Airway Mallampati: II  TM Distance: >3 FB Neck ROM: Full    Dental no notable dental hx.    Pulmonary neg COPD,    breath sounds clear to auscultation- rhonchi (-) wheezing      Cardiovascular Exercise Tolerance: Good hypertension, Pt. on medications (-) CAD and (-) Past MI  Rhythm:Regular Rate:Normal - Systolic murmurs and - Diastolic murmurs    Neuro/Psych  Headaches, Depression TIA (no residual deficits)   GI/Hepatic Neg liver ROS, hiatal hernia, GERD  ,  Endo/Other  neg diabetesHypothyroidism   Renal/GU Bladder cancer    Bladder cancer     Musculoskeletal  (+) Arthritis , Osteoarthritis,    Abdominal (+) - obese,   Peds  Hematology  (+) anemia ,   Anesthesia Other Findings Past Medical History: No date: Adnexal cyst No date: Arthritis 04/2015: Bladder cancer ([place]) No date: Brain aneurysm     Comment: small, followed with regular angiograms, Dr.               Estanislado Pandy 2007: Breast cancer [facility])     Comment: Right Breast Lumpectomy 2008: Cerebrovascular disease     Comment: s/p PTCA/stent of MCA, Deveshwar No date: Chronic kidney disease 2007: DCIS (ductal carcinoma in situ) of breast     Comment: Right, s/p lumpectomy/xrt  No date: DDD (degenerative disc disease), cervical No date: Degenerative joint disease     Comment: managed by the [facility] Clinic No date: GERD (gastroesophageal reflux disease) No date: Headache No date: History of hiatal hernia No date: History of MRSA infection No date: History of TIA (transient ischemic attack) No date: Hx MRSA infection No date: Hyperlipidemia No date: Hypertension No date: Incomplete  bladder emptying No date: Metastatic malignant neoplasm to dome of urina* 2008: MRSA (methicillin resistant Staphylococcus aur*     Comment: perineal abscess No date: Nocturia 2007: Radiation     Comment: BREAST CA No date: Spinal stenosis No date: Stroke [facility]) No date: Thyroid disease No date: Urge incontinence   Reproductive/Obstetrics                             Anesthesia Physical Anesthesia Plan  ASA: III  Anesthesia Plan: General   Post-op Pain Management:    Induction: Intravenous  Airway Management Planned: LMA  Additional Equipment:   Intra-op Plan:   Post-operative Plan:   Informed Consent: I have reviewed the patients History and Physical, chart, labs and discussed the procedure including the risks, benefits and alternatives for the proposed anesthesia with the patient or authorized representative who has indicated his/her understanding and acceptance.   Dental advisory given  Plan Discussed with:   Anesthesia Plan Comments:         Anesthesia Quick Evaluation

## 2015-12-24 NOTE — Anesthesia Procedure Notes (Signed)
Procedure Name: LMA Insertion Date/Time: 12/24/2015 9:05 AM Performed by: Silvana Newness Pre-anesthesia Checklist: Patient identified, Emergency Drugs available, Suction available, Patient being monitored and Timeout performed Patient Re-evaluated:Patient Re-evaluated prior to inductionOxygen Delivery Method: Circle system utilized Preoxygenation: Pre-oxygenation with 100% oxygen Intubation Type: IV induction Ventilation: Mask ventilation without difficulty LMA: LMA inserted LMA Size: 4.0 Number of attempts: 1 Placement Confirmation: positive ETCO2 and breath sounds checked- equal and bilateral Tube secured with: Tape Dental Injury: Teeth and Oropharynx as per pre-operative assessment

## 2015-12-24 NOTE — H&P (View-Only) (Signed)
10:22 AM   12/16/2015   Sabino Snipes [birth year] [id number]  Referring provider: Tracie Harrier, MD [address] [facility] [place], [place] [address]  Chief Complaint  Patient presents with  . Bladder Cancer    discuss surgery    HPI: 82 year old Caucasian female with history of recurrent bladder cancer who returns today For follow-up after recent evaluation at [facility] for consideration of cystectomy after cystoscopy is worrisome for another BCG failure.    Mrs. Kriss is  previously a patient of Dr. Bernardo Heater at [facility] followed by Dr. Elnoria Howard [facility]). She was diagnosed with high-grade TA TCC of the bladder in 2011. She underwent induction BCG 6 and briefly on maintenance. She then had an abnormal urine cytology on September 2014 which was consistent with CIS. She then underwent induction with only 4 treatments but did not complete the full course due to a nationwide backorder of the medication. She developed another recurrence and returned to the OR on 05/20/15 for cystoscopy, bilateral retrograde pyelogram (normal) and bladder biopsy x 6 performed by Dr. Erlene Quan. 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  She completed another induction series of BCG on 08/14/2015 x 6.  Of note, this course was very poorly tolerated.  She does have severe baseline symptoms of overactive bladder, incontinence, and severe persistent dysuria. Per notes, she is been tried on DDAVP and ?botox.  She is currently Uribell and mybetriq  which do not help much with her symptoms. Her quality of life is declined greatly due to her severe urinary symptoms.  She had numerous UA/urine cultures most of which grown no bacteria.  She was treated for an Escherichia coli urinary tract infection on 10/16/2015 most recently.  Recent culture was subsequently negative.  Incidentally, she did have a recent CT abdomen pelvis with contrast in 08/2015 which showed no evidence of metastatic  these seeds. Her bladder wall was thickened.  Most recent cystoscopy on 11/14/2015 showed highly irregular urothelium with patchy diffuse erythema and some early papillary changes concerning for persistent/recurrent CIS and perhaps further progression.     She was seen in consultation at [facility] urology by Dr. Carla Drape for consideration for possible cystectomy given concern for BCG failure and multiple previous failures. I discussed the case with him and he has concerns that she would not tolerate cystectomy given her history of stroke and overall comorbidities. He has recommended confirmation of recurrence with cystoscopy, bladder biopsy, bilateral retrogrades.  She would like to have this procedure done locally in [place].  PMH: Past Medical History  Diagnosis Date  . Degenerative joint disease     managed by [facility]  . Hypertension   . Hyperlipidemia   . MRSA (methicillin resistant Staphylococcus aureus) 2008    perineal abscess  . Spinal stenosis   . DCIS (ductal carcinoma in situ) of breast 2007    Right, s/p lumpectomy/xrt   . Cerebrovascular disease 2008    s/p PTCA/stent of MCA, [place]  . History of MRSA infection   . Stroke ([place])   . Arthritis   . GERD (gastroesophageal reflux disease)   . Urge incontinence   . Nocturia   . Thyroid disease   . DDD (degenerative disc disease), cervical   . Incomplete bladder emptying   . Adnexal cyst   . Chronic kidney disease   . History of hiatal hernia   . Headache   . History of TIA (transient ischemic attack)   . Hx MRSA infection   .  Brain aneurysm     small, followed with regular angiograms, Dr. Estanislado Pandy  . Bladder cancer ([place]) 04/2015  . 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 ([place])   . Breast cancer [facility] 2007    Right Breast Lumpectomy  . Radiation 2007    BREAST CA    Surgical History: Past Surgical History  Procedure Laterality Date  . Cholecystectomy    . Colonoscopy  2013  .  Hemorrhoid surgery  02/01/13  . Cataract extraction    . Tonsillectomy    . Breast lumpectomy    . Laparoscopic hysterectomy    . Abdominal hysterectomy    . Back surgery    . Fracture surgery Left     Ankle, compound fracture  . Eye surgery Bilateral     Cataract Extraction  . Bladder surgery  2012, 2014  . Brain surgery Right 2008    stent, [facility], [place]  . Cystoscopy with biopsy N/A 05/20/2015    Procedure: CYSTOSCOPY WITH BIOPSY;  Surgeon: Hollice Espy, MD;  Location: [facility] ORS;  Service: Urology;  Laterality: N/A;  . Cystoscopy w/ retrogrades Bilateral 05/20/2015    Procedure: CYSTOSCOPY WITH RETROGRADE PYELOGRAM;  Surgeon: Hollice Espy, MD;  Location: [facility] ORS;  Service: Urology;  Laterality: Bilateral;    Home Medications:    Medication List       This list is accurate as of: 12/16/15 10:22 AM.  Always use your most recent med list.               amLODipine 5 MG tablet  Commonly known as:  NORVASC  Take 5 mg by mouth daily.     aspirin 81 MG tablet  Take 81 mg by mouth daily.     atorvastatin 20 MG tablet  Commonly known as:  LIPITOR  Take 20 mg by mouth every morning.     ferrous sulfate 325 (65 FE) MG EC tablet  Take 325 mg by mouth daily with breakfast.     gabapentin 100 MG capsule  Commonly known as:  NEURONTIN  Take 200 mg by mouth every morning. Take 400 mg at bedtime     HYDROcodone-acetaminophen 7.5-325 MG tablet  Commonly known as:  NORCO  Take 1 tablet by mouth 3 (three) times daily as needed for moderate pain.     latanoprost 0.005 % ophthalmic solution  Commonly known as:  XALATAN     levothyroxine 50 MCG tablet  Commonly known as:  SYNTHROID, LEVOTHROID  Take 50-75 mcg by mouth See admin instructions. Take one tablet by mouth daily except on sundays take one and one half by mouth. Per Med record     lisinopril 40 MG tablet  Commonly known as:  PRINIVIL,ZESTRIL  Take 1 tablet (40 mg total) by mouth daily.     multivitamin  tablet  Take 1 tablet by mouth daily.     PROTONIX 40 MG tablet  Generic drug:  pantoprazole  Take 40 mg by mouth 2 (two) times daily.     timolol 0.5 % ophthalmic solution  Commonly known as:  TIMOPTIC     tiZANidine 2 MG tablet  Commonly known as:  ZANAFLEX  Take 2 mg by mouth at bedtime.     topiramate 25 MG tablet  Commonly known as:  TOPAMAX  Take 25 mg by mouth daily.     traZODone 50 MG tablet  Commonly known as:  DESYREL  Take 50-100 mg by mouth at bedtime as needed for sleep.  Venlafaxine HCl 150 MG Tb24  Take 1 tablet by mouth daily.        Allergies:  Allergies  Allergen Reactions  . Prednisone     Patient reported this date that she was allergic to PCN, rather than Prednisone.  Patient reported this date that she was allergic to PCN, rather than Prednisone.   Sarina Ill [Bactrim] Itching  . Sulfasalazine Swelling    "swelling of face"  . Penicillins Rash    Rash Rash  . Sulfa Antibiotics Rash  . Sulfamethoxazole-Trimethoprim Rash    Family History: Family History  Problem Relation Age of Onset  . Fibromyalgia Daughter   . Breast cancer Neg Hx     Social History:  reports that she has never smoked. She has never used smokeless tobacco. She reports that she does not drink alcohol or use illicit drugs.   Physical Exam: BP 148/65 mmHg  Pulse 75  Ht 4\' 9"  (1.448 m)  Wt 107 lb (48.535 kg)  BMI 23.15 kg/m2  Constitutional:  Alert and oriented, No acute distress. HEENT:  AT, moist mucus membranes.  Trachea midline, no masses. Cardiovascular: No clubbing, cyanosis, or edema. RRR. Respiratory: Normal respiratory effort, no increased work of breathing. CTAB. Skin: No rashes, bruises or suspicious lesions. Neurologic: Grossly intact, no focal deficits, moving all 4 extremities. Psychiatric: Normal mood and affect.  Laboratory Data: Comprehensive Metabolic Panel (CMP) (Q000111Q 10:43 AM) Comprehensive Metabolic Panel (CMP) (Q000111Q 10:43  AM)  Component Value Ref Range  Glucose 84 70 - 110 mg/dL  Sodium 133 (L) 136 - 145 mmol/L  Potassium 4.3 3.6 - 5.1 mmol/L  Chloride 98 97 - 109 mmol/L  Carbon Dioxide (CO2) 23.5 22.0 - 32.0 mmol/L  Urea Nitrogen (BUN) 19 7 - 25 mg/dL  Creatinine 0.9 0.6 - 1.1 mg/dL   CBC w/auto Differential (5 Part) (10/14/2015 10:43 AM) CBC w/auto Differential (5 Part) (10/14/2015 10:43 AM)  Component Value Ref Range  WBC (White Blood Cell Count) 6.0 4.1 - 10.2 10^3/uL  RBC (Red Blood Cell Count) 3.67 (L) 4.04 - 5.48 10^6/uL  Hemoglobin 11.5 (L) 12.0 - 15.0 gm/dL  Hematocrit 34.4 (L) 35.0 - 47.0 %  MCV (Mean Corpuscular Volume) 93.7 80.0 - 100.0 fl  MCH (Mean Corpuscular Hemoglobin) 31.3 (H) 27.0 - 31.2 pg  MCHC (Mean Corpuscular Hemoglobin Concentration) 33.4 32.0 - 36.0 gm/dL  Platelet Count 259 150 - 450 10^3/uL     Urinalysis Results for orders placed or performed in visit on 11/14/15  Microscopic Examination  Result Value Ref Range   WBC, UA 6-10 (A) 0 -  5 /hpf   RBC, UA 11-30 (A) 0 -  2 /hpf   Epithelial Cells (non renal) 0-10 0 - 10 /hpf   Bacteria, UA None seen None seen/Few  Urinalysis, Complete  Result Value Ref Range   Specific Gravity, UA 1.020 1.005 - 1.030   pH, UA 6.0 5.0 - 7.5   Color, UA Yellow Yellow   Appearance Ur Hazy (A) Clear   Leukocytes, UA Trace (A) Negative   Protein, UA 1+ (A) Negative/Trace   Glucose, UA Negative Negative   Ketones, UA Negative Negative   RBC, UA 1+ (A) Negative   Bilirubin, UA Negative Negative   Urobilinogen, Ur 0.2 0.2 - 1.0 mg/dL   Nitrite, UA Negative Negative   Microscopic Examination See below:     Assessment & Plan:   1. Malignant neoplasm of overlapping sites of bladder [place])-  Long history of recurrent high-grade bladder cancer/CIS  status post at least 2 full induction courses of BCG along with a partial induction course who appears to have extensive residual disease highly suspicious for BCG failure on cystoscopy 10/2015.  Urine cytology negative.  No evidence of metastatic diease on recent CT abd/ pelvis.    Recent consult at [facility] urology confirms that she is not an ideal candidate for cystectomy.  As such, I have recommended returning to the operating room for cystoscopy, bladder biopsy, bilateral retrograde pyelogram to confirm or refute BCG failure. Should this show persistent CIS, we will arrange for second line intravesical therapy.  She is agreeable with this plan.  All of their questions were answered in detail.  She is familiar with the procedure and risk as she is having this procedure multiple times in the past.  2. Dysuria:    Suspect this is more likely related to underlying bladder cancer, bladder irritation/inflammation.  Continue supportive care with Mybetriq and Uribell.  3. Frequency:  See above.    Schedule surgery as above  Hollice Espy, MD  [facility] [address] [place], [place] [address] [phone]

## 2015-12-24 NOTE — Interval H&P Note (Signed)
History and Physical Interval Note:  12/24/2015 8:35 AM  Jodi Colon  has presented today for surgery, with the diagnosis of HISTORY OF BLADDER NEOPLASM  The various methods of treatment have been discussed with the patient and family. After consideration of risks, benefits and other options for treatment, the patient has consented to  Procedure(s): CYSTOSCOPY WITH RETROGRADE PYELOGRAM (Bilateral) CYSTOSCOPY WITH BIOPSY (N/A) as a surgical intervention .  The patient's history has been reviewed, patient examined, no change in status, stable for surgery.  I have reviewed the patient's chart and labs.  Questions were answered to the patient's satisfaction.     Hollice Espy

## 2015-12-24 NOTE — Transfer of Care (Signed)
Immediate Anesthesia Transfer of Care Note  Patient: Jodi Colon  Procedure(s) Performed: Procedure(s): CYSTOSCOPY WITH RETROGRADE PYELOGRAM (Bilateral) CYSTOSCOPY WITH BIOPSY (N/A)  Patient Location: PACU  Anesthesia Type:General  Level of Consciousness: sedated  Airway & Oxygen Therapy: Patient Spontanous Breathing and Patient connected to face mask oxygen  Post-op Assessment: Report given to RN and Post -op Vital signs reviewed and stable  Post vital signs: Reviewed and stable  Last Vitals:  Vitals:   12/24/15 0820  BP: (!) 130/49  Pulse: 63  Resp: 16  Temp: 36.4 C    Last Pain:  Vitals:   12/24/15 0820  TempSrc: Oral         Complications: No apparent anesthesia complications

## 2015-12-24 NOTE — Discharge Instructions (Signed)
Transurethral Resection of Bladder Tumor (TURBT) or Bladder Biopsy ° ° °Definition: ° Transurethral Resection of the Bladder Tumor is a surgical procedure used to diagnose and remove tumors within the bladder. TURBT is the most common treatment for early stage bladder cancer. ° °General instructions: °   ° Your recent bladder surgery requires very little post hospital care but some definite precautions. ° °Despite the fact that no skin incisions were used, the area around the bladder incisions are raw and covered with scabs to promote healing and prevent bleeding. Certain precautions are needed to insure that the scabs are not disturbed over the next 2-4 weeks while the healing proceeds. ° °Because the raw surface inside your bladder and the irritating effects of urine you may expect frequency of urination and/or urgency (a stronger desire to urinate) and perhaps even getting up at night more often. This will usually resolve or improve slowly over the healing period. You may see some blood in your urine over the first 6 weeks. Do not be alarmed, even if the urine was clear for a while. Get off your feet and drink lots of fluids until clearing occurs. If you start to pass clots or don't improve call us. ° °Diet: ° °You may return to your normal diet immediately. Because of the raw surface of your bladder, alcohol, spicy foods, foods high in acid and drinks with caffeine may cause irritation or frequency and should be used in moderation. To keep your urine flowing freely and avoid constipation, drink plenty of fluids during the day (8-10 glasses). Tip: Avoid cranberry juice because it is very acidic. ° °Activity: ° °Your physical activity doesn't need to be restricted. However, if you are very active, you may see some blood in the urine. We suggest that you reduce your activity under the circumstances until the bleeding has stopped. ° °Bowels: ° °It is important to keep your bowels regular during the postoperative  period. Straining with bowel movements can cause bleeding. A bowel movement every other day is reasonable. Use a mild laxative if needed, such as milk of magnesia 2-3 tablespoons, or 2 Dulcolax tablets. Call if you continue to have problems. If you had been taking narcotics for pain, before, during or after your surgery, you may be constipated. Take a laxative if necessary. ° ° ° °Medication: ° °You should resume your pre-surgery medications unless told not to. In addition you may be given an antibiotic to prevent or treat infection. Antibiotics are not always necessary. All medication should be taken as prescribed until the bottles are finished unless you are having an unusual reaction to one of the drugs. ° ° °[facility] °[address], [address] °, [place] [address] °[phone] ° ° °AMBULATORY SURGERY  °DISCHARGE INSTRUCTIONS ° ° °1) The drugs that you were given will stay in your system until tomorrow so for the next 24 hours you should not: ° °A) Drive an automobile °B) Make any legal decisions °C) Drink any alcoholic beverage ° ° °2) You may resume regular meals tomorrow.  Today it is better to start with liquids and gradually work up to solid foods. ° °You may eat anything you prefer, but it is better to start with liquids, then soup and crackers, and gradually work up to solid foods. ° ° °3) Please notify your doctor immediately if you have any unusual bleeding, trouble breathing, redness and pain at the surgery site, drainage, fever, or pain not relieved by medication. ° ° ° °4)   Additional Instructions: ° ° ° ° ° ° ° °Please contact your physician with any problems or Same Day Surgery at [phone], Monday through Friday 6 am to 4 pm, or [facility] Main number at [phone]. ° ° ° ° °

## 2015-12-24 NOTE — Anesthesia Postprocedure Evaluation (Signed)
Anesthesia Post Note  Patient: AQUILLA LABORE  Procedure(s) Performed: Procedure(s) (LRB): CYSTOSCOPY WITH RETROGRADE PYELOGRAM (Bilateral) CYSTOSCOPY WITH BIOPSY (N/A)  Patient location during evaluation: PACU Anesthesia Type: General Level of consciousness: awake and alert Pain management: pain level controlled Vital Signs Assessment: post-procedure vital signs reviewed and stable Respiratory status: spontaneous breathing, nonlabored ventilation and respiratory function stable Cardiovascular status: blood pressure returned to baseline and stable Postop Assessment: no signs of nausea or vomiting Anesthetic complications: no    Last Vitals:  Vitals:   12/24/15 1025 12/24/15 1035  BP: (!) 149/61 (!) 175/76  Pulse: 71 71  Resp: 11 16  Temp: 36.3 C 36.4 C    Last Pain:  Vitals:   12/24/15 1035  TempSrc: Oral  PainSc:                  Adraine Biffle

## 2015-12-24 NOTE — Op Note (Signed)
Date of procedure: 12/24/15  Preoperative diagnosis:  1.  history of carcinoma in situ 2.  Suspicious bladder lesion   Postoperative diagnosis:  1. Same as above   Procedure: 1.  cystoscopy 2.  bilateral retrograde pyelogram 3.  Bladder biopsy 6  Surgeon: Hollice Espy, MD  Anesthesia: General  Complications: None  Intraoperative findings:  Diffuse velvety patchy erythema throughout bladder, highly suspicious for CIS  EBL:  minimal  Specimens:  Bladder biopsy 6  Drains:  none  Indication: Jodi Colon is a 82 y.o. patient with  history of carcinoma in situ found to have suspicious findings within her bladder highly suspicious for recurrence s/p repeat induction course of BCG.  After reviewing the management options for treatment, she elected to proceed with the above surgical procedure(s). We have discussed the potential benefits and risks of the procedure, side effects of the proposed treatment, the likelihood of the patient achieving the goals of the procedure, and any potential problems that might occur during the procedure or recuperation. Informed consent has been obtained.  Description of procedure:  The patient was taken to the operating room and general anesthesia was induced.  The patient was placed in the dorsal lithotomy position, prepped and draped in the usual sterile fashion, and preoperative antibiotics were administered. A preoperative time-out was performed.    A 21 French rigid cystoscope was advanced per urethra into the bladder and the bladder was carefully inspected. This revealed diffuse swaths of velvety erythema along the posterior wall of the bladder, dome of the bladder, left and right lateral walls.  There is also edema on the left lateral wall bladder in close proximity to the left UO and trigone. There is a few areas of normal urothelial sparing. Trigone was normal with clear reflux of urine from both. Attention was turned to the left  ureteral orifice which was cannulated with a 5 [place] open-ended ureteral catheter and a gentle retrograde pyelogram was  Performed. This revealed a delicate ureter with a decompressed collecting system without any filling defects or suspicious lesions. The same procedure was performed on the right side which was also normal.    At this point in time, using cold cup biopsy forceps, a total of 6 biopsies were taken and targeted locations including the posterior wall, dome, left and right lateral walls, and trigone. These were passed off the table as 1 specimen labeled bladder biopsies. Each of the sites were fulgurated and hemostasis was excellent at this point. The bladder was then drained the scope was removed. She was reversed from anesthesia, and taken to the PACU in stable condition. There were no complications in this case.  Hollice Espy, M.D.

## 2015-12-25 ENCOUNTER — Encounter: Payer: Self-pay | Admitting: Urology

## 2015-12-25 LAB — SURGICAL PATHOLOGY

## 2015-12-31 ENCOUNTER — Encounter: Payer: Self-pay | Admitting: *Deleted

## 2016-01-02 NOTE — Discharge Instructions (Signed)

## 2016-01-05 ENCOUNTER — Ambulatory Visit
Admission: RE | Admit: 2016-01-05 | Discharge: 2016-01-05 | Disposition: A | Discharge status: Home / Self Care | Payer: Medicare Other | Source: Ambulatory Visit | Attending: Ophthalmology | Admitting: Ophthalmology

## 2016-01-05 ENCOUNTER — Encounter
Admission: RE | Disposition: A | Discharge status: Home / Self Care | Payer: Self-pay | Source: Ambulatory Visit | Attending: Ophthalmology

## 2016-01-05 ENCOUNTER — Ambulatory Visit: Payer: Medicare Other | Admitting: Student in an Organized Health Care Education/Training Program

## 2016-01-05 DIAGNOSIS — K449 Diaphragmatic hernia without obstruction or gangrene: Secondary | ICD-10-CM | POA: Insufficient documentation

## 2016-01-05 DIAGNOSIS — Z8551 Personal history of malignant neoplasm of bladder: Secondary | ICD-10-CM | POA: Diagnosis not present

## 2016-01-05 DIAGNOSIS — Z853 Personal history of malignant neoplasm of breast: Secondary | ICD-10-CM | POA: Diagnosis not present

## 2016-01-05 DIAGNOSIS — G2581 Restless legs syndrome: Secondary | ICD-10-CM | POA: Insufficient documentation

## 2016-01-05 DIAGNOSIS — Z880 Allergy status to penicillin: Secondary | ICD-10-CM | POA: Diagnosis not present

## 2016-01-05 DIAGNOSIS — H40052 Ocular hypertension, left eye: Secondary | ICD-10-CM | POA: Diagnosis not present

## 2016-01-05 DIAGNOSIS — I739 Peripheral vascular disease, unspecified: Secondary | ICD-10-CM | POA: Diagnosis not present

## 2016-01-05 DIAGNOSIS — Z881 Allergy status to other antibiotic agents status: Secondary | ICD-10-CM | POA: Insufficient documentation

## 2016-01-05 DIAGNOSIS — E7800 Pure hypercholesterolemia, unspecified: Secondary | ICD-10-CM | POA: Diagnosis not present

## 2016-01-05 DIAGNOSIS — M1990 Unspecified osteoarthritis, unspecified site: Secondary | ICD-10-CM | POA: Diagnosis not present

## 2016-01-05 DIAGNOSIS — D649 Anemia, unspecified: Secondary | ICD-10-CM | POA: Diagnosis not present

## 2016-01-05 DIAGNOSIS — Z882 Allergy status to sulfonamides status: Secondary | ICD-10-CM | POA: Diagnosis not present

## 2016-01-05 DIAGNOSIS — Z8673 Personal history of transient ischemic attack (TIA), and cerebral infarction without residual deficits: Secondary | ICD-10-CM | POA: Diagnosis not present

## 2016-01-05 DIAGNOSIS — Z8614 Personal history of Methicillin resistant Staphylococcus aureus infection: Secondary | ICD-10-CM | POA: Insufficient documentation

## 2016-01-05 DIAGNOSIS — E079 Disorder of thyroid, unspecified: Secondary | ICD-10-CM | POA: Diagnosis not present

## 2016-01-05 DIAGNOSIS — I10 Essential (primary) hypertension: Secondary | ICD-10-CM | POA: Diagnosis not present

## 2016-01-05 DIAGNOSIS — K219 Gastro-esophageal reflux disease without esophagitis: Secondary | ICD-10-CM | POA: Diagnosis not present

## 2016-01-05 HISTORY — PX: AQUEOUS SHUNT: SHX6305

## 2016-01-05 SURGERY — INSERTION, AQUEOUS SHUNT, EYE
Anesthesia: Monitor Anesthesia Care | Laterality: Left | Wound class: Clean

## 2016-01-05 MED ORDER — LIDOCAINE HCL (PF) 4 % IJ SOLN
INTRAMUSCULAR | Status: DC | PRN
  Administered 2016-01-05: 2.5 mL via OPHTHALMIC

## 2016-01-05 MED ORDER — FENTANYL CITRATE (PF) 100 MCG/2ML IJ SOLN
INTRAMUSCULAR | Status: DC | PRN
  Administered 2016-01-05: 50 ug via INTRAVENOUS

## 2016-01-05 MED ORDER — ACETAMINOPHEN 325 MG PO TABS: 325.0000 mg | ORAL_TABLET | ORAL | Status: DC | PRN

## 2016-01-05 MED ORDER — ACETAMINOPHEN 160 MG/5ML PO SOLN: 325.0000 mg | ORAL | Status: DC | PRN

## 2016-01-05 MED ORDER — ERYTHROMYCIN 5 MG/GM OP OINT
TOPICAL_OINTMENT | OPHTHALMIC | Status: DC | PRN
  Administered 2016-01-05: 1 via OPHTHALMIC

## 2016-01-05 MED ORDER — LACTATED RINGERS IV SOLN: INTRAVENOUS | Status: DC

## 2016-01-05 MED ORDER — MIDAZOLAM HCL 2 MG/2ML IJ SOLN
INTRAMUSCULAR | Status: DC | PRN
  Administered 2016-01-05: 2 mg via INTRAVENOUS

## 2016-01-05 SURGICAL SUPPLY — 38 items
ALLOGRAFT TUTOPLST SCER0.5X1.0 (Tissue) ×1 IMPLANT
APPLICATOR COTTON TIP 3IN (MISCELLANEOUS) ×2 IMPLANT
BANDAGE EYE OVAL (MISCELLANEOUS) ×4 IMPLANT
BLADE SCLEROTME MULTI-SIDE (MISCELLANEOUS) IMPLANT
CANNULA ANT/CHMB 27GA (MISCELLANEOUS) ×4 IMPLANT
CORD BIP STRL DISP 12FT (MISCELLANEOUS) ×2 IMPLANT
CUP MEDICINE 2OZ PLAST GRAD ST (MISCELLANEOUS) ×2 IMPLANT
ERASER TAPRD BLUNT STR 20-23GA (MISCELLANEOUS) ×1 IMPLANT
GLOVE BIO SURGEON STRL SZ7 (GLOVE) ×2 IMPLANT
GLOVE SURG LX 6.5 MICRO (GLOVE) ×1
GLOVE SURG LX STRL 6.5 MICRO (GLOVE) ×1 IMPLANT
GOWN STRL REUS W/ TWL LRG LVL3 (GOWN DISPOSABLE) ×2 IMPLANT
GOWN STRL REUS W/TWL LRG LVL3 (GOWN DISPOSABLE) ×2
KNIFE OPTIMUM SIDEPORT 15DEG (MISCELLANEOUS) IMPLANT
KNIFE SIDECUT EYE (MISCELLANEOUS) ×2 IMPLANT
MARKER SKIN DUAL TIP RULER LAB (MISCELLANEOUS) ×2 IMPLANT
NDL SAFETY 22GX1.5 (NEEDLE) ×2 IMPLANT
NEEDLE FILTER BLUNT 18X 1/2SAF (NEEDLE) ×2
NEEDLE FILTER BLUNT 18X1 1/2 (NEEDLE) ×2 IMPLANT
NEEDLE HYPO 30X.5 LL (NEEDLE) IMPLANT
PACK EYE AFTER SURG (MISCELLANEOUS) ×2 IMPLANT
PACK OPTHALMIC (MISCELLANEOUS) ×2 IMPLANT
PROTECTOR LASIK FLAP (MISCELLANEOUS) ×2 IMPLANT
SOL BAL SALT 15ML (MISCELLANEOUS) ×4
SOLUTION BAL SALT 15ML (MISCELLANEOUS) ×2 IMPLANT
SPONGE SURG I SPEAR (MISCELLANEOUS) ×6 IMPLANT
SUT ETHILON 10-0 CS-B-6CS-B-6 (SUTURE)
SUT ETHILON 8 0 TG100 8 (SUTURE) ×2 IMPLANT
SUT VICRYL 7 0 TG140 8 (SUTURE) ×2 IMPLANT
SUT VICRYL 8 0 BV 130 5 (SUTURE) ×2 IMPLANT
SUTURE EHLN 10-0 CS-B-6CS-B-6 (SUTURE) IMPLANT
SYR 3ML LL SCALE MARK (SYRINGE) ×6 IMPLANT
SYRINGE 10CC LL (SYRINGE) ×2 IMPLANT
TAPERED BLUNT TIP STR 20-23GA (MISCELLANEOUS) ×2
TUTOPLAST SCIERA 0.5X1.0 (Tissue) ×2 IMPLANT
VALVE GLAUCOMA AHMED (Prosthesis & Implant Heart) ×2 IMPLANT
WATER STERILE IRR 250ML POUR (IV SOLUTION) ×2 IMPLANT
WIPE NON LINTING 3.25X3.25 (MISCELLANEOUS) ×2 IMPLANT

## 2016-01-05 NOTE — H&P (Signed)
H+P reviewed and is up to date, please see paper chart.  

## 2016-01-05 NOTE — Anesthesia Postprocedure Evaluation (Signed)
Anesthesia Post Note  Patient: Jodi Colon  Procedure(s) Performed: Procedure(s) (LRB): AQUEOUS SHUNT (Left)  Patient location during evaluation: PACU Anesthesia Type: MAC Level of consciousness: awake and alert and oriented Pain management: satisfactory to patient Vital Signs Assessment: post-procedure vital signs reviewed and stable Respiratory status: spontaneous breathing, nonlabored ventilation and respiratory function stable Cardiovascular status: blood pressure returned to baseline and stable Postop Assessment: Adequate PO intake and No signs of nausea or vomiting Anesthetic complications: no    Raliegh Ip

## 2016-01-05 NOTE — Anesthesia Preprocedure Evaluation (Signed)
Anesthesia Evaluation  Patient identified by MRN, date of birth, ID band Patient awake    Reviewed: Allergy & Precautions, H&P , NPO status , Patient's Chart, lab work & pertinent test results  Airway Mallampati: II  TM Distance: >3 FB Neck ROM: full    Dental no notable dental hx.    Pulmonary    Pulmonary exam normal        Cardiovascular hypertension, + Peripheral Vascular Disease  Normal cardiovascular exam     Neuro/Psych CVA    GI/Hepatic GERD  ,  Endo/Other    Renal/GU Renal disease     Musculoskeletal   Abdominal   Peds  Hematology   Anesthesia Other Findings   Reproductive/Obstetrics                             Anesthesia Physical  Anesthesia Plan  ASA: III  Anesthesia Plan: MAC   Post-op Pain Management:    Induction:   Airway Management Planned:   Additional Equipment:   Intra-op Plan:   Post-operative Plan:   Informed Consent: I have reviewed the patients History and Physical, chart, labs and discussed the procedure including the risks, benefits and alternatives for the proposed anesthesia with the patient or authorized representative who has indicated his/her understanding and acceptance.     Plan Discussed with:   Anesthesia Plan Comments:         Anesthesia Quick Evaluation  

## 2016-01-05 NOTE — Anesthesia Procedure Notes (Signed)
Procedure Name: MAC Performed by: Mackinley Kiehn Pre-anesthesia Checklist: Patient identified, Emergency Drugs available, Suction available, Timeout performed and Patient being monitored Patient Re-evaluated:Patient Re-evaluated prior to inductionOxygen Delivery Method: Nasal cannula Placement Confirmation: positive ETCO2     

## 2016-01-05 NOTE — Op Note (Signed)
Date of Surgery: 01/05/16  PREOPERATIVE DIAGNOSES: 1. Medically uncontrolled ocular hypertension, left eye.  POSTOPERATIVE DIAGNOSES: 1. Same  PROCEDURE PERFORMED: 1. Ahmed drainage device placement, left eye. 2. Coverage of glaucoma drainage device with Tutoplast sclera, left eye.  SURGEON: Almon Hercules, MD.    ANESTHESIA: Monitored anesthesia care.  IMPLANTS:   Implant Name Type Inv. Item Serial No. Manufacturer Lot No. LRB No. Used  VALVE GLAUCOMA AHMED - [id number] Prosthesis & Implant Heart VALVE GLAUCOMA AHMED [id number] [employer] [id number] Left 1  TUTOPLAST [place] 0.5X1.0 - [id number] Tissue TUTOPLAST SCIERA 0.5X1.0 [id number] [employer] [id number] Left 1    COMPLICATIONS: None.  DESCRIPTION OF PROCEDURE: After informed consent was obtained, the patient was brought to the operating room and placed in the supine position.  The patient was then prepped and draped in the usual sterile fashion for intraocular surgery on the right eye.  A wire lid speculum was placed.  A 7-0 vicryl suture was placed through the superotemporal limbal cornea and the eye was rotated to expose the superotemporal quadrant.  Using Westcott scissors, a small incision through conjunctiva and Tenons was made in the superotemporal quadrant approximately 4 mm posterior to the limbus. A block which consisted of 2 mL of 50% of 4% Xylocaine without epinephrine and 50% of 0.75% Marcaine was given at sub-Tenons level. Tenons were then dissected from sclera posteriorly and anteriorly with blunt Westcott dissection. Hemostasis was achieved with cautery.  An Ahmed drainage device, model FP7, was removed from its packaging, inspected, and found to be in good condition.  Balanced salt solution on a cannula was used to irrigate the tube, and free flow was noted above the plate. The implant was placed in the retrobulbar space between the superior and lateral rectus muscles and two 8-0 nylon sutures were placed through the  eyelets of the implant. A 22-gauge needle was used to enter the anterior chamber 3 mm from the superior limbus supero-temporally.  The tube was trimmed to length andplaced through the needle tract and set to rest above the iris with no corneal touch noted.  The tube was then approximated to the globe using a single 8-0 nylon figure-of- eight suture.  Donor scleral overlay placed over the tube and sewn in place using 1 interrupted 7-0 vicryl suture.  The conjunctival incision was closed with running 8-0 Vicryl sutures.  At the end of the case, the corneal limbal traction suture was removed as was the wire lid speculum.  The eye was dressed with an application of Maxitrol ointment, and a Fox shield was placed.  The patient was brought to the recovery area having tolerated the procedure with no complications.

## 2016-01-05 NOTE — Transfer of Care (Signed)
Immediate Anesthesia Transfer of Care Note  Patient: Jodi Colon  Procedure(s) Performed: Procedure(s) with comments: AQUEOUS SHUNT (Left) - AHMED VALVE WITH SCLERAL PATCH GRAFT  Patient Location: PACU  Anesthesia Type: MAC  Level of Consciousness: awake, alert  and patient cooperative  Airway and Oxygen Therapy: Patient Spontanous Breathing and Patient connected to supplemental oxygen  Post-op Assessment: Post-op Vital signs reviewed, Patient's Cardiovascular Status Stable, Respiratory Function Stable, Patent Airway and No signs of Nausea or vomiting  Post-op Vital Signs: Reviewed and stable  Complications: No apparent anesthesia complications

## 2016-01-06 ENCOUNTER — Encounter: Payer: Self-pay | Admitting: Ophthalmology

## 2016-01-07 ENCOUNTER — Encounter: Payer: Self-pay | Admitting: Urology

## 2016-01-07 ENCOUNTER — Ambulatory Visit (INDEPENDENT_AMBULATORY_CARE_PROVIDER_SITE_OTHER): Payer: Medicare Other | Admitting: Urology

## 2016-01-07 VITALS — BP 91/53 | HR 62 | Ht <= 58 in | Wt 107.4 lb

## 2016-01-07 DIAGNOSIS — R350 Frequency of micturition: Secondary | ICD-10-CM

## 2016-01-07 DIAGNOSIS — R300 Dysuria: Secondary | ICD-10-CM | POA: Diagnosis not present

## 2016-01-07 DIAGNOSIS — C679 Malignant neoplasm of bladder, unspecified: Secondary | ICD-10-CM

## 2016-01-08 ENCOUNTER — Encounter: Payer: Self-pay | Admitting: Urology

## 2016-01-08 NOTE — Progress Notes (Signed)
8:36 PM   01/07/16  Jodi Colon 22-Oct-1929 [id number]  Referring provider: Tracie Harrier, MD [address] [facility] [place], [place] [address]  Chief Complaint  Patient presents with  . 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  She returns to the office today s/p bladder biopsy on 12/24/15 which confirms presence of recurrent CIS to discuss pathology and further treatment.  Jodi Colon is  previously a patient of Dr. Bernardo Heater at [facility] followed by Dr. Elnoria Howard [facility]). She was diagnosed with high-grade TA TCC of the bladder in 2011. She underwent induction BCG 6 and briefly on maintenance. She then had an abnormal urine cytology on September 2014 which was consistent with CIS. She then underwent induction with only 4 treatments but did not complete the full course due to a nationwide backorder of the medication. She developed another recurrence and returned to the OR on 05/20/15 for cystoscopy, bilateral retrograde pyelogram (normal) and bladder biopsy x 6 performed by Dr. Erlene Quan. 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  She completed another induction series of BCG on 08/14/2015 x 6.  Of note, this course was very poorly tolerated.  Follow-up repeat biopsies on 12/24/2015 confirmed the persistence of carcinoma in situ diffusely throughout the bladder.  Bilateral retrograde pyelogram was normal.  She does have severe baseline symptoms of overactive bladder, incontinence, and severe persistent dysuria. Per notes, she is been tried on DDAVP and ?botox.  She is currently Uribell and mybetriq  which do not help much with her symptoms. Her quality of life is declined greatly due to her severe urinary symptoms.  She had numerous UA/urine cultures most of which grown no bacteria.  She was treated for an Escherichia coli urinary tract infection on  10/16/2015 most recently.  Recent culture was subsequently negative.  Incidentally, she did have a recent CT abdomen pelvis with contrast in 08/2015 which showed no evidence of metastatic disease. Her bladder wall was thickened.     She was seen in consultation at [facility] urology by Dr. Carla Drape for consideration for possible cystectomy given concern for BCG failure and multiple previous failures. I discussed the case with him and he has concerns that she would not tolerate cystectomy given her history of stroke and overall comorbidities.  PMH: Past Medical History:  Diagnosis Date  . Adnexal cyst   . Arthritis   . Bladder cancer ([place]) 04/2015  . Brain aneurysm    small, followed with regular angiograms, Dr. Estanislado Pandy  . Breast cancer [facility]) 2007   Right Breast Lumpectomy  . Cerebrovascular disease 2008   s/p PTCA/stent of MCA, [place]  . Chronic kidney disease   . DCIS (ductal carcinoma in situ) of breast 2007   Right, s/p lumpectomy/xrt   . DDD (degenerative disc disease), cervical   . Degenerative joint disease    managed by the [facility]  . GERD (gastroesophageal reflux disease)   . Headache   . History of hiatal hernia   . History of MRSA infection   . History of TIA (transient ischemic attack)   . Hx MRSA infection   . Hyperlipidemia   . Hypertension   . Incomplete bladder emptying   . 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 ([place])   . MRSA (methicillin resistant Staphylococcus aureus) 2008   perineal abscess  . Nocturia   . Radiation 2007  BREAST CA  . Spinal stenosis   . Stroke ([place])   . Thyroid disease   . Urge incontinence     Surgical History: Past Surgical History:  Procedure Laterality Date  . ABDOMINAL HYSTERECTOMY    . AQUEOUS SHUNT Left 01/05/2016   Procedure: AQUEOUS SHUNT;  Surgeon: Ronnell Freshwater, MD;  Location: [place];  Service: Ophthalmology;  Laterality: Left;  AHMED VALVE WITH SCLERAL PATCH GRAFT  . BACK  SURGERY    . BLADDER SURGERY  2012, 2014  . BRAIN SURGERY Right 2008   stent, [facility] [place]  . BREAST LUMPECTOMY    . CATARACT EXTRACTION    . CHOLECYSTECTOMY    . COLONOSCOPY  2013  . CYSTOSCOPY W/ RETROGRADES Bilateral 05/20/2015   Procedure: CYSTOSCOPY WITH RETROGRADE PYELOGRAM;  Surgeon: Hollice Espy, MD;  Location: [facility];  Service: Urology;  Laterality: Bilateral;  . CYSTOSCOPY W/ RETROGRADES Bilateral 12/24/2015   Procedure: CYSTOSCOPY WITH RETROGRADE PYELOGRAM;  Surgeon: Hollice Espy, MD;  Location: [facility];  Service: Urology;  Laterality: Bilateral;  . CYSTOSCOPY WITH BIOPSY N/A 05/20/2015   Procedure: CYSTOSCOPY WITH BIOPSY;  Surgeon: Hollice Espy, MD;  Location: [facility];  Service: Urology;  Laterality: N/A;  . CYSTOSCOPY WITH BIOPSY N/A 12/24/2015   Procedure: CYSTOSCOPY WITH BIOPSY;  Surgeon: Hollice Espy, MD;  Location: [facility];  Service: Urology;  Laterality: N/A;  . EYE SURGERY Bilateral    Cataract Extraction  . FRACTURE SURGERY Left    Ankle, compound fracture  . HEMORRHOID SURGERY  02/01/13  . LAPAROSCOPIC HYSTERECTOMY    . TONSILLECTOMY      Home Medications:    Medication List       Accurate as of 01/07/16 11:59 PM. Always use your most recent med list.          amLODipine 5 MG tablet Commonly known as:  NORVASC Take 5 mg by mouth daily.   aspirin 81 MG tablet Take 81 mg by mouth daily.   atorvastatin 20 MG tablet Commonly known as:  LIPITOR Take 20 mg by mouth every morning.   ferrous sulfate 325 (65 FE) MG EC tablet Take 325 mg by mouth daily with breakfast.   gabapentin 100 MG capsule Commonly known as:  NEURONTIN Take 200 mg by mouth every morning. Take 400 mg at bedtime   HYDROcodone-acetaminophen 7.5-325 MG tablet Commonly known as:  NORCO Take 1 tablet by mouth 3 (three) times daily as needed for moderate pain.   latanoprost 0.005 % ophthalmic solution Commonly known as:  XALATAN   levothyroxine 50 MCG  tablet Commonly known as:  SYNTHROID, LEVOTHROID Take 50-75 mcg by mouth See admin instructions. Take one tablet by mouth daily except on sundays take one and one half by mouth. Per Med record   lisinopril 40 MG tablet Commonly known as:  PRINIVIL,ZESTRIL Take 1 tablet (40 mg total) by mouth daily.   methazolamide 50 MG tablet Commonly known as:  NEPTAZANE Take 50 mg by mouth 3 (three) times daily.   multivitamin tablet Take 1 tablet by mouth daily.   PROTONIX 40 MG tablet Generic drug:  pantoprazole Take 40 mg by mouth 2 (two) times daily.   timolol 0.5 % ophthalmic solution Commonly known as:  TIMOPTIC   tiZANidine 2 MG tablet Commonly known as:  ZANAFLEX Take 2 mg by mouth at bedtime.   topiramate 25 MG tablet Commonly known as:  TOPAMAX Take 25 mg by mouth daily.   Venlafaxine HCl 150 MG Tb24 Take  1 tablet by mouth daily.       Allergies:  Allergies  Allergen Reactions  . Penicillins Rash    Rash Rash  . Prednisone     Other reaction(s): Unknown Patient reported this date that she was allergic to PCN, rather than Prednisone.  Patient reported this date that she was allergic to PCN, rather than Prednisone.   Sarina Ill [Bactrim] Itching  . Sulfasalazine Swelling    "swelling of face"  . Sulfa Antibiotics Rash  . Sulfacetamide Sodium Rash  . Sulfamethoxazole-Trimethoprim Rash    Family History: Family History  Problem Relation Age of Onset  . Fibromyalgia Daughter   . Breast cancer Neg Hx     Social History:  reports that she has never smoked. She has never used smokeless tobacco. She reports that she does not drink alcohol or use drugs.   Physical Exam: BP (!) 91/53   Pulse 62   Ht 4\' 9"  (1.448 m)   Wt 107 lb 6.4 oz (48.7 kg)   BMI 23.24 kg/m   Constitutional:  Alert and oriented, No acute distress.  Accompanied today by her son. HEENT: [place] AT, moist mucus membranes.  Trachea midline, no masses. Cardiovascular: No clubbing, cyanosis, or edema.   Respiratory: Normal respiratory effort, no increased work of breathing.  Skin: No rashes, bruises or suspicious lesions. Neurologic: Grossly intact, no focal deficits, moving all 4 extremities. Psychiatric: Normal mood and affect.  Laboratory Data: Comprehensive Metabolic Panel (CMP) (Q000111Q 10:43 AM) Comprehensive Metabolic Panel (CMP) (Q000111Q 10:43 AM)  Component Value Ref Range  Glucose 84 70 - 110 mg/dL  Sodium 133 (L) 136 - 145 mmol/L  Potassium 4.3 3.6 - 5.1 mmol/L  Chloride 98 97 - 109 mmol/L  Carbon Dioxide (CO2) 23.5 22.0 - 32.0 mmol/L  Urea Nitrogen (BUN) 19 7 - 25 mg/dL  Creatinine 0.9 0.6 - 1.1 mg/dL   CBC w/auto Differential (5 Part) (10/14/2015 10:43 AM) CBC w/auto Differential (5 Part) (10/14/2015 10:43 AM)  Component Value Ref Range  WBC (White Blood Cell Count) 6.0 4.1 - 10.2 10^3/uL  RBC (Red Blood Cell Count) 3.67 (L) 4.04 - 5.48 10^6/uL  Hemoglobin 11.5 (L) 12.0 - 15.0 gm/dL  Hematocrit 34.4 (L) 35.0 - 47.0 %  MCV (Mean Corpuscular Volume) 93.7 80.0 - 100.0 fl  MCH (Mean Corpuscular Hemoglobin) 31.3 (H) 27.0 - 31.2 pg  MCHC (Mean Corpuscular Hemoglobin Concentration) 33.4 32.0 - 36.0 gm/dL  Platelet Count 259 150 - 450 10^3/uL     Urinalysis Results for orders placed or performed during the hospital encounter of 12/24/15  Surgical pathology  Result Value Ref Range   SURGICAL PATHOLOGY      Surgical Pathology CASE: [id number] PATIENT: Julieanne Radi Surgical Pathology Report     SPECIMEN SUBMITTED: A. Bladder biopsy  CLINICAL HISTORY: None provided  PRE-OPERATIVE DIAGNOSIS: History of bladder neoplasm  POST-OPERATIVE DIAGNOSIS: Same as pre-op     DIAGNOSIS: A. BLADDER; BIOPSY: - UROTHELIAL CARCINOMA IN SITU.   GROSS DESCRIPTION:  A. Labeled: bladder biopsy  Tissue fragment(s): 5  Size: 0.1-0.3 cm  Description: tan fragments  Entirely submitted in 1 cassette(s).    Final Diagnosis performed by Delorse Lek, MD.   Electronically signed 12/25/2015 10:49:28AM    The electronic signature indicates that the named Attending Pathologist has evaluated the specimen  Technical component performed at [facility], [address] [place], [place] [address] Lab: [phone] Dir: Darrick Penna. Evette Doffing, MD  Professional component performed at [facility], [place],  [place], [place] [address] Lab: [id number] D ir: Dellia Nims. Reuel Derby, MD      Assessment & Plan:   1. Malignant neoplasm of overlapping sites of bladder [facility])-  Long history of recurrent high-grade bladder cancer/CIS status post at least 2 full induction courses of BCG along with a partial induction course with evidence of persistent/ recurrent disease on bladder bx 11/2015.  No evidence of metastatic diease on recent CT abd/ pelvis.    Recent consult at [facility] confirms that she is not an ideal candidate for cystectomy.  We discussed options removing for his second line intravesical chemotherapy today. I would recommend either gemcitabine or valsartan.  Ideally, should like to receive this at [facility].  Plan to discuss and facilitate this with the cancer center for logistical purposes. We will schedule for an induction course 6 weeks with repeat cystoscopy in 3 months. We discussed the risk and benefits in detail. She her son are agreeable with the plan.  2. Dysuria:    Suspect this is more likely related to underlying bladder cancer, bladder irritation/inflammation.  Continue supportive care with Mybetriq and Uribell.  3. Frequency:  See above.      Hollice Espy, MD  [facility] [address], [place], [place] [address] [phone]

## 2016-01-22 ENCOUNTER — Other Ambulatory Visit: Payer: Self-pay

## 2016-01-22 ENCOUNTER — Telehealth: Payer: Self-pay

## 2016-01-22 DIAGNOSIS — R300 Dysuria: Secondary | ICD-10-CM

## 2016-01-22 MED ORDER — URIBEL 118 MG PO CAPS: 118.0000 mg | ORAL_CAPSULE | Freq: Four times a day (QID) | ORAL | 3 refills | Status: DC

## 2016-01-22 NOTE — Telephone Encounter (Signed)
Pt called stating she was under the impression Dr. Erlene Quan was supposed to be contacting the cancer center in reference to her bladder cancer. Pt wanted to know how that was coming along. Please advise.

## 2016-01-23 ENCOUNTER — Other Ambulatory Visit: Payer: Self-pay | Admitting: Internal Medicine

## 2016-02-03 ENCOUNTER — Telehealth: Payer: Self-pay | Admitting: Urology

## 2016-02-03 NOTE — Telephone Encounter (Addendum)
Intravesical gemcitabine protocol:  1) Patient should be premedicated with B&O suppository x 1 prior Foley placement  2) 16 Fr Foley to be placed using standard sterile techniques, balloon filled with 10 cc sterile water  3) Instill bladder with 40 mg gemcitabine diluted in 40 mL/mL, total of 2 g intravesically and clamp off Foley  4) Total dwell time 2 hours  5) q15 min position changes from sitting, lying, alternating side to side  6) Foley to be drained and removed once treatment complete   Treatment course for induction 6 weeks to be started next week if possible.  Thanks for your help with this.    Hollice Espy, MD

## 2016-02-03 NOTE — Telephone Encounter (Signed)
Working on getting it set up.  Expect phone call in near future.    Hollice Espy, MD

## 2016-02-09 ENCOUNTER — Telehealth: Payer: Self-pay

## 2016-02-09 NOTE — Telephone Encounter (Signed)
  Oncology Nurse Navigator Documentation  Navigator Location: [facility] (02/09/16 1100) Navigator Encounter Type: Telephone;Other (02/09/16 1100) Telephone: Appt Confirmation/Clarification (02/09/16 1100)                                        Time Spent with Patient: 15 (02/09/16 1100)   Called and spoke with Clarise Cruz at [facility] regarding intravesical chemo planned for this Thursday. Infusion needs patient to arrive at 10:45 for proper education and preparation prior to injection. Would they like [place] to notify patient regarding the appt and would Dr Erlene Quan need any labs prior to appt.. She will speak with Dr Erlene Quan and get back with me.

## 2016-02-09 NOTE — Telephone Encounter (Signed)
  Oncology Nurse Navigator Documentation  Navigator Location: [facility] (02/09/16 1200) Navigator Encounter Type: Telephone (02/09/16 1200) Telephone: Appt Confirmation/Clarification (02/09/16 1200)                                        Time Spent with Patient: 15 (02/09/16 1200)   Voicemail left for Jodi Colon to return call to schedule for intravesical chemo Thursday 9/14 at 10:45

## 2016-02-10 ENCOUNTER — Telehealth: Payer: Self-pay

## 2016-02-10 NOTE — Telephone Encounter (Signed)
  Oncology Nurse Navigator Documentation  Navigator Location: [facility] (02/10/16 1000) Navigator Encounter Type: Telephone (02/10/16 1000) Telephone: Appt Confirmation/Clarification (02/10/16 1000)                                        Time Spent with Patient: 15 (02/10/16 1000)   Voicemail left for Jodi Colon to return call for procedure scheduled this Thursday. Direct phone number left.

## 2016-02-10 NOTE — Telephone Encounter (Signed)
  Oncology Nurse Navigator Documentation  Navigator Location: [facility] (02/10/16 1600) Navigator Encounter Type: Telephone (02/10/16 1600) Telephone: Appt Confirmation/Clarification (02/10/16 1600)                                        Time Spent with Patient: 15 (02/10/16 1600)   Received call back from Ms. Jodi Colon. She has received call back from Dr Erlene Quan and all of her questions have been answered. Notified of appt Thursday 9/14 at 10:45 at the cancer center. Directions given.

## 2016-02-11 ENCOUNTER — Other Ambulatory Visit: Payer: Self-pay | Admitting: Oncology

## 2016-02-11 ENCOUNTER — Other Ambulatory Visit: Payer: Self-pay | Admitting: Internal Medicine

## 2016-02-12 ENCOUNTER — Inpatient Hospital Stay: Payer: Medicare Other | Attending: Oncology

## 2016-02-12 ENCOUNTER — Other Ambulatory Visit: Payer: Self-pay | Admitting: Internal Medicine

## 2016-02-12 VITALS — BP 102/50 | HR 72 | Temp 97.2°F | Resp 16

## 2016-02-12 DIAGNOSIS — Z5111 Encounter for antineoplastic chemotherapy: Secondary | ICD-10-CM | POA: Diagnosis not present

## 2016-02-12 DIAGNOSIS — C679 Malignant neoplasm of bladder, unspecified: Secondary | ICD-10-CM | POA: Diagnosis present

## 2016-02-12 MED ORDER — BELLADONNA ALKALOIDS-OPIUM 16.2-60 MG RE SUPP: 1.0000 | Freq: Once | RECTAL | Status: DC

## 2016-02-12 MED ORDER — SODIUM CHLORIDE 0.9 % IV SOLN: 2000.0000 mg | Freq: Once | INTRAVENOUS | Status: DC

## 2016-02-12 MED ORDER — BELLADONNA ALKALOIDS-OPIUM 16.2-60 MG RE SUPP
1.0000 | Freq: Once | RECTAL | Status: AC
  Administered 2016-02-12: 1 via RECTAL

## 2016-02-12 MED ORDER — GEMCITABINE HCL CHEMO INJECTION 1 GM
2000.0000 mg | Freq: Once | INTRAVENOUS | Status: AC
Start: 2016-02-12 — End: 2016-02-12
  Administered 2016-02-12: 2000 mg via INTRAVENOUS
  Filled 2016-02-12: qty 52.63

## 2016-02-18 ENCOUNTER — Telehealth: Payer: Self-pay

## 2016-02-18 NOTE — Telephone Encounter (Signed)
  Oncology Nurse Navigator Documentation Received call from Ms. Jeziorski to confirm 11:30 appt for 9/21 in infusion. Confirmed appt with her.  Navigator Location: [facility] (02/18/16 1500) Navigator Encounter Type: Telephone (02/18/16 1500) Telephone: Appt Confirmation/Clarification;Incoming Call (02/18/16 1500)                                        Time Spent with Patient: 15 (02/18/16 1500)

## 2016-02-19 ENCOUNTER — Inpatient Hospital Stay: Payer: Medicare Other

## 2016-02-19 DIAGNOSIS — C679 Malignant neoplasm of bladder, unspecified: Secondary | ICD-10-CM

## 2016-02-19 MED ORDER — GEMCITABINE HCL CHEMO INJECTION 1 GM
2000.0000 mg | Freq: Once | INTRAVENOUS | Status: AC
Start: 2016-02-19 — End: 2016-02-19
  Administered 2016-02-19: 2000 mg via INTRAVENOUS
  Filled 2016-02-19: qty 53

## 2016-02-19 MED ORDER — BELLADONNA ALKALOIDS-OPIUM 16.2-60 MG RE SUPP
1.0000 | Freq: Once | RECTAL | Status: DC
Start: 2016-02-19 — End: 2016-02-19

## 2016-02-19 MED ORDER — BELLADONNA ALKALOIDS-OPIUM 16.2-30 MG RE SUPP
30.0000 mg | Freq: Once | RECTAL | Status: AC
  Administered 2016-02-19: 30 mg via RECTAL

## 2016-02-19 MED ORDER — SODIUM CHLORIDE 0.9 % IV SOLN: 2000.0000 mg | Freq: Once | INTRAVENOUS | Status: DC

## 2016-02-19 MED ORDER — WHITE PETROLATUM EX GEL
Freq: Once | CUTANEOUS | Status: AC
Start: 2016-02-19 — End: 2016-02-19
  Administered 2016-02-19: 12:00:00 via TOPICAL
  Filled 2016-02-19 (×2): qty 30

## 2016-02-19 MED ORDER — BELLADONNA ALKALOIDS-OPIUM 16.2-30 MG RE SUPP
30.0000 mg | Freq: Once | RECTAL | Status: AC
  Filled 2016-02-19: qty 1

## 2016-02-19 MED ORDER — BELLADONNA ALKALOIDS-OPIUM 16.2-60 MG RE SUPP: 1.0000 | Freq: Once | RECTAL | Status: DC

## 2016-02-19 MED ORDER — BELLADONNA ALKALOIDS-OPIUM 16.2-60 MG RE SUPP
1.0000 | Freq: Once | RECTAL | Status: DC
  Filled 2016-02-19: qty 1

## 2016-02-20 MED FILL — Belladonna Alkaloids & Opium Suppos 16.2-60 MG: RECTAL | Qty: 1 | Status: AC

## 2016-02-25 ENCOUNTER — Telehealth: Payer: Self-pay

## 2016-02-25 NOTE — Telephone Encounter (Signed)
  Oncology Nurse Navigator Documentation Voicemail left with Jodi Colon to confirm chemo appt tomorrow. Navigator Location: [facility] (02/25/16 1300) Navigator Encounter Type: Telephone (02/25/16 1300) Telephone: Appt Confirmation/Clarification (02/25/16 1300)                                        Time Spent with Patient: 15 (02/25/16 1300)

## 2016-02-26 ENCOUNTER — Inpatient Hospital Stay: Payer: Medicare Other

## 2016-02-26 DIAGNOSIS — C679 Malignant neoplasm of bladder, unspecified: Secondary | ICD-10-CM | POA: Diagnosis not present

## 2016-02-26 MED ORDER — BELLADONNA ALKALOIDS-OPIUM 16.2-60 MG RE SUPP
1.0000 | Freq: Once | RECTAL | Status: AC
Start: 2016-02-26 — End: 2016-02-26
  Administered 2016-02-26: 1 via RECTAL
  Filled 2016-02-26: qty 1

## 2016-02-26 MED ORDER — GEMCITABINE HCL CHEMO INJECTION 1 GM
2000.0000 mg | Freq: Once | INTRAVENOUS | Status: AC
  Administered 2016-02-26: 2000 mg via INTRAVENOUS
  Filled 2016-02-26: qty 52.6

## 2016-02-26 MED ORDER — WHITE PETROLATUM EX GEL
Freq: Once | CUTANEOUS | Status: AC
  Administered 2016-02-26: 11:00:00 via TOPICAL
  Filled 2016-02-26: qty 1

## 2016-02-26 NOTE — Progress Notes (Unsigned)
16 Fr foley placed, immediate return of clear urine, patient tolerated well

## 2016-03-02 ENCOUNTER — Telehealth: Payer: Self-pay | Admitting: Urology

## 2016-03-02 NOTE — Telephone Encounter (Signed)
Pt called stating that she has an appt this upcoming Friday with you to discuss cancer treatment options, Pt states she feels she does not need to keep this appt because " Dr. Erlene Quan " has already set up chemo treatment appointments that she has already started and will have a chemo treatment done this upcoming Thursday. At the pt request, I have cancelled the appt, please let me know if pt still needs to be seen. Just wanted you to be aware.

## 2016-03-04 ENCOUNTER — Telehealth: Payer: Self-pay | Admitting: Urology

## 2016-03-04 ENCOUNTER — Inpatient Hospital Stay: Payer: Medicare Other | Attending: Oncology

## 2016-03-04 VITALS — BP 130/68 | HR 58 | Temp 97.8°F | Resp 18

## 2016-03-04 DIAGNOSIS — C679 Malignant neoplasm of bladder, unspecified: Secondary | ICD-10-CM | POA: Diagnosis present

## 2016-03-04 DIAGNOSIS — Z5111 Encounter for antineoplastic chemotherapy: Secondary | ICD-10-CM | POA: Insufficient documentation

## 2016-03-04 MED ORDER — WHITE PETROLATUM EX GEL
Freq: Once | CUTANEOUS | Status: AC
  Administered 2016-03-04: 12:00:00 via TOPICAL
  Filled 2016-03-04: qty 1

## 2016-03-04 MED ORDER — DIAZEPAM 10 MG PO TABS: 10.0000 mg | ORAL_TABLET | Freq: Once | ORAL | 0 refills | Status: AC

## 2016-03-04 MED ORDER — BELLADONNA ALKALOIDS-OPIUM 16.2-60 MG RE SUPP
1.0000 | Freq: Once | RECTAL | Status: AC
  Administered 2016-03-04: 1 via RECTAL
  Filled 2016-03-04: qty 1

## 2016-03-04 MED ORDER — GEMCITABINE HCL CHEMO INJECTION 1 GM
2000.0000 mg | Freq: Once | INTRAVENOUS | Status: AC
  Administered 2016-03-04: 2000 mg via INTRAVENOUS
  Filled 2016-03-04: qty 53

## 2016-03-04 MED ORDER — BELLADONNA ALKALOIDS-OPIUM 16.2-60 MG RE SUPP
1.0000 | Freq: Once | RECTAL | Status: DC
  Filled 2016-03-04: qty 1

## 2016-03-04 NOTE — Telephone Encounter (Signed)
Please schedule this patient for lab visit Monday at 1:30 to drop off urine for UA/ UCx (she knows to come at this time).  Also, please provide her with a valium prescription, 10 mg once x 2 refills for pre-procedure anxiety (have script up front).  Hollice Espy, MD

## 2016-03-04 NOTE — Telephone Encounter (Signed)
done

## 2016-03-05 ENCOUNTER — Ambulatory Visit: Payer: Medicare Other | Admitting: Urology

## 2016-03-05 MED FILL — White Petrolatum Gel: Qty: 28.35 | Status: AC

## 2016-03-08 ENCOUNTER — Encounter: Payer: Self-pay | Admitting: Urology

## 2016-03-08 ENCOUNTER — Other Ambulatory Visit: Payer: Self-pay

## 2016-03-08 ENCOUNTER — Ambulatory Visit: Payer: Medicare Other

## 2016-03-08 DIAGNOSIS — C679 Malignant neoplasm of bladder, unspecified: Secondary | ICD-10-CM

## 2016-03-08 MED ORDER — DIAZEPAM 10 MG PO TABS
10.0000 mg | ORAL_TABLET | Freq: Once | ORAL | 0 refills | Status: AC
Start: 2016-03-08 — End: 2016-03-08

## 2016-03-09 ENCOUNTER — Ambulatory Visit (INDEPENDENT_AMBULATORY_CARE_PROVIDER_SITE_OTHER): Payer: Medicare Other

## 2016-03-09 VITALS — BP 159/76 | HR 71 | Temp 97.5°F | Wt 107.0 lb

## 2016-03-09 DIAGNOSIS — R350 Frequency of micturition: Secondary | ICD-10-CM | POA: Diagnosis not present

## 2016-03-09 LAB — URINALYSIS, COMPLETE
Bilirubin, UA: NEGATIVE
Glucose, UA: NEGATIVE
Ketones, UA: NEGATIVE
Nitrite, UA: NEGATIVE
Specific Gravity, UA: 1.01 (ref 1.005–1.030)
Urobilinogen, Ur: 0.2 mg/dL (ref 0.2–1.0)
pH, UA: 6.5 (ref 5.0–7.5)

## 2016-03-09 LAB — MICROSCOPIC EXAMINATION
RBC, UA: >30 /hpf — AB (ref 0–?)
WBC, UA: >30 /hpf — AB (ref 0–?)

## 2016-03-09 NOTE — Progress Notes (Signed)
Pt came in today with c/o urinary frequency, urinary urgency, hard to postpone urine, burning on urination, and leakage of urine. A clean catch urine was obtained for u/a and cx.   Blood pressure (!) 159/76, pulse 71, temperature 97.5 F (36.4 C), weight 107 lb (48.5 kg).

## 2016-03-11 ENCOUNTER — Inpatient Hospital Stay: Payer: Medicare Other

## 2016-03-11 VITALS — BP 108/58 | HR 58 | Temp 97.0°F | Resp 18

## 2016-03-11 DIAGNOSIS — C670 Malignant neoplasm of trigone of bladder: Secondary | ICD-10-CM

## 2016-03-11 LAB — CULTURE, URINE COMPREHENSIVE

## 2016-03-11 MED ORDER — BELLADONNA ALKALOIDS-OPIUM 16.2-30 MG RE SUPP: 30.0000 mg | Freq: Once | RECTAL | Status: AC

## 2016-03-11 MED ORDER — WHITE PETROLATUM EX OINT
1.0000 "application " | TOPICAL_OINTMENT | Freq: Once | CUTANEOUS | Status: AC
  Filled 2016-03-11: qty 30

## 2016-03-11 MED ORDER — GEMCITABINE HCL CHEMO INJECTION 2 GM
2000.0000 mg | Freq: Once | INTRAVENOUS | Status: AC
  Filled 2016-03-11 (×2): qty 53

## 2016-03-15 ENCOUNTER — Telehealth: Payer: Self-pay

## 2016-03-15 DIAGNOSIS — F419 Anxiety disorder, unspecified: Secondary | ICD-10-CM

## 2016-03-15 NOTE — Telephone Encounter (Signed)
Pt called stating she will be needing another valium for this Thursday's tx. Pt also requested ucx results. Made aware results were back but unsure if tx is needed. Made pt aware you are in surgery in [facility] and will not have an answer til tomorrow. Pt stated that she is burning really bad. Please advise.

## 2016-03-15 NOTE — Telephone Encounter (Signed)
No evidence of infection, she has chronic dysuria.  Please have shannon write script for valium 10 mg x 1.    Hollice Espy, MD

## 2016-03-17 MED ORDER — DIAZEPAM 10 MG PO TABS: 10.0000 mg | ORAL_TABLET | Freq: Once | ORAL | 0 refills | Status: AC

## 2016-03-17 NOTE — Telephone Encounter (Signed)
Spoke with pt in reference to dysuria and valium. Pt voiced understanding.

## 2016-03-18 ENCOUNTER — Inpatient Hospital Stay: Payer: Medicare Other

## 2016-03-18 DIAGNOSIS — C670 Malignant neoplasm of trigone of bladder: Secondary | ICD-10-CM

## 2016-03-18 DIAGNOSIS — C679 Malignant neoplasm of bladder, unspecified: Secondary | ICD-10-CM | POA: Diagnosis not present

## 2016-03-18 MED ORDER — BELLADONNA ALKALOIDS-OPIUM 16.2-30 MG RE SUPP
30.0000 mg | Freq: Once | RECTAL | Status: AC
  Administered 2016-03-18: 30 mg via RECTAL

## 2016-03-18 MED ORDER — GEMCITABINE HCL CHEMO INJECTION 2 GM
2000.0000 mg | Freq: Once | INTRAVENOUS | Status: AC
  Administered 2016-03-18: 2000 mg via INTRAVENOUS
  Filled 2016-03-18: qty 52.6

## 2016-05-05 ENCOUNTER — Ambulatory Visit (INDEPENDENT_AMBULATORY_CARE_PROVIDER_SITE_OTHER): Payer: Medicare Other | Admitting: Urology

## 2016-05-05 ENCOUNTER — Encounter: Payer: Self-pay | Admitting: Urology

## 2016-05-05 VITALS — BP 152/77 | HR 71 | Ht <= 58 in | Wt 110.0 lb

## 2016-05-05 DIAGNOSIS — R300 Dysuria: Secondary | ICD-10-CM

## 2016-05-05 DIAGNOSIS — C679 Malignant neoplasm of bladder, unspecified: Secondary | ICD-10-CM

## 2016-05-05 DIAGNOSIS — R350 Frequency of micturition: Secondary | ICD-10-CM

## 2016-05-05 LAB — URINALYSIS, COMPLETE
Bilirubin, UA: NEGATIVE
Glucose, UA: NEGATIVE
Ketones, UA: NEGATIVE
Nitrite, UA: NEGATIVE
Specific Gravity, UA: 1.015 (ref 1.005–1.030)
Urobilinogen, Ur: 0.2 mg/dL (ref 0.2–1.0)
pH, UA: 6 (ref 5.0–7.5)

## 2016-05-05 LAB — MICROSCOPIC EXAMINATION
Bacteria, UA: NONE SEEN
WBC, UA: >30 /hpf — AB (ref 0–?)

## 2016-05-05 MED ORDER — LIDOCAINE HCL 2 % EX GEL
1.0000 "application " | Freq: Once | CUTANEOUS | Status: AC
  Administered 2016-05-05: 1 via URETHRAL

## 2016-05-05 MED ORDER — CIPROFLOXACIN HCL 500 MG PO TABS
500.0000 mg | ORAL_TABLET | Freq: Once | ORAL | Status: AC
Start: 2016-05-05 — End: 2016-05-05
  Administered 2016-05-05: 500 mg via ORAL

## 2016-05-05 NOTE — Progress Notes (Signed)
3:02 PM   05/05/16   Jodi Colon [birth year] [id number]  Referring provider: Tracie Harrier, [facility] [address] [facility] [place], [place] [address]  Chief Complaint  Patient presents with  . 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  She recently completed a course of intravesical gemcitabine 6 weeks about which was not well tolerated.  She returns for cystoscopy today.    Jodi Colon is  previously a patient of Dr. Bernardo Colon at [facility] followed by Dr. Elnoria Howard [facility]). She was diagnosed with high-grade TA TCC of the bladder in 2011. She underwent induction BCG 6 and briefly on maintenance. She then had an abnormal urine cytology on September 2014 which was consistent with CIS. She then underwent induction with only 4 treatments but did not complete the full course due to a nationwide backorder of the medication. She developed another recurrence and returned to the OR on 05/20/15 for cystoscopy, bilateral retrograde pyelogram (normal) and bladder biopsy x 6 performed by [facility]. 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  She completed another induction series of BCG on 08/14/2015 x 6.  Of note, this course was very poorly tolerated.  Follow-up repeat biopsies on 12/24/2015 confirmed the persistence of carcinoma in situ diffusely throughout the bladder.  Bilateral retrograde pyelogram was normal.   She was seen in consultation at [facility] by Dr. Carla [facility] for consideration for possible cystectomy given concern for BCG failure and multiple previous failures. I discussed the case with him and he has concerns that she would not tolerate cystectomy given her history of stroke and overall comorbidities.  As an alterative, she completed a course of intravesical Gemcitabine weekly 6 weeks. Unfortunately, this was not well tolerated. She was premedicated with Valium and a  B&O suppository. Despite this, on at least one occasion, she was not able to hold the medication in her bladder.    She continues to have severe baseline symptoms of overactive bladder, incontinence, and severe persistent dysuria. Per notes, she is been tried on DDAVP and ?botox.  She is currently Uribell and mybetriq  which do not help much with her symptoms. Her quality of life is declined greatly due to her severe urinary symptoms.  Most recent upper tract imaging in the form of CT abdomen pelvis with contrast in 08/2015 which showed no evidence of metastatic disease. Her bladder wall was thickened.     PMH: Past Medical History:  Diagnosis Date  . Adnexal cyst   . Arthritis   . Bladder cancer ([place]) 04/2015  . Brain aneurysm    small, followed with regular angiograms, Dr. Estanislado Colon  . Breast cancer [facility]) 2007   Right Breast Lumpectomy  . Cerebrovascular disease 2008   s/p PTCA/stent of MCA, [facility]  . Chronic kidney disease   . DCIS (ductal carcinoma in situ) of breast 2007   Right, s/p lumpectomy/xrt   . DDD (degenerative disc disease), cervical   . Degenerative joint disease    [facility] by the [facility]  . GERD (gastroesophageal reflux disease)   . Headache   . History of hiatal hernia   . History of MRSA infection   . History of TIA (transient ischemic attack)   . Hx MRSA infection   . Hyperlipidemia   . Hypertension   . Incomplete bladder emptying   . 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 ([place])   . MRSA (methicillin  resistant Staphylococcus aureus) 2008   perineal abscess  . Nocturia   . Radiation 2007   BREAST CA  . Spinal stenosis   . Stroke ([place])   . Thyroid disease   . Urge incontinence     Surgical History: Past Surgical History:  Procedure Laterality Date  . ABDOMINAL HYSTERECTOMY    . AQUEOUS SHUNT Left 01/05/2016   Procedure: AQUEOUS SHUNT;  Surgeon: Jodi Colon;  Location: [place];  Service:  Ophthalmology;  Laterality: Left;  AHMED VALVE WITH SCLERAL PATCH GRAFT  . BACK SURGERY    . BLADDER SURGERY  2012, 2014  . BRAIN SURGERY Right 2008   stent, [facility], [place]  . BREAST LUMPECTOMY    . CATARACT EXTRACTION    . CHOLECYSTECTOMY    . COLONOSCOPY  2013  . CYSTOSCOPY W/ RETROGRADES Bilateral 05/20/2015   Procedure: CYSTOSCOPY WITH RETROGRADE PYELOGRAM;  Surgeon: Jodi Espy, Colon;  Location: [facility];  Service: Colon;  Laterality: Bilateral;  . CYSTOSCOPY W/ RETROGRADES Bilateral 12/24/2015   Procedure: CYSTOSCOPY WITH RETROGRADE PYELOGRAM;  Surgeon: Jodi Espy, Colon;  Location: [facility];  Service: Colon;  Laterality: Bilateral;  . CYSTOSCOPY WITH BIOPSY N/A 05/20/2015   Procedure: CYSTOSCOPY WITH BIOPSY;  Surgeon: Jodi Espy, Colon;  Location: [facility];  Service: Colon;  Laterality: N/A;  . CYSTOSCOPY WITH BIOPSY N/A 12/24/2015   Procedure: CYSTOSCOPY WITH BIOPSY;  Surgeon: Jodi Espy, Colon;  Location: [facility];  Service: Colon;  Laterality: N/A;  . EYE SURGERY Bilateral    Cataract Extraction  . FRACTURE SURGERY Left    Ankle, compound fracture  . HEMORRHOID SURGERY  02/01/13  . LAPAROSCOPIC HYSTERECTOMY    . TONSILLECTOMY      Home Medications:    Medication List       Accurate as of 05/05/16  3:02 PM. Always use your most recent med list.          amLODipine 5 MG tablet Commonly known as:  NORVASC Take 5 mg by mouth daily.   aspirin 81 MG tablet Take 81 mg by mouth daily.   atorvastatin 20 MG tablet Commonly known as:  LIPITOR Take 20 mg by mouth every morning.   ferrous sulfate 325 (65 FE) MG EC tablet Take 325 mg by mouth daily with breakfast.   gabapentin 100 MG capsule Commonly known as:  NEURONTIN Take 200 mg by mouth every morning. Take 400 mg at bedtime   HYDROcodone-acetaminophen 7.5-325 MG tablet Commonly known as:  NORCO Take 1 tablet by mouth 3 (three) times daily as needed for moderate pain.   latanoprost 0.005 %  ophthalmic solution Commonly known as:  XALATAN   levothyroxine 50 MCG tablet Commonly known as:  SYNTHROID, LEVOTHROID Take 50-75 mcg by mouth See admin instructions. Take one tablet by mouth daily except on sundays take one and one half by mouth. Per Med record   lisinopril 40 MG tablet Commonly known as:  PRINIVIL,ZESTRIL Take 1 tablet (40 mg total) by mouth daily.   methazolamide 50 MG tablet Commonly known as:  NEPTAZANE Take 50 mg by mouth 3 (three) times daily.   multivitamin tablet Take 1 tablet by mouth daily.   PROTONIX 40 MG tablet Generic drug:  pantoprazole Take 40 mg by mouth 2 (two) times daily.   timolol 0.5 % ophthalmic solution Commonly known as:  TIMOPTIC   tiZANidine 2 MG tablet Commonly known as:  ZANAFLEX Take 2 mg by mouth at bedtime.   topiramate 25 MG tablet  Commonly known as:  TOPAMAX Take 25 mg by mouth daily.   URIBEL 118 MG Caps Take 1 capsule (118 mg total) by mouth QID.   Venlafaxine HCl 150 MG Tb24 Take 1 tablet by mouth daily.       Allergies:  Allergies  Allergen Reactions  . Penicillins Rash    Rash Rash  . Prednisone     Other reaction(s): Unknown Patient reported this date that she was allergic to PCN, rather than Prednisone.  Patient reported this date that she was allergic to PCN, rather than Prednisone.   Sarina Ill [Bactrim] Itching  . Sulfasalazine Swelling    "swelling of face"  . Sulfa Antibiotics Rash  . Sulfacetamide Sodium Rash  . Sulfamethoxazole-Trimethoprim Rash    Family History: Family History  Problem Relation Age of Onset  . Fibromyalgia Daughter   . Breast cancer Neg Hx     Social History:  reports that she has never smoked. She has never used smokeless tobacco. She reports that she does not drink alcohol or use drugs.   Physical Exam: BP (!) 152/77   Pulse 71   Ht 4\' 9"  (1.448 m)   Wt 110 lb (49.9 kg)   BMI 23.80 kg/m   Constitutional:  Alert and oriented, No acute distress.  HEENT: [place]  AT, moist mucus membranes.  Trachea midline, no masses.  Large bruise over her right forehead from recent fall. Cardiovascular: No clubbing, cyanosis, or edema.  Respiratory: Normal respiratory effort, no increased work of breathing.  GU: Atrophic external genitalia. Skin: No rashes or suspicious lesions. Neurologic: Grossly intact, no focal deficits, moving all 4 extremities. Psychiatric: Normal mood and affect.  Urinalysis Results for orders placed or performed in visit on 05/05/16  Microscopic Examination  Result Value Ref Range   WBC, UA >30 (A) 0 - 5 /hpf   RBC, UA 0-2 0 - 2 /hpf   Epithelial Cells (non renal) 0-10 0 - 10 /hpf   Renal Epithel, UA 0-10 (A) None seen /hpf   Bacteria, UA None seen None seen/Few  Urinalysis, Complete  Result Value Ref Range   Specific Gravity, UA 1.015 1.005 - 1.030   pH, UA 6.0 5.0 - 7.5   Color, UA Yellow Yellow   Appearance Ur Hazy (A) Clear   Leukocytes, UA 1+ (A) Negative   Protein, UA Trace (A) Negative/Trace   Glucose, UA Negative Negative   Ketones, UA Negative Negative   RBC, UA Trace (A) Negative   Bilirubin, UA Negative Negative   Urobilinogen, Ur 0.2 0.2 - 1.0 mg/dL   Nitrite, UA Negative Negative   Microscopic Examination See below:     Cystoscopy Procedure Note  Patient identification was confirmed, informed consent was obtained, and patient was prepped using Betadine solution.  Lidocaine jelly was administered per urethral meatus.    Preoperative abx where received prior to procedure.    Procedure: - Flexible cystoscope introduced, without any difficulty.   - Thorough search of the bladder revealed:    normal urethral meatus     Urothelium highly irregular, diffuse patchy erythema throughout entire bladder. Some areas of early papillary change highly concerning for CIS/more aggressive recurrence.    no stones    no ulcers     no tumors    no urethral polyps    no trabeculation  - Ureteral orifices were normal in  position and appearance.  Post-Procedure: - Cystoscopy was not well tolerated today  Assessment & Plan:   1. Malignant neoplasm  of overlapping sites of bladder [facility])-  Long history of recurrent high-grade bladder cancer/CIS status post at least 2 full induction courses of BCG along with a partial induction course with evidence of persistent/ recurrent disease on bladder bx 11/2015.  Most recently underwent an induction course with gemcitabine intravesically 6 with what appears to be persistent disease on cystoscopy today.  I recommended returning to the operating room for repeat biopsy to confirm this finding along with bilateral retrograde pyelogram.   Risk/ benefits were reviewed again today, she is quite familiar with this procedure.  Patient may be a candidate for clinical trial at [facility] for in investigational intravesical medication, will follow up with this.  2. Dysuria:    Suspect this is more likely related to underlying bladder cancer, bladder irritation/inflammation.  Continue supportive care with Mybetriq and Uribell.  3. Frequency:  See above.  Very small bladder capacity contributing to above.    Schedule bladder biopsy, bilateral RTG  Jodi Espy, Colon  [facility] [address] [place], [place] [address] [phone]

## 2016-05-06 ENCOUNTER — Telehealth: Payer: Self-pay

## 2016-05-06 ENCOUNTER — Other Ambulatory Visit: Payer: Self-pay | Admitting: Radiology

## 2016-05-06 DIAGNOSIS — C679 Malignant neoplasm of bladder, unspecified: Secondary | ICD-10-CM

## 2016-05-06 NOTE — Telephone Encounter (Signed)
Pt called stating she is not going to have bladder bx performed at this time. Pt requested all appts be canceled.

## 2016-05-07 ENCOUNTER — Inpatient Hospital Stay: Admission: RE | Admit: 2016-05-07 | Payer: Medicare Other | Source: Ambulatory Visit

## 2016-05-10 NOTE — Telephone Encounter (Signed)
Cancelled bladder biopsy & pre-admit appt per pt request.

## 2016-05-11 LAB — CULTURE, URINE COMPREHENSIVE

## 2016-05-17 ENCOUNTER — Encounter: Admission: RE | Payer: Self-pay | Source: Ambulatory Visit

## 2016-05-17 ENCOUNTER — Ambulatory Visit: Admission: RE | Admit: 2016-05-17 | Payer: Medicare Other | Source: Ambulatory Visit | Admitting: Urology

## 2016-05-17 SURGERY — CYSTOSCOPY, WITH BIOPSY
Anesthesia: Choice

## 2016-06-07 ENCOUNTER — Telehealth: Payer: Self-pay | Admitting: Urology

## 2016-06-07 NOTE — Telephone Encounter (Signed)
Pt has questions and concerns about her cancer and other treatment options. Also asks about a trial study at [facility] that was discussed before Christmas.  Earliest 30 min appt is 3/7, I have made appt for [facility] but she wants to speak to you before then. Pt requested appt to see you with her children to discuss further. Please advise. Thanks.

## 2016-06-08 NOTE — Telephone Encounter (Signed)
Spoke with patient and I moved her appt to 06-17-16 at 11:00   [facility]

## 2016-06-08 NOTE — Telephone Encounter (Signed)
Get her to come in on late Thursday morning apt (maybe next Thursday at 11).  Unfortunately, she did not qualify for the study.    Hollice Espy, MD

## 2016-06-17 ENCOUNTER — Ambulatory Visit: Payer: Medicare Other | Admitting: Urology

## 2016-06-24 ENCOUNTER — Encounter: Payer: Self-pay | Admitting: Urology

## 2016-06-24 ENCOUNTER — Ambulatory Visit (INDEPENDENT_AMBULATORY_CARE_PROVIDER_SITE_OTHER): Payer: Medicare Other | Admitting: Urology

## 2016-06-24 VITALS — BP 178/73 | HR 67 | Ht <= 58 in | Wt 108.0 lb

## 2016-06-24 DIAGNOSIS — R300 Dysuria: Secondary | ICD-10-CM

## 2016-06-24 DIAGNOSIS — N3281 Overactive bladder: Secondary | ICD-10-CM | POA: Diagnosis not present

## 2016-06-24 DIAGNOSIS — C679 Malignant neoplasm of bladder, unspecified: Secondary | ICD-10-CM

## 2016-06-24 MED ORDER — URIBEL 118 MG PO CAPS: 118.0000 mg | ORAL_CAPSULE | Freq: Four times a day (QID) | ORAL | 3 refills | Status: DC

## 2016-06-24 NOTE — Progress Notes (Signed)
1:08 PM   06/24/16   Jodi Colon 01-12-1930 [id number]  Referring provider: Tracie Harrier, MD [address] [place], [place] [address]  Bladder cancer, urinary symptoms  HPI: 83 year old Caucasian female with history of recurrent bladder cancer with recurrent CIS refractory of BCG.  She returns today with her family to discuss her family discuss primarily her severe voiding symptoms but also further management of her bladder cancer.  [facility] is  previously a patient of Dr. Bernardo Heater at [facility] followed by Dr. Elnoria Howard [facility]). She was diagnosed with high-grade TA TCC of the bladder in 2011. She underwent induction BCG 6 and briefly on maintenance. She then had an abnormal urine cytology on September 2014 which was consistent with CIS. She then underwent induction with only 4 treatments but did not complete the full course due to a nationwide backorder of the medication. She developed another recurrence and returned to the OR on 05/20/15 for cystoscopy, bilateral retrograde pyelogram (normal) and bladder biopsy x 6 performed by Dr. Erlene Quan. 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  She completed another induction series of BCG on 08/14/2015 x 6.  Of note, this course was very poorly tolerated.  Follow-up repeat biopsies on 12/24/2015 confirmed the persistence of carcinoma in situ diffusely throughout the bladder.  Bilateral retrograde pyelogram was normal.   She was seen in consultation at [facility] by Dr. Carla Drape for consideration for possible cystectomy given concern for BCG failure and multiple previous failures. I discussed the case with him and he has concerns that she would not tolerate cystectomy given her history of stroke and overall comorbidities.  As an alterative, she completed a course of intravesical Gemcitabine weekly 6 weeks. Unfortunately, this was not well tolerated. She was premedicated with Valium and a B&O  suppository. Despite this, on at least one occasion, she was not able to hold the medication in her bladder.    She continues to have severe baseline symptoms of overactive bladder, incontinence, and severe persistent dysuria. Per notes, she is been tried on DDAVP and botox.  She is currently Uribell and mybetriq  which do not help much with her symptoms. Her quality of life is declined greatly due to her severe urinary symptoms.  She no longer goes outward does the things that she enjoys because of her bladder issues.  Most recent cystoscopy in 05/05/16 highly concerning for persistent disease.    Most recent upper tract imaging in the form of CT abdomen pelvis with contrast in 08/2015 which showed no evidence of metastatic disease. Her bladder wall was thickened.    She is not a candidate for investigational study at [facility] with bladder immunotherapy.   PMH: Past Medical History:  Diagnosis Date  . Adnexal cyst   . Arthritis   . Bladder cancer ([place]) 04/2015  . Brain aneurysm    small, followed with regular angiograms, Dr. Estanislado Pandy  . Breast cancer [facility]) 2007   Right Breast Lumpectomy  . Cerebrovascular disease 2008   s/p PTCA/stent of MCA, [place]  . Chronic kidney disease   . DCIS (ductal carcinoma in situ) of breast 2007   Right, s/p lumpectomy/xrt   . DDD (degenerative disc disease), cervical   . Degenerative joint disease    [facility] by the [facility]  . GERD (gastroesophageal reflux disease)   . Headache   . History of hiatal hernia   . History of MRSA infection   . History of TIA (transient ischemic attack)   .  Hx MRSA infection   . Hyperlipidemia   . Hypertension   . Incomplete bladder emptying   . 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 ([place])   . MRSA (methicillin resistant Staphylococcus aureus) 2008   perineal abscess  . Nocturia   . Radiation 2007   BREAST CA  . Spinal stenosis   . Stroke ([place])   . Thyroid disease   . Urge incontinence      Surgical History: Past Surgical History:  Procedure Laterality Date  . ABDOMINAL HYSTERECTOMY    . AQUEOUS SHUNT Left 01/05/2016   Procedure: AQUEOUS SHUNT;  Surgeon: Ronnell Freshwater, MD;  Location: [place];  Service: Ophthalmology;  Laterality: Left;  AHMED VALVE WITH SCLERAL PATCH GRAFT  . BACK SURGERY    . BLADDER SURGERY  2012, 2014  . BRAIN SURGERY Right 2008   stent, [facility], [place]  . BREAST LUMPECTOMY    . CATARACT EXTRACTION    . CHOLECYSTECTOMY    . COLONOSCOPY  2013  . CYSTOSCOPY W/ RETROGRADES Bilateral 05/20/2015   Procedure: CYSTOSCOPY WITH RETROGRADE PYELOGRAM;  Surgeon: Hollice Espy, MD;  Location: [facility];  Service: Urology;  Laterality: Bilateral;  . CYSTOSCOPY W/ RETROGRADES Bilateral 12/24/2015   Procedure: CYSTOSCOPY WITH RETROGRADE PYELOGRAM;  Surgeon: Hollice Espy, MD;  Location: [facility];  Service: Urology;  Laterality: Bilateral;  . CYSTOSCOPY WITH BIOPSY N/A 05/20/2015   Procedure: CYSTOSCOPY WITH BIOPSY;  Surgeon: Hollice Espy, MD;  Location: [facility];  Service: Urology;  Laterality: N/A;  . CYSTOSCOPY WITH BIOPSY N/A 12/24/2015   Procedure: CYSTOSCOPY WITH BIOPSY;  Surgeon: Hollice Espy, MD;  Location: [facility];  Service: Urology;  Laterality: N/A;  . EYE SURGERY Bilateral    Cataract Extraction  . FRACTURE SURGERY Left    Ankle, compound fracture  . HEMORRHOID SURGERY  02/01/13  . LAPAROSCOPIC HYSTERECTOMY    . TONSILLECTOMY      Home Medications:  Allergies as of 06/24/2016      Reactions   Penicillins Rash   Rash Rash   Prednisone    Other reaction(s): Unknown Patient reported this date that she was allergic to PCN, rather than Prednisone.  Patient reported this date that she was allergic to PCN, rather than Prednisone.    Septra [bactrim] Itching   Sulfasalazine Swelling   "swelling of face"   Sulfa Antibiotics Rash   Sulfacetamide Sodium Rash   Sulfamethoxazole-trimethoprim Rash      Medication List        Accurate as of 06/24/16  1:08 PM. Always use your most recent med list.          aspirin 81 MG tablet Take 81 mg by mouth daily.   atorvastatin 20 MG tablet Commonly known as:  LIPITOR Take 20 mg by mouth every morning.   gabapentin 100 MG capsule Commonly known as:  NEURONTIN Take 200 mg by mouth every morning. Take 400 mg at bedtime   HYDROcodone-acetaminophen 5-325 MG tablet Commonly known as:  NORCO/VICODIN 1 po qHS prn Earliest Fill Date: 08/18/16 Start taking on:  08/18/2016   latanoprost 0.005 % ophthalmic solution Commonly known as:  XALATAN   levothyroxine 50 MCG tablet Commonly known as:  SYNTHROID, LEVOTHROID Take 50-75 mcg by mouth See admin instructions. Take one tablet by mouth daily except on sundays take one and one half by mouth. Per Med record   methazolamide 50 MG tablet Commonly known as:  NEPTAZANE Take 50 mg by mouth 3 (three) times daily.  multivitamin tablet Take 1 tablet by mouth daily.   PROTONIX 40 MG tablet Generic drug:  pantoprazole Take 40 mg by mouth 2 (two) times daily.   rOPINIRole 0.5 MG tablet Commonly known as:  REQUIP   timolol 0.5 % ophthalmic solution Commonly known as:  TIMOPTIC   tiZANidine 2 MG tablet Commonly known as:  ZANAFLEX Take 2 mg by mouth at bedtime.   URIBEL 118 MG Caps Take 1 capsule (118 mg total) by mouth QID.   Venlafaxine HCl 150 MG Tb24 Take 1 tablet by mouth daily.       Allergies:  Allergies  Allergen Reactions  . Penicillins Rash    Rash Rash  . Prednisone     Other reaction(s): Unknown Patient reported this date that she was allergic to PCN, rather than Prednisone.  Patient reported this date that she was allergic to PCN, rather than Prednisone.   Sarina Ill [Bactrim] Itching  . Sulfasalazine Swelling    "swelling of face"  . Sulfa Antibiotics Rash  . Sulfacetamide Sodium Rash  . Sulfamethoxazole-Trimethoprim Rash    Family History: Family History  Problem Relation Age of  Onset  . Fibromyalgia Daughter   . Breast cancer Neg Hx     Social History:  reports that she has never smoked. She has never used smokeless tobacco. She reports that she does not drink alcohol or use drugs.   Physical Exam: BP (!) 178/73   Pulse 67   Ht 4\' 9"  (1.448 m)   Wt 108 lb (49 kg)   BMI 23.37 kg/m   Constitutional:  Alert and oriented, No acute distress. Accompanied today by his son and daughter-in-law. HEENT: [place] AT, moist mucus membranes.  Trachea midline, no masses.  Cardiovascular: No clubbing, cyanosis, or edema.  Respiratory: Normal respiratory effort, no increased work of breathing.  Skin: No rashes or suspicious lesions. Neurologic: Grossly intact, no focal deficits, moving all 4 extremities. Psychiatric: Normal mood and affect.   Assessment & Plan:   1. Malignant neoplasm of overlapping sites of bladder [facility])-  Long history of recurrent high-grade bladder cancer/CIS status post at least 2 full induction courses of BCG along with a partial induction course with evidence of persistent/ recurrent disease on bladder bx 11/2015.  Most recently underwent an induction course with gemcitabine intravesically 6.    Cystoscopy 04/2016 with what appears to be persistent disease/   Unfortunate, she is not a candidate for cystectomy per Dr Ricky Ala. Additional, she is not a candidate for clinical trial.  She is not interested in further intravesical therapy as they're very poorly tolerated.   Declined to return to the OR for confirmatory biopsy given this will not change our management.  Plan for repeat CT urogram for upper tract evaluation as well as staging.   2. Dysuria:    Lkely related to underlying bladder cancer, bladder irritation/inflammation.  Continue supportive care with Mybetriq and Uribell.  3. Frequency:  Very small bladder capacity contributing to above.   We had a lengthy discussion today regarding options moving forward. She will not tolerate indwelling  Foley or suprapubic tube due to bladder irritability.  Botox with active cancer is relatively contraindicated. We did briefly discuss PT and estimate.  I will have her see my partner, Dr. Phebe Colla to discuss if he falls she may a possible candidate for ileal urinary diversion. We discussed the procedure today in detail. This is less morbid than cystectomy with diversion and may be better tolerated. She was given  information on this procedure today in detail. All questions were answered.   Return in about 4 weeks (around 07/22/2016) for Dr. Tresa Moore for consideration of palliative diversion.   Hollice Espy, MD  [place] [address] [place], [place] [address] [phone]  I spent 30 min with this patient of which greater than 50% was spent in counseling and coordination of care with the patient.

## 2016-06-24 NOTE — Patient Instructions (Addendum)
Carcinoma in situ of the bladder, CIS.  Stage I, pTcis    Cystectomy With Ileal Conduit Urinary Diversion, Care After Introduction This sheet gives you information about how to care for yourself after your procedure. Your health care provider may also give you more specific instructions. If you have problems or questions, contact your health care provider. What can I expect after the procedure? After the procedure, it is common to have:  Pain in your abdomen, in the incision area, or around the opening (stoma) that was created during the procedure.  Nausea.  Fatigue.  Swelling, redness, or slight bleeding around the stoma. Follow these instructions at home: Activity  Rest as needed. Try to take short walks every day for the amount of time that your health care provider suggests.  Return to your normal activities as told by your health care provider. Ask your health care provider what activities are safe for you.  Do not lift anything that is heavier than 10 lb (4.5 kg) until your health care provider says that it is safe.  Do not play contact sports.  Do not do abdominal exercises or activities that require a lot of energy until your health care provider tells you it is safe.  Do not drive until your health care provider says it is okay. Incision and stoma care  Follow instructions from your health care provider about how to take care of your stoma and incision area. Make sure you:  Wash your hands with soap and water before you change your bandage (dressing). If soap and water are not available, use hand sanitizer.  Change your dressing as told by your health care provider.  Leave stitches (sutures), skin glue, or adhesive strips in place. These skin closures may need to stay in place for 2 weeks or longer. If adhesive strip edges start to loosen and curl up, you may trim the loose edges. Do not remove adhesive strips completely unless your health care provider tells you to do  that.  Check the stoma and incision area every day for signs of infection. Check for:  More redness, swelling, or pain.  More fluid or blood.  Warmth.  Pus or a bad smell.  Clean and dry the skin around the stoma each time you change the plastic bag that holds urine (urostomy pouch). To clean the stoma area:  Use warm water and only use cleansers that your health care provider says are okay to use.  Rinse the stoma area with plain water.  Dry it well.  If your health care provider gives you a powder or gel to put on your skin, use it as directed. Do not use any other powders, gels, wipes, or creams on your skin.  Measure the stoma opening as often as told by your health care provider. Record the size each time, and watch for changes. Share this information with your health care provider. Bathing  Do not take baths, swim, or use a hot tub until your health care provider approves. Ask your health care provider if you can take showers. You may be able to shower with the urostomy pouch in place.  Avoid using harsh or oily soaps when bathing. Urostomy Pouch Care  Follow instructions from your health care provider about how to empty and change the urostomy pouch.  Wash your hands with soap and water before handling the pouch.  Keep urostomy supplies with you at all times.  Store all supplies in a cool, dry place.  Empty  the urostomy pouch when it is ? to  full.  Replace the pouch every 3-4 days for the first 6 weeks, then every 5-7 days.  Attach your pouch to a larger collection bag at night as told by your health care provider. General instructions  Take over-the-counter and prescription medicines only as told by your health care provider.  Drink enough fluid to keep your urine clear or pale yellow.  You can eat what you usually do. Include plenty of fruits, vegetables, and whole grains in your diet. These can help keep you from being constipated.  Keep all follow-up  visits as told by your health care provider. This is important. Contact a health care provider if:  You have:  Chills or a fever.  Constipation or diarrhea.  Back pain.  More redness, swelling, or pain at the site of your stoma or incision.  More fluid or blood coming from your stoma or incision.  Pus or a bad smell coming from your stoma or incision.  Your pain medicine is not helping.  You feel bloated or nauseous.  Your urine becomes smelly or cloudy.  Your stoma or incision feels warm to the touch.  You see a change in the size or color of the stoma. Get help right away if:  You have:  Very bad pain.  Heavy rectal bleeding.  Black, tarry stools.  A warm, tender area in your leg.  Chest pain or trouble breathing.  You see blood or blood clots in your urostomy pouch.  You have not had a bowel movement for 4 days.  There is no urine coming into your urostomy pouch.  Your incision comes apart. This information is not intended to replace advice given to you by your health care provider. Make sure you discuss any questions you have with your health care provider. Document Released: 05/22/2013 Document Revised: 12/05/2015 Document Reviewed: 08/15/2015  2017 [employer]

## 2016-07-08 ENCOUNTER — Ambulatory Visit
Admission: RE | Admit: 2016-07-08 | Discharge: 2016-07-08 | Disposition: A | Discharge status: Home / Self Care | Payer: Medicare Other | Source: Ambulatory Visit | Attending: Urology | Admitting: Urology

## 2016-07-08 DIAGNOSIS — N329 Bladder disorder, unspecified: Secondary | ICD-10-CM | POA: Diagnosis not present

## 2016-07-08 DIAGNOSIS — I700 Atherosclerosis of aorta: Secondary | ICD-10-CM | POA: Insufficient documentation

## 2016-07-08 DIAGNOSIS — R300 Dysuria: Secondary | ICD-10-CM

## 2016-07-08 LAB — POCT I-STAT CREATININE: Creatinine, Ser: 0.7 mg/dL (ref 0.44–1.00)

## 2016-07-08 MED ORDER — IOPAMIDOL (ISOVUE-300) INJECTION 61%
100.0000 mL | Freq: Once | INTRAVENOUS | Status: AC | PRN
  Administered 2016-07-08: 100 mL via INTRAVENOUS

## 2016-07-14 ENCOUNTER — Encounter: Payer: Self-pay | Admitting: *Deleted

## 2016-07-14 NOTE — Discharge Instructions (Signed)
General Anesthesia, Adult, Care After °These instructions provide you with information about caring for yourself after your procedure. Your health care provider may also give you more specific instructions. Your treatment has been planned according to current medical practices, but problems sometimes occur. Call your health care provider if you have any problems or questions after your procedure. °What can I expect after the procedure? °After the procedure, it is common to have: °· Vomiting. °· A sore throat. °· Mental slowness. °It is common to feel: °· Nauseous. °· Cold or shivery. °· Sleepy. °· Tired. °· Sore or achy, even in parts of your body where you did not have surgery. °Follow these instructions at home: °For at least 24 hours after the procedure: °· Do not: °¨ Participate in activities where you could fall or become injured. °¨ Drive. °¨ Use heavy machinery. °¨ Drink alcohol. °¨ Take sleeping pills or medicines that cause drowsiness. °¨ Make important decisions or sign legal documents. °¨ Take care of children on your own. °· Rest. °Eating and drinking °· If you vomit, drink water, juice, or soup when you can drink without vomiting. °· Drink enough fluid to keep your urine clear or pale yellow. °· Make sure you have little or no nausea before eating solid foods. °· Follow the diet recommended by your health care provider. °General instructions °· Have a responsible adult stay with you until you are awake and alert. °· Return to your normal activities as told by your health care provider. Ask your health care provider what activities are safe for you. °· Take over-the-counter and prescription medicines only as told by your health care provider. °· If you smoke, do not smoke without supervision. °· Keep all follow-up visits as told by your health care provider. This is important. °Contact a health care provider if: °· You continue to have nausea or vomiting at home, and medicines are not helpful. °· You  cannot drink fluids or start eating again. °· You cannot urinate after 8-12 hours. °· You develop a skin rash. °· You have fever. °· You have increasing redness at the site of your procedure. °Get help right away if: °· You have difficulty breathing. °· You have chest pain. °· You have unexpected bleeding. °· You feel that you are having a life-threatening or urgent problem. °This information is not intended to replace advice given to you by your health care provider. Make sure you discuss any questions you have with your health care provider. °Document Released: 08/23/2000 Document Revised: 10/20/2015 Document Reviewed: 05/01/2015 °[employer]vier Interactive Patient Education © 2017 [employer] ° ° °Cataract Surgery, Care After °Refer to this sheet in the next few weeks. These instructions provide you with information about caring for yourself after your procedure. Your health care provider may also give you more specific instructions. Your treatment has been planned according to current medical practices, but problems sometimes occur. Call your health care provider if you have any problems or questions after your procedure. °What can I expect after the procedure? °After the procedure, it is common to have: °· Itching. °· Discomfort. °· Fluid discharge. °· Sensitivity to light and to touch. °· Bruising. °Follow these instructions at home: °Eye Care °· Check your eye every day for signs of infection. Watch for: °¨ Redness, swelling, or pain. °¨ Fluid, blood, or pus. °¨ Warmth. °¨ Bad smell. °Activity °· Avoid strenuous activities, such as playing contact sports, for as long as told by your health care provider. °· Do not   drive or operate heavy machinery until your health care provider approves. °· Do not bend or lift heavy objects . Bending increases pressure in the eye. You can walk, climb stairs, and do light household chores. °· Ask your health care provider when you can return to work. If you work in a dusty  environment, you may be advised to wear protective eyewear for a period of time. °General instructions °· Take or apply over-the-counter and prescription medicines only as told by your health care provider. This includes eye drops. °· Do not touch or rub your eyes. °· If you were given a protective shield, wear it as told by your health care provider. If you were not given a protective shield, wear sunglasses as told by your health care provider to protect your eyes. °· Keep the area around your eye clean and dry. Avoid swimming or allowing water to hit you directly in the face while showering until told by your health care provider. Keep soap and shampoo out of your eyes. °· Do not put a contact lens into the affected eye or eyes until your health care provider approves. °· Keep all follow-up visits as told by your health care provider. This is important. °Contact a health care provider if: ° °· You have increased bruising around your eye. °· You have pain that is not helped with medicine. °· You have a fever. °· You have redness, swelling, or pain in your eye. °· You have fluid, blood, or pus coming from your incision. °· Your vision gets worse. °Get help right away if: °· You have sudden vision loss. °This information is not intended to replace advice given to you by your health care provider. Make sure you discuss any questions you have with your health care provider. °Document Released: 12/04/2004 Document Revised: 09/25/2015 Document Reviewed: 03/27/2015 °Elsevier Interactive Patient Education © 2017 [employer] ° °

## 2016-07-19 ENCOUNTER — Ambulatory Visit
Admission: RE | Admit: 2016-07-19 | Discharge: 2016-07-19 | Disposition: A | Discharge status: Home / Self Care | Payer: Medicare Other | Source: Ambulatory Visit | Attending: Ophthalmology | Admitting: Ophthalmology

## 2016-07-19 ENCOUNTER — Encounter
Admission: RE | Disposition: A | Discharge status: Home / Self Care | Payer: Self-pay | Source: Ambulatory Visit | Attending: Ophthalmology

## 2016-07-19 ENCOUNTER — Ambulatory Visit: Payer: Medicare Other | Admitting: Student in an Organized Health Care Education/Training Program

## 2016-07-19 ENCOUNTER — Encounter: Payer: Self-pay | Admitting: *Deleted

## 2016-07-19 DIAGNOSIS — Z880 Allergy status to penicillin: Secondary | ICD-10-CM | POA: Insufficient documentation

## 2016-07-19 DIAGNOSIS — Z853 Personal history of malignant neoplasm of breast: Secondary | ICD-10-CM | POA: Diagnosis not present

## 2016-07-19 DIAGNOSIS — I10 Essential (primary) hypertension: Secondary | ICD-10-CM | POA: Insufficient documentation

## 2016-07-19 DIAGNOSIS — M1990 Unspecified osteoarthritis, unspecified site: Secondary | ICD-10-CM | POA: Diagnosis not present

## 2016-07-19 DIAGNOSIS — K449 Diaphragmatic hernia without obstruction or gangrene: Secondary | ICD-10-CM | POA: Diagnosis not present

## 2016-07-19 DIAGNOSIS — Z882 Allergy status to sulfonamides status: Secondary | ICD-10-CM | POA: Insufficient documentation

## 2016-07-19 DIAGNOSIS — E7800 Pure hypercholesterolemia, unspecified: Secondary | ICD-10-CM | POA: Insufficient documentation

## 2016-07-19 DIAGNOSIS — K219 Gastro-esophageal reflux disease without esophagitis: Secondary | ICD-10-CM | POA: Insufficient documentation

## 2016-07-19 DIAGNOSIS — G2581 Restless legs syndrome: Secondary | ICD-10-CM | POA: Insufficient documentation

## 2016-07-19 DIAGNOSIS — Z888 Allergy status to other drugs, medicaments and biological substances status: Secondary | ICD-10-CM | POA: Insufficient documentation

## 2016-07-19 DIAGNOSIS — Z8614 Personal history of Methicillin resistant Staphylococcus aureus infection: Secondary | ICD-10-CM | POA: Insufficient documentation

## 2016-07-19 DIAGNOSIS — F329 Major depressive disorder, single episode, unspecified: Secondary | ICD-10-CM | POA: Diagnosis not present

## 2016-07-19 DIAGNOSIS — H401110 Primary open-angle glaucoma, right eye, stage unspecified: Secondary | ICD-10-CM | POA: Insufficient documentation

## 2016-07-19 DIAGNOSIS — Z8551 Personal history of malignant neoplasm of bladder: Secondary | ICD-10-CM | POA: Diagnosis not present

## 2016-07-19 HISTORY — PX: AQUEOUS SHUNT: SHX6305

## 2016-07-19 SURGERY — INSERTION, AQUEOUS SHUNT, EYE
Anesthesia: Monitor Anesthesia Care | Site: Eye | Laterality: Right | Wound class: Clean

## 2016-07-19 MED ORDER — TETRACAINE HCL 0.5 % OP SOLN
OPHTHALMIC | Status: DC | PRN
  Administered 2016-07-19: 2 [drp] via OPHTHALMIC

## 2016-07-19 MED ORDER — LIDOCAINE HCL (PF) 4 % IJ SOLN
INTRAMUSCULAR | Status: DC | PRN
  Administered 2016-07-19: 1 mL via OPHTHALMIC

## 2016-07-19 MED ORDER — NEOMYCIN-POLYMYXIN-DEXAMETH 3.5-10000-0.1 OP OINT
TOPICAL_OINTMENT | OPHTHALMIC | Status: DC | PRN
  Administered 2016-07-19: 1

## 2016-07-19 MED ORDER — ACETAMINOPHEN 325 MG PO TABS: 325.0000 mg | ORAL_TABLET | Freq: Once | ORAL | Status: DC

## 2016-07-19 MED ORDER — MIDAZOLAM HCL 2 MG/2ML IJ SOLN
INTRAMUSCULAR | Status: DC | PRN
  Administered 2016-07-19: 1.5 mg via INTRAVENOUS

## 2016-07-19 MED ORDER — FENTANYL CITRATE (PF) 100 MCG/2ML IJ SOLN
INTRAMUSCULAR | Status: DC | PRN
  Administered 2016-07-19: 50 ug via INTRAVENOUS

## 2016-07-19 MED ORDER — ACETAMINOPHEN 160 MG/5ML PO SOLN
325.0000 mg | Freq: Once | ORAL | Status: DC
Start: 2016-07-19 — End: 2016-07-19

## 2016-07-19 MED ORDER — MOXIFLOXACIN HCL 0.5 % OP SOLN
1.0000 [drp] | OPHTHALMIC | Status: DC | PRN
  Administered 2016-07-19 (×3): 1 [drp] via OPHTHALMIC

## 2016-07-19 SURGICAL SUPPLY — 38 items
ALLOGRAFT TUTOPLST SCER0.5X1.0 (Tissue) ×1 IMPLANT
APPLICATOR COTTON TIP 3IN (MISCELLANEOUS) ×2 IMPLANT
BANDAGE EYE OVAL (MISCELLANEOUS) ×4 IMPLANT
BLADE SCLEROTME MULTI-SIDE (MISCELLANEOUS) IMPLANT
CANNULA ANT/CHMB 27GA (MISCELLANEOUS) ×4 IMPLANT
CORD BIP STRL DISP 12FT (MISCELLANEOUS) ×2 IMPLANT
CUP MEDICINE 2OZ PLAST GRAD ST (MISCELLANEOUS) ×2 IMPLANT
ERASER TAPRD BLUNT STR 20-23GA (MISCELLANEOUS) ×1 IMPLANT
GLOVE BIO SURGEON STRL SZ7 (GLOVE) ×2 IMPLANT
GLOVE SURG LX 6.5 MICRO (GLOVE) ×1
GLOVE SURG LX STRL 6.5 MICRO (GLOVE) ×1 IMPLANT
GOWN STRL REUS W/ TWL LRG LVL3 (GOWN DISPOSABLE) ×2 IMPLANT
GOWN STRL REUS W/TWL LRG LVL3 (GOWN DISPOSABLE) ×2
KNIFE OPTIMUM SIDEPORT 15DEG (MISCELLANEOUS) IMPLANT
KNIFE SIDECUT EYE (MISCELLANEOUS) ×2 IMPLANT
MARKER SKIN DUAL TIP RULER LAB (MISCELLANEOUS) ×2 IMPLANT
NDL SAFETY 22GX1.5 (NEEDLE) ×2 IMPLANT
NEEDLE FILTER BLUNT 18X 1/2SAF (NEEDLE) ×2
NEEDLE FILTER BLUNT 18X1 1/2 (NEEDLE) ×2 IMPLANT
NEEDLE HYPO 30X.5 LL (NEEDLE) IMPLANT
PACK EYE AFTER SURG (MISCELLANEOUS) ×2 IMPLANT
PACK OPTHALMIC (MISCELLANEOUS) ×2 IMPLANT
PROTECTOR LASIK FLAP (MISCELLANEOUS) ×2 IMPLANT
SOL BAL SALT 15ML (MISCELLANEOUS) ×4
SOLUTION BAL SALT 15ML (MISCELLANEOUS) ×2 IMPLANT
SPONGE SURG I SPEAR (MISCELLANEOUS) ×6 IMPLANT
SUT ETHILON 10-0 CS-B-6CS-B-6 (SUTURE)
SUT ETHILON 8 0 TG100 8 (SUTURE) ×2 IMPLANT
SUT VICRYL 7 0 TG140 8 (SUTURE) ×2 IMPLANT
SUT VICRYL 8 0 BV 130 5 (SUTURE) ×2 IMPLANT
SUTURE EHLN 10-0 CS-B-6CS-B-6 (SUTURE) IMPLANT
SYR 3ML LL SCALE MARK (SYRINGE) ×6 IMPLANT
SYRINGE 10CC LL (SYRINGE) ×2 IMPLANT
TAPERED BLUNT TIP STR 20-23GA (MISCELLANEOUS) ×2
TUTOPLAST SCIERA 0.5X1.0 (Tissue) ×2 IMPLANT
VALVE GLAUCOMA AHMED (Prosthesis & Implant Heart) ×2 IMPLANT
WATER STERILE IRR 250ML POUR (IV SOLUTION) ×2 IMPLANT
WIPE NON LINTING 3.25X3.25 (MISCELLANEOUS) ×2 IMPLANT

## 2016-07-19 NOTE — Op Note (Signed)
Date of Surgery: 07/19/16  PREOPERATIVE DIAGNOSES: 1. Primary open angle glaucoma, right eye.  POSTOPERATIVE DIAGNOSES: 1. Same  PROCEDURE PERFORMED: 1. Ahmed drainage device placement, right eye. 2. Coverage of glaucoma drainage device with Tutoplast sclera, right eye.  SURGEON: Almon Hercules, MD.    ANESTHESIA: Monitored anesthesia care.  IMPLANTS:   Implant Name Type Inv. Item Serial No. Manufacturer Lot No. LRB No. Used  TUTOPLAST SCIERA 0.5X1.0 - [id number] Tissue TUTOPLAST SCIERA 0.5X1.0 [id number] RIT [id number] Right 1  VALVE GLAUCOMA AHMED - [id number] Prosthesis & Implant Heart VALVE GLAUCOMA AHMED [id number] [employer] [id number] Right 1    COMPLICATIONS: None.  DESCRIPTION OF PROCEDURE: After informed consent was obtained, the patient was brought to the operating room and placed in the supine position.  The patient was then prepped and draped in the usual sterile fashion for intraocular surgery on the right eye.  A wire lid speculum was placed.  A 7-0 vicryl suture was placed through the superotemporal limbal cornea and the eye was rotated to expose the superotemporal quadrant.  Using Westcott scissors, a small incision through conjunctiva and Tenons was made in the superotemporal quadrant approximately 4 mm posterior to the limbus. A block which consisted of 2 mL of 50% of 4% Xylocaine without epinephrine and 50% of 0.75% Marcaine was given at sub-Tenons level. Tenons were then dissected from sclera posteriorly and anteriorly with blunt Westcott dissection. Hemostasis was achieved with cautery.  An Ahmed drainage device, model FP7, was removed from its packaging, inspected, and found to be in good condition.  Balanced salt solution on a cannula was used to irrigate the tube, and free flow was noted above the plate. The implant was placed in the retrobulbar space between the superior and lateral rectus muscles and two 8-0 nylon sutures were placed through the eyelets of the implant.  A 22-gauge needle was used to enter the anterior chamber 3 mm from the superior limbus supero-temporally.  The tube was trimmed to length and placed through the needle tract and set to rest above the iris with no corneal touch noted.  The tube was then approximated to the globe using a single 8-0 nylon figure-of- eight suture.  Donor scleral overlay placed over the tube and sewn in place using 1 interrupted 7-0 vicryl suture.  The conjunctival incision was closed with running 8-0 Vicryl sutures.  At the end of the case, the corneal limbal traction suture was removed as was the wire lid speculum.  The eye was dressed with an application of Maxitrol ointment, and a Fox shield was placed.  The patient was brought to the recovery area having tolerated the procedure with no complications.

## 2016-07-19 NOTE — Transfer of Care (Signed)
Immediate Anesthesia Transfer of Care Note  Patient: Jodi Colon  Procedure(s) Performed: Procedure(s) with comments: AQUEOUS SHUNT (Right) - Ahmed tube shunt  Patient Location: PACU  Anesthesia Type: MAC  Level of Consciousness: awake, alert  and patient cooperative  Airway and Oxygen Therapy: Patient Spontanous Breathing and Patient connected to supplemental oxygen  Post-op Assessment: Post-op Vital signs reviewed, Patient's Cardiovascular Status Stable, Respiratory Function Stable, Patent Airway and No signs of Nausea or vomiting  Post-op Vital Signs: Reviewed and stable  Complications: No apparent anesthesia complications

## 2016-07-19 NOTE — H&P (Signed)
H+P reviewed and is up to date, please see paper chart.  

## 2016-07-19 NOTE — Anesthesia Procedure Notes (Signed)
Procedure Name: MAC Performed by: Mayme Genta Pre-anesthesia Checklist: Patient identified, Emergency Drugs available, Suction available, Timeout performed and Patient being monitored Patient Re-evaluated:Patient Re-evaluated prior to inductionOxygen Delivery Method: Nasal cannula Placement Confirmation: positive ETCO2

## 2016-07-19 NOTE — Anesthesia Postprocedure Evaluation (Signed)
Anesthesia Post Note  Patient: Jodi Colon  Procedure(s) Performed: Procedure(s) (LRB): AQUEOUS SHUNT (Right)  Patient location during evaluation: PACU Anesthesia Type: MAC Level of consciousness: awake and alert and oriented Pain management: satisfactory to patient Vital Signs Assessment: post-procedure vital signs reviewed and stable Respiratory status: spontaneous breathing, nonlabored ventilation and respiratory function stable Cardiovascular status: blood pressure returned to baseline and stable Postop Assessment: Adequate PO intake and No signs of nausea or vomiting Anesthetic complications: no    Raliegh Ip

## 2016-07-19 NOTE — Anesthesia Preprocedure Evaluation (Signed)
Anesthesia Evaluation  Patient identified by MRN, date of birth, ID band Patient awake    Reviewed: Allergy & Precautions, H&P , NPO status , Patient's Chart, lab work & pertinent test results  Airway Mallampati: II  TM Distance: >3 FB Neck ROM: full    Dental no notable dental hx.    Pulmonary    Pulmonary exam normal        Cardiovascular hypertension, + Peripheral Vascular Disease  Normal cardiovascular exam     Neuro/Psych CVA    GI/Hepatic GERD  ,  Endo/Other    Renal/GU Renal disease     Musculoskeletal   Abdominal   Peds  Hematology   Anesthesia Other Findings   Reproductive/Obstetrics                             Anesthesia Physical  Anesthesia Plan  ASA: III  Anesthesia Plan: MAC   Post-op Pain Management:    Induction:   Airway Management Planned:   Additional Equipment:   Intra-op Plan:   Post-operative Plan:   Informed Consent: I have reviewed the patients History and Physical, chart, labs and discussed the procedure including the risks, benefits and alternatives for the proposed anesthesia with the patient or authorized representative who has indicated his/her understanding and acceptance.     Plan Discussed with:   Anesthesia Plan Comments:         Anesthesia Quick Evaluation

## 2016-07-20 ENCOUNTER — Encounter: Payer: Self-pay | Admitting: Ophthalmology

## 2016-07-28 ENCOUNTER — Encounter: Payer: Self-pay | Admitting: Urology

## 2016-07-28 ENCOUNTER — Ambulatory Visit (INDEPENDENT_AMBULATORY_CARE_PROVIDER_SITE_OTHER): Payer: Medicare Other | Admitting: Urology

## 2016-07-28 VITALS — BP 157/73 | HR 61 | Ht <= 58 in | Wt 111.0 lb

## 2016-07-28 DIAGNOSIS — K429 Umbilical hernia without obstruction or gangrene: Secondary | ICD-10-CM | POA: Diagnosis not present

## 2016-07-28 DIAGNOSIS — R300 Dysuria: Secondary | ICD-10-CM

## 2016-07-28 DIAGNOSIS — C678 Malignant neoplasm of overlapping sites of bladder: Secondary | ICD-10-CM | POA: Diagnosis not present

## 2016-07-28 NOTE — Progress Notes (Signed)
07/28/2016 10:41 AM   Jodi Colon Mar 31, 1930 [id number]  Referring provider: Tracie Harrier, MD [address] [facility] [place], [place] [address]  CC: Discuss bladder cancer  HPI:  1 - Recurrent High Grade Bladder Cancer - long h/o carcinoma in situ since 2011 s/p BCG induciton x2, Gemzar induction x1 (required dose reduction) with recurrence disease. Had opinion from [facility] and not felt cystectomy candidate or clinical trial candidate.  07/2016 - Cr 0.7, CT left bladder wall thickening and standing (?T3) / no pelvic adenopathy or distant disease, Cysto with left wall recurrence.  2 - Umbilical Hernia - small umbilical hernia incidental on CT 2018, fat-containing at time of study.  3 - Refractory Dysuria/ Urinary Urgency - worsening irritative voiding with bladder cancer above and its treatment. UCX negative x many. Tried on numerous urinary analgesics with minimal response. No drable result with DDAVP or even cystso-BTX.  PMH sig for vag-Hyst (benign), lap chole, CVA (minimal deficits). She still lives completely indepentantly, drives, shops, has very supportive family nearby.  Today "Jodi Colon" is seen for opinion on above, specifcally to rediscuss possible cystectomy for curative / palliative intent.    PMH: Past Medical History:  Diagnosis Date  . Adnexal cyst   . Arthritis   . Bladder cancer ([place]) 04/2015  . Brain aneurysm    small, followed with regular angiograms, Dr. Estanislado Pandy  . Breast cancer [facility]) 2007   Right Breast Lumpectomy  . Cerebrovascular disease 2008   s/p PTCA/stent of MCA, [place]  . Chronic kidney disease   . DCIS (ductal carcinoma in situ) of breast 2007   Right, s/p lumpectomy/xrt   . DDD (degenerative disc disease), cervical   . Degenerative joint disease    managed by the [facility]  . GERD (gastroesophageal reflux disease)   . Headache   . History of hiatal hernia   . History of MRSA infection   . History of TIA (transient  ischemic attack)   . Hx MRSA infection   . Hyperlipidemia   . Hypertension   . Incomplete bladder emptying   . 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 ([place])   . MRSA (methicillin resistant Staphylococcus aureus) 2008   perineal abscess  . Nocturia   . Radiation 2007   BREAST CA  . Spinal stenosis   . Stroke ([place])   . Thyroid disease   . Urge incontinence     Surgical History: Past Surgical History:  Procedure Laterality Date  . ABDOMINAL HYSTERECTOMY    . AQUEOUS SHUNT Left 01/05/2016   Procedure: AQUEOUS SHUNT;  Surgeon: Ronnell Freshwater, MD;  Location: [place];  Service: Ophthalmology;  Laterality: Left;  AHMED VALVE WITH SCLERAL PATCH GRAFT  . AQUEOUS SHUNT Right 07/19/2016   Procedure: AQUEOUS SHUNT;  Surgeon: Ronnell Freshwater, MD;  Location: [place];  Service: Ophthalmology;  Laterality: Right;  Ahmed tube shunt  . BACK SURGERY    . BLADDER SURGERY  2012, 2014  . BRAIN SURGERY Right 2008   stent, [facility], [place]  . BREAST LUMPECTOMY    . CATARACT EXTRACTION    . CHOLECYSTECTOMY    . COLONOSCOPY  2013  . CYSTOSCOPY W/ RETROGRADES Bilateral 05/20/2015   Procedure: CYSTOSCOPY WITH RETROGRADE PYELOGRAM;  Surgeon: Hollice Espy, MD;  Location: [facility];  Service: Urology;  Laterality: Bilateral;  . CYSTOSCOPY W/ RETROGRADES Bilateral 12/24/2015   Procedure: CYSTOSCOPY WITH RETROGRADE PYELOGRAM;  Surgeon: Hollice Espy, MD;  Location: [facility];  Service: Urology;  Laterality: Bilateral;  . CYSTOSCOPY WITH BIOPSY N/A 05/20/2015   Procedure: CYSTOSCOPY WITH BIOPSY;  Surgeon: Hollice Espy, MD;  Location: [facility];  Service: Urology;  Laterality: N/A;  . CYSTOSCOPY WITH BIOPSY N/A 12/24/2015   Procedure: CYSTOSCOPY WITH BIOPSY;  Surgeon: Hollice Espy, MD;  Location: [facility];  Service: Urology;  Laterality: N/A;  . EYE SURGERY Bilateral    Cataract Extraction  . FRACTURE SURGERY Left    Ankle, compound fracture    . HEMORRHOID SURGERY  02/01/13  . LAPAROSCOPIC HYSTERECTOMY    . TONSILLECTOMY      Home Medications:  Allergies as of 07/28/2016      Reactions   Penicillins Rash   Prednisone    Other reaction(s): Unknown Patient reported this date that she was allergic to PCN, rather than Prednisone.  Patient reported this date that she was allergic to PCN, rather than Prednisone.    Septra [bactrim] Itching   Sulfasalazine Swelling   "swelling of face"   Sulfa Antibiotics Rash   Sulfacetamide Sodium Rash   Sulfamethoxazole-trimethoprim Rash      Medication List       Accurate as of 07/28/16 10:41 AM. Always use your most recent med list.          aspirin 81 MG tablet Take 81 mg by mouth daily.   atorvastatin 20 MG tablet Commonly known as:  LIPITOR Take 20 mg by mouth every morning.   gabapentin 100 MG capsule Commonly known as:  NEURONTIN Take 200 mg by mouth every morning. Take 400 mg at bedtime   HYDROcodone-acetaminophen 5-325 MG tablet Commonly known as:  NORCO/VICODIN 1 po qHS prn Earliest Fill Date: 08/18/16 Start taking on:  08/18/2016   latanoprost 0.005 % ophthalmic solution Commonly known as:  XALATAN   levothyroxine 50 MCG tablet Commonly known as:  SYNTHROID, LEVOTHROID Take 50-75 mcg by mouth See admin instructions. Take one tablet by mouth daily except on sundays take one and one half by mouth. Per Med record   methazolamide 50 MG tablet Commonly known as:  NEPTAZANE Take 50 mg by mouth 3 (three) times daily.   multivitamin tablet Take 1 tablet by mouth daily.   PROTONIX 40 MG tablet Generic drug:  pantoprazole Take 40 mg by mouth 2 (two) times daily.   rOPINIRole 0.5 MG tablet Commonly known as:  REQUIP   SIMBRINZA OP Apply to eye.   timolol 0.5 % ophthalmic solution Commonly known as:  TIMOPTIC   tiZANidine 2 MG tablet Commonly known as:  ZANAFLEX Take 2 mg by mouth at bedtime.   URIBEL 118 MG Caps Take 1 capsule (118 mg total) by mouth  QID.   Venlafaxine HCl 150 MG Tb24 Take 1 tablet by mouth daily.       Allergies:  Allergies  Allergen Reactions  . Penicillins Rash  . Prednisone     Other reaction(s): Unknown Patient reported this date that she was allergic to PCN, rather than Prednisone.  Patient reported this date that she was allergic to PCN, rather than Prednisone.   Sarina Ill [Bactrim] Itching  . Sulfasalazine Swelling    "swelling of face"  . Sulfa Antibiotics Rash  . Sulfacetamide Sodium Rash  . Sulfamethoxazole-Trimethoprim Rash    Family History: Family History  Problem Relation Age of Onset  . Fibromyalgia Daughter   . Breast cancer Neg Hx     Social History:  reports that she has never smoked. She has never used smokeless tobacco. She  reports that she does not drink alcohol or use drugs.   Review of Systems  Gastrointestinal (upper)  : Negative for upper GI symptoms  Gastrointestinal (lower) : Negative for lower GI symptoms  Constitutional : Negative for symptoms  Skin: Negative for skin symptoms  Eyes: Negative for eye symptoms  Ear/Nose/Throat : Negative for Ear/Nose/Throat symptoms  Hematologic/Lymphatic: Negative for Hematologic/Lymphatic symptoms  Cardiovascular : Negative for cardiovascular symptoms  Respiratory : Negative for respiratory symptoms  Endocrine: Negative for endocrine symptoms  Musculoskeletal: Negative for musculoskeletal symptoms  Neurological: Negative for neurological symptoms  Psychologic: Negative for psychiatric symptoms    Physical Exam: There were no vitals taken for this visit.  Constitutional:  Alert and oriented, No acute distress. Very vigorous for age.  HEENT: [place] AT, moist mucus membranes.  Trachea midline, no masses. Cardiovascular: No clubbing, cyanosis, or edema. Respiratory: Normal respiratory effort, no increased work of breathing. GI: Abdomen is soft, nontender, nondistended, no abdominal masses GU: No CVA  tenderness. Minimal obeity.  Skin: No rashes, bruises or suspicious lesions. Lymph: No cervical or inguinal adenopathy. Neurologic: Grossly intact, no focal deficits, moving all 4 extremities. Psychiatric: Normal mood and affect.  Laboratory Data: Lab Results  Component Value Date   WBC 5.4 08/28/2015   HGB 10.8 (L) 08/28/2015   HCT 32.8 (L) 08/28/2015   MCV 92.7 08/28/2015   PLT 249 08/28/2015    Lab Results  Component Value Date   CREATININE 0.70 07/08/2016    No results found for: PSA  No results found for: TESTOSTERONE  Lab Results  Component Value Date   HGBA1C 6.0 (H) 02/10/2015    Urinalysis    Component Value Date/Time   COLORURINE YELLOW 02/12/2015 0134   APPEARANCEUR Hazy (A) 05/05/2016 1445   LABSPEC 1.017 02/12/2015 0134   LABSPEC 1.017 09/19/2013 2127   PHURINE 6.5 02/12/2015 0134   GLUCOSEU Negative 05/05/2016 1445   GLUCOSEU Negative 09/19/2013 2127   HGBUR NEGATIVE 02/12/2015 0134   BILIRUBINUR Negative 05/05/2016 1445   BILIRUBINUR Negative 09/19/2013 2127   KETONESUR NEGATIVE 02/12/2015 0134   PROTEINUR Trace (A) 05/05/2016 1445   PROTEINUR NEGATIVE 02/12/2015 0134   UROBILINOGEN 0.2 02/12/2015 0134   NITRITE Negative 05/05/2016 1445   NITRITE NEGATIVE 02/12/2015 0134   LEUKOCYTESUR 1+ (A) 05/05/2016 1445   LEUKOCYTESUR Negative 09/19/2013 2127    Pertinent Imaging: CT 07/2016 reviewed independantly  Assessment & Plan:    1 - Recurrent High Grade Bladder Cancer - Likley at least T2 or even T3 disease by imaging refarctry to topicals / cysto treatment. Likely <2 year CSS. Discussed options of systemic chemo (Gem/Cis v. Tecentriq) as may slow down some v. Cystectomy v. Palliative only managent.   She has excellent functional status and I DO feel she is cystectomy candidate, understanding she is of course higher than average risk and may need rehab/ SNF at discharge.   2 - Umbilical Hernia -  This would need repair at time of cystecotmy  should she chose that route.   3 - Refractory Dysuria/ Urinary Urgency -  This is due to her cancer and will likely be progressive.   At this point she wants to think about options and she will let us know her final decision.   Alexis Frock, [facility] [address] [place], [place], [place] [address] [phone]

## 2016-08-02 ENCOUNTER — Telehealth: Payer: Self-pay | Admitting: Urology

## 2016-08-04 ENCOUNTER — Ambulatory Visit: Payer: Medicare Other | Admitting: Urology

## 2016-08-10 ENCOUNTER — Telehealth: Payer: Self-pay | Admitting: Urology

## 2016-08-10 NOTE — Telephone Encounter (Signed)
Pt called office stating Dr. Erlene Quan had called her yesterday and she was returning Dr. Cherrie Gauze call and would like for Dr. Erlene Quan to call her back, she'd like to speak with Dr. Erlene Quan herself. Please advise. Thanks

## 2016-08-17 ENCOUNTER — Telehealth (HOSPITAL_COMMUNITY): Payer: Self-pay

## 2016-08-17 NOTE — Telephone Encounter (Signed)
Called to schedule f/u angio, left message for pt to return call. AW

## 2016-08-25 ENCOUNTER — Telehealth: Payer: Self-pay | Admitting: Urology

## 2016-08-25 NOTE — Telephone Encounter (Signed)
Pt's son called and said pt has tried to call several times.  He wants to know if her cancer is stage 1, per Dr. Erlene Quan or stage 3 per Dr. Tresa Moore.  The son would like for you to give him a call.  Jodi Colon [phone]  He would rather you talk to him vs his mother.  He said you were welcome to leave a message and he would give you a call back.

## 2016-08-25 NOTE — Telephone Encounter (Signed)
Lengthy discussion today with the patient's son. I been playing phone tag for the past month with Mrs. Gurganus.  I described the difference routine clinical and pathological diagnosis. Based on her CT scan, there is thickening of the bladder which may suggest T3 disease although this is not biopsy-proven.  We again discussed cystoprostatectomy on the phone and possible complications. All his questions were answered. He will discuss this further with his mother.   I have recommended making appointment as needed to answer any additional questions given the complexity of her clinical situation.  Hollice Espy, MD

## 2016-09-09 ENCOUNTER — Telehealth (HOSPITAL_COMMUNITY): Payer: Self-pay

## 2016-09-09 NOTE — Telephone Encounter (Signed)
Called to schedule angio, left message for pt to return call. AW 

## 2016-12-07 ENCOUNTER — Telehealth (HOSPITAL_COMMUNITY): Payer: Self-pay

## 2016-12-07 NOTE — Telephone Encounter (Signed)
Called to schedule f/u angio, left message for pt to return call. AW

## 2017-07-05 ENCOUNTER — Other Ambulatory Visit: Payer: Self-pay | Admitting: Internal Medicine

## 2017-07-05 DIAGNOSIS — Z1231 Encounter for screening mammogram for malignant neoplasm of breast: Secondary | ICD-10-CM

## 2017-07-29 HISTORY — PX: CARPAL TUNNEL RELEASE: SHX101

## 2017-08-15 ENCOUNTER — Ambulatory Visit
Admission: RE | Admit: 2017-08-15 | Discharge: 2017-08-15 | Disposition: A | Discharge status: Home / Self Care | Payer: Medicare Other | Source: Ambulatory Visit | Attending: Internal Medicine | Admitting: Internal Medicine

## 2017-08-15 ENCOUNTER — Other Ambulatory Visit: Payer: Self-pay | Admitting: Internal Medicine

## 2017-08-15 DIAGNOSIS — Z1231 Encounter for screening mammogram for malignant neoplasm of breast: Secondary | ICD-10-CM | POA: Diagnosis not present

## 2017-09-07 ENCOUNTER — Other Ambulatory Visit: Payer: Self-pay

## 2017-09-07 ENCOUNTER — Encounter
Admission: RE | Admit: 2017-09-07 | Discharge: 2017-09-07 | Disposition: A | Discharge status: Home / Self Care | Payer: Medicare Other | Source: Ambulatory Visit | Attending: Orthopedic Surgery | Admitting: Orthopedic Surgery

## 2017-09-07 DIAGNOSIS — R9431 Abnormal electrocardiogram [ECG] [EKG]: Secondary | ICD-10-CM | POA: Diagnosis not present

## 2017-09-07 DIAGNOSIS — Z01810 Encounter for preprocedural cardiovascular examination: Secondary | ICD-10-CM | POA: Insufficient documentation

## 2017-09-07 DIAGNOSIS — Z01812 Encounter for preprocedural laboratory examination: Secondary | ICD-10-CM | POA: Insufficient documentation

## 2017-09-07 DIAGNOSIS — E785 Hyperlipidemia, unspecified: Secondary | ICD-10-CM | POA: Diagnosis not present

## 2017-09-07 DIAGNOSIS — I10 Essential (primary) hypertension: Secondary | ICD-10-CM | POA: Insufficient documentation

## 2017-09-07 DIAGNOSIS — I6789 Other cerebrovascular disease: Secondary | ICD-10-CM | POA: Diagnosis not present

## 2017-09-07 HISTORY — DX: Pneumonia, unspecified organism: J18.9

## 2017-09-07 HISTORY — DX: Other complications of anesthesia, initial encounter: T88.59XA

## 2017-09-07 HISTORY — DX: Adverse effect of unspecified anesthetic, initial encounter: T41.45XA

## 2017-09-07 HISTORY — DX: Hypothyroidism, unspecified: E03.9

## 2017-09-07 LAB — BASIC METABOLIC PANEL
Anion gap: 6 (ref 5–15)
BUN: 11 mg/dL (ref 6–20)
CO2: 28 mmol/L (ref 22–32)
Calcium: 9.2 mg/dL (ref 8.9–10.3)
Chloride: 104 mmol/L (ref 101–111)
Creatinine, Ser: 0.69 mg/dL (ref 0.44–1.00)
GFR calc Af Amer: >60 mL/min (ref >60)
GFR calc non Af Amer: >60 mL/min (ref >60)
Glucose, Bld: 84 mg/dL (ref 65–99)
Potassium: 4.2 mmol/L (ref 3.5–5.1)
Sodium: 138 mmol/L (ref 135–145)

## 2017-09-07 LAB — URINALYSIS, ROUTINE W REFLEX MICROSCOPIC
Bilirubin Urine: NEGATIVE
Glucose, UA: NEGATIVE mg/dL
Hgb urine dipstick: NEGATIVE
Ketones, ur: NEGATIVE mg/dL
Nitrite: NEGATIVE
Protein, ur: NEGATIVE mg/dL
Specific Gravity, Urine: 1.009 (ref 1.005–1.030)
pH: 6 (ref 5.0–8.0)

## 2017-09-07 LAB — PROTIME-INR
INR: 0.97
Prothrombin Time: 12.8 seconds (ref 11.4–15.2)

## 2017-09-07 LAB — APTT: aPTT: 32 seconds (ref 24–36)

## 2017-09-07 LAB — CBC
HCT: 40 % (ref 35.0–47.0)
Hemoglobin: 13.4 g/dL (ref 12.0–16.0)
MCH: 31 pg (ref 26.0–34.0)
MCHC: 33.5 g/dL (ref 32.0–36.0)
MCV: 92.7 fL (ref 80.0–100.0)
Platelets: 200 10*3/uL (ref 150–440)
RBC: 4.31 MIL/uL (ref 3.80–5.20)
RDW: 14.3 % (ref 11.5–14.5)
WBC: 6.8 10*3/uL (ref 3.6–11.0)

## 2017-09-07 LAB — SEDIMENTATION RATE: Sed Rate: 15 mm/hr (ref 0–30)

## 2017-09-07 LAB — SURGICAL PCR SCREEN
MRSA, PCR: NEGATIVE
Staphylococcus aureus: NEGATIVE

## 2017-09-07 LAB — TYPE AND SCREEN
ABO/RH(D): A NEG
Antibody Screen: NEGATIVE

## 2017-09-07 NOTE — Patient Instructions (Signed)
  Your procedure is scheduled on: Tuesday September 20, 2017 Report to Same Day Surgery 2nd floor medical mall ([place] Entrance-take elevator on left to 2nd floor.  Check in with surgery information desk.) To find out your arrival time please call [phone] between 1PM - 3PM on Monday September 19, 2017   Remember: Instructions that are not followed completely may result in serious medical risk, up to and including death, or upon the discretion of your surgeon and anesthesiologist your surgery may need to be rescheduled.    _x___ 1. Do not eat food after midnight the night before your procedure. You may drink clear liquids up to 2 hours before you are scheduled to arrive at the hospital for your procedure.  Do not drink clear liquids within 2 hours of your scheduled arrival to the hospital.  Clear liquids include  --Water or Apple juice without pulp  --Clear carbohydrate beverage such as Gatorade  --Black Coffee or Clear Tea (No milk, no creamers, do not add anything to the coffee or tea   No gum chewing or hard candies.      __x__ 2. No Alcohol for 24 hours before or after surgery.   __x__3. No Smoking or e-cigarettes for 24 prior to surgery.  Do not use any chewable tobacco products for at least 6 hour prior to surgery   ____  4. Bring all medications with you on the day of surgery if instructed.    __x__ 5. Notify your doctor if there is any change in your medical condition     (cold, fever, infections).    x___6. On the morning of surgery brush your teeth with toothpaste and water.  You may rinse your mouth with mouth wash if you wish.  Do not swallow any toothpaste or mouthwash.   Do not wear jewelry, make-up, hairpins, clips or nail polish.  Do not wear lotions, powders, or perfumes. You may wear deodorant.  Do not shave 48 hours prior to surgery.   Do not bring valuables to the hospital.    [facility] is not responsible for any belongings or valuables.    Contacts, dentures or bridgework may not be worn into surgery.  Leave your suitcase in the car. After surgery it may be brought to your room.  For patients admitted to the hospital, discharge time is determined by your treatment team.     Please read over the following fact sheets that you were given:   [facility] Preparing for Surgery and or MRSA Information   _x___ Take anti-hypertensive listed below, cardiac, seizure, asthma,     anti-reflux and psychiatric medicines. These include:  1. Atorvastatin/Lipitor  2. Gabapentin/Neurontin  3. Levothyroxine/Synthroid  4. Pantoprazole/Protonix  5. Cetirizine/Zyrtec if needed  _x___ Use CHG Soap or sage wipes as directed on instruction sheet   _x___ Follow recommendations from Cardiologist, Pulmonologist or PCP regarding stopping Aspirin, Coumadin, Plavix ,Eliquis, Effient, or Pradaxa, and Pletal.  X____Stop Anti-inflammatories such as Advil, Aleve, Ibuprofen, Motrin, Naproxen, Naprosyn, Goodies powders or aspirin products. OK to take Tylenol and Celebrex. Stop Aspirin and Meloxicam/Mobic  7 days prior to surgery, Tuesday September 13, 2017.   _x___ Stop supplements until after surgery.  But may continue Vitamin D, Vitamin B, and multivitamin.  Call Dr. Theodore Demark office to verify if you need to take the Joint Class next Wednesday September 14, 2017

## 2017-09-08 NOTE — Pre-Procedure Instructions (Addendum)
AS INSTRUCTED BY DR Donata Duff, EKG AND REQUEST FOR MEDICAL CLEARANCE CALLED AND FAXED TO DR Ginette Pitman AND DR [facility]. SPOKE WITH ASHLEY AT DR [facility] OFFICE

## 2017-09-09 LAB — URINE CULTURE: Culture: 20000 — AB

## 2017-09-10 ENCOUNTER — Other Ambulatory Visit: Payer: Self-pay

## 2017-09-10 ENCOUNTER — Emergency Department
Admission: EM | Admit: 2017-09-10 | Discharge: 2017-09-10 | Disposition: A | Discharge status: Home / Self Care | Payer: Medicare Other | Attending: Emergency Medicine | Admitting: Emergency Medicine

## 2017-09-10 ENCOUNTER — Emergency Department: Payer: Medicare Other

## 2017-09-10 ENCOUNTER — Encounter: Payer: Self-pay | Admitting: Emergency Medicine

## 2017-09-10 DIAGNOSIS — Z853 Personal history of malignant neoplasm of breast: Secondary | ICD-10-CM | POA: Insufficient documentation

## 2017-09-10 DIAGNOSIS — N189 Chronic kidney disease, unspecified: Secondary | ICD-10-CM | POA: Insufficient documentation

## 2017-09-10 DIAGNOSIS — Z79899 Other long term (current) drug therapy: Secondary | ICD-10-CM | POA: Insufficient documentation

## 2017-09-10 DIAGNOSIS — F329 Major depressive disorder, single episode, unspecified: Secondary | ICD-10-CM | POA: Insufficient documentation

## 2017-09-10 DIAGNOSIS — Z8551 Personal history of malignant neoplasm of bladder: Secondary | ICD-10-CM | POA: Diagnosis not present

## 2017-09-10 DIAGNOSIS — I129 Hypertensive chronic kidney disease with stage 1 through stage 4 chronic kidney disease, or unspecified chronic kidney disease: Secondary | ICD-10-CM | POA: Insufficient documentation

## 2017-09-10 DIAGNOSIS — Z9049 Acquired absence of other specified parts of digestive tract: Secondary | ICD-10-CM | POA: Diagnosis not present

## 2017-09-10 DIAGNOSIS — E039 Hypothyroidism, unspecified: Secondary | ICD-10-CM | POA: Insufficient documentation

## 2017-09-10 DIAGNOSIS — H44001 Unspecified purulent endophthalmitis, right eye: Secondary | ICD-10-CM | POA: Insufficient documentation

## 2017-09-10 DIAGNOSIS — Z8673 Personal history of transient ischemic attack (TIA), and cerebral infarction without residual deficits: Secondary | ICD-10-CM | POA: Insufficient documentation

## 2017-09-10 DIAGNOSIS — Z7982 Long term (current) use of aspirin: Secondary | ICD-10-CM | POA: Diagnosis not present

## 2017-09-10 DIAGNOSIS — H53131 Sudden visual loss, right eye: Secondary | ICD-10-CM | POA: Diagnosis present

## 2017-09-10 LAB — DIFFERENTIAL
Basophils Absolute: 0 10*3/uL (ref 0–0.1)
Basophils Relative: 1 %
Eosinophils Absolute: 0.4 10*3/uL (ref 0–0.7)
Eosinophils Relative: 7 %
Lymphocytes Relative: 25 %
Lymphs Abs: 1.5 10*3/uL (ref 1.0–3.6)
Monocytes Absolute: 0.6 10*3/uL (ref 0.2–0.9)
Monocytes Relative: 10 %
Neutro Abs: 3.4 10*3/uL (ref 1.4–6.5)
Neutrophils Relative %: 57 %

## 2017-09-10 LAB — COMPREHENSIVE METABOLIC PANEL
ALT: 22 U/L (ref 14–54)
AST: 31 U/L (ref 15–41)
Albumin: 4 g/dL (ref 3.5–5.0)
Alkaline Phosphatase: 104 U/L (ref 38–126)
Anion gap: 4 — ABNORMAL LOW (ref 5–15)
BUN: 12 mg/dL (ref 6–20)
CO2: 29 mmol/L (ref 22–32)
Calcium: 8.7 mg/dL — ABNORMAL LOW (ref 8.9–10.3)
Chloride: 99 mmol/L — ABNORMAL LOW (ref 101–111)
Creatinine, Ser: 0.64 mg/dL (ref 0.44–1.00)
GFR calc Af Amer: >60 mL/min (ref >60)
GFR calc non Af Amer: >60 mL/min (ref >60)
Glucose, Bld: 106 mg/dL — ABNORMAL HIGH (ref 65–99)
Potassium: 3.8 mmol/L (ref 3.5–5.1)
Sodium: 132 mmol/L — ABNORMAL LOW (ref 135–145)
Total Bilirubin: 0.5 mg/dL (ref 0.3–1.2)
Total Protein: 6.4 g/dL — ABNORMAL LOW (ref 6.5–8.1)

## 2017-09-10 LAB — APTT: aPTT: 30 seconds (ref 24–36)

## 2017-09-10 LAB — PROTIME-INR
INR: 0.95
Prothrombin Time: 12.6 seconds (ref 11.4–15.2)

## 2017-09-10 LAB — CBC
HCT: 37.5 % (ref 35.0–47.0)
Hemoglobin: 12.8 g/dL (ref 12.0–16.0)
MCH: 31.3 pg (ref 26.0–34.0)
MCHC: 34.2 g/dL (ref 32.0–36.0)
MCV: 91.6 fL (ref 80.0–100.0)
Platelets: 183 10*3/uL (ref 150–440)
RBC: 4.1 MIL/uL (ref 3.80–5.20)
RDW: 13.9 % (ref 11.5–14.5)
WBC: 5.9 10*3/uL (ref 3.6–11.0)

## 2017-09-10 LAB — SEDIMENTATION RATE: Sed Rate: 20 mm/hr (ref 0–30)

## 2017-09-10 LAB — TROPONIN I: Troponin I: <0.03 ng/mL (ref <0.03)

## 2017-09-10 MED ORDER — LISINOPRIL 10 MG PO TABS
10.0000 mg | ORAL_TABLET | Freq: Once | ORAL | Status: AC
  Administered 2017-09-10: 10 mg via ORAL
  Filled 2017-09-10: qty 1

## 2017-09-10 MED ORDER — MOXIFLOXACIN HCL 0.5 % OP SOLN
1.0000 [drp] | OPHTHALMIC | Status: DC
  Administered 2017-09-10: 1 [drp] via OPHTHALMIC
  Filled 2017-09-10: qty 3

## 2017-09-10 MED ORDER — AMLODIPINE BESYLATE 5 MG PO TABS
5.0000 mg | ORAL_TABLET | Freq: Once | ORAL | Status: AC
  Administered 2017-09-10: 5 mg via ORAL
  Filled 2017-09-10: qty 1

## 2017-09-10 NOTE — ED Notes (Signed)
Family requests to see EDP related to blood pressure, MD aware

## 2017-09-10 NOTE — ED Triage Notes (Signed)
ARrives with c/o sudden vision loss to right eye. Onset of symptom onset today at approximately 1630.  Patient states she is now able to see colors from right eye.

## 2017-09-10 NOTE — Consult Note (Signed)
Reason for Consult: loss of vision OD  Referring Physician: Corky Downs -ER  Jodi Colon is an 84 y.o. female.  Chief complaint: loss of vision today at 4:00 PM <principal problem not specified> HPI:  84 yo WF ho IOL OU and Glaucoma s/p Ahmed valve OU (17, 18) co painless loss of vision around 4 pm today.   Was watching tv and noticed vision loss.  No pain HA fevers weakness dizziness. No other associated symptoms.  Has been using timolol gtts qAM without probs. Presented to ER this PM. Last seen at [facility] 06/08/17: Va 20/30 OD cc and 20/25 OS IOP was 14, 18 on 06/08/17   POH: Glaucoma./ Oc HTN CE/ IOL OU Ahmed glaucoma valves OU (0301,31)   Past Medical History:  Diagnosis Date  . Adnexal cyst   . Arthritis   . Bladder cancer ([place]) 04/2015  . Brain aneurysm    small, followed with regular angiograms, Dr. Estanislado Pandy  . Breast cancer [facility]) 2007   DCIS-Right, s/p lumpectomy/xrt  . Cerebrovascular disease 2008   s/p PTCA/stent of MCA, Deveshwar  . Chronic kidney disease   . Complication of anesthesia    2003- trouble waking up after fracture surgery, no issues with anesthesia since  . DDD (degenerative disc disease), cervical   . Degenerative joint disease    managed by the [facility]  . GERD (gastroesophageal reflux disease)   . Headache   . History of hiatal hernia   . History of MRSA infection   . History of TIA (transient ischemic attack)   . Hx MRSA infection   . Hyperlipidemia   . Hypertension   . Hypothyroidism   . Incomplete bladder emptying   . 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 ([place])   . MRSA (methicillin resistant Staphylococcus aureus) 2008   perineal abscess  . Nocturia   . Pneumonia   . Radiation 2007   BREAST CA  . Spinal stenosis   . Stroke ([place])   . Thyroid disease   . Urge incontinence     ROS  Past Surgical History:  Procedure Laterality Date  . ABDOMINAL HYSTERECTOMY    . AQUEOUS SHUNT Left 01/05/2016   Procedure:  AQUEOUS SHUNT;  Surgeon: Ronnell Freshwater, MD;  Location: [place];  Service: Ophthalmology;  Laterality: Left;  AHMED VALVE WITH SCLERAL PATCH GRAFT  . AQUEOUS SHUNT Right 07/19/2016   Procedure: AQUEOUS SHUNT;  Surgeon: Ronnell Freshwater, MD;  Location: [place];  Service: Ophthalmology;  Laterality: Right;  Ahmed tube shunt  . BACK SURGERY    . BLADDER SURGERY  2012, 2014  . BRAIN SURGERY Right 2008   stent, [facility], [place]  . BREAST LUMPECTOMY Right 2007   DCIS, Lumpectomy F/U radiation   . CARPAL TUNNEL RELEASE Left 07/2017  . CATARACT EXTRACTION    . CHOLECYSTECTOMY    . COLONOSCOPY  2013  . CYSTOSCOPY W/ RETROGRADES Bilateral 05/20/2015   Procedure: CYSTOSCOPY WITH RETROGRADE PYELOGRAM;  Surgeon: Hollice Espy, MD;  Location: [facility];  Service: Urology;  Laterality: Bilateral;  . CYSTOSCOPY W/ RETROGRADES Bilateral 12/24/2015   Procedure: CYSTOSCOPY WITH RETROGRADE PYELOGRAM;  Surgeon: Hollice Espy, MD;  Location: [facility];  Service: Urology;  Laterality: Bilateral;  . CYSTOSCOPY WITH BIOPSY N/A 05/20/2015   Procedure: CYSTOSCOPY WITH BIOPSY;  Surgeon: Hollice Espy, MD;  Location: [facility];  Service: Urology;  Laterality: N/A;  . CYSTOSCOPY WITH BIOPSY N/A 12/24/2015   Procedure: CYSTOSCOPY WITH BIOPSY;  Surgeon:  Hollice Espy, MD;  Location: [facility];  Service: Urology;  Laterality: N/A;  . EYE SURGERY Bilateral    Cataract Extraction, glaucoma surgery 2018, 2019  . FRACTURE SURGERY Left    Ankle, compound fracture  . HEMORRHOID SURGERY  02/01/13  . LAPAROSCOPIC HYSTERECTOMY    . TONSILLECTOMY      Family History  Problem Relation Age of Onset  . Fibromyalgia Daughter   . Breast cancer Neg Hx     Social History:  reports that she has never smoked. She has never used smokeless tobacco. She reports that she does not drink alcohol or use drugs.  Allergies:  Allergies  Allergen Reactions  . Penicillins Rash    Has patient had  a PCN reaction causing immediate rash, facial/tongue/throat swelling, SOB or lightheadedness with hypotension: Yes Has patient had a PCN reaction causing severe rash involving mucus membranes or skin necrosis: No Has patient had a PCN reaction that required hospitalization: No Has patient had a PCN reaction occurring within the last 10 years: No If all of the above answers are "NO", then may proceed with Cephalosporin use.   . Sulfasalazine Swelling    "swelling of face"  . Sulfa Antibiotics Rash    Medications: Prior to Admission:  (Not in a hospital admission)  Results for orders placed or performed during the hospital encounter of 09/10/17 (from the past 48 hour(s))  Sedimentation rate     Status: None   Collection Time: 09/10/17  6:02 PM  Result Value Ref Range   Sed Rate 20 0 - 30 mm/hr    Comment: Performed at [facility], [address] [place], [place] [address]  Protime-INR     Status: None   Collection Time: 09/10/17  6:02 PM  Result Value Ref Range   Prothrombin Time 12.6 11.4 - 15.2 seconds   INR 0.95     Comment: Performed at [facility], [address] [place], [place] [address]  APTT     Status: None   Collection Time: 09/10/17  6:02 PM  Result Value Ref Range   aPTT 30 24 - 36 seconds    Comment: Performed at [facility], [address] [place], [place] [address]  CBC     Status: None   Collection Time: 09/10/17  6:02 PM  Result Value Ref Range   WBC 5.9 3.6 - 11.0 K/uL   RBC 4.10 3.80 - 5.20 MIL/uL   Hemoglobin 12.8 12.0 - 16.0 g/dL   HCT 37.5 35.0 - 47.0 %   MCV 91.6 80.0 - 100.0 fL   MCH 31.3 26.0 - 34.0 pg   MCHC 34.2 32.0 - 36.0 g/dL   RDW 13.9 11.5 - 14.5 %   Platelets 183 150 - 440 K/uL    Comment: Performed at [facility], [address] [place], [place] [address]  Differential     Status: None   Collection Time: 09/10/17  6:02 PM  Result Value Ref Range   Neutrophils Relative % 57 %   Neutro Abs  3.4 1.4 - 6.5 K/uL   Lymphocytes Relative 25 %   Lymphs Abs 1.5 1.0 - 3.6 K/uL   Monocytes Relative 10 %   Monocytes Absolute 0.6 0.2 - 0.9 K/uL   Eosinophils Relative 7 %   Eosinophils Absolute 0.4 0 - 0.7 K/uL   Basophils Relative 1 %   Basophils Absolute 0.0 0 - 0.1 K/uL    Comment: Performed at [facility], [address] [place], [place] [address]  Comprehensive metabolic panel     Status: Abnormal   Collection Time: 09/10/17  6:02 PM  Result Value Ref Range   Sodium 132 (L) 135 - 145 mmol/L   Potassium 3.8 3.5 - 5.1 mmol/L   Chloride 99 (L) 101 - 111 mmol/L   CO2 29 22 - 32 mmol/L   Glucose, Bld 106 (H) 65 - 99 mg/dL   BUN 12 6 - 20 mg/dL   Creatinine, Ser 0.64 0.44 - 1.00 mg/dL   Calcium 8.7 (L) 8.9 - 10.3 mg/dL   Total Protein 6.4 (L) 6.5 - 8.1 g/dL   Albumin 4.0 3.5 - 5.0 g/dL   AST 31 15 - 41 U/L   ALT 22 14 - 54 U/L   Alkaline Phosphatase 104 38 - 126 U/L   Total Bilirubin 0.5 0.3 - 1.2 mg/dL   GFR calc non Af Amer >60 >60 mL/min   GFR calc Af Amer >60 >60 mL/min    Comment: (NOTE) The eGFR has been calculated using the CKD EPI equation. This calculation has not been validated in all clinical situations. eGFR's persistently <60 mL/min signify possible Chronic Kidney Disease.    Anion gap 4 (L) 5 - 15    Comment: Performed at [facility], [address] [place], [place] [address]  Troponin I     Status: None   Collection Time: 09/10/17  6:02 PM  Result Value Ref Range   Troponin I <0.03 <0.03 ng/mL    Comment: Performed at [facility], [address] [place],  [address]    Ct Head Wo Contrast  Result Date: 09/10/2017 CLINICAL DATA:  Visual loss RIGHT eye beginning at 1730 hours, improving, history of TIA, bladder cancer, breast cancer, brain aneurysm, hypertension, stroke EXAM: CT HEAD WITHOUT CONTRAST TECHNIQUE: Contiguous axial images were obtained from the base of the skull through the vertex without intravenous  contrast. Sagittal and coronal MPR images reconstructed from axial data set. COMPARISON:  02/10/2015 FINDINGS: Brain: Generalized atrophy. Normal ventricular morphology. No midline shift or mass effect. Small vessel chronic ischemic changes of deep cerebral white matter. Old lacunar infarct LEFT basal ganglia. No intracranial hemorrhage, mass lesion, evidence of acute infarction, or extra-axial fluid collection. Vascular: Atherosclerotic calcifications of internal carotid arteries at skull base. Stent identified at RIGHT MCA. No hyperdense vessels. Skull: Intact Sinuses/Orbits: Clear Other: N/A IMPRESSION: Atrophy with small vessel chronic ischemic changes of deep cerebral white matter. Small old lacunar infarct LEFT basal ganglia. Prior RIGHT MCA stenting. No acute intracranial abnormalities. Electronically Signed   By: [place] Dana M.D.   On: 09/10/2017 18:47    Blood pressure (!) 182/69, pulse 70, temperature 98.2 F (36.8 C), temperature source Oral, resp. rate 16, height 4' 9"  (1.448 m), weight 58.5 kg (129 lb), SpO2 96 %.  Mental status: Alert and Oriented x 4  Visual Acuity:  20/60-1 OD  20/30 near [place]  Pupils:  Sluggish OD Equally round/ reactive to light.  No Afferent defect.  Motility:  Full/ orthophoric  Visual Fields:  Concentric suppression OD Full to confrontation OS  IOP:  Ta: 12 OD, 18 OS  External/ Lids/ Lashes:  Normal  Anterior Segment:  Conjunctiva:  Quiet/ Normal  OU No injection.  Active blebs over tubes OU with negative Seidel  Cornea:  Clear/ Normal  OU  Anterior Chamber: Tubes without touch OU     Deep 3-4+ cell OD without hypoyon     Normal  OS  Lens:   Normal pseudophakic  OU  Posterior Segment: Dilated OU with 1% Tropicamide and 2.5% Phenylephrine  Hazy view OD (due to [facility] cells , but no ant vit cells seen and no vitreous debris)  Discs:   Cd 0.6-0.7 OU  Macula:  Normal  Vessels/ Periphery: Normal no ret heme    Assessment/Plan: Acute vision loss OD due  to either 1) Endophthalmitis OD or 2) Uveitis OD Given presence of tube shunts, will have high index of suspicion for endophthalmitis.  Procedure: Topical anesthesia with tetracaine Paracentesis performed for aqueous humor culture - 0.1 ml sent to lab No complications of procedure. Seidel negative post-procedure  Rx sent to pharmacy for Durezol Q1 hr while awake and Gatifloxacin q2 hr WA. Fu tomorrow 9 AM at [facility]. If any worsening will plan tap/inject and retina consultation.  Horacio Werth 09/10/2017, 7:06 PM

## 2017-09-10 NOTE — Discharge Instructions (Addendum)
Please see Dr. Wallace Going as he discussed with you

## 2017-09-10 NOTE — ED Provider Notes (Signed)
[facility] Emergency Department Provider Note   ____________________________________________    I have reviewed the triage vital signs and the nursing notes.   HISTORY  Chief Complaint Loss of Vision     HPI Jodi Colon is a 84 y.o. female who presents with complaints of vision loss in the right eye.  Patient reports at approximately 4:30 PM she was watching TV in a well lit room and had abrupt painless vision loss in her right eye.  Denies headache, no temporal pain.  Does report a history of glaucoma treated by ophthalmology but she never had symptoms from it.  Denies headache or other neuro deficits.  No nausea or vomiting.   Past Medical History:  Diagnosis Date  . Adnexal cyst   . Arthritis   . Bladder cancer ([place]) 04/2015  . Brain aneurysm    small, followed with regular angiograms, Dr. Estanislado Pandy  . Breast cancer [facility]) 2007   DCIS-Right, s/p lumpectomy/xrt  . Cerebrovascular disease 2008   s/p PTCA/stent of MCA, Deveshwar  . Chronic kidney disease   . Complication of anesthesia    2003- trouble waking up after fracture surgery, no issues with anesthesia since  . DDD (degenerative disc disease), cervical   . Degenerative joint disease    managed by the [facility]  . GERD (gastroesophageal reflux disease)   . Headache   . History of hiatal hernia   . History of MRSA infection   . History of TIA (transient ischemic attack)   . Hx MRSA infection   . Hyperlipidemia   . Hypertension   . Hypothyroidism   . Incomplete bladder emptying   . 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 ([place])   . MRSA (methicillin resistant Staphylococcus aureus) 2008   perineal abscess  . Nocturia   . Pneumonia   . Radiation 2007   BREAST CA  . Spinal stenosis   . Stroke ([place])   . Thyroid disease   . Urge incontinence     Patient Active Problem List   Diagnosis Date Noted  . Umbilical hernia without obstruction and without gangrene  07/28/2016  . Restless leg 10/21/2015  . Urinary frequency 10/15/2015  . Dysuria 10/15/2015  . Bursitis, trochanteric 04/02/2015  . Trochanteric bursitis of right hip 04/02/2015  . Disease of thyroid gland 03/04/2015  . Acid reflux 03/04/2015  . Subarachnoid hemorrhage ([place]) 02/09/2015  . Degenerative arthritis of lumbar spine 01/29/2015  . DDD (degenerative disc disease), lumbar 01/29/2015  . Arthritis, degenerative 01/28/2014  . Infection with methicillin-resistant Staphylococcus aureus 01/28/2014  . BP (high blood pressure) 01/28/2014  . HLD (hyperlipidemia) 01/28/2014  . Carcinoma in situ, breast, ductal 01/28/2014  . Clinical depression 01/28/2014  . Aneurysm, cerebral 01/28/2014  . Intraductal carcinoma of breast 01/28/2014  . Lumbar canal stenosis 12/07/2013  . Neuritis or radiculitis due to rupture of lumbar intervertebral disc 12/07/2013  . Cramp in lower leg 06/19/2013  . Hemorrhoids that prolapse with straining, but retract spontaneously 01/24/2013  . Scalp lesion 11/27/2012  . Rectal prolapse 11/27/2012  . Hemorrhoid 08/29/2012  . Screening breast examination 07/12/2012  . FOM (frequency of micturition) 02/28/2012  . Urge incontinence 02/28/2012  . History of neoplasm of bladder 02/28/2012  . Excessive urination at night 02/28/2012  . Bladder CA in situ 02/28/2012  . History of methicillin resistant Staphylococcus aureus infection 12/30/2011  . H/O infectious disease 12/30/2011  . Cutaneous ulcer ([place]) 11/26/2011  . Skin ulcer ([place]) 11/26/2011  .  Colon polyp 11/11/2011  . Absolute anemia 11/11/2011  . Back pain, chronic 11/01/2011  . Abscess, gluteal, right 10/10/2011  . Abscess of buttock 10/10/2011  . Degenerative joint disease   . Cerebrovascular disease   . MRSA (methicillin resistant Staphylococcus aureus)   . Hyperlipidemia LDL goal < 100 04/02/2011  . Bladder cancer ([place]) 10/29/2009  . Malignant neoplasm of urinary bladder ([place]) 10/29/2009    Past  Surgical History:  Procedure Laterality Date  . ABDOMINAL HYSTERECTOMY    . AQUEOUS SHUNT Left 01/05/2016   Procedure: AQUEOUS SHUNT;  Surgeon: Ronnell Freshwater, MD;  Location: [place];  Service: Ophthalmology;  Laterality: Left;  AHMED VALVE WITH SCLERAL PATCH GRAFT  . AQUEOUS SHUNT Right 07/19/2016   Procedure: AQUEOUS SHUNT;  Surgeon: Ronnell Freshwater, MD;  Location: [place];  Service: Ophthalmology;  Laterality: Right;  Ahmed tube shunt  . BACK SURGERY    . BLADDER SURGERY  2012, 2014  . BRAIN SURGERY Right 2008   stent, [facility], [place]  . BREAST LUMPECTOMY Right 2007   DCIS, Lumpectomy F/U radiation   . CARPAL TUNNEL RELEASE Left 07/2017  . CATARACT EXTRACTION    . CHOLECYSTECTOMY    . COLONOSCOPY  2013  . CYSTOSCOPY W/ RETROGRADES Bilateral 05/20/2015   Procedure: CYSTOSCOPY WITH RETROGRADE PYELOGRAM;  Surgeon: Hollice Espy, MD;  Location: [facility];  Service: Urology;  Laterality: Bilateral;  . CYSTOSCOPY W/ RETROGRADES Bilateral 12/24/2015   Procedure: CYSTOSCOPY WITH RETROGRADE PYELOGRAM;  Surgeon: Hollice Espy, MD;  Location: [facility];  Service: Urology;  Laterality: Bilateral;  . CYSTOSCOPY WITH BIOPSY N/A 05/20/2015   Procedure: CYSTOSCOPY WITH BIOPSY;  Surgeon: Hollice Espy, MD;  Location: [facility];  Service: Urology;  Laterality: N/A;  . CYSTOSCOPY WITH BIOPSY N/A 12/24/2015   Procedure: CYSTOSCOPY WITH BIOPSY;  Surgeon: Hollice Espy, MD;  Location: [facility];  Service: Urology;  Laterality: N/A;  . EYE SURGERY Bilateral    Cataract Extraction, glaucoma surgery 2018, 2019  . FRACTURE SURGERY Left    Ankle, compound fracture  . HEMORRHOID SURGERY  02/01/13  . LAPAROSCOPIC HYSTERECTOMY    . TONSILLECTOMY      Prior to Admission medications   Medication Sig Start Date End Date Taking? Authorizing Provider  aspirin 81 MG tablet Take 81 mg by mouth daily.    [provider]  atorvastatin (LIPITOR) 20 MG tablet Take  20 mg by mouth every morning.    [provider]  cetirizine (ZYRTEC) 10 MG tablet Take 10 mg by mouth daily as needed for allergies.    [provider]  fluticasone (FLONASE) 50 MCG/ACT nasal spray Place 1 spray into both nostrils daily as needed for allergies or rhinitis.    [provider]  gabapentin (NEURONTIN) 300 MG capsule Take 300 mg by mouth 2 (two) times daily.    [provider]  HYDROcodone-acetaminophen (NORCO/VICODIN) 5-325 MG tablet Take 1 tablet by mouth at bedtime 08/18/16   [provider]  hydrocortisone 2.5 % cream Apply 1 application topically daily as needed (rash).    [provider]  levothyroxine (SYNTHROID, LEVOTHROID) 50 MCG tablet Take 50 mcg by mouth daily.  11/23/13   [provider]  meloxicam (MOBIC) 7.5 MG tablet Take 7.5 mg by mouth daily.    [provider]  Multiple Vitamin (MULTIVITAMIN) tablet Take 1 tablet by mouth daily.      [provider]  nortriptyline (PAMELOR) 10 MG capsule Take 10 mg by mouth at  bedtime.    [provider]  oxybutynin (DITROPAN) 5 MG tablet Take 5 mg by mouth 2 (two) times daily.    [provider]  pantoprazole (PROTONIX) 40 MG tablet Take 40 mg by mouth 2 (two) times daily.  06/19/13   [provider]  rOPINIRole (REQUIP) 0.5 MG tablet Take 0.5 mg by mouth at bedtime.  06/10/16   [provider]  timolol (TIMOPTIC) 0.5 % ophthalmic solution Place 1 drop into both eyes daily.  10/22/15   [provider]  tiZANidine (ZANAFLEX) 2 MG tablet Take 2 mg by mouth 2 (two) times daily.  04/02/15   [provider]  venlafaxine XR (EFFEXOR-XR) 75 MG 24 hr capsule Take 75 mg by mouth daily.    [provider]     Allergies Penicillins; Sulfasalazine; and Sulfa antibiotics  Family History  Problem Relation Age of Onset  . Fibromyalgia Daughter   . Breast cancer Neg Hx     Social History Social History    Tobacco Use  . Smoking status: Never Smoker  . Smokeless tobacco: Never Used  Substance Use Topics  . Alcohol use: No  . Drug use: No    Review of Systems  Constitutional: No fever/chills Eyes: As above ENT: No neck pain Cardiovascular: Denies chest pain. Respiratory: Denies shortness of breath. Gastrointestinal: No nausea, no vomiting.   Genitourinary: Negative for dysuria. Musculoskeletal: Negative for back pain. Skin: Negative for rash. Neurological: Negative for headaches   ____________________________________________   PHYSICAL EXAM:  VITAL SIGNS: ED Triage Vitals  Enc Vitals Group     BP 09/10/17 1749 (!) 182/69     Pulse Rate 09/10/17 1749 70     Resp 09/10/17 1749 16     Temp 09/10/17 1749 98.2 F (36.8 C)     Temp Source 09/10/17 1749 Oral     SpO2 09/10/17 1749 96 %     Weight 09/10/17 1748 58.5 kg (129 lb)     Height 09/10/17 1748 1.448 m (4\' 9" )     Head Circumference --      Peak Flow --      Pain Score 09/10/17 1748 0     Pain Loc --      Pain Edu? --      Excl. in [place]? --     Constitutional: Alert and oriented. No acute distress. Pleasant and interactive Eyes: Conjunctivae are normal.  Pupillary response decreased in the right eye however normal when light shown in the left eye, no hyphema, no cloudy pupil, normal iris Head: Atraumatic. Nose: No congestion/rhinnorhea. Mouth/Throat: Mucous membranes are moist.   Neck:  Painless ROM Cardiovascular: Normal rate, regular rhythm.  Good peripheral circulation. Respiratory: Normal respiratory effort.  No retractions.  Gastrointestinal: Soft and nontender. No distention.  No CVA tenderness.  Musculoskeletal:  Warm and well perfused Neurologic:  Normal speech and language. No gross focal neurologic deficits are appreciated.  Skin:  Skin is warm, dry and intact. No rash noted. Psychiatric: Mood and affect are normal. Speech and behavior are normal.  ____________________________________________     LABS (all labs ordered are listed, but only abnormal results are displayed)  Labs Reviewed  COMPREHENSIVE METABOLIC PANEL - Abnormal; Notable for the following components:      Result Value   Sodium 132 (*)    Chloride 99 (*)    Glucose, Bld 106 (*)    Calcium 8.7 (*)    Total Protein 6.4 (*)    Anion gap 4 (*)  All other components within normal limits  BODY FLUID CULTURE  SEDIMENTATION RATE  PROTIME-INR  APTT  CBC  DIFFERENTIAL  TROPONIN I  URINALYSIS, ROUTINE W REFLEX MICROSCOPIC   ____________________________________________  EKG  ED ECG REPORT I, [place] Drafts, the attending physician, personally viewed and interpreted this ECG.  Date: 09/10/2017  Rhythm: normal sinus rhythm QRS Axis: normal Intervals: normal ST/T Wave abnormalities: normal Narrative Interpretation: no evidence of acute ischemia  ____________________________________________  RADIOLOGY  CT head no acute distress, no evidence of CVA ____________________________________________   PROCEDURES  Procedure(s) performed: No  Procedures   Critical Care performed:yes  CRITICAL CARE Performed by: [place] Drafts   Total critical care time: 30 minutes  Critical care time was exclusive of separately billable procedures and treating other patients.  Critical care was necessary to treat or prevent imminent or life-threatening deterioration.  Critical care was time spent personally by me on the following activities: development of treatment plan with patient and/or surrogate as well as nursing, discussions with consultants, evaluation of patient's response to treatment, examination of patient, obtaining history from patient or surrogate, ordering and performing treatments and interventions, ordering and review of laboratory studies, ordering and review of radiographic studies, pulse oximetry and re-evaluation of patient's condition.  ____________________________________________   INITIAL  IMPRESSION / ASSESSMENT AND PLAN / ED COURSE  Pertinent labs & imaging results that were available during my care of the patient were reviewed by me and considered in my medical decision making (see chart for details).  ----------------------------------------- 6:06 PM on 09/10/2017 -----------------------------------------  Discussed with Dr. Wallace Going of ophthalmology, he is in route to see the patient.  In the meantime will obtain CT head  ----------------------------------------- 7:12 PM on 09/10/2017 -----------------------------------------  Dr. Wallace Going feels this is likely endophthalmitis, has treated the patient in the emergency department and will see the patient again at 9 AM tomorrow, has taken a culture, if any concern for bacterial infection he will send to [facility]    ____________________________________________   FINAL CLINICAL IMPRESSION(S) / ED DIAGNOSES  Final diagnoses:  Right endophthalmia        Note:  This document was prepared using Dragon voice recognition software and may include unintentional dictation errors.    [place] Drafts, MD 09/10/17 2029

## 2017-09-13 LAB — EYE CULTURE
Culture: NO GROWTH
Gram Stain: NONE SEEN

## 2017-09-13 NOTE — Pre-Procedure Instructions (Signed)
UC FAXED TO DR MENZ 

## 2017-09-14 NOTE — Pre-Procedure Instructions (Signed)
DR Ginette Pitman REFERRED PATIENT TO DR Nehemiah Massed AND APPT 10/04/17. NOTIFIED CASEY AT DR [facility]

## 2017-10-04 DIAGNOSIS — R0602 Shortness of breath: Secondary | ICD-10-CM | POA: Insufficient documentation

## 2017-10-19 NOTE — Pre-Procedure Instructions (Signed)
[place]

## 2017-10-31 ENCOUNTER — Encounter
Admission: RE | Admit: 2017-10-31 | Discharge: 2017-10-31 | Disposition: A | Discharge status: Home / Self Care | Payer: Medicare Other | Source: Ambulatory Visit | Attending: Orthopedic Surgery | Admitting: Orthopedic Surgery

## 2017-10-31 ENCOUNTER — Other Ambulatory Visit: Payer: Self-pay

## 2017-10-31 NOTE — Pre-Procedure Instructions (Signed)
[name], MD - 10/18/2017 2:30 PM EDT Formatting of this note might be different from the original. Established Patient Visit   Chief Complaint: Chief Complaint  Patient presents with  . Hyperlipidemia  myoview and echo  . Coronary Artery Disease  Date of Service: 10/18/2017 Date of Birth: January 14, 1930 PCP: Azzie Glatter, MD  History of Present Illness: Ms. Jodi Colon is a 84 y.o.female patient  Shortness Of Breath/Dyspnea The patient is having difficulty with acute on chronic dyspnea and or shortness of breath worsening with increased severity over the last 4 months. This occurs with moderate exertion but does not limits ADLs and is also associated with walking and relieved by rest with a duration that is intermittent (<1 minute). Other related symptoms include fatigue. The patient has risk factors of this occurrence including age, postmenopausal female and left hip pain. The source of these symptoms and investigation should include hypertension, valve disease and decrease exercise tolerance  Stress Test Persantine SESTAMIBI was performed showing Normal test.  Mixed Hyperlipidemia The patient has cardiovascular risk factors including High LDL cholesterol and greater than 7.5% 10 year cardiovascular risk score and therefore has been placed on Moderate intensity therapy with atorvastatin (Lipitor) for reduction in LDL and cardiovascular risk for which we have discussed today. Other healthy lifestyle measures have been discussed as well. We have discussed the appropriate treatment goals of the above measures which include a 30% to 50% reduction in LDL levels. The patient has a clear understanding of reasons for lipid management at this time. The patient is currently having no evidence of significant side effects of this medication at this time. Essential Hypertension The patient has a diagnosis of essential hypertension currently stage I without evidence of a secondary cause. We  have discussed long and short term concerns of essential hypertension and its risk for future cardiovascular disease morbidity and mortality. We have discussed treatment options including medication management as well as lifestyle modification and diet. The patient would currently appeared not to require additional medication management and may continue working on non pharmacologic treatment. Perioperative RCRI Risk for Complications with Orthopedic Surgery 1. High risk surgery (intraperitoneal, intrathoracic, Suprainguinal vascular)-no 2. History of make heart disease (history of myocardial infarction, history of positive exercise test, current chest pain considered due to myocardial ischemia, use of nitrate therapy, EKG changes with pathological Q-waves)-no 3. History of congestive heart failure (pulmonary edema, bilateral rales and or S3 gallop, paroxysmal nocturnal dyspnea, chest x-ray showing pulmonary vascular distribution)-no 4. History of cerebral vascular disease (prior TIA and/or stroke)-no 5. Pre-operative treatment with insulin-no 6. Pre-operative creatinine greater than 2 milligrams/deciliter-"no Resultant risk classification: Class II risk,0.9% risk of major cardiac event  Results for orders placed or performed in visit on 10/18/17  Echo complete  Result Value Ref Range  LV Ejection Fraction (%) 55  Aortic Valve Regurgitation Grade trivial  Aortic Valve Stenosis Grade none  Aortic Valve Max Velocity (m/s) 1.6 m/sec  Aortic Valve Stenosis Mean Gradient (mmHg) 5.4 mmHg  Mitral Valve Regurgitation Grade moderate  Mitral Valve Stenosis Grade none  Tricuspid Valve Regurgitation Grade moderate  Tricuspid Valve Regurgitation Max Velocity (m/s) 3 m/sec  Right Ventricle Systolic Pressure (mmHg) 19.7 mmHg  LV End Diastolic Diameter (cm) 4.3 cm  LV End Systolic Diameter (cm) 3.1 cm  LV Septum Wall Thickness (cm) 0.98 cm  LV Posterior Wall Thickness (cm) 0.75 cm  Left Atrium Diameter  (cm) 4 cm  Narrative  CARDIOLOGY DEPARTMENT [place], [place] [id number]  [place] #: [id number] [place], [place], [place] [address] Date: 10/18/2017 11: 65 AM Adult Female Age: 89 yrs ECHOCARDIOGRAM REPORT Outpatient KC^^KCWC STUDY:CHEST WALL TAPE:0000: 00: 0: 00: 00 MD1: Nehemiah Massed, MD Bruce ECHO:Yes DOPPLER:Yes FILE:0000-000-000 BP: 120/70 mmHg COLOR:Yes CONTRAST:No MACHINE:Philips RV BIOPSY:No 3D:No SOUND QLTY:Moderate Height: 57 in MEDIUM:None Weight: 123 lb BSA: 1.5 m2 _________________________________________________________________________________________ HISTORY: DOE REASON: Assess, LV function INDICATION: R06.02 Shortness of breath _________________________________________________________________________________________ ECHOCARDIOGRAPHIC MEASUREMENTS 2D DIMENSIONS AORTA Values Normal Range MAIN PA Values Normal Range Annulus: nm* [2.1-2.5] PA Main: nm* [1.5-2.1] Aorta Sin: nm* [2.7-3.3] RIGHT VENTRICLE ST Junction: nm* [2.3-2.9] RV Base: 3.6 cm [<4.2] Asc.Aorta: nm* [2.3-3.1] RV Mid: nm* [<3.5] LEFT VENTRICLE RV Length: nm* [<8.6] LVIDd: 4.3 cm [3.9-5.3] INFERIOR VENA CAVA LVIDs: 3.1 cm Max. IVC: nm* [<=2.1] FS: 28.3 % [>25] Min. IVC: nm* SWT: 0.98 cm [0.5-0.9] ------------------ PWT: 0.75 cm [0.5-0.9] nm* - not measured LEFT ATRIUM LA Diam: 4.0 cm [2.7-3.8] LA A4C Area: nm* [<20] LA Volume: nm* [22-52] _________________________________________________________________________________________ ECHOCARDIOGRAPHIC DESCRIPTIONS AORTIC ROOT Size: Normal Dissection: INDETERM FOR DISSECTION AORTIC VALVE Leaflets: Tricuspid Morphology: MILDLY THICKENED Mobility: PARTIALLY MOBILE LEFT VENTRICLE Size: Normal Anterior: Normal Contraction: Normal Lateral: Normal Closest EF: >55% (Estimated) Septal: Normal LV Masses: No Masses Apical: Normal LVH: None Inferior: Normal Posterior: Normal Dias.FxClass: (Grade 1) relaxation abnormal, E/A  reversal MITRAL VALVE Leaflets: Normal Mobility: Fully mobile Morphology: Normal LEFT ATRIUM Size: MILDLY ENLARGED LA Masses: No masses IA Septum: Normal IAS MAIN PA Size: Normal PULMONIC VALVE Morphology: Normal Mobility: Fully mobile RIGHT VENTRICLE RV Masses: No Masses Size: Normal Free Wall: Normal Contraction: Normal TRICUSPID VALVE Leaflets: Normal Mobility: Fully mobile Morphology: Normal RIGHT ATRIUM Size: MILDLY ENLARGED RA Other: None RA Mass: No masses PERICARDIUM Fluid: No effusion INFERIOR VENACAVA Size: Normal Normal respiratory collapse _________________________________________________________________________________________  DOPPLER ECHO and OTHER SPECIAL PROCEDURES Aortic: TRIVIAL AR No AS 162.3 cm/sec peak vel 10.5 mmHg peak grad 5.4 mmHg mean grad 2.3 cm^2 by DOPPLER Mitral: MODERATE MR No MS MV Inflow E Vel = 114.0 cm/sec MV Annulus E'Vel = 5.7 cm/sec E/E'Ratio = 20.0 Tricuspid: MODERATE TR No TS 295.4 cm/sec peak TR vel 39.9 mmHg peak RV pressure Pulmonary: TRIVIAL PR No PS _________________________________________________________________________________________ INTERPRETATION NORMAL LEFT VENTRICULAR SYSTOLIC FUNCTION WITH AN ESTIMATED EF = 55 % NORMAL RIGHT VENTRICULAR SYSTOLIC FUNCTION MODERATE TRICUSPID AND MITRAL VALVE INSUFFICIENCY TRACE AORTIC VALVE INSUFFICIENCY NO VALVULAR STENOSIS MILD BIATRIAL ENLARGEMENT _________________________________________________________________________________________ Electronically signed by MD Serafina Royals on 10/18/2017 02: 04 PM Performed By: Cephus Shelling, RVT Ordering Physician: Nehemiah Massed, MD Bruce _________________________________________________________________________________________   Past Medical and Surgical History  Past Medical History Past Medical History:  Diagnosis Date  . Anemia, unspecified  . Bladder cancer (CMS-HCC) 10/2009  under treatment by Dr. Bernardo Heater  . Brain aneurysm    Followed by Neurosurgery  . Cardiac arrhythmia  . Cataract cortical, senile  . Cerebrovascular disease  LE, Facial weakness  . Coronary artery disease 01/2007  s/p PTCA/Stent-Dr Deveshwer  . CVD (cardiovascular disease) 9/08  s/p PTC/stent of R MCA, Deveshwar, [place]: with resolution of daily headaches. still has small R ICA aneurysm, stable. 1.43m  . 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 CDonella Stadeand Dr. COliva Bustard Dr. BFleet Contras . Degenerative joint disease  getting intra articular injections by Dr. CLorre Munroe . Depression  Cymbalta helpful for pain and mood  . Ductal carcinoma in situ of breast  . Furunculosis  . GERD (gastroesophageal reflux disease)  .  Hyperlipidemia 11/08/12  . Hypertension  . Lumbago  . Osteopenia  . Rectal prolapse  . Restless leg syndrome  . Spinal stenosis 03/11/08  thecal sac stenosis and nerve root compression L4-S1, s/p nerve block: Dr. Primus Bravo  . Spinal stenosis  pain clinic  . Thyroid disease  . Urinary incontinence   Past Surgical History She has a past surgical history that includes Cholecystectomy; Breast excisional biopsy (Right, 2007); Compound fracture repair, ankle (Left, 2006); Stent Placement Intracranial Percutaneous (2008); Cataract extraction (2011, 2012); hemorroid; left elbow tendon surgery; Knee surgery, Baker's cysts (2007); Hand surgery; Back surgery (2005); Bladder surgery, cancer (2011); Tonsillectomy; Hysterectomy (1968); Knee arthroscopy; and left carpal tunnel release (Left, 07/15/2017).   Medications and Allergies  Current Medications  Current Outpatient Medications on File Prior to Visit  Medication Sig Dispense Refill  . aspirin 325 MG tablet Take 81 mg by mouth once daily  . atorvastatin (LIPITOR) 20 MG tablet TAKE ONE TABLET BY MOUTH EVERY DAY 90 tablet 1  . fluticasone propionate (FLONASE) 50 mcg/actuation nasal spray by Nasal route  . gabapentin (NEURONTIN) 300  MG capsule TAKE ONE CAPSULE BY MOUTH TWICE A DAY. 60 capsule 2  . HYDROcodone-acetaminophen (NORCO) 5-325 mg tablet Take 1 tablet by mouth nightly as needed for Pain Earliest Fill Date: 08/31/17 30 tablet 0  . levothyroxine (SYNTHROID, LEVOTHROID) 50 MCG tablet TAKE ONE TABLET BY MOUTH DAILY EXCEPT TAKE TWO TABLETS BY MOUTH ON SUNDAY 114 tablet 3  . lisinopril (PRINIVIL,ZESTRIL) 40 MG tablet Take 1 tablet by mouth once daily  . meloxicam (MOBIC) 7.5 MG tablet 1-2 tabs a day as needed 90 tablet 1  . multivitamin (MULTIVITAMIN) tablet Take 1 tablet by mouth daily.  . nortriptyline (PAMELOR) 10 MG capsule TAKE 1 CAPSULE BY MOUTH EVERY EVENING 90 capsule 1  . oxybutynin (DITROPAN) 5 mg tablet Take 5 mg by mouth 2 (two) times daily. Reported on 08/21/2015  . pantoprazole (PROTONIX) 40 MG DR tablet TAKE ONE TABLET BY MOUTH TWICE DAILY 180 tablet 1  . rOPINIRole (REQUIP) 0.5 MG tablet Take 1 tablet (0.5 mg total) by mouth nightly. 90 tablet 1  . timolol maleate (TIMOPTIC) 0.5 % ophthalmic solution  . tiZANidine (ZANAFLEX) 2 MG tablet TAKE ONE TABLET BY MOUTH TWICE DAILY AS NEEDED FOR MUSCLE SPASMS 180 tablet 1  . venlafaxine (EFFEXOR-XR) 75 MG XR capsule Take 1 capsule (75 mg total) by mouth once daily 90 capsule 1   No current facility-administered medications on file prior to visit.   Allergies: Penicillins; Prednisone; Septra [sulfamethoxazole-trimethoprim]; Sulfa (sulfonamide antibiotics); and Sulfacetamide sodium  Social and Family History  Social History reports that she has never smoked. She has never used smokeless tobacco. She reports that she does not drink alcohol or use drugs.  Family History Family History  Problem Relation Age of Onset  . Fibromyalgia Daughter  . High blood pressure (Hypertension) Mother  . Rheum arthritis Mother  . High blood pressure (Hypertension) Father  . Myocardial Infarction (Heart attack) Father  . Stroke Father   Review of Systems   Review of Systems    Positive for sob fatigue limb Negative for weight gain weight loss, weakness, vision change, hearing loss, cough, congestion, PND, orthopnea, heartburn, nausea, diaphoresis, vomiting, diarrhea, bloody stool, melena, stomach pain, , leg cramping, leg blood clots, headache, blackouts, nosebleed, trouble swallowing, mouth pain, urinary frequency, urination at night, muscle weakness, skin lesions, skin rashes, tingling ,ulcers, numbness, anxiety, and/or depression Physical Examination   Vitals:BP 126/70  Pulse 69  Ht 144.8 cm (4' 9" )  Wt 55.8 kg (123 lb)  LMP (LMP Unknown)  BMI 26.62 kg/m  Ht:144.8 cm (4' 9" ) Wt:55.8 kg (123 lb) HWE:XHBZ surface area is 1.5 meters squared. Body mass index is 26.62 kg/m. Appearance: well appearing in no acute distress HEENT: Pupils equally reactive to light and accomodation, no xanthalasma  Neck: Supple, no apparent thyromegaly, masses, or lymphadenopathy  Lungs: normal respiratory effort; no crackles, no rhonchi, no wheezes Heart: Regular rate and rhythm. Normal S1 S2 No gallops, murmur, no rub, PMI is normal size and placement. carotid upstroke normal without bruit. Jugular venous pressure is normal Abdomen: soft, nontender, not distended with normal bowel sounds. No apparent hepatosplenomegally. Abdominal aorta is normal size without bruit Extremities: no ulcers, no clubbing, no cyanosis Peripheral Pulses: 2+ in upper extremities, 2+ femoral pulses bilaterally, 2+lower extremity  Musculoskeletal; Normal muscle tone without kyphosis Neurological: Oriented and Alert, Cranial nerves intact  Assessment   84 y.o. female with  Encounter Diagnoses  Name Primary?  . Essential hypertension Yes  . SOBOE (shortness of breath on exertion)  . Pure hypercholesterolemia   Plan  -Proceed to surgery and/or invasive procedure without restriction to pre or post operative and/or procedural care. The patient is at lowest risk possible for cardiovascular complications  with surgical intervention and/or invasive procedure. Currently has no evidence active and/or significant angina and/or congestive heart failure. The patient may discontinue aspirin 7 days prior to procedure and restart at a safe period thereafter -No addition of medications at this time for essential hypertension. We have discussed the need for continued monitoring in the future.  -Continue to use moderate to high intensive cholesterol therapy for further future risk reduction in cardiovascular disease and complication. The patient currently understands the goals, risks, and benefits of lipid treatment. We have discussed the potential side effects profile of these medications and or symptoms. They will watch for any new symptoms. -We have had a long discussion about the benefits of physical and occupational rehabilitation. The patient is advised and encouraged to enroll for improvements in quality of life and reduced hospitalization. -No further intervention of valve disease multifactorial in nature and currently reasonably stable with current treatment.   No orders of the defined types were placed in this encounter.  No follow-ups on file.  [name], MD    Electronically signed by [name], MD at 10/18/2017 2:39 PM EDT     Plan of Treatment - documented as of this encounter  Upcoming Encounters Upcoming Encounters  Date Type Specialty Care Team Description  11/02/2017 Office Visit Internal Medicine Azzie Glatter, MD  [address]  [place], [place] [address]  [phone]  [phone] (Fax)    11/23/2017 Post Op Orthopaedics Feliberto Gottron, [place]  [address]  [place], [place] [address]  [phone]  [phone] (Fax)    12/23/2017 Post Op Orthopaedics Lauris Poag, MD  [address]  [facility] [place],  [address]  [phone]  [phone]  (Fax)     Goals - documented as of this encounter  Goal Patient Goal Type Associated Problems Recent Progress Patient-Stated? Author  Blood Pressure < 130/80  Blood Pressure  On track (09/13/2017) No Yvonne Kendall, [place]   Visit Diagnoses - documented in this encounter  Diagnosis  Essential hypertension - Primary   SOBOE (shortness of breath on exertion)  Shortness of breath   Pure hypercholesterolemia    Images Patient Contacts  Contact Name Contact Address Communication Relationship to Patient  Marland Mcalpine Unknown [phone] [facility]) Son or Daughter, Kaelei Wheeler Unknown [phone] [facility]) Son or Daughter, Emergency Contact   Document Information  Primary Care Provider Other Service Providers Document Coverage Dates  Vishwanath Lovett Calender MD (Sep. 30, 2016September 30, 2016 - Present) [phone] (Work) [phone] (Fax) [address] [place], [place] [address]   May 21, 2019May 21, 2019 - May 25, 2019May 25, 2019   [place], [place] [address]   Encounter Providers Encounter Date  [name] MD (Attending) [phone] (Work) [phone] (Fax) [address] [facility] [place], [place] [address]  May 21, 2019May 21, 2019 - May 25, 2019May 25, 2019

## 2017-10-31 NOTE — Patient Instructions (Signed)
Your procedure is scheduled on: 11-08-17 TUESDAY Report to Same Day Surgery 2nd floor medical mall [facility] Entrance-take elevator on left to 2nd floor.  Check in with surgery information desk.) To find out your arrival time please call [phone] between 1PM - 3PM on 11-07-17 MONDAY   Remember: Instructions that are not followed completely may result in serious medical risk, up to and including death, or upon the discretion of your surgeon and anesthesiologist your surgery may need to be rescheduled.    _x___ 1. Do not eat food after midnight the night before your procedure.  NO GUM OR CANDY AFTER MIDNIGHT.  You may drink clear liquids up to 2 hours before you are scheduled to arrive at the hospital for your procedure.  Do not drink clear liquids within 2 hours of your scheduled arrival to the hospital.  Clear liquids include  --Water or Apple juice without pulp  --Clear carbohydrate beverage such as ClearFast or Gatorade  --Black Coffee or Clear Tea (No milk, no creamers, do not add anything to the coffee or Tea     __x__ 2. No Alcohol for 24 hours before or after surgery.   __x__3. No Smoking or e-cigarettes for 24 prior to surgery.  Do not use any chewable tobacco products for at least 6 hour prior to surgery   ____  4. Bring all medications with you on the day of surgery if instructed.    __x__ 5. Notify your doctor if there is any change in your medical condition     (cold, fever, infections).    x___6. On the morning of surgery brush your teeth with toothpaste and water.  You may rinse your mouth with mouth wash if you wish.  Do not swallow any toothpaste or mouthwash.   Do not wear jewelry, make-up, hairpins, clips or nail polish.  Do not wear lotions, powders, or perfumes. You may wear deodorant.  Do not shave 48 hours prior to surgery. Men may shave face and neck.  Do not bring valuables to the hospital.    [facility] is not responsible for any belongings or  valuables.               Contacts, dentures or bridgework may not be worn into surgery.  Leave your suitcase in the car. After surgery it may be brought to your room.  For patients admitted to the hospital, discharge time is determined by your treatment team.  _  Patients discharged the day of surgery will not be allowed to drive home.  You will need someone to drive you home and stay with you the night of your procedure.    Please read over the following fact sheets that you were given:   [facility] Preparing for Surgery and or MRSA Information   _x___ TAKE THE FOLLOWING MEDICATION THE MORNING OF YOUR SURGERY WITH A SMALL SIP OF WATER. These include:  1. LIPITOR (ATORVASTATIN)  2. GABAPENTIN (NEURONTIN)  3. LEVOTHYROXINE (SYNTHROID)  4. OXYBUTYNIN (DITROPAN)  5. PROTONIX (PANTOPRAZOLE)  6. ZANAFLEX (TIZANIDINE)  7. EFFEXOR (VENLAFAXINE)  ____Fleets enema or Magnesium Citrate as directed.   _x___ Use CHG Soap or sage wipes as directed on instruction sheet   ____ Use inhalers on the day of surgery and bring to hospital day of surgery  ____ Stop Metformin and Janumet 2 days prior to surgery.    ____ Take 1/2 of usual insulin dose the night before surgery and none on the morning surgery.  ____ Follow recommendations from Cardiologist, Pulmonologist or PCP regarding stopping Aspirin, Coumadin, Plavix ,Eliquis, Effient, or Pradaxa, and Pletal.  X____Stop Anti-inflammatories such as Advil, Aleve, Ibuprofen, Motrin, Naproxen, MOBIC, Naprosyn, Goodies powders or aspirin products NOW-OK to take Tylenol    ____ Stop supplements until after surgery.     ____ Bring C-Pap to the hospital.

## 2017-11-01 NOTE — Pre-Procedure Instructions (Signed)
CALLED Jodi Colon AT [facility] OFFICE REGARDING URINE-PT WAS RESCHEDULED AND IS HAVING TO COME BACK IN FOR T&S AND TO HAVE MRSA SWAB RECHECKED-ASKING IF ANOTHER URINE NEEDS TO BE DONE AND Jodi Colon SAID NO

## 2017-11-02 ENCOUNTER — Inpatient Hospital Stay: Admission: RE | Admit: 2017-11-02 | Payer: Medicare Other | Source: Ambulatory Visit

## 2017-11-03 ENCOUNTER — Encounter
Admission: RE | Admit: 2017-11-03 | Discharge: 2017-11-03 | Disposition: A | Discharge status: Home / Self Care | Payer: Medicare Other | Source: Ambulatory Visit | Attending: Orthopedic Surgery | Admitting: Orthopedic Surgery

## 2017-11-03 DIAGNOSIS — Z01812 Encounter for preprocedural laboratory examination: Secondary | ICD-10-CM | POA: Diagnosis not present

## 2017-11-03 LAB — URINALYSIS, ROUTINE W REFLEX MICROSCOPIC
Bilirubin Urine: NEGATIVE
Glucose, UA: NEGATIVE mg/dL
Hgb urine dipstick: NEGATIVE
Ketones, ur: NEGATIVE mg/dL
Leukocytes, UA: NEGATIVE
Nitrite: NEGATIVE
Protein, ur: NEGATIVE mg/dL
Specific Gravity, Urine: 1.004 — ABNORMAL LOW (ref 1.005–1.030)
pH: 7 (ref 5.0–8.0)

## 2017-11-03 LAB — SURGICAL PCR SCREEN
MRSA, PCR: NEGATIVE
Staphylococcus aureus: NEGATIVE

## 2017-11-03 LAB — TYPE AND SCREEN
ABO/RH(D): A NEG
Antibody Screen: NEGATIVE

## 2017-11-07 MED ORDER — CLINDAMYCIN PHOSPHATE 900 MG/50ML IV SOLN
900.0000 mg | Freq: Once | INTRAVENOUS | Status: AC
  Administered 2017-11-08: 900 mg via INTRAVENOUS

## 2017-11-07 NOTE — Pre-Procedure Instructions (Signed)
I called Jodi Colon at [facility] office regarding updated orders that were faxed. The updated set of orders does not have TXA on them while the old orders do.  I was calling to confirm that Dr Rudene Christians does not want the TXA.  Jodi Colon said that the pt has had a lot of new cardiac issues and that Dr Rudene Christians does not want this ordered.  Order to be dc'd in Epic under verbal so Dr Rudene Christians will have to sign this

## 2017-11-08 ENCOUNTER — Inpatient Hospital Stay
Admission: RE | Admit: 2017-11-08 | Discharge: 2017-11-12 | DRG: 470 | Disposition: A | Discharge status: Home Health | Payer: Medicare Other | Attending: Orthopedic Surgery | Admitting: Orthopedic Surgery

## 2017-11-08 ENCOUNTER — Inpatient Hospital Stay: Payer: Medicare Other

## 2017-11-08 ENCOUNTER — Other Ambulatory Visit: Payer: Self-pay

## 2017-11-08 ENCOUNTER — Encounter: Payer: Self-pay | Admitting: *Deleted

## 2017-11-08 ENCOUNTER — Inpatient Hospital Stay: Payer: Medicare Other | Admitting: Anesthesiology

## 2017-11-08 ENCOUNTER — Encounter
Admission: RE | Disposition: A | Discharge status: Home Health | Payer: Self-pay | Source: Home / Self Care | Attending: Orthopedic Surgery

## 2017-11-08 DIAGNOSIS — I739 Peripheral vascular disease, unspecified: Secondary | ICD-10-CM | POA: Diagnosis present

## 2017-11-08 DIAGNOSIS — Z7982 Long term (current) use of aspirin: Secondary | ICD-10-CM

## 2017-11-08 DIAGNOSIS — Z79890 Hormone replacement therapy (postmenopausal): Secondary | ICD-10-CM

## 2017-11-08 DIAGNOSIS — G2581 Restless legs syndrome: Secondary | ICD-10-CM | POA: Diagnosis present

## 2017-11-08 DIAGNOSIS — Z8679 Personal history of other diseases of the circulatory system: Secondary | ICD-10-CM | POA: Diagnosis not present

## 2017-11-08 DIAGNOSIS — Z923 Personal history of irradiation: Secondary | ICD-10-CM | POA: Diagnosis not present

## 2017-11-08 DIAGNOSIS — E785 Hyperlipidemia, unspecified: Secondary | ICD-10-CM | POA: Diagnosis present

## 2017-11-08 DIAGNOSIS — M25552 Pain in left hip: Secondary | ICD-10-CM | POA: Diagnosis present

## 2017-11-08 DIAGNOSIS — E039 Hypothyroidism, unspecified: Secondary | ICD-10-CM | POA: Diagnosis present

## 2017-11-08 DIAGNOSIS — G8918 Other acute postprocedural pain: Secondary | ICD-10-CM

## 2017-11-08 DIAGNOSIS — Z8673 Personal history of transient ischemic attack (TIA), and cerebral infarction without residual deficits: Secondary | ICD-10-CM | POA: Diagnosis not present

## 2017-11-08 DIAGNOSIS — Z8614 Personal history of Methicillin resistant Staphylococcus aureus infection: Secondary | ICD-10-CM | POA: Diagnosis not present

## 2017-11-08 DIAGNOSIS — Z8551 Personal history of malignant neoplasm of bladder: Secondary | ICD-10-CM | POA: Diagnosis not present

## 2017-11-08 DIAGNOSIS — Z96642 Presence of left artificial hip joint: Secondary | ICD-10-CM

## 2017-11-08 DIAGNOSIS — K449 Diaphragmatic hernia without obstruction or gangrene: Secondary | ICD-10-CM | POA: Diagnosis present

## 2017-11-08 DIAGNOSIS — M1612 Unilateral primary osteoarthritis, left hip: Principal | ICD-10-CM | POA: Diagnosis present

## 2017-11-08 DIAGNOSIS — Z955 Presence of coronary angioplasty implant and graft: Secondary | ICD-10-CM

## 2017-11-08 DIAGNOSIS — I2510 Atherosclerotic heart disease of native coronary artery without angina pectoris: Secondary | ICD-10-CM | POA: Diagnosis present

## 2017-11-08 DIAGNOSIS — K219 Gastro-esophageal reflux disease without esophagitis: Secondary | ICD-10-CM | POA: Diagnosis present

## 2017-11-08 DIAGNOSIS — Z419 Encounter for procedure for purposes other than remedying health state, unspecified: Secondary | ICD-10-CM

## 2017-11-08 DIAGNOSIS — Z79899 Other long term (current) drug therapy: Secondary | ICD-10-CM

## 2017-11-08 DIAGNOSIS — Z853 Personal history of malignant neoplasm of breast: Secondary | ICD-10-CM

## 2017-11-08 DIAGNOSIS — Z95828 Presence of other vascular implants and grafts: Secondary | ICD-10-CM | POA: Diagnosis not present

## 2017-11-08 DIAGNOSIS — I10 Essential (primary) hypertension: Secondary | ICD-10-CM | POA: Diagnosis present

## 2017-11-08 HISTORY — PX: TOTAL HIP ARTHROPLASTY: SHX124

## 2017-11-08 SURGERY — ARTHROPLASTY, HIP, TOTAL, ANTERIOR APPROACH
Anesthesia: Spinal | Site: Hip | Laterality: Left | Wound class: Clean

## 2017-11-08 MED ORDER — LACTATED RINGERS IV SOLN
INTRAVENOUS | Status: DC
  Administered 2017-11-08: 17:00:00 via INTRAVENOUS

## 2017-11-08 MED ORDER — GABAPENTIN 300 MG PO CAPS
300.0000 mg | ORAL_CAPSULE | Freq: Two times a day (BID) | ORAL | Status: DC
  Administered 2017-11-08 – 2017-11-12 (×8): 300 mg via ORAL
  Filled 2017-11-08 (×8): qty 1

## 2017-11-08 MED ORDER — NEOMYCIN-POLYMYXIN B GU 40-200000 IR SOLN
Status: DC | PRN
  Administered 2017-11-08: 4 mL

## 2017-11-08 MED ORDER — BISACODYL 5 MG PO TBEC
5.0000 mg | DELAYED_RELEASE_TABLET | Freq: Every day | ORAL | Status: DC | PRN
Start: 2017-11-08 — End: 2017-11-12
  Administered 2017-11-12: 5 mg via ORAL
  Filled 2017-11-08: qty 1

## 2017-11-08 MED ORDER — TIZANIDINE HCL 2 MG PO TABS
2.0000 mg | ORAL_TABLET | Freq: Two times a day (BID) | ORAL | Status: DC
  Administered 2017-11-08 – 2017-11-11 (×6): 2 mg via ORAL
  Filled 2017-11-08 (×9): qty 1

## 2017-11-08 MED ORDER — ADULT MULTIVITAMIN W/MINERALS CH
1.0000 | ORAL_TABLET | Freq: Every day | ORAL | Status: DC
  Administered 2017-11-09 – 2017-11-12 (×4): 1 via ORAL
  Filled 2017-11-08 (×4): qty 1

## 2017-11-08 MED ORDER — ROPINIROLE HCL 1 MG PO TABS
0.5000 mg | ORAL_TABLET | Freq: Every day | ORAL | Status: DC
  Administered 2017-11-08 – 2017-11-11 (×4): 0.5 mg via ORAL
  Filled 2017-11-08 (×4): qty 1

## 2017-11-08 MED ORDER — VENLAFAXINE HCL ER 75 MG PO CP24
75.0000 mg | ORAL_CAPSULE | Freq: Every day | ORAL | Status: DC
  Administered 2017-11-09 – 2017-11-12 (×4): 75 mg via ORAL
  Filled 2017-11-08 (×4): qty 1

## 2017-11-08 MED ORDER — OXYBUTYNIN CHLORIDE 5 MG PO TABS
5.0000 mg | ORAL_TABLET | Freq: Two times a day (BID) | ORAL | Status: DC
  Administered 2017-11-08 – 2017-11-12 (×8): 5 mg via ORAL
  Filled 2017-11-08 (×9): qty 1

## 2017-11-08 MED ORDER — PANTOPRAZOLE SODIUM 40 MG PO TBEC
40.0000 mg | DELAYED_RELEASE_TABLET | Freq: Two times a day (BID) | ORAL | Status: DC
  Administered 2017-11-09 – 2017-11-12 (×7): 40 mg via ORAL
  Filled 2017-11-08 (×7): qty 1

## 2017-11-08 MED ORDER — CLINDAMYCIN PHOSPHATE 900 MG/50ML IV SOLN
INTRAVENOUS | Status: AC
  Filled 2017-11-08: qty 50

## 2017-11-08 MED ORDER — ONDANSETRON HCL 4 MG PO TABS: 4.0000 mg | ORAL_TABLET | Freq: Four times a day (QID) | ORAL | Status: DC | PRN

## 2017-11-08 MED ORDER — HYDROCODONE-ACETAMINOPHEN 7.5-325 MG PO TABS
1.0000 | ORAL_TABLET | ORAL | Status: DC | PRN
  Administered 2017-11-09 (×2): 1 via ORAL
  Filled 2017-11-08 (×2): qty 1

## 2017-11-08 MED ORDER — MIDAZOLAM HCL 5 MG/5ML IJ SOLN
INTRAMUSCULAR | Status: DC | PRN
  Administered 2017-11-08: .5 mg via INTRAVENOUS
  Administered 2017-11-08: 1.5 mg via INTRAVENOUS

## 2017-11-08 MED ORDER — METHOCARBAMOL 500 MG PO TABS: 500.0000 mg | ORAL_TABLET | Freq: Four times a day (QID) | ORAL | Status: DC | PRN

## 2017-11-08 MED ORDER — ONDANSETRON HCL 4 MG/2ML IJ SOLN: 4.0000 mg | Freq: Once | INTRAMUSCULAR | Status: DC | PRN

## 2017-11-08 MED ORDER — ATORVASTATIN CALCIUM 20 MG PO TABS
20.0000 mg | ORAL_TABLET | ORAL | Status: DC
  Administered 2017-11-09 – 2017-11-12 (×4): 20 mg via ORAL
  Filled 2017-11-08 (×4): qty 1

## 2017-11-08 MED ORDER — ASPIRIN 81 MG PO CHEW
81.0000 mg | CHEWABLE_TABLET | Freq: Two times a day (BID) | ORAL | Status: DC
  Administered 2017-11-08 – 2017-11-12 (×8): 81 mg via ORAL
  Filled 2017-11-08 (×8): qty 1

## 2017-11-08 MED ORDER — SODIUM CHLORIDE 0.9 % IV SOLN
INTRAVENOUS | Status: DC
  Administered 2017-11-08: 20:00:00 via INTRAVENOUS

## 2017-11-08 MED ORDER — BUPIVACAINE-EPINEPHRINE (PF) 0.25% -1:200000 IJ SOLN
INTRAMUSCULAR | Status: AC
  Filled 2017-11-08: qty 30

## 2017-11-08 MED ORDER — SENNOSIDES-DOCUSATE SODIUM 8.6-50 MG PO TABS: 1.0000 | ORAL_TABLET | Freq: Every evening | ORAL | Status: DC | PRN

## 2017-11-08 MED ORDER — ZOLPIDEM TARTRATE 5 MG PO TABS: 5.0000 mg | ORAL_TABLET | Freq: Every evening | ORAL | Status: DC | PRN

## 2017-11-08 MED ORDER — NEOMYCIN-POLYMYXIN B GU 40-200000 IR SOLN
Status: AC
  Filled 2017-11-08: qty 4

## 2017-11-08 MED ORDER — DIPHENHYDRAMINE HCL 12.5 MG/5ML PO ELIX: 12.5000 mg | ORAL_SOLUTION | ORAL | Status: DC | PRN

## 2017-11-08 MED ORDER — ALUM & MAG HYDROXIDE-SIMETH 200-200-20 MG/5ML PO SUSP: 30.0000 mL | ORAL | Status: DC | PRN

## 2017-11-08 MED ORDER — METHOCARBAMOL 1000 MG/10ML IJ SOLN
500.0000 mg | Freq: Four times a day (QID) | INTRAVENOUS | Status: DC | PRN
  Filled 2017-11-08: qty 5

## 2017-11-08 MED ORDER — METOCLOPRAMIDE HCL 5 MG/ML IJ SOLN: 5.0000 mg | Freq: Three times a day (TID) | INTRAMUSCULAR | Status: DC | PRN

## 2017-11-08 MED ORDER — HYDROCORTISONE 2.5 % EX CREA
1.0000 "application " | TOPICAL_CREAM | Freq: Every day | CUTANEOUS | Status: DC | PRN
  Filled 2017-11-08: qty 30

## 2017-11-08 MED ORDER — SODIUM CHLORIDE 0.9 % IV SOLN
INTRAVENOUS | Status: DC | PRN
  Administered 2017-11-08: 25 ug/min via INTRAVENOUS

## 2017-11-08 MED ORDER — PHENOL 1.4 % MT LIQD
1.0000 | OROMUCOSAL | Status: DC | PRN
  Filled 2017-11-08: qty 177

## 2017-11-08 MED ORDER — METOCLOPRAMIDE HCL 10 MG PO TABS: 5.0000 mg | ORAL_TABLET | Freq: Three times a day (TID) | ORAL | Status: DC | PRN

## 2017-11-08 MED ORDER — HYDRALAZINE HCL 20 MG/ML IJ SOLN
10.0000 mg | Freq: Once | INTRAMUSCULAR | Status: AC
  Administered 2017-11-08: 10 mg via INTRAVENOUS

## 2017-11-08 MED ORDER — PROPOFOL 500 MG/50ML IV EMUL
INTRAVENOUS | Status: DC | PRN
  Administered 2017-11-08: 25 ug/kg/min via INTRAVENOUS

## 2017-11-08 MED ORDER — MENTHOL 3 MG MT LOZG
1.0000 | LOZENGE | OROMUCOSAL | Status: DC | PRN
  Filled 2017-11-08: qty 9

## 2017-11-08 MED ORDER — BUPIVACAINE HCL (PF) 0.5 % IJ SOLN
INTRAMUSCULAR | Status: DC | PRN
  Administered 2017-11-08: 2.8 mL

## 2017-11-08 MED ORDER — DOCUSATE SODIUM 100 MG PO CAPS
100.0000 mg | ORAL_CAPSULE | Freq: Two times a day (BID) | ORAL | Status: DC
  Administered 2017-11-10 – 2017-11-12 (×3): 100 mg via ORAL
  Filled 2017-11-08 (×5): qty 1

## 2017-11-08 MED ORDER — ACETAMINOPHEN 325 MG PO TABS
325.0000 mg | ORAL_TABLET | Freq: Four times a day (QID) | ORAL | Status: DC | PRN
  Administered 2017-11-10 – 2017-11-12 (×2): 650 mg via ORAL
  Filled 2017-11-08 (×3): qty 2

## 2017-11-08 MED ORDER — MIDAZOLAM HCL 2 MG/2ML IJ SOLN
INTRAMUSCULAR | Status: AC
  Filled 2017-11-08: qty 2

## 2017-11-08 MED ORDER — CLINDAMYCIN PHOSPHATE 600 MG/50ML IV SOLN
600.0000 mg | Freq: Four times a day (QID) | INTRAVENOUS | Status: AC
  Administered 2017-11-08 – 2017-11-09 (×2): 600 mg via INTRAVENOUS
  Filled 2017-11-08 (×2): qty 50

## 2017-11-08 MED ORDER — TRAMADOL HCL 50 MG PO TABS
50.0000 mg | ORAL_TABLET | Freq: Four times a day (QID) | ORAL | Status: DC
  Administered 2017-11-08 – 2017-11-11 (×6): 50 mg via ORAL
  Filled 2017-11-08 (×6): qty 1

## 2017-11-08 MED ORDER — MORPHINE SULFATE (PF) 2 MG/ML IV SOLN: 0.5000 mg | INTRAVENOUS | Status: DC | PRN

## 2017-11-08 MED ORDER — MAGNESIUM CITRATE PO SOLN
1.0000 | Freq: Once | ORAL | Status: DC | PRN
  Filled 2017-11-08: qty 296

## 2017-11-08 MED ORDER — ACETAMINOPHEN 500 MG PO TABS
500.0000 mg | ORAL_TABLET | Freq: Four times a day (QID) | ORAL | Status: AC
  Administered 2017-11-08 – 2017-11-09 (×4): 500 mg via ORAL
  Filled 2017-11-08 (×3): qty 1

## 2017-11-08 MED ORDER — FLUTICASONE PROPIONATE 50 MCG/ACT NA SUSP
1.0000 | Freq: Every day | NASAL | Status: DC | PRN
  Administered 2017-11-09 – 2017-11-12 (×4): 1 via NASAL
  Filled 2017-11-08 (×4): qty 16

## 2017-11-08 MED ORDER — HYDROCODONE-ACETAMINOPHEN 5-325 MG PO TABS
1.0000 | ORAL_TABLET | ORAL | Status: DC | PRN
  Administered 2017-11-08 – 2017-11-10 (×2): 1 via ORAL
  Filled 2017-11-08 (×2): qty 1

## 2017-11-08 MED ORDER — TIMOLOL MALEATE 0.5 % OP SOLN
1.0000 [drp] | Freq: Every day | OPHTHALMIC | Status: DC
  Administered 2017-11-09 – 2017-11-12 (×4): 1 [drp] via OPHTHALMIC
  Filled 2017-11-08: qty 5

## 2017-11-08 MED ORDER — BUPIVACAINE-EPINEPHRINE (PF) 0.25% -1:200000 IJ SOLN
INTRAMUSCULAR | Status: DC | PRN
  Administered 2017-11-08: 30 mL via PERINEURAL

## 2017-11-08 MED ORDER — NORTRIPTYLINE HCL 10 MG PO CAPS
10.0000 mg | ORAL_CAPSULE | Freq: Every day | ORAL | Status: DC
  Administered 2017-11-09 – 2017-11-11 (×3): 10 mg via ORAL
  Filled 2017-11-08 (×5): qty 1

## 2017-11-08 MED ORDER — PROPOFOL 10 MG/ML IV BOLUS
INTRAVENOUS | Status: DC | PRN
  Administered 2017-11-08: 25 mg via INTRAVENOUS

## 2017-11-08 MED ORDER — HYDRALAZINE HCL 20 MG/ML IJ SOLN
INTRAMUSCULAR | Status: AC
  Administered 2017-11-08: 10 mg via INTRAVENOUS
  Filled 2017-11-08: qty 1

## 2017-11-08 MED ORDER — LORATADINE 10 MG PO TABS
10.0000 mg | ORAL_TABLET | Freq: Every day | ORAL | Status: DC
  Administered 2017-11-09 – 2017-11-12 (×4): 10 mg via ORAL
  Filled 2017-11-08 (×4): qty 1

## 2017-11-08 MED ORDER — LEVOTHYROXINE SODIUM 50 MCG PO TABS
50.0000 ug | ORAL_TABLET | Freq: Every day | ORAL | Status: DC
  Administered 2017-11-09 – 2017-11-12 (×4): 50 ug via ORAL
  Filled 2017-11-08 (×4): qty 1

## 2017-11-08 MED ORDER — FENTANYL CITRATE (PF) 100 MCG/2ML IJ SOLN: 25.0000 ug | INTRAMUSCULAR | Status: DC | PRN

## 2017-11-08 MED ORDER — ONDANSETRON HCL 4 MG/2ML IJ SOLN
4.0000 mg | Freq: Four times a day (QID) | INTRAMUSCULAR | Status: DC | PRN
  Administered 2017-11-08: 4 mg via INTRAVENOUS
  Filled 2017-11-08: qty 2

## 2017-11-08 MED ORDER — PROPOFOL 500 MG/50ML IV EMUL
INTRAVENOUS | Status: AC
  Filled 2017-11-08: qty 50

## 2017-11-08 SURGICAL SUPPLY — 56 items
BLADE SAGITTAL AGGR TOOTH XLG (BLADE) ×2 IMPLANT
BNDG COHESIVE 6X5 TAN STRL LF (GAUZE/BANDAGES/DRESSINGS) ×6 IMPLANT
CANISTER SUCT 1200ML W/VALVE (MISCELLANEOUS) ×2 IMPLANT
CHLORAPREP W/TINT 26ML (MISCELLANEOUS) ×2 IMPLANT
DRAPE C-ARM XRAY 36X54 (DRAPES) ×2 IMPLANT
DRAPE INCISE IOBAN 66X60 STRL (DRAPES) IMPLANT
DRAPE POUCH INSTRU U-SHP 10X18 (DRAPES) ×2 IMPLANT
DRAPE SHEET LG 3/4 BI-LAMINATE (DRAPES) ×6 IMPLANT
DRAPE TABLE BACK 80X90 (DRAPES) ×2 IMPLANT
DRESSING SURGICEL FIBRLLR 1X2 (HEMOSTASIS) ×2 IMPLANT
DRSG OPSITE POSTOP 4X8 (GAUZE/BANDAGES/DRESSINGS) ×4 IMPLANT
DRSG SURGICEL FIBRILLAR 1X2 (HEMOSTASIS) ×4
ELECT BLADE 6.5 EXT (BLADE) ×2 IMPLANT
ELECT REM PT RETURN 9FT ADLT (ELECTROSURGICAL) ×2
ELECTRODE REM PT RTRN 9FT ADLT (ELECTROSURGICAL) ×1 IMPLANT
GLOVE BIOGEL PI IND STRL 9 (GLOVE) ×1 IMPLANT
GLOVE BIOGEL PI INDICATOR 9 (GLOVE) ×1
GLOVE SURG SYN 9.0  PF PI (GLOVE) ×2
GLOVE SURG SYN 9.0 PF PI (GLOVE) ×2 IMPLANT
GOWN SRG 2XL LVL 4 RGLN SLV (GOWNS) ×1 IMPLANT
GOWN STRL NON-REIN 2XL LVL4 (GOWNS) ×1
GOWN STRL REUS W/ TWL LRG LVL3 (GOWN DISPOSABLE) ×1 IMPLANT
GOWN STRL REUS W/TWL LRG LVL3 (GOWN DISPOSABLE) ×1
GRADUATE 1200CC STRL 31836 (MISCELLANEOUS) ×2 IMPLANT
HEMOVAC 400CC 10FR (MISCELLANEOUS) IMPLANT
HIP FEM HD S 28 (Head) ×2 IMPLANT
HOLDER FOLEY CATH W/STRAP (MISCELLANEOUS) ×2 IMPLANT
HOOD PEEL AWAY FLYTE STAYCOOL (MISCELLANEOUS) ×2 IMPLANT
KIT PREVENA INCISION MGT 13 (CANNISTER) ×2 IMPLANT
LINER DM 28MM (Liner) ×2 IMPLANT
MAT BLUE FLOOR 46X72 FLO (MISCELLANEOUS) ×2 IMPLANT
NDL SAFETY ECLIPSE 18X1.5 (NEEDLE) ×1 IMPLANT
NEEDLE HYPO 18GX1.5 SHARP (NEEDLE) ×1
NEEDLE SPNL 18GX3.5 QUINCKE PK (NEEDLE) ×2 IMPLANT
NS IRRIG 1000ML POUR BTL (IV SOLUTION) ×2 IMPLANT
PACK HIP COMPR (MISCELLANEOUS) ×2 IMPLANT
SHELL ACETABULAR DM  48MM (Shell) ×2 IMPLANT
SOL PREP PVP 2OZ (MISCELLANEOUS) ×2
SOLUTION PREP PVP 2OZ (MISCELLANEOUS) ×1 IMPLANT
SPONGE DRAIN TRACH 4X4 STRL 2S (GAUZE/BANDAGES/DRESSINGS) IMPLANT
STAPLER SKIN PROX 35W (STAPLE) ×2 IMPLANT
STEM FEMORAL SZ0 STD COLLARED (Stem) ×2 IMPLANT
STRAP SAFETY 5IN WIDE (MISCELLANEOUS) ×2 IMPLANT
SUT DVC 2 QUILL PDO  T11 36X36 (SUTURE) ×1
SUT DVC 2 QUILL PDO T11 36X36 (SUTURE) ×1 IMPLANT
SUT SILK 0 (SUTURE) ×1
SUT SILK 0 30XBRD TIE 6 (SUTURE) ×1 IMPLANT
SUT V-LOC 90 ABS DVC 3-0 CL (SUTURE) ×2 IMPLANT
SUT VIC AB 1 CT1 36 (SUTURE) ×2 IMPLANT
SYR 20CC LL (SYRINGE) ×2 IMPLANT
SYR 30ML LL (SYRINGE) ×2 IMPLANT
SYR BULB IRRIG 60ML STRL (SYRINGE) ×2 IMPLANT
TAPE MICROFOAM 4IN (TAPE) IMPLANT
TOWEL OR 17X26 4PK STRL BLUE (TOWEL DISPOSABLE) ×2 IMPLANT
TRAY FOLEY MTR SLVR 16FR STAT (SET/KITS/TRAYS/PACK) ×2 IMPLANT
WND VAC CANISTER 500ML (MISCELLANEOUS) ×2 IMPLANT

## 2017-11-08 NOTE — Transfer of Care (Signed)
Immediate Anesthesia Transfer of Care Note  Patient: Jodi Colon  Procedure(s) Performed: TOTAL HIP ARTHROPLASTY ANTERIOR APPROACH (Left Hip)  Patient Location: PACU  Anesthesia Type:Spinal  Level of Consciousness: awake, alert , oriented and patient cooperative  Airway & Oxygen Therapy: Patient Spontanous Breathing  Post-op Assessment: Report given to RN and Post -op Vital signs reviewed and stable  Post vital signs: Reviewed and stable  Last Vitals:  Vitals Value Taken Time  BP 109/54 11/08/2017  6:33 PM  Temp 36.6 C 11/08/2017  6:33 PM  Pulse 58 11/08/2017  6:36 PM  Resp 17 11/08/2017  6:36 PM  SpO2 97 % 11/08/2017  6:36 PM  Vitals shown include unvalidated device data.  Last Pain:  Vitals:   11/08/17 1502  TempSrc: Temporal  PainSc: 4          Complications: No apparent anesthesia complications

## 2017-11-08 NOTE — Anesthesia Post-op Follow-up Note (Signed)
Anesthesia QCDR form completed.        

## 2017-11-08 NOTE — H&P (Signed)
Reviewed paper H+P, will be scanned into chart. No changes noted.  

## 2017-11-08 NOTE — Anesthesia Preprocedure Evaluation (Signed)
Anesthesia Evaluation  Patient identified by MRN, date of birth, ID band Patient awake    Reviewed: Allergy & Precautions, NPO status , Patient's Chart, lab work & pertinent test results  History of Anesthesia Complications (+) PROLONGED EMERGENCE and history of anesthetic complications  Airway Mallampati: III       Dental  (+) Poor Dentition, Missing   Pulmonary neg sleep apnea, neg COPD,           Cardiovascular hypertension, Pt. on medications + Peripheral Vascular Disease  (-) Past MI and (-) CHF (-) dysrhythmias (-) Valvular Problems/Murmurs     Neuro/Psych neg Seizures Depression S/p cerebral artery stenting TIA   GI/Hepatic Neg liver ROS, hiatal hernia, GERD  Medicated,  Endo/Other  neg diabetesHypothyroidism   Renal/GU Renal InsufficiencyRenal disease     Musculoskeletal   Abdominal   Peds  Hematology  (+) anemia ,   Anesthesia Other Findings   Reproductive/Obstetrics                            Anesthesia Physical Anesthesia Plan  ASA: III  Anesthesia Plan: Spinal   Post-op Pain Management:    Induction:   PONV Risk Score and Plan:   Airway Management Planned:   Additional Equipment:   Intra-op Plan:   Post-operative Plan:   Informed Consent: I have reviewed the patients History and Physical, chart, labs and discussed the procedure including the risks, benefits and alternatives for the proposed anesthesia with the patient or authorized representative who has indicated his/her understanding and acceptance.     Plan Discussed with:   Anesthesia Plan Comments:         Anesthesia Quick Evaluation

## 2017-11-08 NOTE — Op Note (Signed)
11/08/2017  6:34 PM  PATIENT:  Jodi Colon  84 y.o. female  PRE-OPERATIVE DIAGNOSIS:  PRIMARY LOCALIZED OSTEOARTHRITIS OF LEFT HIP  POST-OPERATIVE DIAGNOSIS:  PRIMARY LOCALIZED OSTEOARTHRITIS OF LEFT HIP  PROCEDURE:  Procedure(s): TOTAL HIP ARTHROPLASTY ANTERIOR APPROACH (Left)  SURGEON: Laurene Footman, MD  ASSISTANTS: none  ANESTHESIA:   spinal  EBL:  Total I/O In: 600 [I.V.:600] Out: 500 [Blood:500]  BLOOD ADMINISTERED:none  DRAINS: none   LOCAL MEDICATIONS USED:  MARCAINE     SPECIMEN:  Source of Specimen:  Left femoral head  DISPOSITION OF SPECIMEN:  PATHOLOGY  COUNTS:  YES  TOURNIQUET:  * No tourniquets in log *  IMPLANTS: Medacta AMIS standard size 0 with 48 mm Mpact TM cup and liner with metal S 28 mm head  DICTATION: .Dragon Dictation   The patient was brought to the operating room and after spinal anesthesia was obtained patient was placed on the operative table with the ipsilateral foot into the Medacta attachment, contralateral leg on a well-padded table. C-arm was brought in and preop template x-ray taken. After prepping and draping in usual sterile fashion appropriate patient identification and timeout procedures were completed. Anterior approach to the hip was obtained and centered over the greater trochanter and TFL muscle. The subcutaneous tissue was incised hemostasis being achieved by electrocautery. TFL fascia was incised and the muscle retracted laterally deep retractor placed. The lateral femoral circumflex vessels were identified and ligated. The anterior capsule was exposed and a capsulotomy performed. The neck was identified and a femoral neck cut carried out with a saw. The head was removed without difficulty and showed sclerotic femoral head and acetabulum. Reaming was carried out to 48 mm and a 48 mm cup trial gave appropriate tightness to the acetabular component a 48 DM cup was impacted into position. The leg was then externally rotated and  ischiofemoral and pubofemoral releases carried out. The femur was sequentially broached to a size 0, size 0 broach with standard offset and S head trials were placed and the final components chosen. The 0 standard stem was inserted along with a metal S 28 mm head and 48 mm liner. The hip was reduced and was stable the wound was thoroughly irrigated with fibrillar placed along the posterior capsulotomy and medial neck. The deep fascia closed using a heavy Quill after infiltration of 30 cc of quarter percent Sensorcaine with epinephrine. 3-0 v-loc to close the skin with skin staples and incisional wound VAC applied  PLAN OF CARE: Admit to inpatient

## 2017-11-09 ENCOUNTER — Encounter: Payer: Self-pay | Admitting: Orthopedic Surgery

## 2017-11-09 LAB — BASIC METABOLIC PANEL
Anion gap: 7 (ref 5–15)
BUN: 17 mg/dL (ref 6–20)
CO2: 24 mmol/L (ref 22–32)
Calcium: 8.2 mg/dL — ABNORMAL LOW (ref 8.9–10.3)
Chloride: 106 mmol/L (ref 101–111)
Creatinine, Ser: 0.72 mg/dL (ref 0.44–1.00)
GFR calc Af Amer: >60 mL/min (ref >60)
GFR calc non Af Amer: >60 mL/min (ref >60)
Glucose, Bld: 151 mg/dL — ABNORMAL HIGH (ref 65–99)
Potassium: 3.7 mmol/L (ref 3.5–5.1)
Sodium: 137 mmol/L (ref 135–145)

## 2017-11-09 LAB — CBC
HCT: 30.8 % — ABNORMAL LOW (ref 35.0–47.0)
Hemoglobin: 10.5 g/dL — ABNORMAL LOW (ref 12.0–16.0)
MCH: 31.2 pg (ref 26.0–34.0)
MCHC: 34.2 g/dL (ref 32.0–36.0)
MCV: 91.3 fL (ref 80.0–100.0)
Platelets: 151 10*3/uL (ref 150–440)
RBC: 3.38 MIL/uL — ABNORMAL LOW (ref 3.80–5.20)
RDW: 13.8 % (ref 11.5–14.5)
WBC: 7.1 10*3/uL (ref 3.6–11.0)

## 2017-11-09 MED ORDER — ASPIRIN 81 MG PO CHEW: 81.0000 mg | CHEWABLE_TABLET | Freq: Two times a day (BID) | ORAL | 0 refills | Status: DC

## 2017-11-09 MED ORDER — SENNOSIDES-DOCUSATE SODIUM 8.6-50 MG PO TABS: 1.0000 | ORAL_TABLET | Freq: Every evening | ORAL | 0 refills | Status: DC | PRN

## 2017-11-09 MED ORDER — HYDROCODONE-ACETAMINOPHEN 7.5-325 MG PO TABS: 1.0000 | ORAL_TABLET | ORAL | 0 refills | Status: DC | PRN

## 2017-11-09 NOTE — Care Management Note (Signed)
Case Management Note  Patient Details  Name: Jodi Colon MRN: [id number] Date of Birth: Apr 13, 1930  Subjective/Objective:   POD # 1 left total hip arthroplasty. Met with patient and and son and daughter at bedside to discuss discharge planning. Patient lives alone. Her children live close so they are able to come by and help her often. She has a cane, walker, and bsc. Offered a list of home health agencies. Referral to [facility] for HHPT.  Patient will discharge home on Asparin 81 mg BID.                 Action/Plan: [facility] for HHPT, No DME, ASA.  Expected Discharge Date:                  Expected Discharge Plan:  [place]  In-House Referral:     Discharge planning Services     Post Acute Care Choice:  Home Health Choice offered to:  Patient, Adult Children  DME Arranged:    DME Agency:     HH Arranged:  PT HH Agency:     Status of Service:  In process, will continue to follow  If discussed at Long Length of Stay Meetings, dates discussed:    Additional Comments:  [name], RN 11/09/2017, 2:13 PM

## 2017-11-09 NOTE — Discharge Summary (Addendum)
Physician Discharge Summary  Patient ID: Jodi Colon MRN: [id number] DOB/AGE: [birth year] 84 y.o.  Admit date: 11/08/2017 Discharge date: 11/11/2017 Admission Diagnoses:  PRIMARY LOCALIZED OSTEOARTHRITIS OF LEFT HIP   Discharge Diagnoses: Patient Active Problem List   Diagnosis Date Noted  . Status post total hip replacement, left 11/08/2017  . Umbilical hernia without obstruction and without gangrene 07/28/2016  . Restless leg 10/21/2015  . Urinary frequency 10/15/2015  . Dysuria 10/15/2015  . Bursitis, trochanteric 04/02/2015  . Trochanteric bursitis of right hip 04/02/2015  . Disease of thyroid gland 03/04/2015  . Acid reflux 03/04/2015  . Subarachnoid hemorrhage ([place]) 02/09/2015  . Degenerative arthritis of lumbar spine 01/29/2015  . DDD (degenerative disc disease), lumbar 01/29/2015  . Arthritis, degenerative 01/28/2014  . Infection with methicillin-resistant Staphylococcus aureus 01/28/2014  . BP (high blood pressure) 01/28/2014  . HLD (hyperlipidemia) 01/28/2014  . Carcinoma in situ, breast, ductal 01/28/2014  . Clinical depression 01/28/2014  . Aneurysm, cerebral 01/28/2014  . Intraductal carcinoma of breast 01/28/2014  . Lumbar canal stenosis 12/07/2013  . Neuritis or radiculitis due to rupture of lumbar intervertebral disc 12/07/2013  . Cramp in lower leg 06/19/2013  . Hemorrhoids that prolapse with straining, but retract spontaneously 01/24/2013  . Scalp lesion 11/27/2012  . Rectal prolapse 11/27/2012  . Hemorrhoid 08/29/2012  . Screening breast examination 07/12/2012  . FOM (frequency of micturition) 02/28/2012  . Urge incontinence 02/28/2012  . History of neoplasm of bladder 02/28/2012  . Excessive urination at night 02/28/2012  . Bladder CA in situ 02/28/2012  . History of methicillin resistant Staphylococcus aureus infection 12/30/2011  . H/O infectious disease 12/30/2011  . Cutaneous ulcer ([place]) 11/26/2011  . Skin ulcer ([place]) 11/26/2011  . Colon polyp  11/11/2011  . Absolute anemia 11/11/2011  . Back pain, chronic 11/01/2011  . Abscess, gluteal, right 10/10/2011  . Abscess of buttock 10/10/2011  . Degenerative joint disease   . Cerebrovascular disease   . MRSA (methicillin resistant Staphylococcus aureus)   . Hyperlipidemia LDL goal < 100 04/02/2011  . Bladder cancer ([place]) 10/29/2009  . Malignant neoplasm of urinary bladder ([place]) 10/29/2009    Past Medical History:  Diagnosis Date  . Adnexal cyst   . Arthritis   . Bladder cancer ([place]) 04/2015  . Brain aneurysm    small, followed with regular angiograms, Dr. Estanislado Pandy  . Breast cancer [facility]) 2007   DCIS-Right, s/p lumpectomy/xrt  . Cerebrovascular disease 2008   s/p PTCA/stent of MCA, Deveshwar  . Chronic kidney disease   . Complication of anesthesia    2003- trouble waking up after fracture surgery, no issues with anesthesia since  . DDD (degenerative disc disease), cervical   . Degenerative joint disease    [facility]  . GERD (gastroesophageal reflux disease)   . Headache   . History of hiatal hernia   . History of MRSA infection   . History of TIA (transient ischemic attack)   . Hx MRSA infection   . Hyperlipidemia   . Hypertension   . Hypothyroidism   . Incomplete bladder emptying   . 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 ([place])   . MRSA (methicillin resistant Staphylococcus aureus) 2008   perineal abscess  . Nocturia   . Pneumonia   . Radiation 2007   BREAST CA  . Spinal stenosis   . Stroke ([place])   . Thyroid disease   . Urge incontinence      Transfusion: none   Consultants (if  any):   Discharged Condition: Improved  Hospital Course: Jodi Colon is an 84 y.o. female who was admitted 11/08/2017 with a diagnosis of hip osteoarthritis and went to the operating room on 11/08/2017 and underwent the above named procedures.    Surgeries: Procedure(s): TOTAL HIP ARTHROPLASTY ANTERIOR APPROACH on 11/08/2017 Patient tolerated  the surgery well. Taken to PACU where she was stabilized and then transferred to the orthopedic floor.  Started on aspirin 81 mg q 12 hrs. Foot pumps applied bilaterally at 80 mm. Heels elevated on bed with rolled towels. No evidence of DVT. Negative [place]. Physical therapy started on day #1 for gait training and transfer. OT started day #1 for ADL and assisted devices.  Patient's foley was d/c on day #1. Patient's IV was d/c on day #2.  On post op day #3 patient was stable and ready for discharge to Home with HHPT.  Implants: Medacta AMIS standard size 0 with 48 mm Mpact TM cup and liner with metal S 28 mm head    She was given perioperative antibiotics:  Anti-infectives (From admission, onward)   Start     Dose/Rate Route Frequency Ordered Stop   11/08/17 2115  clindamycin (CLEOCIN) IVPB 600 mg     600 mg 100 mL/hr over 30 Minutes Intravenous Every 6 hours 11/08/17 2114 11/09/17 0410   11/08/17 1430  clindamycin (CLEOCIN) 900 MG/50ML IVPB    Note to Pharmacy:  Veryl Speak   : cabinet override      11/08/17 1430 11/08/17 1713   11/07/17 2315  clindamycin (CLEOCIN) IVPB 900 mg     900 mg 100 mL/hr over 30 Minutes Intravenous  Once 11/07/17 2301 11/08/17 1723    .  She was given sequential compression devices, early ambulation, and aspirin for DVT prophylaxis.  She benefited maximally from the hospital stay and there were no complications.    Recent vital signs:  Vitals:   11/09/17 1622 11/09/17 2000  BP: (!) 124/54 (!) 121/50  Pulse: 78 94  Resp: 16 16  Temp: 97.9 F (36.6 C) 99.6 F (37.6 C)  SpO2: 96% 97%    Recent laboratory studies:  Lab Results  Component Value Date   HGB 10.5 (L) 11/09/2017   HGB 12.8 09/10/2017   HGB 13.4 09/07/2017   Lab Results  Component Value Date   WBC 7.1 11/09/2017   PLT 151 11/09/2017   Lab Results  Component Value Date   INR 0.95 09/10/2017   Lab Results  Component Value Date   NA 137 11/09/2017   K 3.7 11/09/2017   CL  106 11/09/2017   CO2 24 11/09/2017   BUN 17 11/09/2017   CREATININE 0.72 11/09/2017   GLUCOSE 151 (H) 11/09/2017    Discharge Medications:   Allergies as of 11/09/2017      Reactions   Penicillins Rash   Has patient had a PCN reaction causing immediate rash, facial/tongue/throat swelling, SOB or lightheadedness with hypotension: Yes Has patient had a PCN reaction causing severe rash involving mucus membranes or skin necrosis: No Has patient had a PCN reaction that required hospitalization: No Has patient had a PCN reaction occurring within the last 10 years: No If all of the above answers are "NO", then may proceed with Cephalosporin use.   Sulfasalazine Swelling   "swelling of face"   Sulfa Antibiotics Rash      Medication List    STOP taking these medications   aspirin 81 MG tablet Replaced by:  aspirin 81  MG chewable tablet   HYDROcodone-acetaminophen 5-325 MG tablet Commonly known as:  NORCO/VICODIN Replaced by:  HYDROcodone-acetaminophen 7.5-325 MG tablet   meloxicam 7.5 MG tablet Commonly known as:  MOBIC     TAKE these medications   aspirin 81 MG chewable tablet Chew 1 tablet (81 mg total) by mouth 2 (two) times daily. Start taking on:  11/10/2017 Replaces:  aspirin 81 MG tablet   atorvastatin 20 MG tablet Commonly known as:  LIPITOR Take 20 mg by mouth every morning.   cetirizine 10 MG tablet Commonly known as:  ZYRTEC Take 10 mg by mouth daily as needed for allergies.   fluticasone 50 MCG/ACT nasal spray Commonly known as:  FLONASE Place 1 spray into both nostrils daily as needed for allergies or rhinitis.   gabapentin 300 MG capsule Commonly known as:  NEURONTIN Take 300 mg by mouth 2 (two) times daily.   HYDROcodone-acetaminophen 7.5-325 MG tablet Commonly known as:  NORCO Take 1-2 tablets by mouth every 4 (four) hours as needed for severe pain (pain score 7-10). Replaces:  HYDROcodone-acetaminophen 5-325 MG tablet   hydrocortisone 2.5 %  cream Apply 1 application topically daily as needed (rash).   levothyroxine 50 MCG tablet Commonly known as:  SYNTHROID, LEVOTHROID Take 50 mcg by mouth daily before breakfast.   multivitamin tablet Take 1 tablet by mouth daily.   nortriptyline 10 MG capsule Commonly known as:  PAMELOR Take 10 mg by mouth at bedtime.   oxybutynin 5 MG tablet Commonly known as:  DITROPAN Take 5 mg by mouth 2 (two) times daily.   PROTONIX 40 MG tablet Generic drug:  pantoprazole Take 40 mg by mouth 2 (two) times daily.   rOPINIRole 0.5 MG tablet Commonly known as:  REQUIP Take 0.5 mg by mouth at bedtime.   senna-docusate 8.6-50 MG tablet Commonly known as:  Senokot-S Take 1 tablet by mouth at bedtime as needed for mild constipation.   timolol 0.5 % ophthalmic solution Commonly known as:  TIMOPTIC Place 1 drop into both eyes daily.   tiZANidine 2 MG tablet Commonly known as:  ZANAFLEX Take 2 mg by mouth 2 (two) times daily.   venlafaxine XR 75 MG 24 hr capsule Commonly known as:  EFFEXOR-XR Take 75 mg by mouth daily with breakfast.       Diagnostic Studies: Dg Hip Operative Unilat W Or W/o Pelvis Left  Result Date: 11/08/2017 CLINICAL DATA:  Left anterior hip surgery EXAM: DG HIP (WITH OR WITHOUT PELVIS) 2-3V LEFT; OPERATIVE LEFT HIP WITH PELVIS These 2 exams became associated and I could not find a way to dissociate them. I will provide both dictations in this 1 report. COMPARISON:  None. FINDINGS: Fluoroscopic spot images: The first image provided obtained at 20:24 hours demonstrates prominent degeneration of the left hip from the frontal projection. The next image provided from 11/08/2017 demonstrates acetabular shell and the femoral rasp in place with reasonable alignment although clearly the implant head component is not yet in place. The third image obtained at 21:10 hours demonstrates the acetabular shell and left femoral component of the implant in expected alignment without  obvious complicating feature. However, the tip of the stem of the femoral component is not included on imaging. Dedicated radiographs of the left hip, three views: Left hip arthroplasty in place without periprosthetic fracture or immediate complicating feature. Expected alignment and appearance. Moderate degenerative arthropathy of the right hip. Aortic Atherosclerosis (ICD10-I70.0). Lower lumbar spondylosis and degenerative disc disease. IMPRESSION: Left hip prosthesis placement without  complicating feature. Electronically Signed   By: Van Clines M.D.   On: 11/08/2017 19:17   Dg Hip Unilat W Or W/o Pelvis 2-3 Views Left  Result Date: 11/08/2017 CLINICAL DATA:  Left anterior hip surgery EXAM: DG HIP (WITH OR WITHOUT PELVIS) 2-3V LEFT; OPERATIVE LEFT HIP WITH PELVIS These 2 exams became associated and I could not find a way to dissociate them. I will provide both dictations in this 1 report. COMPARISON:  None. FINDINGS: Fluoroscopic spot images: The first image provided obtained at 20:24 hours demonstrates prominent degeneration of the left hip from the frontal projection. The next image provided from 11/08/2017 demonstrates acetabular shell and the femoral rasp in place with reasonable alignment although clearly the implant head component is not yet in place. The third image obtained at 21:10 hours demonstrates the acetabular shell and left femoral component of the implant in expected alignment without obvious complicating feature. However, the tip of the stem of the femoral component is not included on imaging. Dedicated radiographs of the left hip, three views: Left hip arthroplasty in place without periprosthetic fracture or immediate complicating feature. Expected alignment and appearance. Moderate degenerative arthropathy of the right hip. Aortic Atherosclerosis (ICD10-I70.0). Lower lumbar spondylosis and degenerative disc disease. IMPRESSION: Left hip prosthesis placement without complicating  feature. Electronically Signed   By: Van Clines M.D.   On: 11/08/2017 19:17    Disposition:  Home with home health   Follow-up Information    Duanne Guess, PA-C Follow up in 2 week(s).   Specialties:  Orthopedic Surgery, Emergency Medicine Contact information: [place] [address] [phone]            Signed: Feliberto Gottron 11/09/2017, 10:23 PM

## 2017-11-09 NOTE — Progress Notes (Addendum)
Subjective: 1 Day Post-Op Procedure(s) (LRB): TOTAL HIP ARTHROPLASTY ANTERIOR APPROACH (Left) Patient reports pain as mild.   Patient is well, and has had no acute complaints or problems Denies any CP, SOB, ABD pain. We will start therapy today.    Objective: Vital signs in last 24 hours: Temp:  [96.8 F (36 C)-98.7 F (37.1 C)] 97.1 F (36.2 C) (06/12 0900) Pulse Rate:  [57-84] 84 (06/12 0900) Resp:  [13-20] 16 (06/12 0900) BP: (95-196)/(42-82) 104/58 (06/12 0900) SpO2:  [92 %-100 %] 92 % (06/12 0900) Weight:  [56 kg (123 lb 8 oz)] 56 kg (123 lb 8 oz) (06/11 1502)  Intake/Output from previous day: 06/11 0701 - 06/12 0700 In: 1952.1 [I.V.:1688.8; IV Piggyback:263.3] Out: 1250 [Urine:630; Emesis/NG output:120; Blood:500] Intake/Output this shift: No intake/output data recorded.  Recent Labs    11/09/17 0301  HGB 10.5*   Recent Labs    11/09/17 0301  WBC 7.1  RBC 3.38*  HCT 30.8*  PLT 151   Recent Labs    11/09/17 0301  NA 137  K 3.7  CL 106  CO2 24  BUN 17  CREATININE 0.72  GLUCOSE 151*  CALCIUM 8.2*   No results for input(s): LABPT, INR in the last 72 hours.  EXAM General - Patient is Alert, Appropriate and Oriented Extremity - Neurovascular intact Sensation intact distally Intact pulses distally Dorsiflexion/Plantar flexion intact No cellulitis present Compartment soft Dressing - dressing C/D/I and no drainage, wound VAC is intact with no drainage Motor Function - intact, moving foot and toes well on exam.   Past Medical History:  Diagnosis Date  . Adnexal cyst   . Arthritis   . Bladder cancer ([place]) 04/2015  . Brain aneurysm    small, followed with regular angiograms, Dr. Estanislado Pandy  . Breast cancer [facility]) 2007   DCIS-Right, s/p lumpectomy/xrt  . Cerebrovascular disease 2008   s/p PTCA/stent of MCA, Deveshwar  . Chronic kidney disease   . Complication of anesthesia    2003- trouble waking up after fracture surgery, no issues with  anesthesia since  . DDD (degenerative disc disease), cervical   . Degenerative joint disease    [facility]  . GERD (gastroesophageal reflux disease)   . Headache   . History of hiatal hernia   . History of MRSA infection   . History of TIA (transient ischemic attack)   . Hx MRSA infection   . Hyperlipidemia   . Hypertension   . Hypothyroidism   . Incomplete bladder emptying   . 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 ([place])   . MRSA (methicillin resistant Staphylococcus aureus) 2008   perineal abscess  . Nocturia   . Pneumonia   . Radiation 2007   BREAST CA  . Spinal stenosis   . Stroke ([place])   . Thyroid disease   . Urge incontinence     Assessment/Plan:   1 Day Post-Op Procedure(s) (LRB): TOTAL HIP ARTHROPLASTY ANTERIOR APPROACH (Left) Active Problems:   Status post total hip replacement, left  Estimated body mass index is 26.73 kg/m as calculated from the following:   Height as of this encounter: 4\' 9"  (1.448 m).   Weight as of this encounter: 56 kg (123 lb 8 oz). Advance diet Up with therapy  Needs bowel movement Labs and vital signs are stable, recheck labs in the morning Pain well controlled Care management to assist with discharge  DVT Prophylaxis - Aspirin 81 mg BID Weight-Bearing as tolerated to  left leg   T. Rachelle Hora, PA-C [place] 11/09/2017, 9:08 AM

## 2017-11-09 NOTE — Evaluation (Signed)
Physical Therapy Evaluation Patient Details Name: Jodi Colon MRN: [id number] DOB: 1930/05/19 Today's Date: 11/09/2017   History of Present Illness  admitted for acute hospitalization status post L THR, anterior approach, 11/07/17; WBAT.  Clinical Impression  Upon evaluation, patient alert and oriented; follows commands and demonstrates good effort with all mobility tasks.  Speech slightly slurred, but corrected with cuing for articulation/ennunciation.  Vitals assessed-generally hypotensive (80/40s).  Responds positively to therex and activity (improved to 100s/50s); asymptomatic and stable with transition to upright.  Able to complete bed mobility with min assist; sit/stand, basic transfers and gait (10' forward/backward) with RW, min assist.  3-point, step to gait pattern with fair control and stance time/WBing L LE; minimal increase in pain reported.  Will continue to assess/progress mobility in subsequent sessions as medically appropriate. Would benefit from skilled PT to address above deficits and promote optimal return to PLOF; Recommend transition to [place] upon discharge from acute hospitalization.  May benefit from initial 24/7 sup/assist upon discharge     Follow Up Recommendations Home health PT    Equipment Recommendations  (has personal RW)    Recommendations for Other Services       Precautions / Restrictions Precautions Precautions: Fall Restrictions Weight Bearing Restrictions: Yes LLE Weight Bearing: Weight bearing as tolerated      Mobility  Bed Mobility Overal bed mobility: Needs Assistance Bed Mobility: Supine to Sit     Supine to sit: Min assist        Transfers Overall transfer level: Needs assistance Equipment used: Rolling walker (2 wheeled) Transfers: Sit to/from Stand Sit to Stand: Min guard;Min assist         General transfer comment: fair/good use and WBing L LE; min cuing for hand placement.  Asymptomatic with transition to  upright.  Ambulation/Gait Ambulation/Gait assistance: Min assist Ambulation Distance (Feet): 10 Feet(forward/backward) Assistive device: Rolling walker (2 wheeled)       General Gait Details: 3-point, step to gait pattern with fair stance time/weight acceptance L LE.  Min cuing for walker use (encouraged to push vs lift). No buckling or LOB; denies dizziness or lightheadedness.   Stairs            Wheelchair Mobility    Modified Rankin (Stroke Patients Only)       Balance Overall balance assessment: Needs assistance Sitting-balance support: No upper extremity supported;Feet supported Sitting balance-Leahy Scale: Good     Standing balance support: Bilateral upper extremity supported Standing balance-Leahy Scale: Fair                               Pertinent Vitals/Pain Pain Assessment: Faces Faces Pain Scale: Hurts even more Pain Location: L hip Pain Descriptors / Indicators: Aching;Guarding;Grimacing Pain Intervention(s): Repositioned;Limited activity within patient's tolerance;Monitored during session;Premedicated before session    [place] expects to be discharged to:: Private residence Living Arrangements: Alone Available Help at Discharge: Family;Available PRN/intermittently(children provide daily check in as needed) Type of Home: House Home Access: Stairs to enter   [employer] of Steps: 1 Home Layout: One level Home Equipment: Cane - single point      Prior Function Level of Independence: Independent with assistive device(s)         Comments: Indep with SPC for ADLs, household and community mobilization; + driving.     Hand Dominance        Extremity/Trunk Assessment   Upper Extremity Assessment Upper Extremity Assessment: Overall [facility]  for tasks assessed    Lower Extremity Assessment Lower Extremity Assessment: Generalized weakness(L hip grossly 3-/5, limited by pain; otherwise grossly WFL  throughout.  Good active control noted with isolated activities)       Communication   Communication: (speech mildly slurred; improved wtih cuing for articulation)  Cognition Arousal/Alertness: Awake/alert Behavior During Therapy: WFL for tasks assessed/performed Overall Cognitive Status: Within Functional Limits for tasks assessed                                        General Comments      Exercises Other Exercises Other Exercises: Supine L LE therex, 1x10, act assist ROM: ankle pumps, quad sets, SAQs, iso hip adduct, heel slides, hip abduct/adduct.  Constant cuing for alertness and attention to task, frequently closing eyes during session.   Assessment/Plan    PT Assessment Patient needs continued PT services  PT Problem List Decreased strength;Decreased range of motion;Decreased balance;Decreased activity tolerance;Decreased mobility;Decreased coordination;Decreased cognition;Decreased knowledge of use of DME;Decreased safety awareness;Decreased knowledge of precautions;Decreased skin integrity;Pain       PT Treatment Interventions DME instruction;Gait training;Stair training;Functional mobility training;Therapeutic activities;Therapeutic exercise;Balance training;Patient/family education    PT Goals (Current goals can be found in the Care Plan section)  Acute Rehab PT Goals Patient Stated Goal: to try to move around PT Goal Formulation: With patient Time For Goal Achievement: 11/23/17 Potential to Achieve Goals: Good    Frequency BID   Barriers to discharge Decreased caregiver support      Co-evaluation               AM-PAC PT "6 Clicks" Daily Activity  Outcome Measure Difficulty turning over in bed (including adjusting bedclothes, sheets and blankets)?: Unable Difficulty moving from lying on back to sitting on the side of the bed? : Unable Difficulty sitting down on and standing up from a chair with arms (e.g., wheelchair, bedside commode,  etc,.)?: Unable Help needed moving to and from a bed to chair (including a wheelchair)?: A Little Help needed walking in hospital room?: A Little Help needed climbing 3-5 steps with a railing? : A Lot 6 Click Score: 11    End of Session Equipment Utilized During Treatment: Gait belt Activity Tolerance: Patient tolerated treatment well Patient left: in chair;with call bell/phone within reach;with chair alarm set Nurse Communication: Mobility status PT Visit Diagnosis: Muscle weakness (generalized) (M62.81);Difficulty in walking, not elsewhere classified (R26.2);Pain Pain - Right/Left: Left Pain - part of body: Hip    Time: 1015-1059 PT Time Calculation (min) (ACUTE ONLY): 44 min   Charges:   PT Evaluation $PT Eval Moderate Complexity: 1 Mod PT Treatments $Therapeutic Exercise: 8-22 mins $Therapeutic Activity: 8-22 mins   PT G Codes:       Evaluation/treatment session was lead and completed by Roxan Hockey, SPT; directly observed, guided and documented by Tera Helper, PT.  Reyes Ivan. Owens Shark, PT, DPT, NCS 11/09/17, 12:24 PM [phone]

## 2017-11-09 NOTE — Progress Notes (Signed)
Physical Therapy Treatment Patient Details Name: Jodi Colon MRN: [id number] DOB: [birth year] Today's Date: 11/09/2017    History of Present Illness admitted for acute hospitalization status post L THR, anterior approach, 11/07/17; WBAT.    PT Comments    Vitals stable and WFL throughout session; no reports of dizziness or lightheadedness with mobility.  Able to increase gait distance (110') with RW, cga/min assist.  Slow and guarded, but no overt buckling or LOB.  Fair/good pain control; good carry-over of cuing/education from AM session.  Very motivated to participate/progress as able.    Follow Up Recommendations  Home health PT     Equipment Recommendations       Recommendations for Other Services       Precautions / Restrictions Precautions Precautions: Fall Restrictions Weight Bearing Restrictions: Yes LLE Weight Bearing: Weight bearing as tolerated    Mobility  Bed Mobility Overal bed mobility: Needs Assistance Bed Mobility: Sit to Supine       Sit to supine: Min assist      Transfers Overall transfer level: Needs assistance Equipment used: Rolling walker (2 wheeled) Transfers: Sit to/from Stand Sit to Stand: Min guard;Min assist         General transfer comment: fair/good carry-over of technique  Ambulation/Gait Ambulation/Gait assistance: Min guard Ambulation Distance (Feet): 110 Feet Assistive device: Rolling walker (2 wheeled)       General Gait Details: slow, but fairly steady, with decreased cadence; indep advances bilat LEs.  No buckling, LOB   Stairs             Wheelchair Mobility    Modified Rankin (Stroke Patients Only)       Balance Overall balance assessment: Needs assistance Sitting-balance support: No upper extremity supported;Feet supported Sitting balance-Leahy Scale: Good     Standing balance support: Bilateral upper extremity supported Standing balance-Leahy Scale: Fair                              Cognition Arousal/Alertness: Awake/alert Behavior During Therapy: WFL for tasks assessed/performed Overall Cognitive Status: Within Functional Limits for tasks assessed                                        Exercises      General Comments        Pertinent Vitals/Pain Pain Assessment: Faces Faces Pain Scale: Hurts little more Pain Location: L hip Pain Descriptors / Indicators: Aching;Guarding;Grimacing Pain Intervention(s): Limited activity within patient's tolerance;Monitored during session;Repositioned    Home Living                      Prior Function            PT Goals (current goals can now be found in the care plan section) Acute Rehab PT Goals Patient Stated Goal: to try to move around PT Goal Formulation: With patient Time For Goal Achievement: 11/23/17 Potential to Achieve Goals: Good    Frequency    BID      PT Plan Current plan remains appropriate    Co-evaluation              AM-PAC PT "6 Clicks" Daily Activity  Outcome Measure  Difficulty turning over in bed (including adjusting bedclothes, sheets and blankets)?: Unable Difficulty moving from lying on back to sitting on the side of  the bed? : Unable Difficulty sitting down on and standing up from a chair with arms (e.g., wheelchair, bedside commode, etc,.)?: Unable Help needed moving to and from a bed to chair (including a wheelchair)?: A Little Help needed walking in hospital room?: A Little Help needed climbing 3-5 steps with a railing? : A Little 6 Click Score: 12    End of Session Equipment Utilized During Treatment: Gait belt Activity Tolerance: Patient tolerated treatment well Patient left: in bed;with call bell/phone within reach;with bed alarm set Nurse Communication: Mobility status PT Visit Diagnosis: Muscle weakness (generalized) (M62.81);Difficulty in walking, not elsewhere classified (R26.2);Pain Pain - Right/Left: Left Pain - part of body:  Hip     Time: 4497-5300 PT Time Calculation (min) (ACUTE ONLY): 23 min  Charges:  $Gait Training: 8-22 mins $Therapeutic Activity: 8-22 mins                    G Codes:       Evaluation/treatment session was lead and completed by Roxan Hockey, SPT; directly observed, guided and documented by Tera Helper, [place]. Owens Shark, PT, DPT, NCS 11/09/17, 4:12 PM [phone]

## 2017-11-09 NOTE — Anesthesia Postprocedure Evaluation (Signed)
Anesthesia Post Note  Patient: Jodi Colon  Procedure(s) Performed: TOTAL HIP ARTHROPLASTY ANTERIOR APPROACH (Left Hip)  Patient location during evaluation: Nursing Unit Anesthesia Type: Spinal Level of consciousness: awake and alert and oriented Pain management: satisfactory to patient Vital Signs Assessment: post-procedure vital signs reviewed and stable Respiratory status: respiratory function stable Cardiovascular status: stable Postop Assessment: no headache, no backache, spinal receding, patient able to bend at knees, no apparent nausea or vomiting, adequate PO intake and able to ambulate Anesthetic complications: no     Last Vitals:  Vitals:   11/09/17 0143 11/09/17 0300  BP: (!) 95/54 (!) 104/42  Pulse: 73 79  Resp: 19 18  Temp:  36.7 C  SpO2: 95% 94%    Last Pain:  Vitals:   11/09/17 0514  TempSrc:   PainSc: Ambrose Mantle

## 2017-11-09 NOTE — Clinical Social Work Note (Signed)
CSW received referral for SNF.  Case discussed with case manager and plan is to discharge home with home health.  CSW to sign off please re-consult if social work needs arise.  Joy Reiger R. Temesha Queener, MSW, LCSWA [phone]  

## 2017-11-10 LAB — CBC
HCT: 29.7 % — ABNORMAL LOW (ref 35.0–47.0)
Hemoglobin: 10.1 g/dL — ABNORMAL LOW (ref 12.0–16.0)
MCH: 31.4 pg (ref 26.0–34.0)
MCHC: 33.9 g/dL (ref 32.0–36.0)
MCV: 92.8 fL (ref 80.0–100.0)
Platelets: 136 10*3/uL — ABNORMAL LOW (ref 150–440)
RBC: 3.2 MIL/uL — ABNORMAL LOW (ref 3.80–5.20)
RDW: 13.9 % (ref 11.5–14.5)
WBC: 7.7 10*3/uL (ref 3.6–11.0)

## 2017-11-10 LAB — BASIC METABOLIC PANEL
Anion gap: 7 (ref 5–15)
BUN: 20 mg/dL (ref 6–20)
CO2: 25 mmol/L (ref 22–32)
Calcium: 8.3 mg/dL — ABNORMAL LOW (ref 8.9–10.3)
Chloride: 102 mmol/L (ref 101–111)
Creatinine, Ser: 0.67 mg/dL (ref 0.44–1.00)
GFR calc Af Amer: >60 mL/min (ref >60)
GFR calc non Af Amer: >60 mL/min (ref >60)
Glucose, Bld: 120 mg/dL — ABNORMAL HIGH (ref 65–99)
Potassium: 3.9 mmol/L (ref 3.5–5.1)
Sodium: 134 mmol/L — ABNORMAL LOW (ref 135–145)

## 2017-11-10 LAB — SURGICAL PATHOLOGY

## 2017-11-10 NOTE — Progress Notes (Signed)
Assessment done. Awake in bed. Denies pain. Call bell in reach, instructed to call for needs.

## 2017-11-10 NOTE — Progress Notes (Signed)
Subjective: 2 Days Post-Op Procedure(s) (LRB): TOTAL HIP ARTHROPLASTY ANTERIOR APPROACH (Left) Patient reports pain as moderate.   Patient is well, and has had no acute complaints or problems Denies any CP, SOB, ABD pain. We will continue therapy today.    Objective: Vital signs in last 24 hours: Temp:  [97.1 F (36.2 C)-99.6 F (37.6 C)] 99.5 F (37.5 C) (06/13 0737) Pulse Rate:  [78-110] 110 (06/13 0737) Resp:  [16] 16 (06/12 2000) BP: (104-124)/(50-58) 124/55 (06/13 0737) SpO2:  [92 %-97 %] 92 % (06/13 0737)  Intake/Output from previous day: 06/12 0701 - 06/13 0700 In: 200 [P.O.:200] Out: -  Intake/Output this shift: No intake/output data recorded.  Recent Labs    11/09/17 0301 11/10/17 0328  HGB 10.5* 10.1*   Recent Labs    11/09/17 0301 11/10/17 0328  WBC 7.1 7.7  RBC 3.38* 3.20*  HCT 30.8* 29.7*  PLT 151 136*   Recent Labs    11/09/17 0301 11/10/17 0328  NA 137 134*  K 3.7 3.9  CL 106 102  CO2 24 25  BUN 17 20  CREATININE 0.72 0.67  GLUCOSE 151* 120*  CALCIUM 8.2* 8.3*   No results for input(s): LABPT, INR in the last 72 hours.  EXAM General - Patient is Alert, Appropriate and Oriented Extremity - Neurovascular intact Sensation intact distally Intact pulses distally Dorsiflexion/Plantar flexion intact No cellulitis present Compartment soft Dressing - dressing C/D/I and no drainage, wound VAC is intact with scant drainage Motor Function - intact, moving foot and toes well on exam.   Past Medical History:  Diagnosis Date  . Adnexal cyst   . Arthritis   . Bladder cancer ([place]) 04/2015  . Brain aneurysm    small, followed with regular angiograms, Dr. Estanislado Pandy  . Breast cancer [facility]) 2007   DCIS-Right, s/p lumpectomy/xrt  . Cerebrovascular disease 2008   s/p PTCA/stent of MCA, Deveshwar  . Chronic kidney disease   . Complication of anesthesia    2003- trouble waking up after fracture surgery, no issues with anesthesia since  . DDD  (degenerative disc disease), cervical   . Degenerative joint disease    [facility]  . GERD (gastroesophageal reflux disease)   . Headache   . History of hiatal hernia   . History of MRSA infection   . History of TIA (transient ischemic attack)   . Hx MRSA infection   . Hyperlipidemia   . Hypertension   . Hypothyroidism   . Incomplete bladder emptying   . 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 MRSA (methicillin resistant Staphylococcus aureus) 2008   perineal abscess  . Nocturia   . Pneumonia   . Radiation 2007   BREAST CA  . Spinal stenosis   . Stroke ([place])   . Thyroid disease   . Urge incontinence     Assessment/Plan:   2 Days Post-Op Procedure(s) (LRB): TOTAL HIP ARTHROPLASTY ANTERIOR APPROACH (Left) Active Problems:   Status post total hip replacement, left  Estimated body mass index is 26.73 kg/m as calculated from the following:   Height as of this encounter: 4\' 9"  (1.448 m).   Weight as of this encounter: 56 kg (123 lb 8 oz). Advance diet Up with therapy  Patient tachycardic at 110.  This could be pain induced.  She denies any chest pain, shortness of breath.  No lower extremity discomfort.  Patient is due pain medication, will continue to monitor her vital signs  and see how she does with therapy today. If vital signs are stable and patient progresses well with PT she can be discharged home with home health PT today.  Care management to assist with discharge to home with HHPT  DVT Prophylaxis - Aspirin 81 mg BID Weight-Bearing as tolerated to left leg   T. Rachelle Hora, PA-C [place] 11/10/2017, 8:06 AM

## 2017-11-10 NOTE — Progress Notes (Signed)
Physical Therapy Treatment Patient Details Name: Jodi Colon MRN: [id number] DOB: Dec 09, 1929 Today's Date: 11/10/2017    History of Present Illness admitted for acute hospitalization status post L THR, anterior approach, 11/07/17; WBAT.    PT Comments    Able to complete full lap around nursing station this AM, no greater than cga level of assist required.  Reciprocal stepping pattern with continuous gait cycle, fair/good WBing L LE throughout.  Did initiate stair negotiation to prepare for discharge-cga over single step with bilat rails, mod assist over platform step with RW.  Patient very fearful of stair negotiation with RW, prefers to complete with bilat rails (and allow family to manage RW for her once on/off stairs). Does plan to have family stay with, provide support/assist as needed upon discharge.    Follow Up Recommendations  Home health PT     Equipment Recommendations       Recommendations for Other Services       Precautions / Restrictions Precautions Precautions: Fall Restrictions Weight Bearing Restrictions: Yes LLE Weight Bearing: Weight bearing as tolerated    Mobility  Bed Mobility Overal bed mobility: Needs Assistance Bed Mobility: Supine to Sit     Supine to sit: Min guard        Transfers Overall transfer level: Needs assistance Equipment used: Rolling walker (2 wheeled) Transfers: Sit to/from Stand Sit to Stand: Min guard;Supervision            Ambulation/Gait Ambulation/Gait assistance: Min guard Gait Distance (Feet): 180 Feet Assistive device: Rolling walker (2 wheeled)   Gait velocity: 10' walk time, 15 seconds   General Gait Details: reciprocal stepping pattern with fari step height/length; slow and guarded, but no buckling or LOB.  Optimal performance with cuing for continuous stepping pattern.   Stairs Stairs: Yes Stairs assistance: Mod assist   Number of Stairs: 1 General stair comments: up/down single platform step,  mod assist with RW; very fearful of descent.  Prefers single step with bilat rails (has this set up at home; able to perform with cga) and family member to manage RW for patient.     Wheelchair Mobility    Modified Rankin (Stroke Patients Only)       Balance Overall balance assessment: Needs assistance Sitting-balance support: No upper extremity supported;Feet supported Sitting balance-Leahy Scale: Good     Standing balance support: Bilateral upper extremity supported Standing balance-Leahy Scale: Fair                              Cognition Arousal/Alertness: Awake/alert Behavior During Therapy: WFL for tasks assessed/performed Overall Cognitive Status: Within Functional Limits for tasks assessed                                        Exercises      General Comments        Pertinent Vitals/Pain Pain Assessment: Faces Faces Pain Scale: Hurts little more Pain Location: L hip Pain Descriptors / Indicators: Aching;Guarding;Grimacing Pain Intervention(s): Limited activity within patient's tolerance;Monitored during session;Premedicated before session;Repositioned    Home Living                      Prior Function            PT Goals (current goals can now be found in the care plan section) Acute Rehab  PT Goals Patient Stated Goal: to try to move around PT Goal Formulation: With patient Time For Goal Achievement: 11/23/17 Potential to Achieve Goals: Good Progress towards PT goals: Progressing toward goals    Frequency    BID      PT Plan Current plan remains appropriate    Co-evaluation              AM-PAC PT "6 Clicks" Daily Activity  Outcome Measure  Difficulty turning over in bed (including adjusting bedclothes, sheets and blankets)?: A Little Difficulty moving from lying on back to sitting on the side of the bed? : A Little Difficulty sitting down on and standing up from a chair with arms (e.g.,  wheelchair, bedside commode, etc,.)?: A Little Help needed moving to and from a bed to chair (including a wheelchair)?: A Little Help needed walking in hospital room?: A Little Help needed climbing 3-5 steps with a railing? : A Little 6 Click Score: 18    End of Session Equipment Utilized During Treatment: Gait belt Activity Tolerance: Patient tolerated treatment well Patient left: in chair;with call bell/phone within reach;with chair alarm set Nurse Communication: Mobility status PT Visit Diagnosis: Muscle weakness (generalized) (M62.81);Difficulty in walking, not elsewhere classified (R26.2);Pain Pain - Right/Left: Left Pain - part of body: Hip     Time: 0258-5277 PT Time Calculation (min) (ACUTE ONLY): 53 min  Charges:  $Gait Training: 23-37 mins $Therapeutic Activity: 23-37 mins                    G Codes:      Evaluation/treatment session was lead and completed by Roxan Hockey, SPT; directly observed, guided and documented by Tera Helper, [place]. Owens Shark, PT, DPT, NCS 11/10/17, 10:48 AM [phone]

## 2017-11-10 NOTE — Progress Notes (Signed)
Sleeping without distress. Call bell in reach.

## 2017-11-10 NOTE — Progress Notes (Signed)
Physical Therapy Treatment Patient Details Name: Jodi Colon MRN: [id number] DOB: May 16, 1930 Today's Date: 11/10/2017    History of Present Illness admitted for acute hospitalization status post L THR, anterior approach, 11/07/17; WBAT.    PT Comments    Family present for training/education regarding safe/appropriate guarding techniques and assist for basic transfers, gait, stairs and car transfers.  With verbal education and demonstration from therapist, family able to return demonstration of safe and appropriate technique withi good handling, cuing and communication noted between patient and family.  All voice comfort level with current functional performance and level of assist required.    Follow Up Recommendations  Home health PT     Equipment Recommendations       Recommendations for Other Services       Precautions / Restrictions Precautions Precautions: Fall Restrictions Weight Bearing Restrictions: Yes LLE Weight Bearing: Weight bearing as tolerated    Mobility  Bed Mobility Overal bed mobility: Needs Assistance Bed Mobility: Supine to Sit;Sit to Supine     Supine to sit: Supervision Sit to supine: Supervision      Transfers Overall transfer level: Needs assistance Equipment used: Rolling walker (2 wheeled) Transfers: Sit to/from Stand Sit to Stand: Min guard         General transfer comment: assist for anterior weight translation with initial sit/stand  Ambulation/Gait Ambulation/Gait assistance: Min guard;Supervision Gait Distance (Feet): 100 Feet Assistive device: Rolling walker (2 wheeled)       General Gait Details: reciprocal stepping, good step height/length bilat. Mildly antalgic to L at times   Stairs Stairs: Yes Stairs assistance: Min guard;Min assist Stair Management: Two rails Number of Stairs: 2 General stair comments: up/down with bilat rails, cga/min assist.  Min cuing for recall of technique   Wheelchair Mobility     Modified Rankin (Stroke Patients Only)       Balance Overall balance assessment: Needs assistance Sitting-balance support: No upper extremity supported;Feet supported Sitting balance-Leahy Scale: Good     Standing balance support: Bilateral upper extremity supported Standing balance-Leahy Scale: Fair                              Cognition Arousal/Alertness: Awake/alert Behavior During Therapy: WFL for tasks assessed/performed Overall Cognitive Status: Within Functional Limits for tasks assessed                                        Exercises Other Exercises Other Exercises: Toilet transfer, ambulatory with RW, cga/min assist.  Sit/stand from [facility] with RW, cga/min assist; static standing balance for peri-care and clothing management with RW, cga/min assist. Other Exercises: Family present for training/education regarding safe/appropriate guarding techniques and assist for basic transfers, gait, stairs and car transfers.  With verbal education and demonstration from therapist, family able to return demonstration of safe and appropriate technique withi good handling, cuing and communication noted between patient and family.  All voice comfort level with current functional performance and level of assist required.    General Comments        Pertinent Vitals/Pain Pain Assessment: 0-10 Pain Score: 4  Pain Location: L hip Pain Descriptors / Indicators: Aching;Guarding;Grimacing Pain Intervention(s): Monitored during session;Repositioned;Limited activity within patient's tolerance    Home Living Family/patient expects to be discharged to:: Private residence Living Arrangements: Alone Available Help at Discharge: Family;Available PRN/intermittently Type of Home: House  Home Access: Stairs to enter            Prior Function            PT Goals (current goals can now be found in the care plan section) Acute Rehab PT Goals Patient Stated Goal:  to try to move around PT Goal Formulation: With patient Time For Goal Achievement: 11/23/17 Potential to Achieve Goals: Good Progress towards PT goals: Progressing toward goals    Frequency    BID      PT Plan Current plan remains appropriate    Co-evaluation              AM-PAC PT "6 Clicks" Daily Activity  Outcome Measure  Difficulty turning over in bed (including adjusting bedclothes, sheets and blankets)?: A Little Difficulty moving from lying on back to sitting on the side of the bed? : A Little Difficulty sitting down on and standing up from a chair with arms (e.g., wheelchair, bedside commode, etc,.)?: A Little Help needed moving to and from a bed to chair (including a wheelchair)?: A Little Help needed walking in hospital room?: A Little Help needed climbing 3-5 steps with a railing? : A Little 6 Click Score: 18    End of Session Equipment Utilized During Treatment: Gait belt Activity Tolerance: Patient tolerated treatment well Patient left: in bed;with call bell/phone within reach;with bed alarm set;with family/visitor present Nurse Communication: Mobility status PT Visit Diagnosis: Muscle weakness (generalized) (M62.81);Difficulty in walking, not elsewhere classified (R26.2);Pain Pain - Right/Left: Left Pain - part of body: Hip     Time: 3016-0109 PT Time Calculation (min) (ACUTE ONLY): 53 min  Charges:  $Gait Training: 23-37 mins $Therapeutic Activity: 23-37 mins                    G Codes:       Evaluation/treatment session was lead and completed by Roxan Hockey, SPT; directly observed, guided and documented by Tera Helper, [place]. Owens Shark, PT, DPT, NCS 11/10/17, 5:14 PM [phone]

## 2017-11-11 NOTE — Progress Notes (Signed)
Physical Therapy Treatment Patient Details Name: Jodi Colon MRN: [id number] DOB: 01/03/1930 Today's Date: 11/11/2017    History of Present Illness admitted for acute hospitalization status post L THR, anterior approach, 11/07/17; WBAT.    PT Comments    Pt lethargic upon arrival however is agreeable to work with PT. Pt requires min guarding sit<>supine with B UE support on bed rail. Pt transfers sit<>stand with min guarding and verbal cuing safely with no gross LOB. Pt instructed in LE strengthening exercises as well as standing balance exercises reaching outside BOS to perform functional tasks. Pt amb 200' with one seated rest break using RW and min assist. Pt states that she requires rest break for pain and fatigue. Instructed and practiced with care givers in safety while guarding pt during amb, simulated car transfers as well as bed mobility. Extended time spent in pt room answering caregiver questions to assure comfort with caregiver providing care at home. Pt could benefit from continued skilled therapy to address deficits with strength, ROM, and mobility, however pt has displayed ability to function safely in home enviornment. D/c recommendation at this time continues to be home health PT.   Follow Up Recommendations  Home health PT     Equipment Recommendations       Recommendations for Other Services       Precautions / Restrictions Precautions Precautions: Fall Restrictions Weight Bearing Restrictions: Yes LLE Weight Bearing: Weight bearing as tolerated    Mobility  Bed Mobility Overal bed mobility: Modified Independent Bed Mobility: Supine to Sit     Supine to sit: Min guard(B UE required on bed rail)     General bed mobility comments: pt required verbal and tactile cuing for sequencing before being able to perform supine>sit with minimal guarding  Transfers Overall transfer level: Needs assistance Equipment used: Rolling walker (2 wheeled) Transfers: Sit  to/from Stand Sit to Stand: Min guard         General transfer comment: pt requires verbal cuing for sequencing however is able to perform sit<>stand with minimal guarding and no gross LOB  Ambulation/Gait Ambulation/Gait assistance: Min guard Gait Distance (Feet): 200 Feet Assistive device: Rolling walker (2 wheeled) Gait Pattern/deviations: Step-to pattern;Decreased step length - right;Decreased step length - left     General Gait Details: min cuing for increased step height/length, postural extension.  Good stance time/weight acceptance L LE. Pt require one seated rest break due to pain and fatigue. Pt quickly recovered when sitting.   Stairs         General stair comments: pt met stair goals prior to session   Wheelchair Mobility    Modified Rankin (Stroke Patients Only)       Balance Overall balance assessment: Needs assistance             Standing balance comment: requires min assist to maintain balance while standing unsupported and donning pants. able to stand unsupported while not reaching outside BOS.                            Cognition Arousal/Alertness: Awake/alert Behavior During Therapy: WFL for tasks assessed/performed Overall Cognitive Status: Within Functional Limits for tasks assessed                                        Exercises Other Exercises Other Exercises: Seated ther-ex x15 with min   guarding and verbal cuing x15 reps: LAQ, hip abd, standing balance exercise reaching outside BOS. Pt tolerated treatment exercise well. Other Exercises: gait training including verbal cuing for upright posture and RW use. Caregiver education provided while having caregiver assist pt while ambulating, performing simulated car transfers, simulated bed mobility.    General Comments        Pertinent Vitals/Pain Pain Assessment: 0-10 Pain Score: 0-No pain Pain Location: L hip Pain Descriptors / Indicators:  Aching;Guarding;Grimacing Pain Intervention(s): Limited activity within patient's tolerance;Monitored during session;Repositioned    Home Living                      Prior Function            PT Goals (current goals can now be found in the care plan section) Acute Rehab PT Goals Patient Stated Goal: to try to move around PT Goal Formulation: With patient Time For Goal Achievement: 11/23/17 Potential to Achieve Goals: Good Progress towards PT goals: Progressing toward goals    Frequency    BID      PT Plan Current plan remains appropriate    Co-evaluation              AM-PAC PT "6 Clicks" Daily Activity  Outcome Measure  Difficulty turning over in bed (including adjusting bedclothes, sheets and blankets)?: A Little Difficulty moving from lying on back to sitting on the side of the bed? : A Little Difficulty sitting down on and standing up from a chair with arms (e.g., wheelchair, bedside commode, etc,.)?: A Little Help needed moving to and from a bed to chair (including a wheelchair)?: A Little Help needed walking in hospital room?: A Little Help needed climbing 3-5 steps with a railing? : A Little 6 Click Score: 18    End of Session Equipment Utilized During Treatment: Gait belt Activity Tolerance: Patient tolerated treatment well Patient left: in bed;with call bell/phone within reach;with family/visitor present;with SCD's reapplied   PT Visit Diagnosis: Muscle weakness (generalized) (M62.81);Difficulty in walking, not elsewhere classified (R26.2);Pain Pain - Right/Left: Left Pain - part of body: Hip     Time: 1427-1517 PT Time Calculation (min) (ACUTE ONLY): 50 min  Charges:                       G Codes:        , SPT      11/11/2017, 3:25 PM   

## 2017-11-11 NOTE — Progress Notes (Signed)
Subjective: 3 Days Post-Op Procedure(s) (LRB): TOTAL HIP ARTHROPLASTY ANTERIOR APPROACH (Left) Patient reports pain as mild.   Patient is well, and has had no acute complaints or problems Denies any CP, SOB, ABD pain. We will continue therapy today.    Objective: Vital signs in last 24 hours: Temp:  [98.2 F (36.8 C)-99.5 F (37.5 C)] 99 F (37.2 C) (06/14 0719) Pulse Rate:  [68-104] 104 (06/14 0719) BP: (92-150)/(43-58) 150/58 (06/14 0719) SpO2:  [92 %-97 %] 92 % (06/14 0719)  Intake/Output from previous day: 06/13 0701 - 06/14 0700 In: 3473.8 [P.O.:580; I.V.:2893.8] Out: -  Intake/Output this shift: No intake/output data recorded.  Recent Labs    11/09/17 0301 11/10/17 0328  HGB 10.5* 10.1*   Recent Labs    11/09/17 0301 11/10/17 0328  WBC 7.1 7.7  RBC 3.38* 3.20*  HCT 30.8* 29.7*  PLT 151 136*   Recent Labs    11/09/17 0301 11/10/17 0328  NA 137 134*  K 3.7 3.9  CL 106 102  CO2 24 25  BUN 17 20  CREATININE 0.72 0.67  GLUCOSE 151* 120*  CALCIUM 8.2* 8.3*   No results for input(s): LABPT, INR in the last 72 hours.  EXAM General - Patient is Alert, Appropriate and Oriented Extremity - Neurovascular intact Sensation intact distally Intact pulses distally Dorsiflexion/Plantar flexion intact No cellulitis present Compartment soft  - homans sign Dressing - dressing C/D/I and no drainage, wound VAC is intact with scant drainage Motor Function - intact, moving foot and toes well on exam.   Past Medical History:  Diagnosis Date  . Adnexal cyst   . Arthritis   . Bladder cancer ([place]) 04/2015  . Brain aneurysm    small, followed with regular angiograms, Dr. Estanislado Pandy  . Breast cancer [facility]) 2007   DCIS-Right, s/p lumpectomy/xrt  . Cerebrovascular disease 2008   s/p PTCA/stent of MCA, Deveshwar  . Chronic kidney disease   . Complication of anesthesia    2003- trouble waking up after fracture surgery, no issues with anesthesia since  . DDD  (degenerative disc disease), cervical   . Degenerative joint disease    [facility] by [facility]  . GERD (gastroesophageal reflux disease)   . Headache   . History of hiatal hernia   . History of MRSA infection   . History of TIA (transient ischemic attack)   . Hx MRSA infection   . Hyperlipidemia   . Hypertension   . Hypothyroidism   . Incomplete bladder emptying   . 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 MRSA (methicillin resistant Staphylococcus aureus) 2008   perineal abscess  . Nocturia   . Pneumonia   . Radiation 2007   BREAST CA  . Spinal stenosis   . Stroke ([place])   . Thyroid disease   . Urge incontinence     Assessment/Plan:   3 Days Post-Op Procedure(s) (LRB): TOTAL HIP ARTHROPLASTY ANTERIOR APPROACH (Left) Active Problems:   Status post total hip replacement, left  Estimated body mass index is 26.73 kg/m as calculated from the following:   Height as of this encounter: 4\' 9"  (1.448 m).   Weight as of this encounter: 56 kg (123 lb 8 oz). Advance diet Up with therapy  Patient doing well this morning.  No complaints.  Vital signs are stable.  Encourage patient to use her incentive spirometer which was sitting across the room.  We will plan on patient discharging to home with home  health PT today after physical therapy.  Patient has home health therapy scheduled for this afternoon. Switch hospital wound VAC system over to [facility] prior to discharge   DVT Prophylaxis - Aspirin 81 mg BID Weight-Bearing as tolerated to left leg   T. Rachelle Hora, PA-C [place] 11/11/2017, 7:53 AM

## 2017-11-11 NOTE — Progress Notes (Signed)
Physical Therapy Treatment Patient Details Name: Jodi Colon MRN: [id number] DOB: 06/25/1929 Today's Date: 11/11/2017    History of Present Illness admitted for acute hospitalization status post L THR, anterior approach, 11/07/17; WBAT.    PT Comments    Sleeping soundly upon arrival to session, but awakens easily and participates well/fully with session.  All mobility performed at cga/close sup with RW, intermittent cuing for safety.  Patient/family deny additional questions or education needs.  Prepared for discharge home this date.   Follow Up Recommendations  Home health PT     Equipment Recommendations       Recommendations for Other Services       Precautions / Restrictions Precautions Precautions: Fall Restrictions Weight Bearing Restrictions: Yes LLE Weight Bearing: Weight bearing as tolerated    Mobility  Bed Mobility Overal bed mobility: Needs Assistance Bed Mobility: Supine to Sit     Supine to sit: Min assist     General bed mobility comments: increased cuing for wakefulness (sleeping soundly upon arrival), but improves appropriately with intervention  Transfers Overall transfer level: Needs assistance Equipment used: Rolling walker (2 wheeled) Transfers: Sit to/from Stand Sit to Stand: Min guard         General transfer comment: assist for anterior weight translation with initial sit/stand  Ambulation/Gait Ambulation/Gait assistance: Min guard;Supervision Gait Distance (Feet): 75 Feet Assistive device: Rolling walker (2 wheeled)       General Gait Details: min cuing for increased step height/length, postural extension.  Good stance time/weight acceptance L LE   Stairs             Wheelchair Mobility    Modified Rankin (Stroke Patients Only)       Balance Overall balance assessment: Needs assistance Sitting-balance support: No upper extremity supported;Feet supported Sitting balance-Leahy Scale: Good     Standing balance  support: Bilateral upper extremity supported Standing balance-Leahy Scale: Fair                              Cognition Arousal/Alertness: Awake/alert Behavior During Therapy: WFL for tasks assessed/performed Overall Cognitive Status: Within Functional Limits for tasks assessed                                        Exercises Other Exercises Other Exercises: Static stance at sink for oral care, grooming activities, cga/close sup-min cuing for safety awareness and positioning. Other Exercises: Upper and lower body dressing, min assist in unsupported sitting edge of bed.  Maintains R lateral weight shift (hesitant for WBing L hip), improved with cuing.    General Comments        Pertinent Vitals/Pain Pain Assessment: Faces Faces Pain Scale: Hurts little more Pain Location: L hip Pain Descriptors / Indicators: Aching;Guarding;Grimacing Pain Intervention(s): Limited activity within patient's tolerance;Monitored during session;Repositioned    Home Living                      Prior Function            PT Goals (current goals can now be found in the care plan section) Acute Rehab PT Goals Patient Stated Goal: to try to move around PT Goal Formulation: With patient Time For Goal Achievement: 11/23/17 Potential to Achieve Goals: Good Progress towards PT goals: Progressing toward goals    Frequency    BID  PT Plan Current plan remains appropriate    Co-evaluation              AM-PAC PT "6 Clicks" Daily Activity  Outcome Measure  Difficulty turning over in bed (including adjusting bedclothes, sheets and blankets)?: A Little Difficulty moving from lying on back to sitting on the side of the bed? : A Little Difficulty sitting down on and standing up from a chair with arms (e.g., wheelchair, bedside commode, etc,.)?: A Little Help needed moving to and from a bed to chair (including a wheelchair)?: A Little Help needed walking  in hospital room?: A Little Help needed climbing 3-5 steps with a railing? : A Little 6 Click Score: 18    End of Session Equipment Utilized During Treatment: Gait belt Activity Tolerance: Patient tolerated treatment well Patient left: in chair;with call bell/phone within reach;with chair alarm set;with family/visitor present Nurse Communication: Mobility status PT Visit Diagnosis: Muscle weakness (generalized) (M62.81);Difficulty in walking, not elsewhere classified (R26.2);Pain Pain - Right/Left: Left Pain - part of body: Hip     Time: 2376-2831 PT Time Calculation (min) (ACUTE ONLY): 28 min  Charges:  $Gait Training: 8-22 mins $Therapeutic Activity: 8-22 mins                    G Codes:      Daielle Melcher H. Owens Shark, PT, DPT, NCS 11/11/17, 11:14 AM [phone]

## 2017-11-11 NOTE — Discharge Instructions (Signed)
ANTERIOR APPROACH TOTAL HIP REPLACEMENT POSTOPERATIVE DIRECTIONS   Hip Rehabilitation, Guidelines Following Surgery  The results of a hip operation are greatly improved after range of motion and muscle strengthening exercises. Follow all safety measures which are given to protect your hip. If any of these exercises cause increased pain or swelling in your joint, decrease the amount until you are comfortable again. Then slowly increase the exercises. Call your caregiver if you have problems or questions.   HOME CARE INSTRUCTIONS  Remove items at home which could result in a fall. This includes throw rugs or furniture in walking pathways.   ICE to the affected hip every three hours for 30 minutes at a time and then as needed for pain and swelling.  Continue to use ice on the hip for pain and swelling from surgery. You may notice swelling that will progress down to the foot and ankle.  This is normal after surgery.  Elevate the leg when you are not up walking on it.    Continue to use the breathing machine which will help keep your temperature down.  It is common for your temperature to cycle up and down following surgery, especially at night when you are not up moving around and exerting yourself.  The breathing machine keeps your lungs expanded and your temperature down.  Do not place pillow under knee, focus on keeping the knee straight while resting  DIET You may resume your previous home diet once your are discharged from the hospital.  DRESSING / WOUND CARE / SHOWERING Please remove provena negative pressure dressing on 11/18/2017 and apply honey comb dressing. Keep dressing clean and dry at all times.   ACTIVITY Walk with your walker as instructed. Use walker as long as suggested by your caregivers. Avoid periods of inactivity such as sitting longer than an hour when not asleep. This helps prevent blood clots.  You may resume a sexual relationship in one month or when given the OK by  your doctor.  You may return to work once you are cleared by your doctor.  Do not drive a car for 6 weeks or until released by you surgeon.  Do not drive while taking narcotics.  WEIGHT BEARING Weight bearing as tolerated. Use walker/cane as needed for at least 4 weeks post op.  POSTOPERATIVE CONSTIPATION PROTOCOL Constipation - defined medically as fewer than three stools per week and severe constipation as less than one stool per week.  One of the most common issues patients have following surgery is constipation.  Even if you have a regular bowel pattern at home, your normal regimen is likely to be disrupted due to multiple reasons following surgery.  Combination of anesthesia, postoperative narcotics, change in appetite and fluid intake all can affect your bowels.  In order to avoid complications following surgery, here are some recommendations in order to help you during your recovery period.  Colace (docusate) - Pick up an over-the-counter form of Colace or another stool softener and take twice a day as long as you are requiring postoperative pain medications.  Take with a full glass of water daily.  If you experience loose stools or diarrhea, hold the colace until you stool forms back up.  If your symptoms do not get better within 1 week or if they get worse, check with your doctor.  Dulcolax (bisacodyl) - Pick up over-the-counter and take as directed by the product packaging as needed to assist with the movement of your bowels.  Take with a  full glass of water.  Use this product as needed if not relieved by Colace only.  ° °MiraLax (polyethylene glycol) - Pick up over-the-counter to have on hand.  MiraLax is a solution that will increase the amount of water in your bowels to assist with bowel movements.  Take as directed and can mix with a glass of water, juice, soda, coffee, or tea.  Take if you go more than two days without a movement. °Do not use MiraLax more than once per day. Call your  doctor if you are still constipated or irregular after using this medication for 7 days in a row. ° °If you continue to have problems with postoperative constipation, please contact the office for further assistance and recommendations.  If you experience "the worst abdominal pain ever" or develop nausea or vomiting, please contact the office immediatly for further recommendations for treatment. ° °ITCHING ° If you experience itching with your medications, try taking only a single pain pill, or even half a pain pill at a time.  You can also use Benadryl over the counter for itching or also to help with sleep.  ° °TED HOSE STOCKINGS °Wear the elastic stockings on both legs for six weeks following surgery during the day but you may remove then at night for sleeping. ° °MEDICATIONS °See your medication summary on the “After Visit Summary” that the nursing staff will review with you prior to discharge.  You may have some home medications which will be placed on hold until you complete the course of blood thinner medication.  It is important for you to complete the blood thinner medication as prescribed by your surgeon.  Continue your approved medications as instructed at time of discharge. ° °PRECAUTIONS °If you experience chest pain or shortness of breath - call 911 immediately for transfer to the hospital emergency department.  °If you develop a fever greater that 101 F, purulent drainage from wound, increased redness or drainage from wound, foul odor from the wound/dressing, or calf pain - CONTACT YOUR SURGEON.   °                                                °FOLLOW-UP APPOINTMENTS °Make sure you keep all of your appointments after your operation with your surgeon and caregivers. You should call the office at the above phone number and make an appointment for approximately two weeks after the date of your surgery or on the date instructed by your surgeon outlined in the "After Visit Summary". ° °RANGE OF MOTION  AND STRENGTHENING EXERCISES  °These exercises are designed to help you keep full movement of your hip joint. Follow your caregiver's or physical therapist's instructions. Perform all exercises about fifteen times, three times per day or as directed. Exercise both hips, even if you have had only one joint replacement. These exercises can be done on a training (exercise) mat, on the floor, on a table or on a bed. Use whatever works the best and is most comfortable for you. Use music or television while you are exercising so that the exercises are a pleasant break in your day. This will make your life better with the exercises acting as a break in routine you can look forward to.  °Lying on your back, slowly slide your foot toward your buttocks, raising your knee up off the floor. Then   slowly slide your foot back down until your leg is straight again.  °Lying on your back spread your legs as far apart as you can without causing discomfort.  °Lying on your side, raise your upper leg and foot straight up from the floor as far as is comfortable. Slowly lower the leg and repeat.  °Lying on your back, tighten up the muscle in the front of your thigh (quadriceps muscles). You can do this by keeping your leg straight and trying to raise your heel off the floor. This helps strengthen the largest muscle supporting your knee.  °Lying on your back, tighten up the muscles of your buttocks both with the legs straight and with the knee bent at a comfortable angle while keeping your heel on the floor.  ° °IF YOU ARE TRANSFERRED TO A SKILLED REHAB FACILITY °If the patient is transferred to a skilled rehab facility following release from the hospital, a list of the current medications will be sent to the facility for the patient to continue.  When discharged from the skilled rehab facility, please have the facility set up the patient's Home Health Physical Therapy prior to being released. Also, the skilled facility will be responsible  for providing the patient with their medications at time of release from the facility to include their pain medication, the muscle relaxants, and their blood thinner medication. If the patient is still at the rehab facility at time of the two week follow up appointment, the skilled rehab facility will also need to assist the patient in arranging follow up appointment in our office and any transportation needs. ° °MAKE SURE YOU:  °Understand these instructions.  °Get help right away if you are not doing well or get worse.  ° ° °Pick up stool softner and laxative for home use following surgery while on pain medications. °Continue to use ice for pain and swelling after surgery. °Do not use any lotions or creams on the incision until instructed by your surgeon. ° °

## 2017-11-11 NOTE — Care Management Important Message (Signed)
Copy of signed IM left with patient in room.  

## 2017-11-11 NOTE — Progress Notes (Signed)
Patient still somewhat drowsy, hold discharge. Dc narcotics and hope for dc home tomorrow

## 2017-11-11 NOTE — Progress Notes (Signed)
Pt was alert this morning for breakfast but appears more drowsy and fatigued at this time. Pt falls asleep while RN is speaking to her. Per family she only takes Zanaflex at night at home. RN is delaying discharge at this time to re-assess LOC this evening. PA was paged.

## 2017-11-11 NOTE — Care Management (Signed)
List of private duty agencies provided.

## 2017-11-11 NOTE — Care Management Note (Addendum)
Case Management Note  Patient Details  Name: Jodi Colon MRN: [id number] Date of Birth: 04/01/1930  Subjective/Objective:   Discharging today                Action/Plan:  [facility] made aware of discharge. Family questing if patient would qualify for SNF I explained why she would not due to her high performance with PT. No further questions at this time.   PT requested OT and HHA be added to home health. [facility] notified.   Expected Discharge Date:  11/11/17               Expected Discharge Plan:  [place]  In-House Referral:     Discharge planning Services     Post Acute Care Choice:  Home Health Choice offered to:  Patient, Adult Children  DME Arranged:    DME Agency:     HH Arranged:  PT HH Agency:     Status of Service:  Completed, signed off  If discussed at [place] of Stay Meetings, dates discussed:    Additional Comments:  [name], RN 11/11/2017, 9:09 AM

## 2017-11-11 NOTE — Clinical Social Work Note (Signed)
CSW was informed that patient may need SNF per family request.  CSW spoke with patient's son and daughter and discussed how insurance will most likely not approve patient for SNF because of how she is doing with PT.  CSW explained to patient's family that home health PT will work with her at home, and continue with some therapy for her.  Patient's family felt better realizing that home health will work with patient not just sit with her.  CSW told patient's family that there are private care agencies that can assist if they want to private pay, CSW also talked to them about ALF options in case they decide to pursue in the future.  CSW discussed with patient's family that if she deteriorates at home and she ends up needing placement in the future, home health can assist with it as well.  Patient's family expressed being nervous about her returning back home, but they are in agreement with home health after CSW and PT talked to them.  CSW to sign off please re consult if other social work needs arise.  Jones Broom. [place], MSW, [place]  11/11/2017 10:23 AM

## 2017-11-12 NOTE — Progress Notes (Signed)
Pt appears much more alert this morning and verbalizes feeling more awake this morning. Pt demonstrated sitting up on the edge of the bed without assistance and performed straight leg lifts.

## 2017-11-12 NOTE — Progress Notes (Signed)
VSS. Pt in no acute distress. IV removed. Discharge instructions explained to pt and family. Pt verbalized understanding. Wheeled to visitors entrance and assisted into daughters vehicle.

## 2017-11-12 NOTE — Progress Notes (Signed)
Physical Therapy Treatment Patient Details Name: Jodi Colon MRN: [id number] DOB: 07/30/1929 Today's Date: 11/12/2017    History of Present Illness admitted for acute hospitalization status post L THR, anterior approach, 11/07/17; WBAT.    PT Comments    Pt in recliner ready for session.  She was able to ambulate to/from bathroom then around unit with walker and slow steady gait.  Speed increased with time and no LOB noted.  She did require min guard for safety and min guard/min assist at times to assist with clothing for bathroom but overall did well.  Daughter in for session and discussed progress and answered questions as appropriate. Both stated they were comfortable and ready for to go home.   Follow Up Recommendations  Home health PT     Equipment Recommendations       Recommendations for Other Services       Precautions / Restrictions Precautions Precautions: Fall Restrictions Weight Bearing Restrictions: Yes LLE Weight Bearing: Weight bearing as tolerated    Mobility  Bed Mobility               General bed mobility comments: in recliner  Transfers   Equipment used: Rolling walker (2 wheeled) Transfers: Sit to/from Stand Sit to Stand: Min guard            Ambulation/Gait Ambulation/Gait assistance: Min guard Gait Distance (Feet): 200 Feet Assistive device: Rolling walker (2 wheeled) Gait Pattern/deviations: Step-through pattern;Decreased step length - right;Decreased step length - left Gait velocity: decreased   General Gait Details: no seated rest needed today   Stairs         General stair comments: declined stairs stating she felt comfortable from prior sesssions   Wheelchair Mobility    Modified Rankin (Stroke Patients Only)       Balance Overall balance assessment: Needs assistance Sitting-balance support: No upper extremity supported;Feet supported Sitting balance-Leahy Scale: Good     Standing balance support: No upper  extremity supported Standing balance-Leahy Scale: Fair Standing balance comment: able to pull pants up and adjust clothing with min gaurd and no LOB                            Cognition Arousal/Alertness: Awake/alert Behavior During Therapy: WFL for tasks assessed/performed Overall Cognitive Status: Within Functional Limits for tasks assessed                                        Exercises      General Comments        Pertinent Vitals/Pain Pain Assessment: 0-10 Pain Score: 2  Pain Location: L hip Pain Descriptors / Indicators: Sore Pain Intervention(s): Limited activity within patient's tolerance;Monitored during session    Home Living                      Prior Function            PT Goals (current goals can now be found in the care plan section) Progress towards PT goals: Progressing toward goals    Frequency    BID      PT Plan Current plan remains appropriate    Co-evaluation              AM-PAC PT "6 Clicks" Daily Activity  Outcome Measure  Difficulty turning over in bed (including adjusting  bedclothes, sheets and blankets)?: A Little Difficulty moving from lying on back to sitting on the side of the bed? : Unable Difficulty sitting down on and standing up from a chair with arms (e.g., wheelchair, bedside commode, etc,.)?: A Little Help needed moving to and from a bed to chair (including a wheelchair)?: A Little Help needed walking in hospital room?: A Little Help needed climbing 3-5 steps with a railing? : A Little 6 Click Score: 16    End of Session Equipment Utilized During Treatment: Gait belt Activity Tolerance: Patient tolerated treatment well Patient left: in chair;with chair alarm set;with call bell/phone within reach;with family/visitor present Nurse Communication: Other (comment) Pain - Right/Left: Left Pain - part of body: Hip     Time: 5859-2924 PT Time Calculation (min) (ACUTE ONLY): 23  min  Charges:  $Gait Training: 8-22 mins $Therapeutic Activity: 8-22 mins                    G Codes:       Chesley Noon, PTA 11/12/17, 9:45 AM

## 2017-11-12 NOTE — Care Management Note (Signed)
Case Management Note  Patient Details  Name: Jodi Colon MRN: [id number] Date of Birth: 1929/12/04  Subjective/Objective:      Patient to be discharged with HHPT services. Pt previously offered choice and selected [facility] agency. Referral confirmed Alwyn Ren with [facility]. RNCM to sign off Ines Bloomer [place] [phone]               Action/Plan:   Expected Discharge Date:  11/12/17               Expected Discharge Plan:  [place]  In-House Referral:     Discharge planning Services     Post Acute Care Choice:  Home Health Choice offered to:  Patient, Adult Children  DME Arranged:    DME Agency:     HH Arranged:  PT HH Agency:     Status of Service:  Completed, signed off  If discussed at [place] of Stay Meetings, dates discussed:    Additional Comments:  Latanya Maudlin, RN 11/12/2017, 9:59 AM

## 2017-11-12 NOTE — Progress Notes (Signed)
Subjective: 4 Days Post-Op Procedure(s) (LRB): TOTAL HIP ARTHROPLASTY ANTERIOR APPROACH (Left) Patient reports pain as mild.   Patient is well, and has had no acute complaints or problems Denies any CP, SOB, ABD pain. We will continue therapy today.  Pt is much less lethargic this AM, much more alert.  Objective: Vital signs in last 24 hours: Temp:  [98.3 F (36.8 C)-99.4 F (37.4 C)] 98.3 F (36.8 C) (06/15 0722) Pulse Rate:  [66-102] 90 (06/15 0722) Resp:  [16-18] 16 (06/15 0722) BP: (92-159)/(45-63) 159/56 (06/15 0722) SpO2:  [93 %-100 %] 96 % (06/15 0722)  Intake/Output from previous day: 06/14 0701 - 06/15 0700 In: 620 [P.O.:620] Out: -  Intake/Output this shift: No intake/output data recorded.  Recent Labs    11/10/17 0328  HGB 10.1*   Recent Labs    11/10/17 0328  WBC 7.7  RBC 3.20*  HCT 29.7*  PLT 136*   Recent Labs    11/10/17 0328  NA 134*  K 3.9  CL 102  CO2 25  BUN 20  CREATININE 0.67  GLUCOSE 120*  CALCIUM 8.3*   No results for input(s): LABPT, INR in the last 72 hours.  EXAM General - Patient is Alert, Appropriate and Oriented Extremity - Neurovascular intact Sensation intact distally Intact pulses distally Dorsiflexion/Plantar flexion intact No cellulitis present Compartment soft  - homans sign Dressing - dressing C/D/I and no drainage, wound VAC is intact with scant drainage.  Pt was transitioned to a [employer] wound vac yesterday. Motor Function - intact, moving foot and toes well on exam.   Past Medical History:  Diagnosis Date  . Adnexal cyst   . Arthritis   . Bladder cancer ([place]) 04/2015  . Brain aneurysm    small, followed with regular angiograms, Dr. Estanislado Pandy  . Breast cancer [facility]) 2007   DCIS-Right, s/p lumpectomy/xrt  . Cerebrovascular disease 2008   s/p PTCA/stent of MCA, Deveshwar  . Chronic kidney disease   . Complication of anesthesia    2003- trouble waking up after fracture surgery, no issues with  anesthesia since  . DDD (degenerative disc disease), cervical   . Degenerative joint disease    [facility]  . GERD (gastroesophageal reflux disease)   . Headache   . History of hiatal hernia   . History of MRSA infection   . History of TIA (transient ischemic attack)   . Hx MRSA infection   . Hyperlipidemia   . Hypertension   . Hypothyroidism   . Incomplete bladder emptying   . 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 MRSA (methicillin resistant Staphylococcus aureus) 2008   perineal abscess  . Nocturia   . Pneumonia   . Radiation 2007   BREAST CA  . Spinal stenosis   . Stroke ([place])   . Thyroid disease   . Urge incontinence     Assessment/Plan:   4 Days Post-Op Procedure(s) (LRB): TOTAL HIP ARTHROPLASTY ANTERIOR APPROACH (Left) Active Problems:   Status post total hip replacement, left  Estimated body mass index is 26.73 kg/m as calculated from the following:   Height as of this encounter: 4\' 9"  (1.448 m).   Weight as of this encounter: 56 kg (123 lb 8 oz). Advance diet Up with therapy  Patient doing well this morning.  No complaints.  Vital signs are stable.   More alert this AM compared to yesterday. Pt was transitioned to a [employer] yesterday. Plan will be  for discharge home today with HHPT.  DVT Prophylaxis - Aspirin 81 mg BID Weight-Bearing as tolerated to left leg  J. Cameron Proud, PA-C [place] 11/12/2017, 8:59 AM

## 2018-12-12 ENCOUNTER — Other Ambulatory Visit: Payer: Self-pay | Admitting: Internal Medicine

## 2018-12-12 DIAGNOSIS — Z1231 Encounter for screening mammogram for malignant neoplasm of breast: Secondary | ICD-10-CM

## 2018-12-18 ENCOUNTER — Ambulatory Visit
Admission: RE | Admit: 2018-12-18 | Discharge: 2018-12-18 | Disposition: A | Discharge status: Home / Self Care | Payer: Medicare Other | Source: Ambulatory Visit | Attending: Internal Medicine | Admitting: Internal Medicine

## 2018-12-18 DIAGNOSIS — Z1231 Encounter for screening mammogram for malignant neoplasm of breast: Secondary | ICD-10-CM | POA: Diagnosis present

## 2019-05-02 ENCOUNTER — Other Ambulatory Visit: Payer: Self-pay | Admitting: Gastroenterology

## 2019-05-02 DIAGNOSIS — R1310 Dysphagia, unspecified: Secondary | ICD-10-CM

## 2019-05-02 DIAGNOSIS — R1319 Other dysphagia: Secondary | ICD-10-CM

## 2019-05-18 DIAGNOSIS — H35371 Puckering of macula, right eye: Secondary | ICD-10-CM | POA: Insufficient documentation

## 2019-05-18 DIAGNOSIS — T8521XA Breakdown (mechanical) of intraocular lens, initial encounter: Secondary | ICD-10-CM | POA: Insufficient documentation

## 2019-05-18 DIAGNOSIS — Z961 Presence of intraocular lens: Secondary | ICD-10-CM | POA: Insufficient documentation

## 2019-05-18 DIAGNOSIS — H401132 Primary open-angle glaucoma, bilateral, moderate stage: Secondary | ICD-10-CM | POA: Insufficient documentation

## 2019-06-05 ENCOUNTER — Other Ambulatory Visit: Payer: Medicare Other

## 2019-06-12 ENCOUNTER — Ambulatory Visit
Admission: RE | Admit: 2019-06-12 | Discharge: 2019-06-12 | Disposition: A | Discharge status: Home / Self Care | Payer: Medicare Other | Source: Ambulatory Visit | Attending: Gastroenterology | Admitting: Gastroenterology

## 2019-06-12 DIAGNOSIS — R1319 Other dysphagia: Secondary | ICD-10-CM

## 2019-06-12 DIAGNOSIS — R1310 Dysphagia, unspecified: Secondary | ICD-10-CM

## 2019-09-08 IMAGING — MG MM DIGITAL SCREENING BILAT W/ TOMO W/ CAD
9 of 13 series · 9 of 29 positions shown · non-contrast
Comparison: Previous exam(s).

CLINICAL DATA: Screening.

EXAM:
DIGITAL SCREENING BILATERAL MAMMOGRAM WITH TOMO AND CAD

[R XCCL]
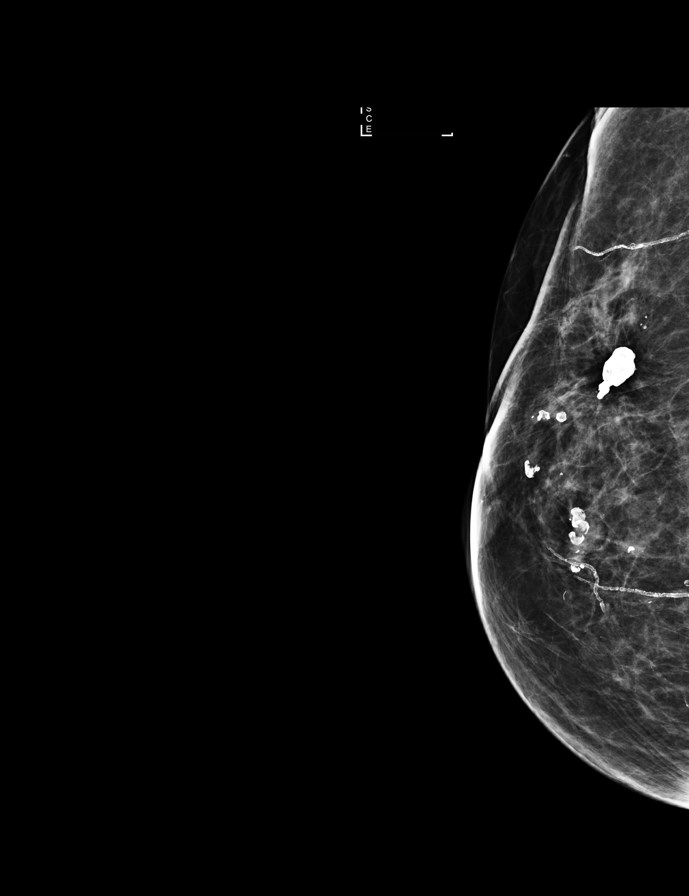

[L CC synth-2D]
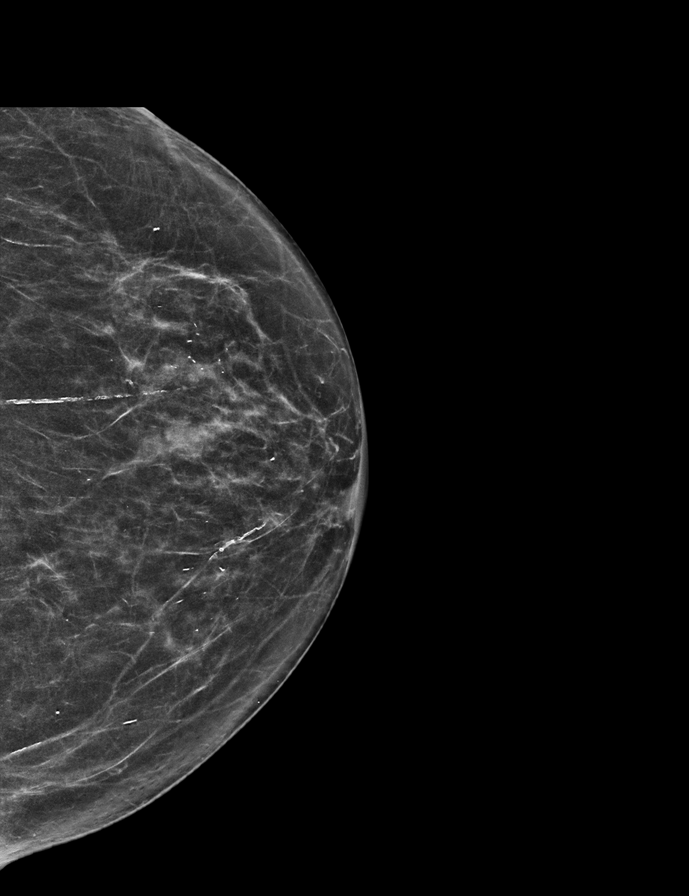

[L MLO synth-2D]
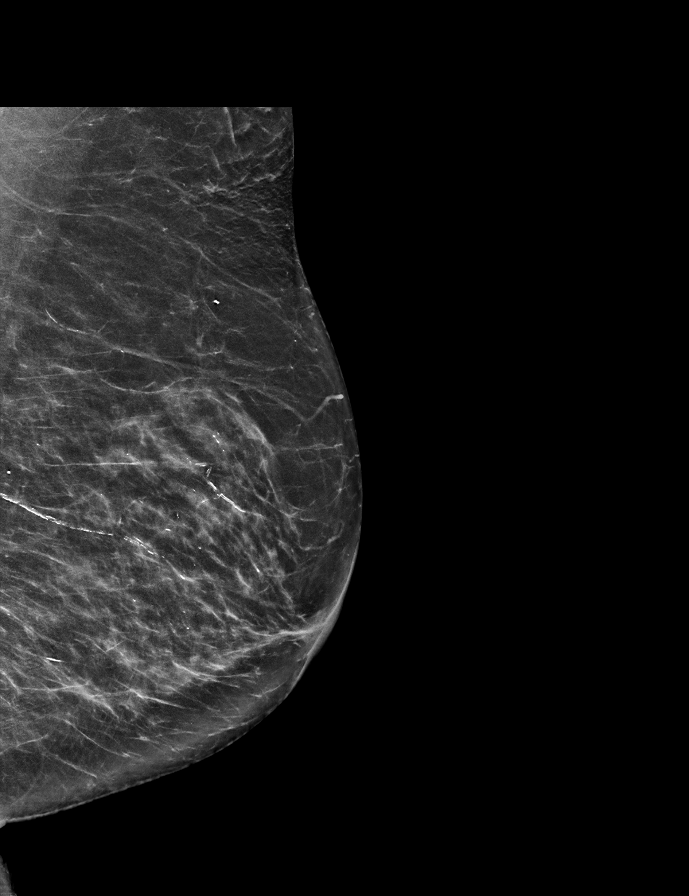

[R CC synth-2D]
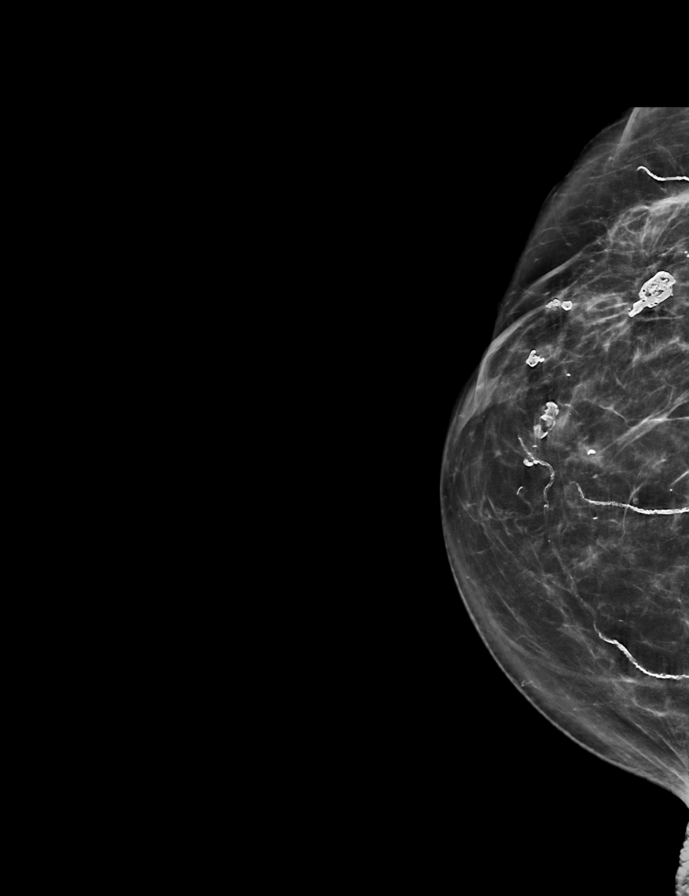

[R MLO synth-2D]
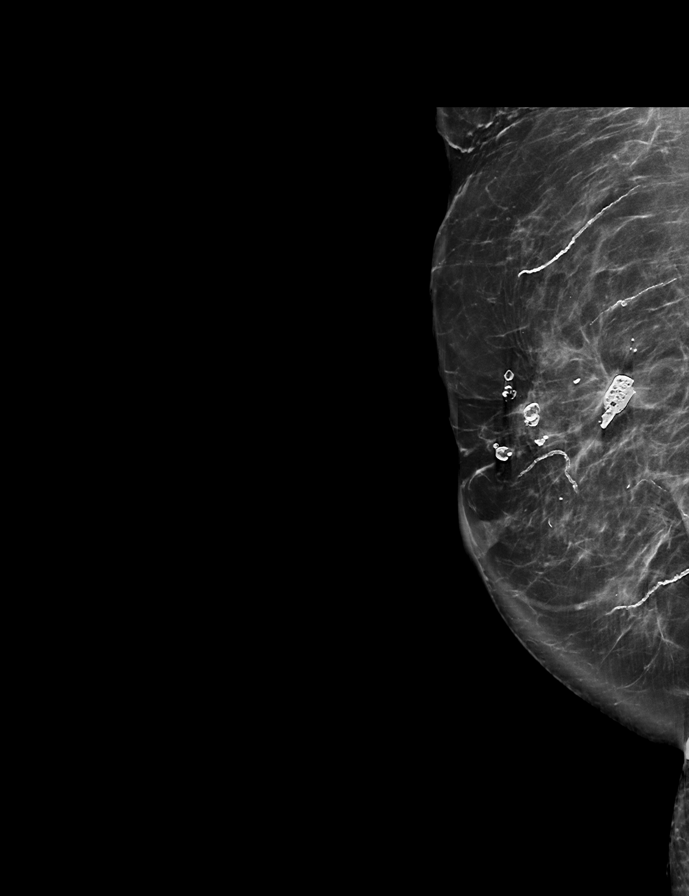

[L CC]
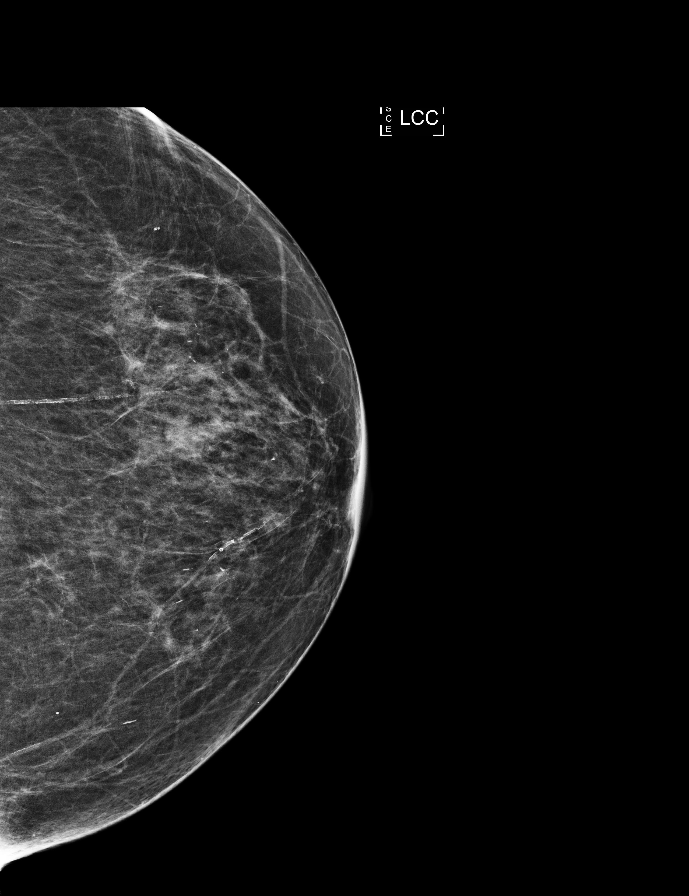

[L MLO]
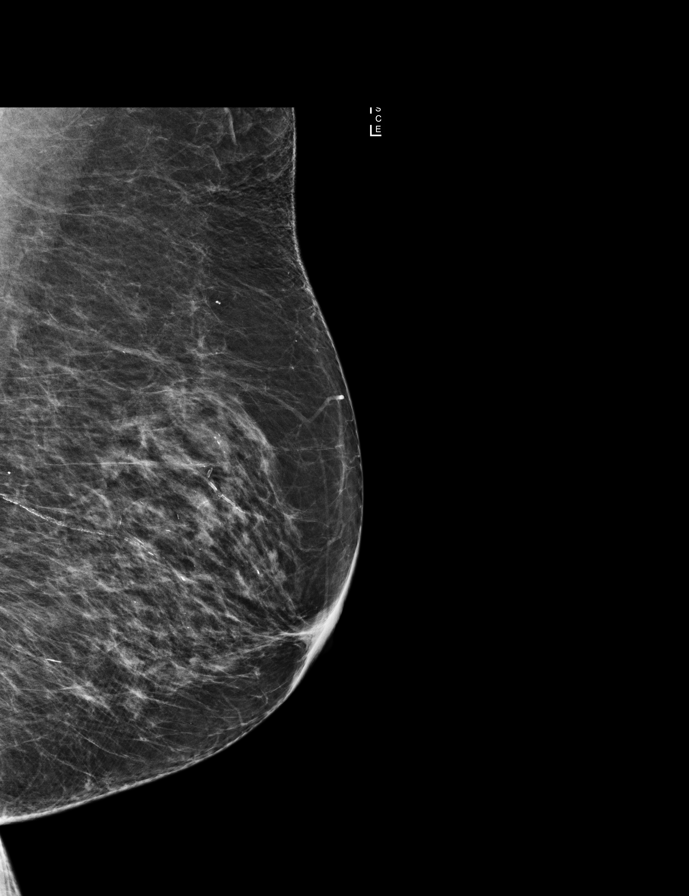

[R MLO]
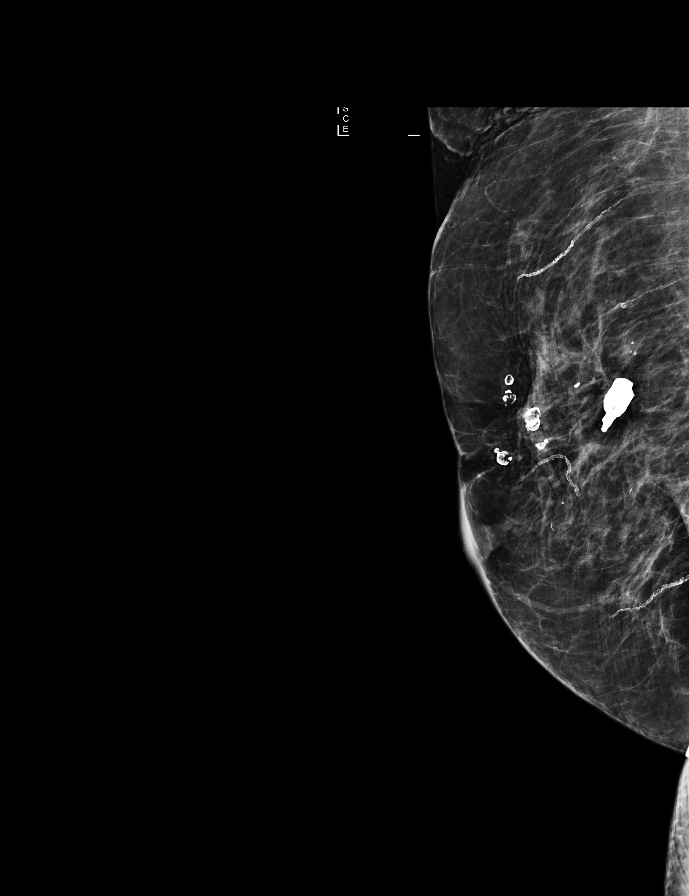

[R CC]
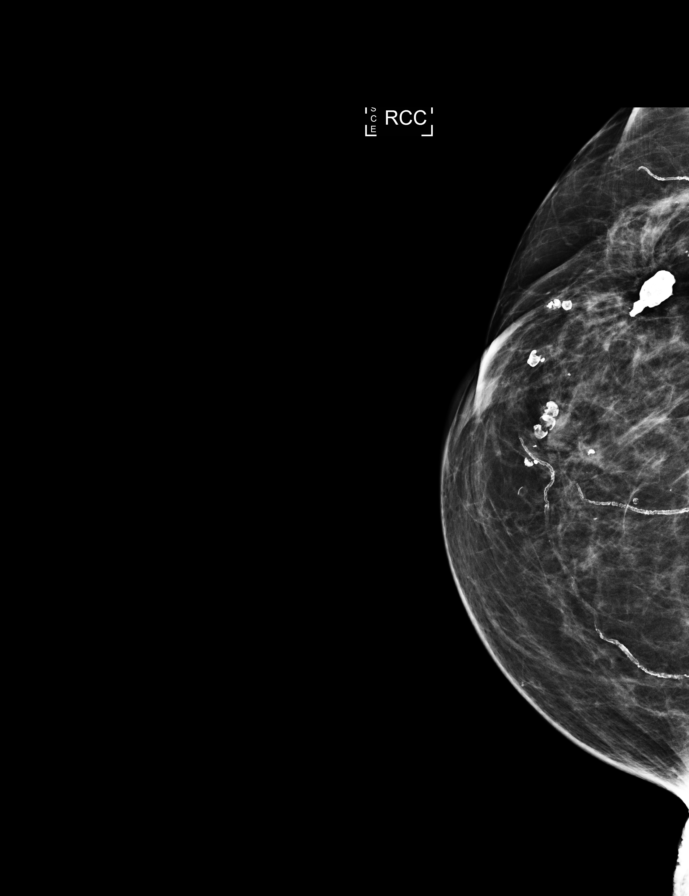

[9 of 29 positions shown; findings below may reference images not displayed]

ACR Breast Density Category b: There are scattered areas of
fibroglandular density.
FINDINGS: There are no findings suspicious for malignancy. Post operative
changes are seen in the rightbreast. Images were processed with CAD.
IMPRESSION: No mammographic evidence of malignancy. A result letter of this
screening mammogram will be mailed directly to the patient.

RECOMMENDATION:
Screening mammogram in one year. (Code:[id number])

BI-RADS CATEGORY  1: Negative.

## 2019-11-26 ENCOUNTER — Emergency Department
Admission: EM | Admit: 2019-11-26 | Discharge: 2019-11-26 | Disposition: A | Discharge status: Home / Self Care | Payer: Medicare Other | Attending: Student in an Organized Health Care Education/Training Program | Admitting: Student in an Organized Health Care Education/Training Program

## 2019-11-26 ENCOUNTER — Other Ambulatory Visit: Payer: Self-pay

## 2019-11-26 ENCOUNTER — Emergency Department: Payer: Medicare Other

## 2019-11-26 ENCOUNTER — Encounter: Payer: Self-pay | Admitting: Emergency Medicine

## 2019-11-26 DIAGNOSIS — S60211A Contusion of right wrist, initial encounter: Secondary | ICD-10-CM | POA: Diagnosis not present

## 2019-11-26 DIAGNOSIS — S62521A Displaced fracture of distal phalanx of right thumb, initial encounter for closed fracture: Secondary | ICD-10-CM | POA: Diagnosis not present

## 2019-11-26 DIAGNOSIS — W010XXA Fall on same level from slipping, tripping and stumbling without subsequent striking against object, initial encounter: Secondary | ICD-10-CM | POA: Insufficient documentation

## 2019-11-26 DIAGNOSIS — S62639B Displaced fracture of distal phalanx of unspecified finger, initial encounter for open fracture: Secondary | ICD-10-CM

## 2019-11-26 DIAGNOSIS — Y939 Activity, unspecified: Secondary | ICD-10-CM | POA: Insufficient documentation

## 2019-11-26 DIAGNOSIS — E039 Hypothyroidism, unspecified: Secondary | ICD-10-CM | POA: Diagnosis not present

## 2019-11-26 DIAGNOSIS — Z7982 Long term (current) use of aspirin: Secondary | ICD-10-CM | POA: Diagnosis not present

## 2019-11-26 DIAGNOSIS — S60944A Unspecified superficial injury of right ring finger, initial encounter: Secondary | ICD-10-CM | POA: Diagnosis present

## 2019-11-26 DIAGNOSIS — S0990XA Unspecified injury of head, initial encounter: Secondary | ICD-10-CM | POA: Insufficient documentation

## 2019-11-26 DIAGNOSIS — N189 Chronic kidney disease, unspecified: Secondary | ICD-10-CM | POA: Insufficient documentation

## 2019-11-26 DIAGNOSIS — Z853 Personal history of malignant neoplasm of breast: Secondary | ICD-10-CM | POA: Insufficient documentation

## 2019-11-26 DIAGNOSIS — I129 Hypertensive chronic kidney disease with stage 1 through stage 4 chronic kidney disease, or unspecified chronic kidney disease: Secondary | ICD-10-CM | POA: Insufficient documentation

## 2019-11-26 DIAGNOSIS — Y929 Unspecified place or not applicable: Secondary | ICD-10-CM | POA: Insufficient documentation

## 2019-11-26 DIAGNOSIS — W19XXXA Unspecified fall, initial encounter: Secondary | ICD-10-CM

## 2019-11-26 DIAGNOSIS — S51011A Laceration without foreign body of right elbow, initial encounter: Secondary | ICD-10-CM | POA: Diagnosis not present

## 2019-11-26 DIAGNOSIS — Y999 Unspecified external cause status: Secondary | ICD-10-CM | POA: Insufficient documentation

## 2019-11-26 MED ORDER — BACITRACIN-NEOMYCIN-POLYMYXIN OINTMENT TUBE
TOPICAL_OINTMENT | Freq: Once | CUTANEOUS | Status: AC
  Administered 2019-11-26: 1 via TOPICAL
  Filled 2019-11-26: qty 14.17

## 2019-11-26 MED ORDER — CLINDAMYCIN HCL 300 MG PO CAPS: 300.0000 mg | ORAL_CAPSULE | Freq: Three times a day (TID) | ORAL | 0 refills | Status: AC

## 2019-11-26 MED ORDER — CLINDAMYCIN HCL 150 MG PO CAPS
300.0000 mg | ORAL_CAPSULE | Freq: Once | ORAL | Status: AC
  Administered 2019-11-26: 300 mg via ORAL
  Filled 2019-11-26: qty 2

## 2019-11-26 NOTE — Discharge Instructions (Addendum)
Please keep your elbow and your finger bandaged.  Keep splint on your finger. Begin antibiotics tonight.  You have an appointment scheduled with Dr. Peggye Ley tomorrow morning at 9:30 AM.  Please also call Dr. Wilber Bihari a follow-up appointment within 48 hours.  Return to the emergency department for any worsening of symptoms.

## 2019-11-26 NOTE — ED Notes (Signed)
Pt discharged home after verbalizing understanding of discharge instructions; nad noted. 

## 2019-11-26 NOTE — ED Provider Notes (Addendum)
[facility] Emergency Department Provider Note  ____________________________________________  Time seen: Approximately 1:29 PM  I have reviewed the triage vital signs and the nursing notes.   HISTORY  Chief Complaint Fall    HPI Jodi Colon is a 86 y.o. female that presents to emergency department for evaluation after a fall.  Patient was trying to get out of bed this morning at 730.  She usually puts her shoes on right away but could not reach her shoes to try to get up on the slippery floor.  This caused her to fall backwards and fall.  She thinks she did hit her head but her "family could not find any lumps."  She did not lose consciousness.  She remembers the fall.  She is not sure what she caught her finger or arm on.  She has a skin tear to her right elbow and an avulsion to her right ring finger.  She has some bruising over her wrist but not having any pain to her wrist.  She is able to move her elbow normally.  She presents today with her daughter.  She denies any dizziness or syncopal episode.  No headache, neck pain, back pain, shortness breath, chest pain, abdominal pain.  Past Medical History:  Diagnosis Date  . Adnexal cyst   . Arthritis   . Bladder cancer ([place]) 04/2015  . Brain aneurysm    small, followed with regular angiograms, Dr. Estanislado Pandy  . Breast cancer [facility]) 2007   DCIS-Right, s/p lumpectomy/xrt  . Cerebrovascular disease 2008   s/p PTCA/stent of MCA, [place]  . Chronic kidney disease   . Complication of anesthesia    2003- trouble waking up after fracture surgery, no issues with anesthesia since  . DDD (degenerative disc disease), cervical   . Degenerative joint disease    managed by the [facility]  . GERD (gastroesophageal reflux disease)   . Headache   . History of hiatal hernia   . History of MRSA infection   . History of TIA (transient ischemic attack)   . Hx MRSA infection   . Hyperlipidemia   . Hypertension   .  Hypothyroidism   . Incomplete bladder emptying   . 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 ([place])   . MRSA (methicillin resistant Staphylococcus aureus) 2008   perineal abscess  . Nocturia   . Pneumonia   . Radiation 2007   BREAST CA  . Spinal stenosis   . Stroke ([place])   . Thyroid disease   . Urge incontinence     Patient Active Problem List   Diagnosis Date Noted  . Status post total hip replacement, left 11/08/2017  . Umbilical hernia without obstruction and without gangrene 07/28/2016  . Restless leg 10/21/2015  . Urinary frequency 10/15/2015  . Dysuria 10/15/2015  . Bursitis, trochanteric 04/02/2015  . Trochanteric bursitis of right hip 04/02/2015  . Disease of thyroid gland 03/04/2015  . Acid reflux 03/04/2015  . Subarachnoid hemorrhage ([place]) 02/09/2015  . Degenerative arthritis of lumbar spine 01/29/2015  . DDD (degenerative disc disease), lumbar 01/29/2015  . Arthritis, degenerative 01/28/2014  . Infection with methicillin-resistant Staphylococcus aureus 01/28/2014  . BP (high blood pressure) 01/28/2014  . HLD (hyperlipidemia) 01/28/2014  . Carcinoma in situ, breast, ductal 01/28/2014  . Clinical depression 01/28/2014  . Aneurysm, cerebral 01/28/2014  . Intraductal carcinoma of breast 01/28/2014  . Lumbar canal stenosis 12/07/2013  . Neuritis or radiculitis due to rupture of lumbar intervertebral disc 12/07/2013  .  Cramp in lower leg 06/19/2013  . Hemorrhoids that prolapse with straining, but retract spontaneously 01/24/2013  . Scalp lesion 11/27/2012  . Rectal prolapse 11/27/2012  . Hemorrhoid 08/29/2012  . Screening breast examination 07/12/2012  . FOM (frequency of micturition) 02/28/2012  . Urge incontinence 02/28/2012  . History of neoplasm of bladder 02/28/2012  . Excessive urination at night 02/28/2012  . Bladder CA in situ 02/28/2012  . History of methicillin resistant Staphylococcus aureus infection 12/30/2011  . H/O infectious  disease 12/30/2011  . Cutaneous ulcer ([place]) 11/26/2011  . Skin ulcer ([place]) 11/26/2011  . Colon polyp 11/11/2011  . Absolute anemia 11/11/2011  . Back pain, chronic 11/01/2011  . Abscess, gluteal, right 10/10/2011  . Abscess of buttock 10/10/2011  . Degenerative joint disease   . Cerebrovascular disease   . MRSA (methicillin resistant Staphylococcus aureus)   . Hyperlipidemia LDL goal < 100 04/02/2011  . Bladder cancer ([place]) 10/29/2009  . Malignant neoplasm of urinary bladder ([place]) 10/29/2009    Past Surgical History:  Procedure Laterality Date  . ABDOMINAL HYSTERECTOMY    . AQUEOUS SHUNT Left 01/05/2016   Procedure: AQUEOUS SHUNT;  Surgeon: Ronnell Freshwater, MD;  Location: [place];  Service: Ophthalmology;  Laterality: Left;  AHMED VALVE WITH SCLERAL PATCH GRAFT  . AQUEOUS SHUNT Right 07/19/2016   Procedure: AQUEOUS SHUNT;  Surgeon: Ronnell Freshwater, MD;  Location: [place];  Service: Ophthalmology;  Laterality: Right;  Ahmed tube shunt  . BACK SURGERY    . BLADDER SURGERY  2012, 2014  . BRAIN SURGERY Right 2008   stent, [facility], [place]  . BREAST LUMPECTOMY Right 2007   DCIS, Lumpectomy F/U radiation   . CARPAL TUNNEL RELEASE Left 07/2017  . CATARACT EXTRACTION    . CHOLECYSTECTOMY    . COLONOSCOPY  2013  . CYSTOSCOPY W/ RETROGRADES Bilateral 05/20/2015   Procedure: CYSTOSCOPY WITH RETROGRADE PYELOGRAM;  Surgeon: Hollice Espy, MD;  Location: [facility];  Service: Urology;  Laterality: Bilateral;  . CYSTOSCOPY W/ RETROGRADES Bilateral 12/24/2015   Procedure: CYSTOSCOPY WITH RETROGRADE PYELOGRAM;  Surgeon: Hollice Espy, MD;  Location: [facility];  Service: Urology;  Laterality: Bilateral;  . CYSTOSCOPY WITH BIOPSY N/A 05/20/2015   Procedure: CYSTOSCOPY WITH BIOPSY;  Surgeon: Hollice Espy, MD;  Location: [facility];  Service: Urology;  Laterality: N/A;  . CYSTOSCOPY WITH BIOPSY N/A 12/24/2015   Procedure: CYSTOSCOPY WITH BIOPSY;  Surgeon:  Hollice Espy, MD;  Location: [facility];  Service: Urology;  Laterality: N/A;  . EYE SURGERY Bilateral    Cataract Extraction, glaucoma surgery 2018, 2019  . FRACTURE SURGERY Left    Ankle, compound fracture  . HEMORRHOID SURGERY  02/01/13  . LAPAROSCOPIC HYSTERECTOMY    . TONSILLECTOMY    . TOTAL HIP ARTHROPLASTY Left 11/08/2017   Procedure: TOTAL HIP ARTHROPLASTY ANTERIOR APPROACH;  Surgeon: Hessie Knows, MD;  Location: [facility];  Service: Orthopedics;  Laterality: Left;    Prior to Admission medications   Medication Sig Start Date End Date Taking? Authorizing Provider  aspirin 81 MG chewable tablet Chew 1 tablet (81 mg total) by mouth 2 (two) times daily. 11/10/17   Duanne Guess, PA-C  atorvastatin (LIPITOR) 20 MG tablet Take 20 mg by mouth every morning.    [provider]  cetirizine (ZYRTEC) 10 MG tablet Take 10 mg by mouth daily as needed for allergies.    [provider]  clindamycin (CLEOCIN) 300 MG capsule Take 1 capsule (300 mg total) by mouth 3 (three)  times daily for 10 days. 11/26/19 12/06/19  Laban Emperor, PA-C  fluticasone (FLONASE) 50 MCG/ACT nasal spray Place 1 spray into both nostrils daily as needed for allergies or rhinitis.    [provider]  gabapentin (NEURONTIN) 300 MG capsule Take 300 mg by mouth 2 (two) times daily.    [provider]  hydrocortisone 2.5 % cream Apply 1 application topically daily as needed (rash).    [provider]  levothyroxine (SYNTHROID, LEVOTHROID) 50 MCG tablet Take 50 mcg by mouth daily before breakfast.  11/23/13   [provider]  Multiple Vitamin (MULTIVITAMIN) tablet Take 1 tablet by mouth daily.      [provider]  nortriptyline (PAMELOR) 10 MG capsule Take 10 mg by mouth at bedtime.    [provider]  oxybutynin (DITROPAN) 5 MG tablet Take 5 mg by mouth 2 (two) times daily.    [provider]  pantoprazole (PROTONIX) 40 MG tablet Take 40 mg by mouth 2  (two) times daily.  06/19/13   [provider]  rOPINIRole (REQUIP) 0.5 MG tablet Take 0.5 mg by mouth at bedtime.  06/10/16   [provider]  senna-docusate (SENOKOT-S) 8.6-50 MG tablet Take 1 tablet by mouth at bedtime as needed for mild constipation. 11/09/17   Duanne Guess, PA-C  timolol (TIMOPTIC) 0.5 % ophthalmic solution Place 1 drop into both eyes daily.  10/22/15   [provider]  tiZANidine (ZANAFLEX) 2 MG tablet Take 2 mg by mouth 2 (two) times daily.  04/02/15   [provider]  venlafaxine XR (EFFEXOR-XR) 75 MG 24 hr capsule Take 75 mg by mouth daily with breakfast.     [provider]    Allergies Penicillins, Sulfasalazine, and Sulfa antibiotics  Family History  Problem Relation Age of Onset  . Fibromyalgia Daughter   . Breast cancer Neg Hx     Social History Social History   Tobacco Use  . Smoking status: Never Smoker  . Smokeless tobacco: Never Used  Vaping Use  . Vaping Use: Never used  Substance Use Topics  . Alcohol use: No  . Drug use: No     Review of Systems  Cardiovascular: No chest pain. Respiratory: No cough. No SOB. Gastrointestinal: No abdominal pain.  No nausea, no vomiting.  Musculoskeletal: Positive for finger pain. Skin: Negative for rash.positive for ecchymosis.  Positive for avulsion. Neurological: Negative for headaches, numbness or tingling   ____________________________________________   PHYSICAL EXAM:  VITAL SIGNS: ED Triage Vitals  Enc Vitals Group     BP 11/26/19 0931 (!) 196/80     Pulse Rate 11/26/19 0931 70     Resp 11/26/19 0931 16     Temp 11/26/19 0931 98.4 F (36.9 C)     Temp Source 11/26/19 0931 Oral     SpO2 11/26/19 0931 99 %     Weight 11/26/19 0932 124 lb (56.2 kg)     Height 11/26/19 0932 4\' 9"  (1.448 m)     Head Circumference --      Peak Flow --      Pain Score 11/26/19 0931 3     Pain Loc --      Pain Edu? --      Excl. in [place]? --      Constitutional:  Alert and oriented. Well appearing and in no acute distress. Eyes: Conjunctivae are normal. PERRL. EOMI. Head: Atraumatic. ENT:      Ears:      Nose: No  congestion/rhinnorhea.      Mouth/Throat: Mucous membranes are moist.  Neck: No stridor.No cervical spine tenderness to palpation. Cardiovascular: Normal rate, regular rhythm.  Good peripheral circulation. Respiratory: Normal respiratory effort without tachypnea or retractions. Lungs CTAB. Good air entry to the bases with no decreased or absent breath sounds. Gastrointestinal: Bowel sounds 4 quadrants. Soft and nontender to palpation. No guarding or rigidity. No palpable masses. No distention. Musculoskeletal: Full range of motion to all extremities. No gross deformities appreciated.  Ambulatory without assistance. Neurologic:  Normal speech and language. No gross focal neurologic deficits are appreciated.  Skin:  Skin is warm, dry and intact.  Avulsion to the distal right ring finger that has avulsed all skin from the DIP joint to the tip of the finger on the dorsal surface.  Exposed adipose tissue.  No visible bone.   No injury to the fingernail.  Skin tear to the right elbow without any exposed adipose tissue. Psychiatric: Mood and affect are normal. Speech and behavior are normal. Patient exhibits appropriate insight and judgement.   ____________________________________________   LABS (all labs ordered are listed, but only abnormal results are displayed)  Labs Reviewed - No data to display ____________________________________________  EKG   ____________________________________________  RADIOLOGY Robinette Haines, personally viewed and evaluated these images (plain radiographs) as part of my medical decision making, as well as reviewing the written report by the radiologist.  CT Head Wo Contrast  Result Date: 11/26/2019 CLINICAL DATA:  Pain following fall EXAM: CT HEAD WITHOUT CONTRAST TECHNIQUE: Contiguous axial images were  obtained from the base of the skull through the vertex without intravenous contrast. COMPARISON:  September 10, 2017 FINDINGS: Brain: There is generalized atrophy, stable, most notably involving frontal and temporal lobe regions bilaterally. There is no intracranial mass, hemorrhage, extra-axial fluid collection, or midline shift. There is patchy small vessel disease in the centra semiovale bilaterally. Small vessel disease is noted in the anterior limb of each internal and external capsule, stable. Evidence of a prior small lacunar type infarct in the posterior left lentiform nucleus noted. No acute infarct is appreciable on this study. Vascular: No hyperdense vessel. There is calcification in each distal vertebral artery and carotid siphon region. Skull: The bony calvarium appears intact. Sinuses/Orbits: There is mucosal thickening involving multiple ethmoid air cells. Other visualized paranasal sinuses are clear. Calcification along each superior globe and is stable. Other: Mastoid air cells are clear. IMPRESSION: Stable atrophy with supratentorial small vessel disease. No acute infarct evident. No mass or hemorrhage. There are foci of arterial vascular calcification. There is mucosal thickening in multiple ethmoid air cells. Electronically Signed   By: Lowella Grip III M.D.   On: 11/26/2019 10:01   DG Finger Ring Right  Result Date: 11/26/2019 CLINICAL DATA:  Trauma to the tip of the ring finger secondary to a fall out of bed this morning. EXAM: RIGHT RING FINGER 2+V COMPARISON:  None. FINDINGS: There is an avulsion of the tip of the tuft of the ring finger. Slight arthritic changes of the IP joints. Soft tissue laceration. IMPRESSION: Avulsion of the tip of the tuft of the ring finger. Electronically Signed   By: Lorriane Shire M.D.   On: 11/26/2019 10:17    ____________________________________________    PROCEDURES  Procedure(s) performed:    Procedures    Medications   neomycin-bacitracin-polymyxin (NEOSPORIN) ointment (1 application Topical Given 11/26/19 1435)  clindamycin (CLEOCIN) capsule 300 mg (300 mg Oral Given 11/26/19 1436)     ____________________________________________  INITIAL IMPRESSION / ASSESSMENT AND PLAN / ED COURSE  Pertinent labs & imaging results that were available during my care of the patient were reviewed by me and considered in my medical decision making (see chart for details).  Review of the [place] CSRS was performed in accordance of the [facility] prior to dispensing any controlled drugs.     Patient presented to emergency department for evaluation after a fall today.  Vital signs and exam are reassuring.  Head CT negative for acute abnormalities.  Finger x-ray shows a distal tuft fracture.  On exam, patient has a complete skin avulsion to the distal right ring finger from the DIP joint to the tip of her finger.  She is unsure where the piece of skin is.  She has exposure of adipose tissue but I do not visualize any bone.  Dr. Rip Harbour has also been in to evaluate the patient and recommends orthopedics consult.  Dr. Harlow Mares was consulted and recommends either outpatient referral to Dr. Peggye Ley or consult with outside hospital.  Dr. Peggye Ley was called in her office and she has made an appointment to see the patient tomorrow morning at 9:30 AM.  Patient is agreeable to follow-up with her in the office.  Bulky dressing was placed with Neosporin, Xeroform, gauze and a finger splint.  Bulky dressing was also placed to her elbow.  Derma clip was placed to the skin tear on her elbow.  Patient denies any additional pain.  Daughter will take patient home and ensure she makes her appointment tomorrow. Dr. Cinda Quest is also in agreement with plan of care.  Patient will be discharged home with prescriptions for clindamycin.  Patient is to follow up with orthopedics and primary care as directed. Patient is given ED precautions to return to the ED for any worsening  or new symptoms.   DERINDA BARTUS was evaluated in Emergency Department on 11/26/2019 for the symptoms described in the history of present illness. She was evaluated in the context of the global COVID-19 pandemic, which necessitated consideration that the patient might be at risk for infection with the SARS-CoV-2 virus that causes COVID-19. Institutional protocols and algorithms that pertain to the evaluation of patients at risk for COVID-19 are in a state of rapid change based on information released by regulatory bodies including the CDC and federal and state organizations. These policies and algorithms were followed during the patient's care in the ED.  ____________________________________________  FINAL CLINICAL IMPRESSION(S) / ED DIAGNOSES  Final diagnoses:  Fall, initial encounter  Open fracture of tuft of distal phalanx of finger      NEW MEDICATIONS STARTED DURING THIS VISIT:  ED Discharge Orders         Ordered    clindamycin (CLEOCIN) 300 MG capsule  3 times daily     Discontinue  Reprint     11/26/19 1449              This chart was dictated using voice recognition software/Dragon. Despite best efforts to proofread, errors can occur which can change the meaning. Any change was purely unintentional.    Laban Emperor, PA-C 11/26/19 1849    Laban Emperor, PA-C 11/26/19 1850    Nena Polio, MD 11/27/19 2028

## 2019-11-26 NOTE — ED Triage Notes (Addendum)
Patient presents to the ED via EMS from home for a fall.  Patient states she was attempting to get out of bed at 7:30am and she slipped on the floor and fell backwards.  Patient has bruising to her right wrist, a skin tear to her right ring finger and elbow and she is unsure of whether or not she hit her head.  Patient denies loss of consciousness.  Patient does not take blood thinners.

## 2019-11-26 NOTE — ED Notes (Signed)
See triage note  Presents s/p fall  States she was trying to get up out of bed    Fell backwards  Hit head  No LOC  Also injury to right hand  Mainly right ring and 5 th finger

## 2019-12-05 ENCOUNTER — Emergency Department (HOSPITAL_COMMUNITY)
Admission: EM | Admit: 2019-12-05 | Discharge: 2019-12-05 | Disposition: A | Discharge status: Home / Self Care | Payer: Medicare Other | Attending: Emergency Medicine | Admitting: Emergency Medicine

## 2019-12-05 ENCOUNTER — Encounter (HOSPITAL_COMMUNITY): Payer: Self-pay | Admitting: Emergency Medicine

## 2019-12-05 ENCOUNTER — Other Ambulatory Visit: Payer: Self-pay

## 2019-12-05 DIAGNOSIS — Z5321 Procedure and treatment not carried out due to patient leaving prior to being seen by health care provider: Secondary | ICD-10-CM | POA: Insufficient documentation

## 2019-12-05 DIAGNOSIS — N390 Urinary tract infection, site not specified: Secondary | ICD-10-CM | POA: Diagnosis not present

## 2019-12-05 DIAGNOSIS — W19XXXA Unspecified fall, initial encounter: Secondary | ICD-10-CM | POA: Insufficient documentation

## 2019-12-05 NOTE — ED Triage Notes (Signed)
Pt reports that she has had multiple falls since [employer] weekend. Reports that started Cipro yesterday for UTI. Unsure what is causing the falls.

## 2019-12-06 ENCOUNTER — Emergency Department (HOSPITAL_COMMUNITY): Payer: Medicare Other

## 2019-12-06 ENCOUNTER — Encounter (HOSPITAL_COMMUNITY): Payer: Self-pay

## 2019-12-06 ENCOUNTER — Other Ambulatory Visit: Payer: Self-pay

## 2019-12-06 ENCOUNTER — Emergency Department (HOSPITAL_COMMUNITY)
Admission: EM | Admit: 2019-12-06 | Discharge: 2019-12-07 | Disposition: A | Discharge status: Home / Self Care | Payer: Medicare Other | Attending: Emergency Medicine | Admitting: Emergency Medicine

## 2019-12-06 DIAGNOSIS — D090 Carcinoma in situ of bladder: Secondary | ICD-10-CM | POA: Insufficient documentation

## 2019-12-06 DIAGNOSIS — D649 Anemia, unspecified: Secondary | ICD-10-CM | POA: Insufficient documentation

## 2019-12-06 DIAGNOSIS — Z79899 Other long term (current) drug therapy: Secondary | ICD-10-CM | POA: Insufficient documentation

## 2019-12-06 DIAGNOSIS — R296 Repeated falls: Secondary | ICD-10-CM | POA: Diagnosis present

## 2019-12-06 DIAGNOSIS — R531 Weakness: Secondary | ICD-10-CM | POA: Diagnosis not present

## 2019-12-06 DIAGNOSIS — Z20822 Contact with and (suspected) exposure to covid-19: Secondary | ICD-10-CM | POA: Diagnosis not present

## 2019-12-06 DIAGNOSIS — Z880 Allergy status to penicillin: Secondary | ICD-10-CM | POA: Insufficient documentation

## 2019-12-06 DIAGNOSIS — C679 Malignant neoplasm of bladder, unspecified: Secondary | ICD-10-CM | POA: Diagnosis not present

## 2019-12-06 LAB — CBC WITH DIFFERENTIAL/PLATELET
Abs Immature Granulocytes: 0.02 10*3/uL (ref 0.00–0.07)
Basophils Absolute: 0 10*3/uL (ref 0.0–0.1)
Basophils Relative: 1 %
Eosinophils Absolute: 0.4 10*3/uL (ref 0.0–0.5)
Eosinophils Relative: 8 %
HCT: 30.6 % — ABNORMAL LOW (ref 36.0–46.0)
Hemoglobin: 9.2 g/dL — ABNORMAL LOW (ref 12.0–15.0)
Immature Granulocytes: 0 %
Lymphocytes Relative: 28 %
Lymphs Abs: 1.5 10*3/uL (ref 0.7–4.0)
MCH: 24.1 pg — ABNORMAL LOW (ref 26.0–34.0)
MCHC: 30.1 g/dL (ref 30.0–36.0)
MCV: 80.3 fL (ref 80.0–100.0)
Monocytes Absolute: 0.5 10*3/uL (ref 0.1–1.0)
Monocytes Relative: 10 %
Neutro Abs: 2.9 10*3/uL (ref 1.7–7.7)
Neutrophils Relative %: 53 %
Platelets: 222 10*3/uL (ref 150–400)
RBC: 3.81 MIL/uL — ABNORMAL LOW (ref 3.87–5.11)
RDW: 19.2 % — ABNORMAL HIGH (ref 11.5–15.5)
WBC: 5.3 10*3/uL (ref 4.0–10.5)
nRBC: 0 % (ref 0.0–0.2)

## 2019-12-06 LAB — URINALYSIS, ROUTINE W REFLEX MICROSCOPIC
Bilirubin Urine: NEGATIVE
Glucose, UA: NEGATIVE mg/dL
Hgb urine dipstick: NEGATIVE
Ketones, ur: NEGATIVE mg/dL
Leukocytes,Ua: NEGATIVE
Nitrite: NEGATIVE
Protein, ur: NEGATIVE mg/dL
Specific Gravity, Urine: 1.005 (ref 1.005–1.030)
pH: 8 (ref 5.0–8.0)

## 2019-12-06 LAB — COMPREHENSIVE METABOLIC PANEL
ALT: 31 U/L (ref 0–44)
AST: 57 U/L — ABNORMAL HIGH (ref 15–41)
Albumin: 3.4 g/dL — ABNORMAL LOW (ref 3.5–5.0)
Alkaline Phosphatase: 104 U/L (ref 38–126)
Anion gap: 11 (ref 5–15)
BUN: 12 mg/dL (ref 8–23)
CO2: 25 mmol/L (ref 22–32)
Calcium: 8.9 mg/dL (ref 8.9–10.3)
Chloride: 98 mmol/L (ref 98–111)
Creatinine, Ser: 0.82 mg/dL (ref 0.44–1.00)
GFR calc Af Amer: >60 mL/min (ref >60)
GFR calc non Af Amer: >60 mL/min (ref >60)
Glucose, Bld: 110 mg/dL — ABNORMAL HIGH (ref 70–99)
Potassium: 4 mmol/L (ref 3.5–5.1)
Sodium: 134 mmol/L — ABNORMAL LOW (ref 135–145)
Total Bilirubin: 0.8 mg/dL (ref 0.3–1.2)
Total Protein: 6.3 g/dL — ABNORMAL LOW (ref 6.5–8.1)

## 2019-12-06 LAB — SARS CORONAVIRUS 2 BY RT PCR (HOSPITAL ORDER, PERFORMED IN ~~LOC~~ HOSPITAL LAB): SARS Coronavirus 2: NEGATIVE

## 2019-12-06 LAB — TSH: TSH: 2.453 u[IU]/mL (ref 0.350–4.500)

## 2019-12-06 LAB — MAGNESIUM: Magnesium: 2.1 mg/dL (ref 1.7–2.4)

## 2019-12-06 MED ORDER — GABAPENTIN 100 MG PO CAPS
100.0000 mg | ORAL_CAPSULE | Freq: Three times a day (TID) | ORAL | Status: DC
  Administered 2019-12-06 – 2019-12-07 (×4): 100 mg via ORAL
  Filled 2019-12-06 (×4): qty 1

## 2019-12-06 MED ORDER — ATORVASTATIN CALCIUM 10 MG PO TABS
20.0000 mg | ORAL_TABLET | ORAL | Status: DC
  Administered 2019-12-07: 20 mg via ORAL
  Filled 2019-12-06: qty 1
  Filled 2019-12-06: qty 2

## 2019-12-06 MED ORDER — PANTOPRAZOLE SODIUM 40 MG PO TBEC
40.0000 mg | DELAYED_RELEASE_TABLET | Freq: Two times a day (BID) | ORAL | Status: DC
  Administered 2019-12-06 – 2019-12-07 (×2): 40 mg via ORAL

## 2019-12-06 MED ORDER — VENLAFAXINE HCL ER 75 MG PO CP24
75.0000 mg | ORAL_CAPSULE | Freq: Every day | ORAL | Status: DC
  Administered 2019-12-07: 75 mg via ORAL
  Filled 2019-12-06: qty 1

## 2019-12-06 MED ORDER — LEVOTHYROXINE SODIUM 50 MCG PO TABS: 50.0000 ug | ORAL_TABLET | ORAL | Status: DC

## 2019-12-06 MED ORDER — TIZANIDINE HCL 4 MG PO TABS
2.0000 mg | ORAL_TABLET | Freq: Every day | ORAL | Status: DC
  Administered 2019-12-06: 2 mg via ORAL
  Filled 2019-12-06: qty 1

## 2019-12-06 MED ORDER — NAPROXEN 375 MG PO TABS
375.0000 mg | ORAL_TABLET | Freq: Once | ORAL | Status: AC
  Administered 2019-12-06: 375 mg via ORAL
  Filled 2019-12-06: qty 1

## 2019-12-06 MED ORDER — CIPROFLOXACIN HCL 500 MG PO TABS
250.0000 mg | ORAL_TABLET | Freq: Two times a day (BID) | ORAL | Status: DC
  Administered 2019-12-06 – 2019-12-07 (×2): 250 mg via ORAL
  Filled 2019-12-06 (×2): qty 1

## 2019-12-06 NOTE — ED Notes (Signed)
Pt returned from MRI. Denies needs at this time

## 2019-12-06 NOTE — ED Notes (Signed)
Assisted pt to bathroom with the use of the walker. Pt provided with toothbrush and toothpaste. Pt brushed teeth. Pt assisted back to bed. Pt placed on purewick. Pt requested Aleve for knee pain. Dr. Sherry Ruffing made aware.

## 2019-12-06 NOTE — ED Notes (Signed)
Pt transported to MRI 

## 2019-12-06 NOTE — ED Notes (Signed)
Pt requested pain management for arthritis in hip. MD made aware

## 2019-12-06 NOTE — Progress Notes (Addendum)
CSW communicated with EDP who feels placement is appropriate if PT agrees.  Per the RN CM pt is agreeable to FL-2/referrals being sent out for SNF placement and agrees to initiate a rapid COVID test and states PT consult has already been placed.  CSW will continue to follow for D/C needs.  Alphonse Guild. Berthel Bagnall  MSW, LCSW, LCAS, CCS Transitions of Care Clinical Social Worker Care Coordination Department Ph: [phone]

## 2019-12-06 NOTE — ED Triage Notes (Addendum)
Patient has been having frequent falls since 11/26/19 and the last was 2 days ago. Patient states she is having near syncopal /syncopal episodes. Patient denies blood thinners. Patient states a history of anemia. Patient states she has hit her head approx 5 times

## 2019-12-06 NOTE — ED Notes (Signed)
Pt request aleve for arthritis pain in hips. Tegeler MD made aware

## 2019-12-06 NOTE — Progress Notes (Signed)
Per [place] MUST, pt has a PASRR #:  [id number]  CSW will continue to follow for D/C needs.  Alphonse Guild. Mailin Coglianese  MSW, LCSW, LCAS, CCS Transitions of Care Clinical Social Worker Care Coordination Department Ph: [phone]

## 2019-12-06 NOTE — ED Provider Notes (Signed)
3:13 PM Care assumed from Dr. Alvino Chapel.  At time of transfer care, patient is awaiting results of MRI head.  Plan of care will be to see what the MRI shows and if it is concerning for stroke, neurology will see the patient.  If there is no stroke, neurology do not feel they need to be involved.  Patient will then likely need to wait PT/OT recommendations and then have social work/case management see her for likely placement given the recurrent falls and fatigue as the hospitalist team told the previous provider that she did not meet inpatient or admission criteria at that time.  5:17 PM Patient's MRI does not show acute stroke.  Urinalysis does not show UTI.  X-ray showed possible pneumonia however patient had no respiratory symptoms and the hospitalist team did not feel this was a pneumonia for admission.  Due to the patient's continued falls, previous do not feel she was safe to discharge home.  PT consult was placed.  Plan of care will be to have the patient wait in the ED for PT to see to determine her level of care needs as she is not appropriate for admission at this time but is also not safe to go home based on her continued falls.  Anticipate follow-up on PT Rex and then care management/social work team will help with possible placement if needed.   We will order home meds and she can eat and drink while waiting for PT eval.  Case management and social team is aware that she will need evaluation after PT eval the morning to determine disposition and possible placement.  Patient given some Aleve which she requested for knee pain.  She has had success with in the past.  Was ordered.  Home medicines ordered.  Anticipate reassessment after PT eval's patient in the morning.      Jodi Colon, Jodi Allegra, MD 12/06/19 [phone]

## 2019-12-06 NOTE — Progress Notes (Signed)
Assessment completed. FL-2/referrals signed and sent out via the hub to [place] SNF facilities.  CSW will continue to follow for D/C needs.  Alphonse Guild. Bradden Tadros  MSW, LCSW, LCAS, CCS Transitions of Care Clinical Social Worker Care Coordination Department Ph: [phone]    '

## 2019-12-06 NOTE — TOC Initial Note (Signed)
Transition of Care [facility]) - Initial/Assessment Note    Patient Details  Name: Jodi Colon MRN: [id number] Date of Birth: July 16, 1929  Transition of Care [facility]) CM/SW Contact:    Erenest Rasher, RN Phone Number: [phone] 12/06/2019, 6:21 PM  Clinical Narrative:                 TOC CM spoke to pt at bedside. States she will consider SNF rehab. Wants to discuss with her children. States daughter and son lives near her home and will stay with if needed. She has RW at home. Gave permission to create FL2 and fax referral to SNF rehab in [place]. Pt prefers SNF in [place]. Waiting PT evaluation. Will need auth from [facility] for SNF.   Expected Discharge Plan: Skilled Nursing Facility Barriers to Discharge: Continued Medical Work up   Patient Goals and CMS Choice Patient states their goals for this hospitalization and ongoing recovery are:: i live alone and will consider rehab CMS Medicare.gov Compare Post Acute Care list provided to:: Patient Choice offered to / list presented to : Patient  Expected Discharge Plan and Services Expected Discharge Plan: [place] In-house Referral: Clinical Social Work Discharge Planning Services: CM Consult Post Acute Care Choice: [place] Living arrangements for the past 2 months: [place]                                      Prior Living Arrangements/Services Living arrangements for the past 2 months: Single Family Home Lives with:: Self Patient language and need for interpreter reviewed:: Yes        Need for Family Participation in Patient Care: Yes (Comment) Care giver support system in place?: Yes (comment) Current home services: DME (rolling walker, cane) Criminal Activity/Legal Involvement Pertinent to Current Situation/Hospitalization: No - Comment as needed  Activities of Daily Living      Permission Sought/Granted Permission sought to share information with  : Case Manager, PCP, Family Supports, Customer service manager Permission granted to share information with : Yes, Verbal Permission Granted  Share Information with NAME: Development worker, international aid  Permission granted to share info w AGENCY: SNF, [facility] granted to share info w Relationship: daughter  Permission granted to share info w Contact Information: [phone]  Emotional Assessment Appearance:: Appears stated age Attitude/Demeanor/Rapport: Engaged, Gracious Affect (typically observed): Accepting, Pleasant Orientation: : Oriented to Self, Oriented to Place, Oriented to  Time, Oriented to Situation   Psych Involvement: No (comment)  Admission diagnosis:  [place] Patient Active Problem List   Diagnosis Date Noted  . Status post total hip replacement, left 11/08/2017  . Umbilical hernia without obstruction and without gangrene 07/28/2016  . Restless leg 10/21/2015  . Urinary frequency 10/15/2015  . Dysuria 10/15/2015  . Bursitis, trochanteric 04/02/2015  . Trochanteric bursitis of right hip 04/02/2015  . Disease of thyroid gland 03/04/2015  . Acid reflux 03/04/2015  . Subarachnoid hemorrhage ([place]) 02/09/2015  . Degenerative arthritis of lumbar spine 01/29/2015  . DDD (degenerative disc disease), lumbar 01/29/2015  . Arthritis, degenerative 01/28/2014  . Infection with methicillin-resistant Staphylococcus aureus 01/28/2014  . BP (high blood pressure) 01/28/2014  . HLD (hyperlipidemia) 01/28/2014  . Carcinoma in situ, breast, ductal 01/28/2014  . Clinical depression 01/28/2014  . Aneurysm, cerebral 01/28/2014  . Intraductal carcinoma of breast 01/28/2014  . Lumbar canal stenosis 12/07/2013  . Neuritis or  radiculitis due to rupture of lumbar intervertebral disc 12/07/2013  . Cramp in lower leg 06/19/2013  . Hemorrhoids that prolapse with straining, but retract spontaneously 01/24/2013  . Scalp lesion 11/27/2012  . Rectal prolapse 11/27/2012  . Hemorrhoid  08/29/2012  . Screening breast examination 07/12/2012  . FOM (frequency of micturition) 02/28/2012  . Urge incontinence 02/28/2012  . History of neoplasm of bladder 02/28/2012  . Excessive urination at night 02/28/2012  . Bladder CA in situ 02/28/2012  . History of methicillin resistant Staphylococcus aureus infection 12/30/2011  . H/O infectious disease 12/30/2011  . Cutaneous ulcer ([place]) 11/26/2011  . Skin ulcer ([place]) 11/26/2011  . Colon polyp 11/11/2011  . Absolute anemia 11/11/2011  . Back pain, chronic 11/01/2011  . Abscess, gluteal, right 10/10/2011  . Abscess of buttock 10/10/2011  . Degenerative joint disease   . Cerebrovascular disease   . MRSA (methicillin resistant Staphylococcus aureus)   . Hyperlipidemia LDL goal < 100 04/02/2011  . Bladder cancer ([place]) 10/29/2009  . Malignant neoplasm of urinary bladder ([place]) 10/29/2009   PCP:  Tracie Harrier, MD Pharmacy:   [facility], [address] [place] [address] Phone: [phone] Fax: [place], [place] [address] [place] [address] Phone: [phone] Fax: [phone]     Social Determinants of Health (SDOH) Interventions    Readmission Risk Interventions No flowsheet data found.

## 2019-12-06 NOTE — NC FL2 (Signed)
[place] LEVEL OF CARE SCREENING TOOL     IDENTIFICATION  Patient Name: Jodi Colon Birthdate: [birth year] Sex: female Admission Date (Current Location): 12/06/2019  [facility] and [place] Number:  Herbalist and Address:  [facility],  [place] [address], [place]      Provider Number: [id number]  Attending Physician Name and Address:  Tegeler, [name], *  Relative Name and Phone Number:       Current Level of Care: Hospital Recommended Level of Care: [place] Prior Approval Number:    Date Approved/Denied:   PASRR Number: [id number]  Discharge Plan: SNF    Current Diagnoses: Patient Active Problem List   Diagnosis Date Noted  . Status post total hip replacement, left 11/08/2017  . Umbilical hernia without obstruction and without gangrene 07/28/2016  . Restless leg 10/21/2015  . Urinary frequency 10/15/2015  . Dysuria 10/15/2015  . Bursitis, trochanteric 04/02/2015  . Trochanteric bursitis of right hip 04/02/2015  . Disease of thyroid gland 03/04/2015  . Acid reflux 03/04/2015  . Subarachnoid hemorrhage ([place]) 02/09/2015  . Degenerative arthritis of lumbar spine 01/29/2015  . DDD (degenerative disc disease), lumbar 01/29/2015  . Arthritis, degenerative 01/28/2014  . Infection with methicillin-resistant Staphylococcus aureus 01/28/2014  . BP (high blood pressure) 01/28/2014  . HLD (hyperlipidemia) 01/28/2014  . Carcinoma in situ, breast, ductal 01/28/2014  . Clinical depression 01/28/2014  . Aneurysm, cerebral 01/28/2014  . Intraductal carcinoma of breast 01/28/2014  . Lumbar canal stenosis 12/07/2013  . Neuritis or radiculitis due to rupture of lumbar intervertebral disc 12/07/2013  . Cramp in lower leg 06/19/2013  . Hemorrhoids that prolapse with straining, but retract spontaneously 01/24/2013  . Scalp lesion 11/27/2012  . Rectal prolapse 11/27/2012  . Hemorrhoid 08/29/2012  . Screening breast  examination 07/12/2012  . FOM (frequency of micturition) 02/28/2012  . Urge incontinence 02/28/2012  . History of neoplasm of bladder 02/28/2012  . Excessive urination at night 02/28/2012  . Bladder CA in situ 02/28/2012  . History of methicillin resistant Staphylococcus aureus infection 12/30/2011  . H/O infectious disease 12/30/2011  . Cutaneous ulcer ([place]) 11/26/2011  . Skin ulcer ([place]) 11/26/2011  . Colon polyp 11/11/2011  . Absolute anemia 11/11/2011  . Back pain, chronic 11/01/2011  . Abscess, gluteal, right 10/10/2011  . Abscess of buttock 10/10/2011  . Degenerative joint disease   . Cerebrovascular disease   . MRSA (methicillin resistant Staphylococcus aureus)   . Hyperlipidemia LDL goal < 100 04/02/2011  . Bladder cancer ([place]) 10/29/2009  . Malignant neoplasm of urinary bladder ([facility]) 10/29/2009    Orientation RESPIRATION BLADDER Height & Weight     Self, Time, Situation, Place  Normal Incontinent Weight: 128 lb (58.1 kg) Height:  4\' 9"  (144.8 cm)  BEHAVIORAL SYMPTOMS/MOOD NEUROLOGICAL BOWEL NUTRITION STATUS      Incontinent Diet (Regular)  AMBULATORY STATUS COMMUNICATION OF NEEDS Skin   Extensive Assist Verbally Normal                       Personal Care Assistance Level of Assistance  Bathing, Dressing Bathing Assistance: Limited assistance   Dressing Assistance: Limited assistance     Functional Limitations Info             SPECIAL CARE FACTORS FREQUENCY  PT (By licensed PT), OT (By licensed OT)     PT Frequency: 5 OT Frequency: 5            Contractures  Additional Factors Info  Code Status, Allergies Code Status Info: FULL CODE Allergies Info: Penicillins, Sulfa Antibiotics, Sulfasalazine           Current Medications (12/06/2019):  This is the current hospital active medication list Current Facility-Administered Medications  Medication Dose Route Frequency Provider Last Rate Last Admin  . [START ON 12/07/2019] atorvastatin  (LIPITOR) tablet 20 mg  20 mg Oral [id number] Tegeler, [name], MD      . ciprofloxacin (CIPRO) tablet 250 mg  250 mg Oral BID Tegeler, [name], MD      . gabapentin (NEURONTIN) capsule 100 mg  100 mg Oral TID Tegeler, [name], MD   100 mg at 12/06/19 1806  . levothyroxine (SYNTHROID) tablet 50-100 mcg  50-100 mcg Oral See admin instructions Tegeler, [name], MD      . pantoprazole (PROTONIX) EC tablet 40 mg  40 mg Oral BID Tegeler, [name], MD      . tiZANidine (ZANAFLEX) tablet 2 mg  2 mg Oral QHS Tegeler, [name], MD      . Derrill Memo ON 12/07/2019] venlafaxine XR (EFFEXOR-XR) 24 hr capsule 75 mg  75 mg Oral Q breakfast Tegeler, [name], MD       Current Outpatient Medications  Medication Sig Dispense Refill  . atorvastatin (LIPITOR) 20 MG tablet Take 20 mg by mouth every morning.    . cetirizine (ZYRTEC) 10 MG tablet Take 10 mg by mouth daily as needed for allergies.    . ciprofloxacin (CIPRO) 250 MG tablet Take 250 mg by mouth 2 (two) times daily.    . cyanocobalamin 1000 MCG tablet Take 1,000 mcg by mouth daily.    Marland Kitchen erythromycin ophthalmic ointment Place 1 application into the right eye at bedtime.    . fluticasone (FLONASE) 50 MCG/ACT nasal spray Place 1 spray into both nostrils daily as needed for allergies or rhinitis.    Marland Kitchen gabapentin (NEURONTIN) 100 MG capsule Take 100 mg by mouth 3 (three) times daily.    . hydrocortisone 2.5 % cream Apply 1 application topically daily as needed (rash).    . Hypromellose (VISTA GEL DRY EYE RELIEF OP) Apply 1-2 drops to eye daily as needed (dry eyes).    . iron polysaccharides (NIFEREX) 150 MG capsule Take 150 mg by mouth daily.    Marland Kitchen levothyroxine (SYNTHROID, LEVOTHROID) 50 MCG tablet Take 50-100 mcg by mouth See admin instructions. Take 1 tablet (33mcg) by mouth daily in the morning before breakfast except Sunday. Take 2 tablets (124mcg) by mouth daily in the morning before breakfast on Sunday.    . loperamide  (IMODIUM) 2 MG capsule Take 2 mg by mouth 4 (four) times daily as needed for diarrhea or loose stools.    . Multiple Vitamin (MULTIVITAMIN) tablet Take 1 tablet by mouth daily.      . pantoprazole (PROTONIX) 40 MG tablet Take 40 mg by mouth 2 (two) times daily.     . prednisoLONE acetate (PRED FORTE) 1 % ophthalmic suspension Place 1 drop into the right eye See admin instructions. Place 1 drop into the right eye 8 times daily.    . Probiotic Product (PROBIOTIC PO) Take 1 capsule by mouth daily.    . timolol (TIMOPTIC) 0.5 % ophthalmic solution Place 1 drop into both eyes 2 (two) times daily.     Marland Kitchen tiZANidine (ZANAFLEX) 2 MG tablet Take 2 mg by mouth at bedtime.     Marland Kitchen venlafaxine XR (EFFEXOR-XR) 75 MG 24 hr capsule Take 75  mg by mouth daily with breakfast.     . aspirin 81 MG chewable tablet Chew 1 tablet (81 mg total) by mouth 2 (two) times daily. (Patient not taking: Reported on 12/06/2019) 30 tablet 0  . clindamycin (CLEOCIN) 300 MG capsule Take 1 capsule (300 mg total) by mouth 3 (three) times daily for 10 days. (Patient not taking: Reported on 12/06/2019) 30 capsule 0  . senna-docusate (SENOKOT-S) 8.6-50 MG tablet Take 1 tablet by mouth at bedtime as needed for mild constipation. (Patient not taking: Reported on 12/06/2019) 15 tablet 0   Facility-Administered Medications Ordered in Other Encounters  Medication Dose Route Frequency Provider Last Rate Last Admin  . gemcitabine (GEMZAR) 2,000 mg  2,000 mg Intravenous Once Lloyd Huger, MD      . opium-belladonna (B&O SUPPRETTES) 16.2-30 MG suppository 30 mg  30 mg Rectal Once Hollice Espy, MD      . opium-belladonna (B&O SUPPRETTES) 16.2-30 MG suppository 30 mg  30 mg Rectal Once Lloyd Huger, MD      . opium-belladonna (B&O SUPPRETTES) 16.2-30 MG suppository 30 mg  30 mg Rectal Once Lloyd Huger, MD      . Dema Severin Petrolatum OINT 1 application  1 application Apply externally Once Lloyd Huger, MD         Discharge  Medications: Please see discharge summary for a list of discharge medications.  Relevant Imaging Results:  Relevant Lab Results:   Additional Information [id number]  Alphonse Guild Toriana Sponsel, LCSW

## 2019-12-06 NOTE — ED Notes (Addendum)
Pts family request to speak to Tegeler MD. MD made aware.

## 2019-12-06 NOTE — ED Notes (Signed)
Pt brief changed and new purewick placed °

## 2019-12-06 NOTE — ED Notes (Signed)
Pt family requested iron levels to be checked and wants to have infusion done in the hospital. Tegeler MD made aware.

## 2019-12-06 NOTE — ED Provider Notes (Addendum)
[facility] DEPT Provider Note   CSN: [id number] Arrival date & time: 12/06/19  0720     History Chief Complaint  Patient presents with  . frequent falls    Jodi Colon is a 86 y.o. female.  HPI Patient presents with generalized weakness falls and urinary tract infection.  For last around 2 weeks patient has been having more falls and lightheadedness.  States she gets up and will fall down.  States she is fallen about 5 times and hit her head.  [place] and broke her finger.  Has been seen for that by hand surgery and had been seen in the ER.  Also had been on clindamycin then followed by Cipro for both finger infection and UTI.  States she feels that she still has a urinary tract infection.  Does have a history of bladder cancer also.  Was seen about 10 days ago at [facility] for a fall.  Does not have headaches.  No chest pain.  Lives at home but has help there at times.Patient also reportedly has low iron and potentially will be getting infusions.    Past Medical History:  Diagnosis Date  . Adnexal cyst   . Arthritis   . Bladder cancer ([place]) 04/2015  . Brain aneurysm    small, followed with regular angiograms, Dr. Estanislado Pandy  . Breast cancer [facility]) 2007   DCIS-Right, s/p lumpectomy/xrt  . Cerebrovascular disease 2008   s/p PTCA/stent of MCA, [facility]  . Chronic kidney disease   . Complication of anesthesia    2003- trouble waking up after fracture surgery, no issues with anesthesia since  . DDD (degenerative disc disease), cervical   . Degenerative joint disease    managed by the [facility]  . GERD (gastroesophageal reflux disease)   . Headache   . History of hiatal hernia   . History of MRSA infection   . History of TIA (transient ischemic attack)   . Hx MRSA infection   . Hyperlipidemia   . Hypertension   . Hypothyroidism   . Incomplete bladder emptying   . 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 ([place])   . MRSA  (methicillin resistant Staphylococcus aureus) 2008   perineal abscess  . Nocturia   . Pneumonia   . Radiation 2007   BREAST CA  . Spinal stenosis   . Stroke ([place])   . Thyroid disease   . Urge incontinence     Patient Active Problem List   Diagnosis Date Noted  . Status post total hip replacement, left 11/08/2017  . Umbilical hernia without obstruction and without gangrene 07/28/2016  . Restless leg 10/21/2015  . Urinary frequency 10/15/2015  . Dysuria 10/15/2015  . Bursitis, trochanteric 04/02/2015  . Trochanteric bursitis of right hip 04/02/2015  . Disease of thyroid gland 03/04/2015  . Acid reflux 03/04/2015  . Subarachnoid hemorrhage ([place]) 02/09/2015  . Degenerative arthritis of lumbar spine 01/29/2015  . DDD (degenerative disc disease), lumbar 01/29/2015  . Arthritis, degenerative 01/28/2014  . Infection with methicillin-resistant Staphylococcus aureus 01/28/2014  . BP (high blood pressure) 01/28/2014  . HLD (hyperlipidemia) 01/28/2014  . Carcinoma in situ, breast, ductal 01/28/2014  . Clinical depression 01/28/2014  . Aneurysm, cerebral 01/28/2014  . Intraductal carcinoma of breast 01/28/2014  . Lumbar canal stenosis 12/07/2013  . Neuritis or radiculitis due to rupture of lumbar intervertebral disc 12/07/2013  . Cramp in lower leg 06/19/2013  . Hemorrhoids that prolapse with straining, but retract spontaneously 01/24/2013  .  Scalp lesion 11/27/2012  . Rectal prolapse 11/27/2012  . Hemorrhoid 08/29/2012  . Screening breast examination 07/12/2012  . FOM (frequency of micturition) 02/28/2012  . Urge incontinence 02/28/2012  . History of neoplasm of bladder 02/28/2012  . Excessive urination at night 02/28/2012  . Bladder CA in situ 02/28/2012  . History of methicillin resistant Staphylococcus aureus infection 12/30/2011  . H/O infectious disease 12/30/2011  . Cutaneous ulcer ([place]) 11/26/2011  . Skin ulcer ([place]) 11/26/2011  . Colon polyp 11/11/2011  . Absolute  anemia 11/11/2011  . Back pain, chronic 11/01/2011  . Abscess, gluteal, right 10/10/2011  . Abscess of buttock 10/10/2011  . Degenerative joint disease   . Cerebrovascular disease   . MRSA (methicillin resistant Staphylococcus aureus)   . Hyperlipidemia LDL goal < 100 04/02/2011  . Bladder cancer ([place]) 10/29/2009  . Malignant neoplasm of urinary bladder ([place]) 10/29/2009    Past Surgical History:  Procedure Laterality Date  . ABDOMINAL HYSTERECTOMY    . AQUEOUS SHUNT Left 01/05/2016   Procedure: AQUEOUS SHUNT;  Surgeon: Ronnell Freshwater, MD;  Location: [facility];  Service: Ophthalmology;  Laterality: Left;  AHMED VALVE WITH SCLERAL PATCH GRAFT  . AQUEOUS SHUNT Right 07/19/2016   Procedure: AQUEOUS SHUNT;  Surgeon: Ronnell Freshwater, MD;  Location: [facility];  Service: Ophthalmology;  Laterality: Right;  Ahmed tube shunt  . BACK SURGERY    . BLADDER SURGERY  2012, 2014  . BRAIN SURGERY Right 2008   stent, [facility] [place]  . BREAST LUMPECTOMY Right 2007   DCIS, Lumpectomy F/U radiation   . CARPAL TUNNEL RELEASE Left 07/2017  . CATARACT EXTRACTION    . CHOLECYSTECTOMY    . COLONOSCOPY  2013  . CYSTOSCOPY W/ RETROGRADES Bilateral 05/20/2015   Procedure: CYSTOSCOPY WITH RETROGRADE PYELOGRAM;  Surgeon: Hollice Espy, MD;  Location: [facility];  Service: Urology;  Laterality: Bilateral;  . CYSTOSCOPY W/ RETROGRADES Bilateral 12/24/2015   Procedure: CYSTOSCOPY WITH RETROGRADE PYELOGRAM;  Surgeon: Hollice Espy, MD;  Location: [facility];  Service: Urology;  Laterality: Bilateral;  . CYSTOSCOPY WITH BIOPSY N/A 05/20/2015   Procedure: CYSTOSCOPY WITH BIOPSY;  Surgeon: Hollice Espy, MD;  Location: [facility];  Service: Urology;  Laterality: N/A;  . CYSTOSCOPY WITH BIOPSY N/A 12/24/2015   Procedure: CYSTOSCOPY WITH BIOPSY;  Surgeon: Hollice Espy, MD;  Location: [facility];  Service: Urology;  Laterality: N/A;  . EYE SURGERY Bilateral    Cataract Extraction,  glaucoma surgery 2018, 2019  . FRACTURE SURGERY Left    Ankle, compound fracture  . HEMORRHOID SURGERY  02/01/13  . LAPAROSCOPIC HYSTERECTOMY    . TONSILLECTOMY    . TOTAL HIP ARTHROPLASTY Left 11/08/2017   Procedure: TOTAL HIP ARTHROPLASTY ANTERIOR APPROACH;  Surgeon: Hessie Knows, MD;  Location: [facility];  Service: Orthopedics;  Laterality: Left;     OB History   No obstetric history on file.     Family History  Problem Relation Age of Onset  . Fibromyalgia Daughter   . Breast cancer Neg Hx     Social History   Tobacco Use  . Smoking status: Never Smoker  . Smokeless tobacco: Never Used  Vaping Use  . Vaping Use: Never used  Substance Use Topics  . Alcohol use: No  . Drug use: No    Home Medications Prior to Admission medications   Medication Sig Start Date End Date Taking? Authorizing Provider  atorvastatin (LIPITOR) 20 MG tablet Take 20 mg by mouth every morning.   Yes [provider]  cetirizine (ZYRTEC) 10 MG tablet Take 10 mg by mouth daily as needed for allergies.   Yes [provider]  ciprofloxacin (CIPRO) 250 MG tablet Take 250 mg by mouth 2 (two) times daily. 12/04/19  Yes [provider]  cyanocobalamin 1000 MCG tablet Take 1,000 mcg by mouth daily. 08/07/18  Yes [provider]  erythromycin ophthalmic ointment Place 1 application into the right eye at bedtime.   Yes [provider]  fluticasone (FLONASE) 50 MCG/ACT nasal spray Place 1 spray into both nostrils daily as needed for allergies or rhinitis.   Yes [provider]  gabapentin (NEURONTIN) 100 MG capsule Take 100 mg by mouth 3 (three) times daily. 11/28/19  Yes [provider]  hydrocortisone 2.5 % cream Apply 1 application topically daily as needed (rash).   Yes [provider]  Hypromellose (VISTA GEL DRY EYE RELIEF OP) Apply 1-2 drops to eye daily as needed (dry eyes).   Yes [provider]  iron polysaccharides (NIFEREX)  150 MG capsule Take 150 mg by mouth daily. 09/17/19  Yes [provider]  levothyroxine (SYNTHROID, LEVOTHROID) 50 MCG tablet Take 50-100 mcg by mouth See admin instructions. Take 1 tablet (17mcg) by mouth daily in the morning before breakfast except Sunday. Take 2 tablets (135mcg) by mouth daily in the morning before breakfast on Sunday. 11/23/13  Yes [provider]  loperamide (IMODIUM) 2 MG capsule Take 2 mg by mouth 4 (four) times daily as needed for diarrhea or loose stools.   Yes [provider]  Multiple Vitamin (MULTIVITAMIN) tablet Take 1 tablet by mouth daily.     Yes [provider]  pantoprazole (PROTONIX) 40 MG tablet Take 40 mg by mouth 2 (two) times daily.  06/19/13  Yes [provider]  prednisoLONE acetate (PRED FORTE) 1 % ophthalmic suspension Place 1 drop into the right eye See admin instructions. Place 1 drop into the right eye 8 times daily. 11/15/19  Yes [provider]  Probiotic Product (PROBIOTIC PO) Take 1 capsule by mouth daily.   Yes [provider]  timolol (TIMOPTIC) 0.5 % ophthalmic solution Place 1 drop into both eyes 2 (two) times daily.  10/22/15  Yes [provider]  tiZANidine (ZANAFLEX) 2 MG tablet Take 2 mg by mouth at bedtime.  04/02/15  Yes [provider]  venlafaxine XR (EFFEXOR-XR) 75 MG 24 hr capsule Take 75 mg by mouth daily with breakfast.    Yes [provider]  aspirin 81 MG chewable tablet Chew 1 tablet (81 mg total) by mouth 2 (two) times daily. Patient not taking: Reported on 12/06/2019 11/10/17   Duanne Guess, PA-C  clindamycin (CLEOCIN) 300 MG capsule Take 1 capsule (300 mg total) by mouth 3 (three) times daily for 10 days. Patient not taking: Reported on 12/06/2019 11/26/19 12/06/19  Laban Emperor, PA-C  senna-docusate (SENOKOT-S) 8.6-50 MG tablet Take 1 tablet by mouth at bedtime as needed for mild constipation. Patient not taking: Reported on 12/06/2019 11/09/17    Duanne Guess, PA-C    Allergies    Penicillins, Sulfa antibiotics, and Sulfasalazine  Review of Systems   Review of Systems  Constitutional: Positive for fatigue. Negative for appetite change.  Respiratory: Negative for shortness of breath.   Gastrointestinal: Negative for abdominal pain.  Genitourinary: Positive for dysuria.  Musculoskeletal: Negative for back pain.  Skin: Negative for rash.  Neurological: Positive for light-headedness.  Hematological: Bruises/bleeds easily.    Physical Exam Updated  Vital Signs BP (!) 156/70   Pulse 84   Temp 97.7 F (36.5 C) (Oral)   Resp 17   Ht 4\' 9"  (1.448 m)   Wt 58.1 kg   SpO2 97%   BMI 27.70 kg/m   Physical Exam Vitals reviewed.  Constitutional:      Appearance: Normal appearance.  HENT:     Head: Atraumatic.     Comments: Mild ecchymosis over right zygomatic arch.  Mild bruising without underlying bony tenderness.  Patient states the bruises been there for almost a month. Eyes:     Extraocular Movements: Extraocular movements intact.  Cardiovascular:     Rate and Rhythm: Regular rhythm.  Pulmonary:     Breath sounds: No wheezing or rhonchi.  Abdominal:     Tenderness: There is no abdominal tenderness.  Musculoskeletal:     Cervical back: Neck supple.     Comments: Ring finger on right hand wrapped with distal tenderness.  Skin:    Capillary Refill: Capillary refill takes less than 2 seconds.  Neurological:     Mental Status: She is alert and oriented to person, place, and time.     Comments: Moves all extremities.  Occasional twitching right face which is reportedly chronic.     ED Results / Procedures / Treatments   Labs (all labs ordered are listed, but only abnormal results are displayed) Labs Reviewed  COMPREHENSIVE METABOLIC PANEL - Abnormal; Notable for the following components:      Result Value   Sodium 134 (*)    Glucose, Bld 110 (*)    Total Protein 6.3 (*)    Albumin 3.4 (*)    AST 57 (*)     All other components within normal limits  CBC WITH DIFFERENTIAL/PLATELET - Abnormal; Notable for the following components:   RBC 3.81 (*)    Hemoglobin 9.2 (*)    HCT 30.6 (*)    MCH 24.1 (*)    RDW 19.2 (*)    All other components within normal limits  URINE CULTURE  URINALYSIS, ROUTINE W REFLEX MICROSCOPIC    EKG None  Radiology CT Head Wo Contrast  Result Date: 12/06/2019 CLINICAL DATA:  Multiple falls, head injury, presyncope EXAM: CT HEAD WITHOUT CONTRAST TECHNIQUE: Contiguous axial images were obtained from the base of the skull through the vertex without intravenous contrast. COMPARISON:  11/26/2019 FINDINGS: Brain: There is normal anatomic configuration of the brain. Mild parenchymal volume loss is commensurate with the patient's age. Moderate periventricular white matter changes are present likely reflecting the sequela of small vessel ischemia. Remote lacunar infarcts are noted within the anterior limb of the right internal capsule and left lentiform nucleus. There is no evidence of acute intracranial hemorrhage or infarct. There is no abnormal intra or extra-axial mass lesion or fluid collection. No abnormal mass effect or midline shift. The ventricular size is normal. The cerebellum is unremarkable. Vascular: Intravascular stent is seen within the right M2 segment. Moderate atherosclerotic calcification is noted within the carotid siphons. Skull: The calvarium is intact. Sinuses/Orbits: Episcleral shunts are noted within the orbits bilaterally. The orbits are otherwise unremarkable. The paranasal sinuses are clear. Other: Mastoid air cells and middle ear cavities are clear. IMPRESSION: Stable senescent changes and remote lacunar infarcts. No evidence of acute intracranial hemorrhage or infarct. Right M2 intracranial stent again noted. Electronically Signed   By: Fidela Salisbury MD   On: 12/06/2019 09:45   DG Chest Portable 1 View  Result Date: 12/06/2019 CLINICAL DATA:  Weakness.  EXAM: PORTABLE CHEST 1 VIEW COMPARISON:  02/10/2015. FINDINGS: Mediastinum hilar structures normal. Heart size normal. Mild right base subsegmental atelectasis/infiltrate. No pleural effusion or pneumothorax. Calcified densities in the right breast most likely calcified fibroadenomas. Old left rib fractures noted. IMPRESSION: Mild right base subsegmental atelectasis/infiltrate. Electronically Signed   By: Marcello Moores  Register   On: 12/06/2019 08:45    Procedures Procedures (including critical care time)  Medications Ordered in ED Medications - No data to display  ED Course  I have reviewed the triage vital signs and the nursing notes.  Pertinent labs & imaging results that were available during my care of the patient were reviewed by me and considered in my medical decision making (see chart for details).    MDM Rules/Calculators/A&P                          Patient presents with generalized weakness and recurrent falls.  States she stands up and will fall over.  Has had some work-up by PCP but continuing symptoms.  Has currently been treated for UTI however urine reassuring here.  X-ray here shows infiltrate versus atelectasis but no cough or shortness of breath.  Does have a mildly worsening anemia.  Hemoglobin recently has been 10 but now down to 9.2.  No blood in the stool.  Patient has been told that she may need iron transfusions.  Has been currently treated for UTI and states she is still having burning and feels as if she has an infection but urine reassuring.  However patient has not been able to ambulate safely at home.  Does not have home health or other resources although does have family members with her at times.  I feels the patient benefit from admission to the hospital for further work-up including likely PT OT evaluation and management/following of the anemia.  Discussed with Dr. Jamse Arn from hospitalist.  She feels that patient may not have criteria for admission at this time.   With the frequent falls I thought it was also potentially could be is more of a central cause such as a stroke that could give the unsteadiness.  She recommends neurology consult in the ER.  We will also get PT eval.  Discussed with Dr. Leonel Ramsay from neurology.  Recommends MRI in the ER.  If MRI is negative for stroke does not need inpatient work-up for central cause. Care will be turned over to oncoming provider. Final Clinical Impression(s) / ED Diagnoses Final diagnoses:  Anemia, unspecified type  Recurrent falls  Weakness    Rx / DC Orders ED Discharge Orders    None       Davonna Belling, MD 12/06/19 1203    Davonna Belling, MD 12/06/19 1210    Davonna Belling, MD 12/06/19 1323

## 2019-12-07 LAB — URINE CULTURE: Culture: NO GROWTH

## 2019-12-07 MED ORDER — ACETAMINOPHEN 325 MG PO TABS
650.0000 mg | ORAL_TABLET | Freq: Once | ORAL | Status: AC
  Administered 2019-12-07: 650 mg via ORAL
  Filled 2019-12-07: qty 2

## 2019-12-07 NOTE — ED Notes (Signed)
Pt provided with lunch tray. No other needs expressed. Family at bedside, will continue to monitor.

## 2019-12-07 NOTE — ED Notes (Signed)
PT at bedside.

## 2019-12-07 NOTE — Social Work (Signed)
CSW attempting to follow up at present time.  Began SNF authorization with [facility] [employer] [phone].  [facility] reference # [id number].   CSW will continue to follow for dc needs.  Nell Gales Tarpley-Carter, MSW, [facility] ED Transitions of Engineer, building services Health [phone]

## 2019-12-07 NOTE — Discharge Instructions (Addendum)
Follow up with your doctor   Home health will follow up with you regarding services you will qualify for   Fall precautions   Return to ER if you have another fall, vomiting, worse weakness, fever

## 2019-12-07 NOTE — Social Work (Signed)
CSW faxed information to [facility] [phone] to complete the request for authorization.  Faxed Covid results and pt evaluation to [facility] [phone].  CSW will continue to follow for dc needs.  Bellina Tokarczyk Tarpley-Carter, MSW, [place] ED Transitions of Engineer, building services Health [phone]

## 2019-12-07 NOTE — Care Management (Signed)
[facility] ED CM received call from [facility] ED CSW concerning patient electing to go home with [facility] services.  CM noted that patient has [facility] Medicare as payor source CM will send referral to several [facility] agencies to attempt ensure that patient's [facility] referral is accepted and patient received the services. CM will fax referral to [place], and  via [facility]  Updated Dr. Darl Householder that I will have to follow up due limited agencies accepting [facility] Medicare  For [facility] services.

## 2019-12-07 NOTE — Evaluation (Signed)
Physical Therapy Evaluation Patient Details Name: Jodi Colon MRN: [id number] DOB: 1930/02/14 Today's Date: 12/07/2019   History of Present Illness  Pt is 86 yo female who presents with recent falls and lightheadedness over the past 2 weeks.  Pt had a recent UTI that has now resolved.  MRI was negative for acute changes. Pt with PMH of small brain aneurysm, breast CA, DDD, GERD, bladderCA, HTN, see H and P for full PMH.  Clinical Impression  Pt admitted with above diagnosis.  PT eval was completed in the ED.  Pt with recent hx of falls (past 2 weeks) that occur at night when she first stands.  Pt demonstrating safe gait and transfers today - falls are not balance or strength or safety related.  Vestibular testing was negative.  Orthostatic Bps were negative - but question if positive during the night as pt reports she gets up quickly and is drinking less water.  Additionally, pt reports that she was weaning off an antidepressant but then forgot to wean and abruptly stopped taking - also over the past 2 weeks.  Provided education of recommendations, safety, and PT findings (see below)  . Do recommend new RW as pt reports hers does not work well.  However, no further PT needs.  Do not feel pt would benefit from SNF as falls do not appear to be related to strength or balance.      Follow Up Recommendations No PT follow up;Other (comment) (supervision at night)    Equipment Recommendations  Rolling walker with 5" wheels    Recommendations for Other Services       Precautions / Restrictions Precautions Precautions: None      Mobility  Bed Mobility Overal bed mobility: Needs Assistance Bed Mobility: Supine to Sit;Sit to Supine     Supine to sit: Supervision;HOB elevated Sit to supine: Supervision;HOB elevated   General bed mobility comments: use of bed rail; has rails and liftable bed at home  Transfers Overall transfer level: Needs assistance Equipment used: Rolling walker (2  wheeled) Transfers: Sit to/from Stand Sit to Stand: Min guard         General transfer comment: min guard for safety; performed x 3  Ambulation/Gait Ambulation/Gait assistance: Supervision Gait Distance (Feet): 300 Feet Assistive device: Rolling walker (2 wheeled) Gait Pattern/deviations: WFL(Within Functional Limits) Gait velocity: normal   General Gait Details: Pt able to complete gait safely and steady.  She was able to make turns, stops, navigate around objects, and perform head turns without LOB  Science writer    Modified Rankin (Stroke Patients Only)       Balance Overall balance assessment: Needs assistance Sitting-balance support: No upper extremity supported;Feet supported Sitting balance-Leahy Scale: Good     Standing balance support: No upper extremity supported Standing balance-Leahy Scale: Fair Standing balance comment: able to maintain standing without UE support but prefers to hold RW                             Pertinent Vitals/Pain Pain Assessment: No/denies pain    Home Living Family/patient expects to be discharged to:: Private residence Living Arrangements: Alone Available Help at Discharge: Family;Available PRN/intermittently (3 children close by) Type of Home: House Home Access: Level entry     Home Layout: One level Home Equipment: Cane - single point;Walker - 2 wheels;Bedside commode;Grab bars - tub/shower  Prior Function Level of Independence: Independent         Comments: Up until the past few weeks pt was completely independent with ADLs, IADLs, and driving.  Did not use AD in house, used cane for community.  Over past few weeks reports multiple falls that typically occure when she first stands up and gets a "feeling" that she is going to fall.     Hand Dominance        Extremity/Trunk Assessment   Upper Extremity Assessment Upper Extremity Assessment: Overall WFL for tasks  assessed    Lower Extremity Assessment Lower Extremity Assessment: Overall WFL for tasks assessed    Cervical / Trunk Assessment Cervical / Trunk Assessment: Kyphotic  Communication   Communication: No difficulties  Cognition Arousal/Alertness: Awake/alert Behavior During Therapy: WFL for tasks assessed/performed Overall Cognitive Status: Within Functional Limits for tasks assessed                                        General Comments  History/subjective of recent falls: Pt reports increased falls over the past 2 weeks.  States that all falls have occurred at night and when she first stands up.  States that she just "gets the feeling she is going to fall" and then goes backwards.  Reports that during the day she sits at EOB longer before getting up but during the night she is in a hurry to get to the bathroom.  She did report one instance when she laid down she felt the bed was moving.  Additionally, daughter reports that pt was supposed to be weaning off her anti-depressant but that they had forgotten it and she abruptly just stopped taking it - this has been over the past 2 weeks.   Objective:  Orthostatic Bps - pt was asymptomatic with transfers today  Supine 129/47 and HR 88  Sitting 138/63 and HR 89     Standing 159/63 and HR 86  Standing after 3 min 157/62 and HR 82    Vestibular : EOEM, gaze stabilization, head turns: all negative nystagmus and negative dizziness [employer] and Roll Test for BPPV: all negative  Education/Recommendations:  Discussed with pt and daughter PT findings that balance and mobility are good.  Falls seem to be occurring all at night with first standing and are not due to poor balance, weakness, or tripping.  Vestibular testing was negative.  Orthostatic BP was negative, but that is consistent with pt's reports as her falls are only occurring during the night.  Discussed question if pt is getting orthostatic at night due to  getting up quicker and also not drinking as much water.  Also, question if medication related due to stopping anti-depressant abruptly over the same time frame as falls - encouraged to discuss with MD and notified RN.   Advised use of RW as that is what she used today and did well.  Pt reports her walker is old and big, placed recommendation for new RW.  Also, advised assist with mobility at night, sitting at EOB for several minutes before attempting to get to [facility] at night, and drinking water at night time (as long as no fluid restrictions per medical team).  Pt and daughter agree.  Did not recommend SNF at this time due to falls are not balance/strength related.        Exercises     Assessment/Plan  PT Assessment Patent does not need any further PT services  PT Problem List         PT Treatment Interventions      PT Goals (Current goals can be found in the Care Plan section)  Acute Rehab PT Goals Patient Stated Goal: agreeable to plan to return home with supervision at night PT Goal Formulation: All assessment and education complete, DC therapy Time For Goal Achievement: 12/07/19 Potential to Achieve Goals: Good    Frequency     Barriers to discharge        Co-evaluation               AM-PAC PT "6 Clicks" Mobility  Outcome Measure Help needed turning from your back to your side while in a flat bed without using bedrails?: None Help needed moving from lying on your back to sitting on the side of a flat bed without using bedrails?: A Little Help needed moving to and from a bed to a chair (including a wheelchair)?: None Help needed standing up from a chair using your arms (e.g., wheelchair or bedside chair)?: None Help needed to walk in hospital room?: None Help needed climbing 3-5 steps with a railing? : None 6 Click Score: 23    End of Session Equipment Utilized During Treatment: Gait belt Activity Tolerance: Patient tolerated treatment well Patient left: in  bed;with call bell/phone within reach;with family/visitor present Nurse Communication: Mobility status;Other (comment) (recommendations, negative orthostatic bp, and pt reports stopping antidepressant suddenly over past 2 weeks)      Time: 0786-7544 PT Time Calculation (min) (ACUTE ONLY): 47 min   Charges:   PT Evaluation $PT Eval Moderate Complexity: 1 Mod PT Treatments $Therapeutic Activity: 8-22 mins        Abran Richard, PT Acute Rehab Services Pager [phone] [facility] [phone]    Karlton Lemon 12/07/2019, 11:21 AM

## 2019-12-07 NOTE — ED Notes (Signed)
EDP Darl Householder made aware that pt requesting to be discharged home, no longer wants SNF placement.

## 2019-12-07 NOTE — Social Work (Addendum)
CSW will continue to follow pt until dc'd.  Currently, awaiting:  Authorization from [facility]   [employer], MSW, [place] [facility] [place] [phone]

## 2019-12-17 ENCOUNTER — Telehealth (HOSPITAL_COMMUNITY): Payer: Self-pay

## 2019-12-17 DIAGNOSIS — R531 Weakness: Secondary | ICD-10-CM | POA: Insufficient documentation

## 2019-12-17 DIAGNOSIS — R569 Unspecified convulsions: Secondary | ICD-10-CM | POA: Insufficient documentation

## 2019-12-17 DIAGNOSIS — R296 Repeated falls: Secondary | ICD-10-CM | POA: Insufficient documentation

## 2019-12-17 NOTE — Telephone Encounter (Signed)
Returned daughter's phone call. She wanted Deveshwar to look at pt's recent imaging. He looked and recommends for pt to come in for a f/u diagnostic angiogram. She was due back in 2018. Daughter will speak with physician today and call back if they decide to schedule. AW

## 2019-12-19 ENCOUNTER — Other Ambulatory Visit (HOSPITAL_COMMUNITY): Payer: Self-pay | Admitting: Acute Care

## 2019-12-19 ENCOUNTER — Other Ambulatory Visit: Payer: Self-pay | Admitting: Acute Care

## 2019-12-19 DIAGNOSIS — I729 Aneurysm of unspecified site: Secondary | ICD-10-CM

## 2019-12-24 ENCOUNTER — Other Ambulatory Visit: Payer: Self-pay

## 2019-12-24 ENCOUNTER — Ambulatory Visit
Admission: RE | Admit: 2019-12-24 | Discharge: 2019-12-24 | Disposition: A | Discharge status: Home / Self Care | Payer: Medicare Other | Source: Ambulatory Visit | Attending: Acute Care | Admitting: Acute Care

## 2019-12-24 DIAGNOSIS — I720 Aneurysm of carotid artery: Secondary | ICD-10-CM | POA: Insufficient documentation

## 2019-12-24 DIAGNOSIS — I729 Aneurysm of unspecified site: Secondary | ICD-10-CM | POA: Diagnosis present

## 2019-12-25 ENCOUNTER — Ambulatory Visit: Payer: Medicare Other | Attending: Internal Medicine

## 2019-12-25 DIAGNOSIS — I10 Essential (primary) hypertension: Secondary | ICD-10-CM | POA: Diagnosis not present

## 2019-12-25 DIAGNOSIS — R400 Somnolence: Secondary | ICD-10-CM | POA: Insufficient documentation

## 2019-12-25 DIAGNOSIS — G4733 Obstructive sleep apnea (adult) (pediatric): Secondary | ICD-10-CM | POA: Insufficient documentation

## 2019-12-25 DIAGNOSIS — G4761 Periodic limb movement disorder: Secondary | ICD-10-CM | POA: Diagnosis not present

## 2019-12-27 ENCOUNTER — Other Ambulatory Visit: Payer: Self-pay

## 2020-01-11 ENCOUNTER — Other Ambulatory Visit: Payer: Self-pay

## 2020-01-11 ENCOUNTER — Ambulatory Visit (INDEPENDENT_AMBULATORY_CARE_PROVIDER_SITE_OTHER): Payer: Medicare Other | Admitting: Podiatry

## 2020-01-11 ENCOUNTER — Other Ambulatory Visit: Payer: Self-pay | Admitting: Podiatry

## 2020-01-11 ENCOUNTER — Ambulatory Visit (INDEPENDENT_AMBULATORY_CARE_PROVIDER_SITE_OTHER): Payer: Medicare Other

## 2020-01-11 DIAGNOSIS — M79674 Pain in right toe(s): Secondary | ICD-10-CM | POA: Diagnosis not present

## 2020-01-11 DIAGNOSIS — S9032XA Contusion of left foot, initial encounter: Secondary | ICD-10-CM

## 2020-01-11 DIAGNOSIS — B351 Tinea unguium: Secondary | ICD-10-CM | POA: Diagnosis not present

## 2020-01-11 DIAGNOSIS — M778 Other enthesopathies, not elsewhere classified: Secondary | ICD-10-CM | POA: Diagnosis not present

## 2020-01-11 DIAGNOSIS — S81801A Unspecified open wound, right lower leg, initial encounter: Secondary | ICD-10-CM

## 2020-01-11 DIAGNOSIS — M79675 Pain in left toe(s): Secondary | ICD-10-CM

## 2020-01-11 MED ORDER — TRAMADOL HCL 50 MG PO TABS: 50.0000 mg | ORAL_TABLET | Freq: Four times a day (QID) | ORAL | 0 refills | Status: AC | PRN

## 2020-01-11 MED ORDER — GENTAMICIN SULFATE 0.1 % EX CREA
1.0000 | TOPICAL_CREAM | Freq: Two times a day (BID) | CUTANEOUS | 1 refills | Status: DC
Start: 2020-01-11 — End: 2020-04-04

## 2020-01-11 NOTE — Progress Notes (Signed)
HPI: 86 y.o. female presenting today as a new patient for evaluation of left foot pain.  Patient states that she has had multiple episodes of falling over the past 2 weeks and she has left foot pain.  She has been seen at the urgent care and no fracture was identified.  She presents today for follow-up treatment and evaluation. She is also requesting a nail trim today.  Patient is unable to trim her own nails.  They are elongated and thickened painful with shoes.  She presents for further treatment and evaluation  Past Medical History:  Diagnosis Date  . Adnexal cyst   . Arthritis   . Bladder cancer ([place]) 04/2015  . Brain aneurysm    small, followed with regular angiograms, Dr. Estanislado Pandy  . Breast cancer [facility]) 2007   DCIS-Right, s/p lumpectomy/xrt  . Cerebrovascular disease 2008   s/p PTCA/stent of MCA, Deveshwar  . Chronic kidney disease   . Complication of anesthesia    2003- trouble waking up after fracture surgery, no issues with anesthesia since  . DDD (degenerative disc disease), cervical   . Degenerative joint disease    managed by the [facility]  . GERD (gastroesophageal reflux disease)   . Headache   . History of hiatal hernia   . History of MRSA infection   . History of TIA (transient ischemic attack)   . Hx MRSA infection   . Hyperlipidemia   . Hypertension   . Hypothyroidism   . Incomplete bladder emptying   . 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 MRSA (methicillin resistant Staphylococcus aureus) 2008   perineal abscess  . Nocturia   . Pneumonia   . Radiation 2007   BREAST CA  . Spinal stenosis   . Stroke ([place])   . Thyroid disease   . Urge incontinence      Physical Exam: General: The patient is alert and oriented x3 in no acute distress.  Dermatology: Skin is warm, dry and supple bilateral lower extremities.  Superficial wound noted to the right anterior leg secondary to the mechanical fall.  Wound is limited to the  breakdown of skin.  Very superficial in nature with no clinical evidence of infection.  There is no erythema or edema around the area.  Hyperkeratotic elongated nails noted 1-5 bilateral  Vascular: Palpable pedal pulses bilaterally. No edema or erythema noted. Capillary refill within normal limits.  Neurological: Epicritic and protective threshold grossly intact bilaterally.   Musculoskeletal Exam: Range of motion within normal limits to all pedal and ankle joints bilateral. Muscle strength 5/5 in all groups bilateral.  There is some pain on palpation noted to the left midfoot consistent with a left midfoot sprain/capsulitis  Radiographic Exam:  Diffuse osteoporosis noted throughout all structures of the foot and ankle.  Orthopedic hardware intact of the left ankle.. Joint spaces mostly preserved.  There is no fracture identified  Assessment: 1.  Pain due to onychomycosis of toenail 1-5 bilateral 2.  Capsulitis left foot 3.  Right leg wound secondary to fall injury   Plan of Care:  1. Patient evaluated. X-Rays reviewed.  2.  Explained to the patient and the caregiver, Jan, that I am unable to identify any obvious fracture 3.  Offered anti-inflammatory steroidal injection to alleviate pain.  The patient agrees.  0.5 cc Celestone Soluspan injected into the left dorsal midfoot, midtarsal joint 4.  Prescription for tramadol 50 mg every 6 hours #30 for left foot pain 5.  Mechanical debridement of nails 1-5 bilateral was performed using a nail nipper without incident or bleeding 6.  Prescription for gentamicin cream to apply daily to the right leg superficial wound  7.  Return to clinic as needed      Edrick Kins, DPM [facility]  Dr. Edrick Kins, DPM    [address], [place] [address]                Office [phone]  Fax [phone]

## 2020-01-21 ENCOUNTER — Other Ambulatory Visit: Payer: Self-pay | Admitting: Internal Medicine

## 2020-01-21 DIAGNOSIS — Z1231 Encounter for screening mammogram for malignant neoplasm of breast: Secondary | ICD-10-CM

## 2020-01-29 ENCOUNTER — Other Ambulatory Visit: Payer: Self-pay

## 2020-01-29 ENCOUNTER — Encounter (INDEPENDENT_AMBULATORY_CARE_PROVIDER_SITE_OTHER): Payer: Self-pay | Admitting: Vascular Surgery

## 2020-01-29 ENCOUNTER — Ambulatory Visit (INDEPENDENT_AMBULATORY_CARE_PROVIDER_SITE_OTHER): Payer: Medicare Other | Admitting: Vascular Surgery

## 2020-01-29 VITALS — BP 155/75 | HR 81 | Resp 16 | Ht <= 58 in | Wt 123.6 lb

## 2020-01-29 DIAGNOSIS — I671 Cerebral aneurysm, nonruptured: Secondary | ICD-10-CM

## 2020-01-29 DIAGNOSIS — R55 Syncope and collapse: Secondary | ICD-10-CM | POA: Insufficient documentation

## 2020-01-29 DIAGNOSIS — I10 Essential (primary) hypertension: Secondary | ICD-10-CM

## 2020-01-29 DIAGNOSIS — I679 Cerebrovascular disease, unspecified: Secondary | ICD-10-CM

## 2020-01-29 NOTE — Assessment & Plan Note (Signed)
blood pressure control important in reducing the progression of atherosclerotic disease. On appropriate oral medications.  

## 2020-01-29 NOTE — Progress Notes (Signed)
Patient ID: Jodi Colon, female   DOB: 08/20/1929, 86 y.o.   MRN: [id number]  Chief Complaint  Patient presents with  . New Patient (Initial Visit)    ref Melrose Nakayama 2nd opinion rt posterior narrow    HPI Jodi Colon is a 86 y.o. female.  I am asked to see the patient by Dr. Melrose Nakayama for evaluation of intracranial disease.  The patient and her daughter report several episodes a few weeks ago where she was essentially falling asleep while doing normal activities.  This has resulted in removing some of her medications and she has not had any of these episodes in about 2 weeks.  The patient has had a previous intracranial intervention in [place] over a decade ago.  She also has a small aneurysm that has been followed in [place] as well with no role for intervention at this point.  She underwent a recent MR angiogram which I have independently reviewed.  This demonstrates a patent right M1 stent.  There is a 2 mm aneurysm in the right supraclinoid ICA which would appear stable from her previous records.  There is also a short segment narrowing versus occlusion of the right V4 segment.  No acute stroke was seen on MRI from earlier last month.   Past Medical History:  Diagnosis Date  . Adnexal cyst   . Arthritis   . Bladder cancer ([place]) 04/2015  . Brain aneurysm    small, followed with regular angiograms, Dr. Estanislado Pandy  . Breast cancer [facility]) 2007   DCIS-Right, s/p lumpectomy/xrt  . Cerebrovascular disease 2008   s/p PTCA/stent of MCA, Deveshwar  . Chronic kidney disease   . Complication of anesthesia    2003- trouble waking up after fracture surgery, no issues with anesthesia since  . DDD (degenerative disc disease), cervical   . Degenerative joint disease    managed by the Pain Clinic  . GERD (gastroesophageal reflux disease)   . Headache   . History of hiatal hernia   . History of MRSA infection   . History of TIA (transient ischemic attack)   . Hx MRSA infection   .  Hyperlipidemia   . Hypertension   . Hypothyroidism   . Incomplete bladder emptying   . 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 MRSA (methicillin resistant Staphylococcus aureus) 2008   perineal abscess  . Nocturia   . Pneumonia   . Radiation 2007   BREAST CA  . Spinal stenosis   . Stroke ([place])   . Thyroid disease   . Urge incontinence     Past Surgical History:  Procedure Laterality Date  . ABDOMINAL HYSTERECTOMY    . AQUEOUS SHUNT Left 01/05/2016   Procedure: AQUEOUS SHUNT;  Surgeon: Ronnell Freshwater, MD;  Location: [place];  Service: Ophthalmology;  Laterality: Left;  AHMED VALVE WITH SCLERAL PATCH GRAFT  . AQUEOUS SHUNT Right 07/19/2016   Procedure: AQUEOUS SHUNT;  Surgeon: Ronnell Freshwater, MD;  Location: [place];  Service: Ophthalmology;  Laterality: Right;  Ahmed tube shunt  . BACK SURGERY    . BLADDER SURGERY  2012, 2014  . BRAIN SURGERY Right 2008   stent, [facility], [place]  . BREAST LUMPECTOMY Right 2007   DCIS, Lumpectomy F/U radiation   . CARPAL TUNNEL RELEASE Left 07/2017  . CATARACT EXTRACTION    . CHOLECYSTECTOMY    . COLONOSCOPY  2013  . CYSTOSCOPY W/ RETROGRADES Bilateral 05/20/2015   Procedure:  CYSTOSCOPY WITH RETROGRADE PYELOGRAM;  Surgeon: Hollice Espy, MD;  Location: [facility];  Service: Urology;  Laterality: Bilateral;  . CYSTOSCOPY W/ RETROGRADES Bilateral 12/24/2015   Procedure: CYSTOSCOPY WITH RETROGRADE PYELOGRAM;  Surgeon: Hollice Espy, MD;  Location: [facility];  Service: Urology;  Laterality: Bilateral;  . CYSTOSCOPY WITH BIOPSY N/A 05/20/2015   Procedure: CYSTOSCOPY WITH BIOPSY;  Surgeon: Hollice Espy, MD;  Location: [facility];  Service: Urology;  Laterality: N/A;  . CYSTOSCOPY WITH BIOPSY N/A 12/24/2015   Procedure: CYSTOSCOPY WITH BIOPSY;  Surgeon: Hollice Espy, MD;  Location: [facility];  Service: Urology;  Laterality: N/A;  . EYE SURGERY Bilateral    Cataract Extraction,  glaucoma surgery 2018, 2019  . FRACTURE SURGERY Left    Ankle, compound fracture  . HEMORRHOID SURGERY  02/01/13  . LAPAROSCOPIC HYSTERECTOMY    . TONSILLECTOMY    . TOTAL HIP ARTHROPLASTY Left 11/08/2017   Procedure: TOTAL HIP ARTHROPLASTY ANTERIOR APPROACH;  Surgeon: Hessie Knows, MD;  Location: [facility];  Service: Orthopedics;  Laterality: Left;     Family History  Problem Relation Age of Onset  . Fibromyalgia Daughter   . Breast cancer Neg Hx   No bleeding or clotting disorders No aneurysms   Social History   Tobacco Use  . Smoking status: Never Smoker  . Smokeless tobacco: Never Used  Vaping Use  . Vaping Use: Never used  Substance Use Topics  . Alcohol use: No  . Drug use: No     Allergies  Allergen Reactions  . Penicillins Rash    Has patient had a PCN reaction causing immediate rash, facial/tongue/throat swelling, SOB or lightheadedness with hypotension: Yes Has patient had a PCN reaction causing severe rash involving mucus membranes or skin necrosis: No Has patient had a PCN reaction that required hospitalization: No Has patient had a PCN reaction occurring within the last 10 years: No If all of the above answers are "NO", then may proceed with Cephalosporin use.  Occurred as an adult & full body rash  Rash Rash Occurred as an adult & full body rash Has patient had a PCN reaction causing immediate rash, facial/tongue/throat swelling, SOB or lightheadedness with hypotension: Yes Has patient had a PCN reaction causing severe rash involving mucus membranes or skin necrosis: No Has patient had a PCN reaction that required hospitalization: No Has patient had a PCN reaction occurring within the last 10 years: No If all of the above answers are "NO", then may proceed with Cephalosporin use. Has patient had a PCN reaction causing immediate rash, facial/tongue/throat swelling, SOB or lightheadedness with hypotension: Yes Has patient had a PCN reaction causing severe  rash involving mucus membranes or skin necrosis: No Has patient had a PCN reaction that required hospitalization: No Has patient had a PCN reaction occurring within the last 10 years: No If all of the above answers are "NO", then may proceed with Cephalosporin use.  Occurred as an adult & full body rash Occurred as an adult & full body rash  . Sulfa Antibiotics Swelling and Rash    Facial swelling  . Sulfasalazine Swelling and Rash    "swelling of face"    Current Outpatient Medications  Medication Sig Dispense Refill  . amLODipine (NORVASC) 2.5 MG tablet Take 2.5 mg by mouth daily.    [place] Kitchen atorvastatin (LIPITOR) 10 MG tablet Take 10 mg by mouth every morning.     . cetirizine (ZYRTEC) 10 MG tablet Take 10 mg by mouth daily as needed for allergies.    [place] Kitchen  cyanocobalamin 1000 MCG tablet Take 1,000 mcg by mouth daily.    [place] Kitchen erythromycin ophthalmic ointment Place 1 application into the right eye at bedtime.    . fluticasone (FLONASE) 50 MCG/ACT nasal spray Place 1 spray into both nostrils daily as needed for allergies or rhinitis.    . Hypromellose (VISTA GEL DRY EYE RELIEF OP) Apply 1-2 drops to eye daily as needed (dry eyes).    . iron polysaccharides (NIFEREX) 150 MG capsule Take 150 mg by mouth 2 (two) times daily.     [place] Kitchen levothyroxine (SYNTHROID, LEVOTHROID) 50 MCG tablet Take 50-100 mcg by mouth See admin instructions. Take 1 tablet (33mcg) by mouth daily in the morning before breakfast except Sunday. Take 2 tablets (158mcg) by mouth daily in the morning before breakfast on Sunday.    . loperamide (IMODIUM) 2 MG capsule Take 2 mg by mouth 4 (four) times daily as needed for diarrhea or loose stools.    . Multiple Vitamin (MULTIVITAMIN) tablet Take 1 tablet by mouth daily.      . pantoprazole (PROTONIX) 40 MG tablet Take 40 mg by mouth 2 (two) times daily.     . prednisoLONE acetate (PRED FORTE) 1 % ophthalmic suspension Place 1 drop into the right eye See admin instructions. Place 1 drop into  the right eye 8 times daily.    . Probiotic Product (PROBIOTIC PO) Take 1 capsule by mouth daily.    . timolol (TIMOPTIC) 0.5 % ophthalmic solution Place 1 drop into both eyes 2 (two) times daily.     [place] Kitchen venlafaxine XR (EFFEXOR-XR) 75 MG 24 hr capsule Take 37.5 mg by mouth daily with breakfast.     . aspirin 81 MG chewable tablet Chew 1 tablet (81 mg total) by mouth 2 (two) times daily. (Patient not taking: Reported on 12/06/2019) 30 tablet 0  . ciprofloxacin (CIPRO) 250 MG tablet Take 250 mg by mouth 2 (two) times daily. (Patient not taking: Reported on 01/29/2020)    . gabapentin (NEURONTIN) 100 MG capsule Take 100 mg by mouth 3 (three) times daily. (Patient not taking: Reported on 01/29/2020)    . gentamicin cream (GARAMYCIN) 0.1 % Apply 1 application topically 2 (two) times daily. (Patient not taking: Reported on 01/29/2020) 30 g 1  . hydrocortisone 2.5 % cream Apply 1 application topically daily as needed (rash). (Patient not taking: Reported on 01/29/2020)    . senna-docusate (SENOKOT-S) 8.6-50 MG tablet Take 1 tablet by mouth at bedtime as needed for mild constipation. (Patient not taking: Reported on 12/06/2019) 15 tablet 0  . tiZANidine (ZANAFLEX) 2 MG tablet Take 2 mg by mouth at bedtime.  (Patient not taking: Reported on 01/29/2020)     No current facility-administered medications for this visit.   Facility-Administered Medications Ordered in Other Visits  Medication Dose Route Frequency Provider Last Rate Last Admin  . gemcitabine (GEMZAR) 2,000 mg  2,000 mg Intravenous Once Lloyd Huger, MD      . opium-belladonna (B&O SUPPRETTES) 16.2-30 MG suppository 30 mg  30 mg Rectal Once Hollice Espy, MD      . opium-belladonna (B&O SUPPRETTES) 16.2-30 MG suppository 30 mg  30 mg Rectal Once Lloyd Huger, MD      . opium-belladonna (B&O SUPPRETTES) 16.2-30 MG suppository 30 mg  30 mg Rectal Once Lloyd Huger, MD      . Dema Severin Petrolatum OINT 1 application  1 application Apply  externally Once Lloyd Huger, MD  REVIEW OF SYSTEMS (Negative unless checked)  Constitutional: [] Weight loss  [] Fever  [] Chills Cardiac: [] Chest pain   [] Chest pressure   [] Palpitations   [] Shortness of breath when laying flat   [] Shortness of breath at rest   [] Shortness of breath with exertion. Vascular:  [] Pain in legs with walking   [] Pain in legs at rest   [] Pain in legs when laying flat   [] Claudication   [] Pain in feet when walking  [] Pain in feet at rest  [] Pain in feet when laying flat   [] History of DVT   [] Phlebitis   [] Swelling in legs   [] Varicose veins   [] Non-healing ulcers Pulmonary:   [] Uses home oxygen   [] Productive cough   [] Hemoptysis   [] Wheeze  [] COPD   [] Asthma Neurologic:  [] Dizziness  [x] Blackouts   [] Seizures   [x] History of stroke   [x] History of TIA  [] Aphasia   [] Temporary blindness   [] Dysphagia   [] Weakness or numbness in arms   [] Weakness or numbness in legs Musculoskeletal:  [x] Arthritis   [] Joint swelling   [] Joint pain   [] Low back pain Hematologic:  [] Easy bruising  [] Easy bleeding   [] Hypercoagulable state   [] Anemic  [] Hepatitis Gastrointestinal:  [] Blood in stool   [] Vomiting blood  [] Gastroesophageal reflux/heartburn   [] Abdominal pain Genitourinary:  [x] Chronic kidney disease   [] Difficult urination  [] Frequent urination  [] Burning with urination   [] Hematuria Skin:  [] Rashes   [] Ulcers   [] Wounds Psychological:  [] History of anxiety   []  History of major depression.    Physical Exam BP (!) 155/75 (BP Location: Right Arm)   Pulse 81   Resp 16   Ht 4\' 9"  (1.448 m)   Wt 123 lb 9.6 oz (56.1 kg)   BMI 26.75 kg/m  Gen:  WD/WN, NAD.  Appears younger than stated age Head: [place]/AT, No temporalis wasting.  Ear/Nose/Throat: Hearing grossly intact, nares w/o erythema or drainage, oropharynx w/o Erythema/Exudate Eyes: Conjunctiva clear, sclera non-icteric  Neck: trachea midline.  No JVD.  Pulmonary:  Good air movement, respirations not  labored, no use of accessory muscles  Cardiac: RRR, no JVD Vascular:  Vessel Right Left  Radial Palpable Palpable                                   Gastrointestinal:. No masses, surgical incisions, or scars. Musculoskeletal: M/S 5/5 throughout.  Extremities without ischemic changes.  No deformity or atrophy.  No significant edema. Neurologic: Sensation grossly intact in extremities.  Symmetrical.  Speech is fluent. Motor exam as listed above. Psychiatric: Judgment intact, Mood & affect appropriate for pt's clinical situation. Dermatologic: No rashes or ulcers noted.  No cellulitis or open wounds.    Radiology DG Foot Complete Left  Result Date: 01/11/2020 Please see detailed radiograph report in office note.  SLEEP STUDY DOCUMENTS  Result Date: 01/04/2020 Ordered by an unspecified provider.   Labs Recent Results (from the past 2160 hour(s))  Comprehensive metabolic panel     Status: Abnormal   Collection Time: 12/06/19  8:18 AM  Result Value Ref Range   Sodium 134 (L) 135 - 145 mmol/L   Potassium 4.0 3.5 - 5.1 mmol/L   Chloride 98 98 - 111 mmol/L   CO2 25 22 - 32 mmol/L   Glucose, Bld 110 (H) 70 - 99 mg/dL    Comment: Glucose reference range applies only to samples taken after fasting for at least 8 hours.  BUN 12 8 - 23 mg/dL   Creatinine, Ser 0.82 0.44 - 1.00 mg/dL   Calcium 8.9 8.9 - 10.3 mg/dL   Total Protein 6.3 (L) 6.5 - 8.1 g/dL   Albumin 3.4 (L) 3.5 - 5.0 g/dL   AST 57 (H) 15 - 41 U/L   ALT 31 0 - 44 U/L   Alkaline Phosphatase 104 38 - 126 U/L   Total Bilirubin 0.8 0.3 - 1.2 mg/dL   GFR calc non Af Amer >60 >60 mL/min   GFR calc Af Amer >60 >60 mL/min   Anion gap 11 5 - 15    Comment: Performed at [facility], [place] [address], [place], [place] [address]  CBC with Differential     Status: Abnormal   Collection Time: 12/06/19  8:18 AM  Result Value Ref Range   WBC 5.3 4.0 - 10.5 K/uL   RBC 3.81 (L) 3.87 - 5.11 MIL/uL   Hemoglobin  9.2 (L) 12.0 - 15.0 g/dL   HCT 30.6 (L) 36 - 46 %   MCV 80.3 80.0 - 100.0 fL   MCH 24.1 (L) 26.0 - 34.0 pg   MCHC 30.1 30.0 - 36.0 g/dL   RDW 19.2 (H) 11.5 - 15.5 %   Platelets 222 150 - 400 K/uL   nRBC 0.0 0.0 - 0.2 %   Neutrophils Relative % 53 %   Neutro Abs 2.9 1.7 - 7.7 K/uL   Lymphocytes Relative 28 %   Lymphs Abs 1.5 0.7 - 4.0 K/uL   Monocytes Relative 10 %   Monocytes Absolute 0.5 0 - 1 K/uL   Eosinophils Relative 8 %   Eosinophils Absolute 0.4 0 - 0 K/uL   Basophils Relative 1 %   Basophils Absolute 0.0 0 - 0 K/uL   Immature Granulocytes 0 %   Abs Immature Granulocytes 0.02 0.00 - 0.07 K/uL    Comment: Performed at [facility], [place] [address], [place], [place] [address]  Magnesium     Status: None   Collection Time: 12/06/19  8:18 AM  Result Value Ref Range   Magnesium 2.1 1.7 - 2.4 mg/dL    Comment: Performed at [facility], [place] [address], [place], [place] [address]  Urinalysis, Routine w reflex microscopic     Status: None   Collection Time: 12/06/19  9:33 AM  Result Value Ref Range   Color, Urine YELLOW YELLOW   APPearance CLEAR CLEAR   Specific Gravity, Urine 1.005 1.005 - 1.030   pH 8.0 5.0 - 8.0   Glucose, UA NEGATIVE NEGATIVE mg/dL   Hgb urine dipstick NEGATIVE NEGATIVE   Bilirubin Urine NEGATIVE NEGATIVE   Ketones, ur NEGATIVE NEGATIVE mg/dL   Protein, ur NEGATIVE NEGATIVE mg/dL   Nitrite NEGATIVE NEGATIVE   Leukocytes,Ua NEGATIVE NEGATIVE    Comment: Performed at [place] [address] [place], [place] [address]  Urine culture     Status: None   Collection Time: 12/06/19  9:33 AM   Specimen: Urine, Clean Catch  Result Value Ref Range   Specimen Description      URINE, CLEAN CATCH Performed at [facility], [place] [address] [place], [place] [address]    Special Requests      NONE Performed at [facility], [place] [address] [place], [place]  [address]    Culture      NO GROWTH Performed at [facility], [place] [address] [place],  [address]  Report Status 12/07/2019 FINAL   TSH     Status: None   Collection Time: 12/06/19  6:00 PM  Result Value Ref Range   TSH 2.453 0.350 - 4.500 uIU/mL    Comment: Performed by a 3rd Generation assay with a functional sensitivity of <=0.01 uIU/mL. Performed at [facility], [place] [address] [place], [place] [address]   SARS Coronavirus 2 by RT PCR (hospital order, performed in [facility] hospital lab) Nasopharyngeal Nasopharyngeal Swab     Status: None   Collection Time: 12/06/19  7:51 PM   Specimen: Nasopharyngeal Swab  Result Value Ref Range   SARS Coronavirus 2 NEGATIVE NEGATIVE    Comment: (NOTE) SARS-CoV-2 target nucleic acids are NOT DETECTED.  The SARS-CoV-2 RNA is generally detectable in upper and lower respiratory specimens during the acute phase of infection. The lowest concentration of SARS-CoV-2 viral copies this assay can detect is 250 copies / mL. A negative result does not preclude SARS-CoV-2 infection and should not be used as the sole basis for treatment or other patient management decisions.  A negative result may occur with improper specimen collection / handling, submission of specimen other than nasopharyngeal swab, presence of viral mutation(s) within the areas targeted by this assay, and inadequate number of viral copies (<250 copies / mL). A negative result must be combined with clinical observations, patient history, and epidemiological information.  Fact Sheet for Patients:   StrictlyIdeas.no  Fact Sheet for Healthcare Providers: BankingDealers.co.za  This test is not yet approved or  cleared by the [place] FDA and has been authorized for detection and/or diagnosis of SARS-CoV-2 by FDA under an Emergency Use Authorization (EUA).  This EUA will remain in effect (meaning this  test can be used) for the duration of the COVID-19 declaration under Section 564(b)(1) of the Act, 21 U.S.C. section 360bbb-3(b)(1), unless the authorization is terminated or revoked sooner.  Performed at [facility], [place] [address] [place], [place] [address]     Assessment/Plan:  Aneurysm, cerebral Small.  Followed by a group in [place].  No intervention currently required.  BP (high blood pressure) blood pressure control important in reducing the progression of atherosclerotic disease. On appropriate oral medications.   Syncope The patient was having syncopal type episodes or at least blacking out episodes a few weeks ago.  She has not had any in the past couple of weeks.  I doubt this is from her intracranial carotid disease although anything is possible.  I do not think that the low likelihood of benefit of an intracranial intervention is worth the significant risk of an intracranial intervention  Intracranial vascular stenosis She underwent a recent MR angiogram which I have independently reviewed.  This demonstrates a patent right M1 stent.  There is a 2 mm aneurysm in the right supraclinoid ICA which would appear stable from her previous records.  There is also a short segment narrowing versus occlusion of the right V4 segment.  No acute stroke was seen on MRI from earlier last month. I long discussion with she and her daughter today.  I doubt that her intracranial disease is causing her recent symptoms.  I do not think intervention for her intracranial disease is worth the significant risk of the intervention for the low likelihood of benefit.  For the time being, I would just continue medical regimen without changes.  No further vascular work-up is planned at this point.  If she were to have focal stroke type symptoms I  think consideration for intervention may be more appropriate although it would still be relatively high risk.      Leotis Pain 01/29/2020,  2:38 PM   This note was created with Dragon medical transcription system.  Any errors from dictation are unintentional.

## 2020-01-29 NOTE — Assessment & Plan Note (Signed)
She underwent a recent MR angiogram which I have independently reviewed.  This demonstrates a patent right M1 stent.  There is a 2 mm aneurysm in the right supraclinoid ICA which would appear stable from her previous records.  There is also a short segment narrowing versus occlusion of the right V4 segment.  No acute stroke was seen on MRI from earlier last month. I long discussion with she and her daughter today.  I doubt that her intracranial disease is causing her recent symptoms.  I do not think intervention for her intracranial disease is worth the significant risk of the intervention for the low likelihood of benefit.  For the time being, I would just continue medical regimen without changes.  No further vascular work-up is planned at this point.  If she were to have focal stroke type symptoms I think consideration for intervention may be more appropriate although it would still be relatively high risk.

## 2020-01-29 NOTE — Assessment & Plan Note (Signed)
The patient was having syncopal type episodes or at least blacking out episodes a few weeks ago.  She has not had any in the past couple of weeks.  I doubt this is from her intracranial carotid disease although anything is possible.  I do not think that the low likelihood of benefit of an intracranial intervention is worth the significant risk of an intracranial intervention

## 2020-01-29 NOTE — Assessment & Plan Note (Signed)
Small.  Followed by a group in [place].  No intervention currently required.

## 2020-04-01 ENCOUNTER — Ambulatory Visit: Payer: Medicare Other | Admitting: Podiatry

## 2020-04-04 ENCOUNTER — Other Ambulatory Visit: Payer: Self-pay

## 2020-04-04 ENCOUNTER — Encounter: Payer: Self-pay | Admitting: Podiatry

## 2020-04-04 ENCOUNTER — Ambulatory Visit (INDEPENDENT_AMBULATORY_CARE_PROVIDER_SITE_OTHER): Payer: Medicare Other | Admitting: Podiatry

## 2020-04-04 DIAGNOSIS — M79675 Pain in left toe(s): Secondary | ICD-10-CM | POA: Diagnosis not present

## 2020-04-04 DIAGNOSIS — M79674 Pain in right toe(s): Secondary | ICD-10-CM

## 2020-04-04 DIAGNOSIS — L989 Disorder of the skin and subcutaneous tissue, unspecified: Secondary | ICD-10-CM

## 2020-04-04 DIAGNOSIS — B351 Tinea unguium: Secondary | ICD-10-CM

## 2020-04-04 NOTE — Progress Notes (Signed)
   SUBJECTIVE Patient presents to office today complaining of elongated, thickened nails that cause pain while ambulating in shoes.  She is unable to trim her own nails. Patient is here for further evaluation and treatment.  Past Medical History:  Diagnosis Date  . Adnexal cyst   . Arthritis   . Bladder cancer ([place]) 04/2015  . Brain aneurysm    small, followed with regular angiograms, Dr. Estanislado Pandy  . Breast cancer [facility]) 2007   DCIS-Right, s/p lumpectomy/xrt  . Cerebrovascular disease 2008   s/p PTCA/stent of MCA, Deveshwar  . Chronic kidney disease   . Complication of anesthesia    2003- trouble waking up after fracture surgery, no issues with anesthesia since  . DDD (degenerative disc disease), cervical   . Degenerative joint disease    managed by the Pain Clinic  . GERD (gastroesophageal reflux disease)   . Headache   . History of hiatal hernia   . History of MRSA infection   . History of TIA (transient ischemic attack)   . Hx MRSA infection   . Hyperlipidemia   . Hypertension   . Hypothyroidism   . Incomplete bladder emptying   . 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 MRSA (methicillin resistant Staphylococcus aureus) 2008   perineal abscess  . Nocturia   . Pneumonia   . Radiation 2007   BREAST CA  . Spinal stenosis   . Stroke ([place])   . Thyroid disease   . Urge incontinence     OBJECTIVE General Patient is awake, alert, and oriented x 3 and in no acute distress. Derm Skin is dry and supple bilateral. Negative open lesions or macerations. Remaining integument unremarkable. Nails are tender, long, thickened and dystrophic with subungual debris, consistent with onychomycosis, 1-5 bilateral. No signs of infection noted.  Hyperkeratotic preulcerative callus tissue also noted to the tips of the toes bilateral Vasc  DP and PT pedal pulses palpable bilaterally. Temperature gradient within normal limits.  Neuro Epicritic and protective threshold  sensation grossly intact bilaterally.  Musculoskeletal Exam No symptomatic pedal deformities noted bilateral. Muscular strength within normal limits.  ASSESSMENT 1.  Pain due to onychomycosis of toenails both 2.  Preulcerative calluses bilateral 3. Pain in foot bilateral  PLAN OF CARE 1. Patient evaluated today.  2. Instructed to maintain good pedal hygiene and foot care.  3. Mechanical debridement of nails 1-5 bilaterally performed using a nail nipper. Filed with dremel without incident.  4.  Excisional debridement of the hyperkeratotic callus tissue was performed using a tissue nipper without incident or bleeding  5.  Return to clinic in 3 mos.    Edrick Kins, DPM [facility]  Dr. Edrick Kins, [place]                                        [place], [place] [address]                Office [phone]  Fax [phone]

## 2020-04-07 ENCOUNTER — Ambulatory Visit
Admission: RE | Admit: 2020-04-07 | Discharge: 2020-04-07 | Disposition: A | Discharge status: Home / Self Care | Payer: Medicare Other | Source: Ambulatory Visit | Attending: Internal Medicine | Admitting: Internal Medicine

## 2020-04-07 ENCOUNTER — Other Ambulatory Visit: Payer: Self-pay

## 2020-04-07 DIAGNOSIS — Z1231 Encounter for screening mammogram for malignant neoplasm of breast: Secondary | ICD-10-CM | POA: Insufficient documentation

## 2020-07-10 ENCOUNTER — Ambulatory Visit: Payer: Medicare Other | Admitting: Podiatry

## 2020-07-22 ENCOUNTER — Other Ambulatory Visit: Payer: Self-pay

## 2020-07-22 ENCOUNTER — Encounter: Payer: Self-pay | Admitting: Podiatry

## 2020-07-22 ENCOUNTER — Ambulatory Visit (INDEPENDENT_AMBULATORY_CARE_PROVIDER_SITE_OTHER): Payer: Medicare Other | Admitting: Podiatry

## 2020-07-22 DIAGNOSIS — B351 Tinea unguium: Secondary | ICD-10-CM

## 2020-07-22 DIAGNOSIS — M79675 Pain in left toe(s): Secondary | ICD-10-CM

## 2020-07-22 DIAGNOSIS — L989 Disorder of the skin and subcutaneous tissue, unspecified: Secondary | ICD-10-CM

## 2020-07-22 DIAGNOSIS — M79674 Pain in right toe(s): Secondary | ICD-10-CM

## 2020-07-22 NOTE — Progress Notes (Signed)
   SUBJECTIVE Patient presents to office today complaining of elongated, thickened nails that cause pain while ambulating in shoes.  She is unable to trim her own nails. Patient is here for further evaluation and treatment.  Past Medical History:  Diagnosis Date  . Adnexal cyst   . Arthritis   . Bladder cancer ([place]) 04/2015  . Brain aneurysm    small, followed with regular angiograms, Dr. Estanislado Pandy  . Breast cancer [facility]) 2007   DCIS-Right, s/p lumpectomy/xrt  . Cerebrovascular disease 2008   s/p PTCA/stent of MCA, Deveshwar  . Chronic kidney disease   . Complication of anesthesia    2003- trouble waking up after fracture surgery, no issues with anesthesia since  . DDD (degenerative disc disease), cervical   . Degenerative joint disease    managed by the Pain [facility]  . GERD (gastroesophageal reflux disease)   . Headache   . History of hiatal hernia   . History of MRSA infection   . History of TIA (transient ischemic attack)   . Hx MRSA infection   . Hyperlipidemia   . Hypertension   . Hypothyroidism   . Incomplete bladder emptying   . 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 ([place])   . MRSA (methicillin resistant Staphylococcus aureus) 2008   perineal abscess  . Nocturia   . Pneumonia   . Radiation 2007   BREAST CA  . Spinal stenosis   . Stroke ([place])   . Thyroid disease   . Urge incontinence     OBJECTIVE General Patient is awake, alert, and oriented x 3 and in no acute distress. Derm Skin is dry and supple bilateral. Negative open lesions or macerations. Remaining integument unremarkable. Nails are tender, long, thickened and dystrophic with subungual debris, consistent with onychomycosis, 1-5 bilateral. No signs of infection noted.  Hyperkeratotic preulcerative callus tissue also noted to the tips of the toes bilateral Vasc  DP and PT pedal pulses palpable bilaterally. Temperature gradient within normal limits.  Neuro Epicritic and protective threshold  sensation grossly intact bilaterally.  Musculoskeletal Exam No symptomatic pedal deformities noted bilateral. Muscular strength within normal limits.  ASSESSMENT 1.  Pain due to onychomycosis of toenails both 2.  Preulcerative calluses bilateral 3. Pain in foot bilateral  PLAN OF CARE 1. Patient evaluated today.  2. Instructed to maintain good pedal hygiene and foot care.  3. Mechanical debridement of nails 1-5 bilaterally performed using a nail nipper. Filed with dremel without incident.  4.  Excisional debridement of the hyperkeratotic callus tissue was performed using a tissue nipper without incident or bleeding  5.  Return to clinic in 3 mos.    Edrick Kins, DPM [facility]  Dr. Edrick Kins, DPM    [address], [place] [address]                Office [phone]  Fax [phone]

## 2020-10-21 ENCOUNTER — Other Ambulatory Visit: Payer: Self-pay

## 2020-10-21 ENCOUNTER — Encounter: Payer: Self-pay | Admitting: Podiatry

## 2020-10-21 ENCOUNTER — Ambulatory Visit (INDEPENDENT_AMBULATORY_CARE_PROVIDER_SITE_OTHER): Payer: Medicare Other | Admitting: Podiatry

## 2020-10-21 DIAGNOSIS — L989 Disorder of the skin and subcutaneous tissue, unspecified: Secondary | ICD-10-CM

## 2020-10-21 DIAGNOSIS — M79675 Pain in left toe(s): Secondary | ICD-10-CM

## 2020-10-21 DIAGNOSIS — B351 Tinea unguium: Secondary | ICD-10-CM | POA: Diagnosis not present

## 2020-10-21 DIAGNOSIS — M79674 Pain in right toe(s): Secondary | ICD-10-CM | POA: Diagnosis not present

## 2020-10-21 NOTE — Progress Notes (Signed)
   SUBJECTIVE Patient presents to office today complaining of elongated, thickened nails that cause pain while ambulating in shoes.  She is unable to trim her own nails. Patient is here for further evaluation and treatment.  Past Medical History:  Diagnosis Date  . Adnexal cyst   . Arthritis   . Bladder cancer ([place]) 04/2015  . Brain aneurysm    small, followed with regular angiograms, Dr. Estanislado Pandy  . Breast cancer [facility]) 2007   DCIS-Right, s/p lumpectomy/xrt  . Cerebrovascular disease 2008   s/p PTCA/stent of MCA, Deveshwar  . Chronic kidney disease   . Complication of anesthesia    2003- trouble waking up after fracture surgery, no issues with anesthesia since  . DDD (degenerative disc disease), cervical   . Degenerative joint disease    managed by the Pain [facility]  . GERD (gastroesophageal reflux disease)   . Headache   . History of hiatal hernia   . History of MRSA infection   . History of TIA (transient ischemic attack)   . Hx MRSA infection   . Hyperlipidemia   . Hypertension   . Hypothyroidism   . Incomplete bladder emptying   . 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 ([place])   . MRSA (methicillin resistant Staphylococcus aureus) 2008   perineal abscess  . Nocturia   . Pneumonia   . Radiation 2007   BREAST CA  . Spinal stenosis   . Stroke ([place])   . Thyroid disease   . Urge incontinence     OBJECTIVE General Patient is awake, alert, and oriented x 3 and in no acute distress. Derm Skin is dry and supple bilateral. Negative open lesions or macerations. Remaining integument unremarkable. Nails are tender, long, thickened and dystrophic with subungual debris, consistent with onychomycosis, 1-5 bilateral. No signs of infection noted.  Hyperkeratotic preulcerative callus tissue also noted to the tips of the toes bilateral Vasc  DP and PT pedal pulses palpable bilaterally. Temperature gradient within normal limits.  Neuro Epicritic and protective threshold  sensation grossly intact bilaterally.  Musculoskeletal Exam No symptomatic pedal deformities noted bilateral. Muscular strength within normal limits.  ASSESSMENT 1.  Pain due to onychomycosis of toenails both 2.  Preulcerative calluses bilateral 3. Pain in foot bilateral  PLAN OF CARE 1. Patient evaluated today.  2. Instructed to maintain good pedal hygiene and foot care.  3. Mechanical debridement of nails 1-5 bilaterally performed using a nail nipper. Filed with dremel without incident.  4.  Excisional debridement of the hyperkeratotic callus tissue was performed using a tissue nipper without incident or bleeding  5.  Return to clinic in 3 mos.    Edrick Kins, DPM [facility]  Dr. Edrick Kins, DPM    [address], [place] [address]                Office [phone]  Fax [phone]

## 2021-01-27 ENCOUNTER — Other Ambulatory Visit: Payer: Self-pay

## 2021-01-27 ENCOUNTER — Encounter: Payer: Self-pay | Admitting: Podiatry

## 2021-01-27 ENCOUNTER — Ambulatory Visit (INDEPENDENT_AMBULATORY_CARE_PROVIDER_SITE_OTHER): Payer: Medicare Other | Admitting: Podiatry

## 2021-01-27 DIAGNOSIS — B351 Tinea unguium: Secondary | ICD-10-CM | POA: Diagnosis not present

## 2021-01-27 DIAGNOSIS — M79675 Pain in left toe(s): Secondary | ICD-10-CM

## 2021-01-27 DIAGNOSIS — M79674 Pain in right toe(s): Secondary | ICD-10-CM | POA: Diagnosis not present

## 2021-01-27 DIAGNOSIS — L989 Disorder of the skin and subcutaneous tissue, unspecified: Secondary | ICD-10-CM

## 2021-01-27 NOTE — Progress Notes (Signed)
   SUBJECTIVE Patient presents to office today complaining of elongated, thickened nails that cause pain while ambulating in shoes.  She is unable to trim her own nails. Patient is here for further evaluation and treatment.  Past Medical History:  Diagnosis Date   Adnexal cyst    Arthritis    Bladder cancer ([place]) 04/2015   Brain aneurysm    small, followed with regular angiograms, Dr. Estanislado Pandy   Breast cancer [facility]) 2007   DCIS-Right, s/p lumpectomy/xrt   Cerebrovascular disease 2008   s/p PTCA/stent of MCA, Deveshwar   Chronic kidney disease    Complication of anesthesia    2003- trouble waking up after fracture surgery, no issues with anesthesia since   DDD (degenerative disc disease), cervical    Degenerative joint disease    managed by the Pain Clinic   GERD (gastroesophageal reflux disease)    Headache    History of hiatal hernia    History of MRSA infection    History of TIA (transient ischemic attack)    Hx MRSA infection    Hyperlipidemia    Hypertension    Hypothyroidism    Incomplete bladder emptying    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)    MRSA (methicillin resistant Staphylococcus aureus) 2008   perineal abscess   Nocturia    Pneumonia    Radiation 2007   BREAST CA   Spinal stenosis    Stroke [facility])    Thyroid disease    Urge incontinence     OBJECTIVE General Patient is awake, alert, and oriented x 3 and in no acute distress. Derm Skin is dry and supple bilateral. Negative open lesions or macerations. Remaining integument unremarkable. Nails are tender, long, thickened and dystrophic with subungual debris, consistent with onychomycosis, 1-5 bilateral. No signs of infection noted.  Hyperkeratotic preulcerative callus tissue also noted to the tips of the toes bilateral Vasc  DP and PT pedal pulses palpable bilaterally. Temperature gradient within normal limits.  Neuro Epicritic and protective threshold sensation grossly intact  bilaterally.  Musculoskeletal Exam No symptomatic pedal deformities noted bilateral. Muscular strength within normal limits.  ASSESSMENT 1.  Pain due to onychomycosis of toenails both 2.  Preulcerative calluses bilateral 3. Pain in foot bilateral  PLAN OF CARE 1. Patient evaluated today.  2. Instructed to maintain good pedal hygiene and foot care.  3. Mechanical debridement of nails 1-5 bilaterally performed using a nail nipper. Filed with dremel without incident.  4.  Excisional debridement of the hyperkeratotic callus tissue was performed using a tissue nipper without incident or bleeding  5.  Return to clinic in 3 mos.    Edrick Kins, DPM [facility]  Dr. Edrick Kins, DPM    [address], [place] [address]                Office [phone]  Fax [phone]

## 2021-03-25 ENCOUNTER — Other Ambulatory Visit: Payer: Self-pay | Admitting: Internal Medicine

## 2021-05-01 ENCOUNTER — Ambulatory Visit: Payer: Medicare Other | Admitting: Podiatry

## 2021-05-15 ENCOUNTER — Ambulatory Visit: Payer: Medicare Other | Admitting: Podiatry

## 2021-06-09 ENCOUNTER — Ambulatory Visit (INDEPENDENT_AMBULATORY_CARE_PROVIDER_SITE_OTHER): Payer: Medicare Other | Admitting: Podiatry

## 2021-06-09 ENCOUNTER — Other Ambulatory Visit: Payer: Self-pay

## 2021-06-09 ENCOUNTER — Encounter: Payer: Self-pay | Admitting: Podiatry

## 2021-06-09 DIAGNOSIS — M79675 Pain in left toe(s): Secondary | ICD-10-CM | POA: Diagnosis not present

## 2021-06-09 DIAGNOSIS — L989 Disorder of the skin and subcutaneous tissue, unspecified: Secondary | ICD-10-CM

## 2021-06-09 DIAGNOSIS — M79674 Pain in right toe(s): Secondary | ICD-10-CM

## 2021-06-09 DIAGNOSIS — B351 Tinea unguium: Secondary | ICD-10-CM | POA: Diagnosis not present

## 2021-06-21 NOTE — Progress Notes (Signed)
° °  SUBJECTIVE Patient presents to office today complaining of elongated, thickened nails that cause pain while ambulating in shoes.  She is unable to trim her own nails. Patient is here for further evaluation and treatment.  Past Medical History:  Diagnosis Date   Adnexal cyst    Arthritis    Bladder cancer ([place]) 04/2015   Brain aneurysm    small, followed with regular angiograms, Dr. Estanislado Pandy   Breast cancer [facility]) 2007   DCIS-Right, s/p lumpectomy/xrt   Cerebrovascular disease 2008   s/p PTCA/stent of MCA, Deveshwar   Chronic kidney disease    Complication of anesthesia    2003- trouble waking up after fracture surgery, no issues with anesthesia since   DDD (degenerative disc disease), cervical    Degenerative joint disease    managed by the [facility]   GERD (gastroesophageal reflux disease)    Headache    History of hiatal hernia    History of MRSA infection    History of TIA (transient ischemic attack)    Hx MRSA infection    Hyperlipidemia    Hypertension    Hypothyroidism    Incomplete bladder emptying    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 Derm Skin is dry and supple bilateral. Negative open lesions or macerations. Remaining integument unremarkable. Nails are tender, long, thickened and dystrophic with subungual debris, consistent with onychomycosis, 1-5 bilateral. No signs of infection noted.  Hyperkeratotic preulcerative callus tissue also noted to the tips of the toes bilateral Vasc  DP and PT pedal pulses palpable bilaterally. Temperature gradient within normal limits.  Neuro Epicritic and protective threshold sensation grossly intact  bilaterally.  Musculoskeletal Exam No symptomatic pedal deformities noted bilateral. Muscular strength within normal limits.  ASSESSMENT 1.  Pain due to onychomycosis of toenails both 2.  Preulcerative calluses bilateral 3. Pain in foot bilateral  PLAN OF CARE 1. Patient evaluated today.  2. Instructed to maintain good pedal hygiene and foot care.  3. Mechanical debridement of nails 1-5 bilaterally performed using a nail nipper. Filed with dremel without incident.  4.  Excisional debridement of the hyperkeratotic callus tissue was performed using a tissue nipper without incident or bleeding  5.  Return to clinic in 3 mos.    Edrick Kins, DPM [facility]  Dr. Edrick Kins, DPM    [address], [place] [address]                Office [phone]  Fax [phone]

## 2021-07-07 ENCOUNTER — Other Ambulatory Visit: Payer: Self-pay | Admitting: Family Medicine

## 2021-07-07 DIAGNOSIS — M5412 Radiculopathy, cervical region: Secondary | ICD-10-CM

## 2021-08-04 ENCOUNTER — Telehealth: Payer: Self-pay | Admitting: Nurse Practitioner

## 2021-08-04 NOTE — Telephone Encounter (Signed)
Spoke with patient's daughter Jodi Colon regarding the Palliative referral, daughter stated that she was in a meeting and couldn't talk and took my number down and will call back. ?

## 2021-08-06 ENCOUNTER — Telehealth: Payer: Self-pay | Admitting: Nurse Practitioner

## 2021-08-06 NOTE — Telephone Encounter (Signed)
Ret'd call to patient's daughter Davy Pique, and we discussed the Palliative referral/services and rec'd verbal consent from daughter to begin services.  I have scheduled a MyChart Palliative Consult for 08/11/21 @ 2 PM ?

## 2021-08-11 ENCOUNTER — Other Ambulatory Visit: Payer: Self-pay

## 2021-08-11 ENCOUNTER — Telehealth: Payer: Medicare Other | Admitting: Nurse Practitioner

## 2021-08-11 DIAGNOSIS — Z515 Encounter for palliative care: Secondary | ICD-10-CM

## 2021-08-11 DIAGNOSIS — R531 Weakness: Secondary | ICD-10-CM

## 2021-08-11 DIAGNOSIS — G8929 Other chronic pain: Secondary | ICD-10-CM

## 2021-08-11 NOTE — Progress Notes (Signed)
? ? ?Manufacturing engineer ?Community Palliative Care Consult Note ?Telephone: [phone]  ?Fax: [phone]  ? ?Date of encounter: 08/11/21 ?3:52 PM ?PATIENT NAME: Jodi Colon ?[place] [address]   ?[place] (home)  ?DOB: July 02, 1929 ?MRN: [id number] ?PRIMARY CARE PROVIDER:    ?Jodi Harrier, MD,  ?Jodi Colon ?[place] [address] ?[place] ? ?REFERRING PROVIDER:   ?Jodi Harrier, MD ?[place] ?[facility] ?[place],  [place] [address] ?[place] ? ?RESPONSIBLE PARTY:    ?Contact Information   ? ? Name Relation Home Work Mobile  ? Jodi Colon,Jodi Colon Daughter [phone]  [phone]  ? Jodi Colon,Jodi Colon Daughter [phone]    ? [name], [name]   [phone]  ? ?  ? ?Due to the COVID-19 crisis, this visit was done via telemedicine from my office and it was initiated and consent by this patient and or family. ? ?I connected with  Jodi Colon OR PROXY on 08/11/21 by a video enabled telemedicine application and verified that I am speaking with the correct person. ?  ?I discussed the limitations of evaluation and management by telemedicine. The patient expressed understanding and agreed to proceed. Palliative Care was asked to follow this patient by consultation request of  Jodi Harrier, MD to address advance care planning and complex medical decision making. This is the initial visit.                      ?ASSESSMENT AND PLAN / RECOMMENDATIONS:  ?Symptom Management/Plan: ?1. Advance Care Planning;  DNR; [place] on the refrigerator, Jodi Colon, daughter in agreement to take a picture and email so can put in vynca. ? ?2. Goals of Care: Goals include to maximize quality of life and symptom management. Our advance care planning conversation included a discussion about:    ?The value and importance of advance care planning  ?Exploration of personal, cultural or spiritual beliefs that might influence medical decisions   ?Exploration of goals of care in the event of a sudden injury or illness  ?Identification and preparation of a healthcare agent  ?Review and updating or creation of an advance directive document. ? ?3. Chronic neck pain; discussed at length pain, taking tramadol '50mg'$  daily with some relief. Encouraged Jodi Colon to take a tylenol with the tramadol. We talked about OTC such as lidocaine patches or aspercream. Jodi Colon has not received soft collar as of yet though daughter will pick it up from pharmacy. ? ?4. Generalized weakness with strength increasing with daily routine. Discussed fall risk.  ? ?08/06/2021 weight 120.9 lbs ?BMI 26 ? ?5. Palliative care encounter; Palliative care encounter; Palliative medicine team will continue to support patient, patient's family, and medical team. Visit consisted of counseling and education dealing with the complex and emotionally intense issues of symptom management and palliative care in the setting of serious and potentially life-threatening illness ? ?Follow up Palliative Care Visit: Palliative care will continue to follow for complex medical decision making, advance care planning, and clarification of goals. Return 4 weeks or prn. ? ?I spent 36 minutes providing this consultation. More than 50% of the time in this consultation was spent in counseling and care coordination. ?PPS: 50% ? ?Chief Complaint: Initial palliative consult for complex medical decision making ? ?HISTORY OF PRESENT ILLNESS:  Jodi Colon is a 88 y.o. year old female  with multiple medical problems including h/o bladder cancer, h/o CVA, h/o subarachnoid hemorrhage/brain aneurysm (followed by Neurosurgery), CVD s/p PTCA  R MCA, DDD, chronic neck pain, HTN, anemia, cataract, GERD, osteopenia, HLD, RLS, spinal stenosis, thyroid disease, h/o urinary incontinence. I connected with Jodi Colon and daughter Jodi Colon. We talked about last time Jodi Colon has been independent about 4 years ago. We reviewed past medical  history, recent d/c from hospice services as she has stabilized. We talked about ros, symptoms, sleep patterns, appetite, no weight loss. We talked more specifically about her chronic neck pain, medications, alternative modalities. We talked about functional abilities. We talked about fall risk, life alert, will ask PC SW to contact Jodi Colon with information. We talked about medical goals, DNR on refrigerator. 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 We talked about role pc in poc. Discussed will have PC RN f/u in 4 weeks then PC SW in 8 weeks. Jodi Colon in agreement. Jodi Colon answered questions, very interactive. Therapeutic listening, emotional support provided. Questions answered.  ? ?History obtained from review of EMR, discussion with  Jodi Colon.  ?I reviewed available labs, medications, imaging, studies and related documents from the EMR.  Records reviewed and summarized above.  ? ?ROS ?10 point system reviewed with Jodi Colon and daughter Jodi Colon all negative except HPI ? ?Physical Exam: ?deferred ?CURRENT PROBLEM LIST:  ?Patient Active Problem List  ? Diagnosis Date Noted  ? Syncope 01/29/2020  ? Intracranial vascular stenosis 01/29/2020  ? Falls frequently 12/17/2019  ? Seizure-like activity ([place]) 12/17/2019  ? Weakness 12/17/2019  ? Macular puckering, right eye 05/18/2019  ? Mechanical breakdown of intraocular lens 05/18/2019  ? Primary open angle glaucoma (POAG) of both eyes, moderate stage 05/18/2019  ? Pseudophakia of both eyes 05/18/2019  ? Status post total hip replacement, left 11/08/2017  ? SOBOE (shortness of breath on exertion) 10/04/2017  ? Umbilical hernia without obstruction and without gangrene 07/28/2016  ? Restless leg 10/21/2015  ? Urinary frequency 10/15/2015  ? Dysuria 10/15/2015  ? Bursitis, trochanteric 04/02/2015  ? Trochanteric bursitis of right hip 04/02/2015  ? Disease of thyroid gland 03/04/2015  ? Acid reflux 03/04/2015  ? Subarachnoid hemorrhage ([place]) 02/09/2015  ?  Degenerative arthritis of lumbar spine 01/29/2015  ? DDD (degenerative disc disease), lumbar 01/29/2015  ? Arthritis, degenerative 01/28/2014  ? Infection with methicillin-resistant Staphylococcus aureus 01/28/2014  ? BP (high blood pressure) 01/28/2014  ? HLD (hyperlipidemia) 01/28/2014  ? Carcinoma in situ, breast, ductal 01/28/2014  ? Clinical depression 01/28/2014  ? Aneurysm, cerebral 01/28/2014  ? Intraductal carcinoma of breast 01/28/2014  ? Lumbar canal stenosis 12/07/2013  ? Neuritis or radiculitis due to rupture of lumbar intervertebral disc 12/07/2013  ? Cramp in lower leg 06/19/2013  ? Hemorrhoids that prolapse with straining, but retract spontaneously 01/24/2013  ? Scalp lesion 11/27/2012  ? Rectal prolapse 11/27/2012  ? Hemorrhoid 08/29/2012  ? Screening breast examination 07/12/2012  ? FOM (frequency of micturition) 02/28/2012  ? Urge incontinence 02/28/2012  ? History of neoplasm of bladder 02/28/2012  ? Excessive urination at night 02/28/2012  ? Bladder CA in situ 02/28/2012  ? History of methicillin resistant Staphylococcus aureus infection 12/30/2011  ? H/O infectious disease 12/30/2011  ? Cutaneous ulcer ([place]) 11/26/2011  ? Skin ulcer ([place]) 11/26/2011  ? Colon polyp 11/11/2011  ? Absolute anemia 11/11/2011  ? Back pain, chronic 11/01/2011  ? Abscess, gluteal, right 10/10/2011  ? Abscess of buttock 10/10/2011  ? Degenerative joint disease   ? Cerebrovascular disease   ? MRSA (methicillin resistant Staphylococcus aureus)   ? Hyperlipidemia LDL goal < 100 04/02/2011  ?  Bladder cancer ([place]) 10/29/2009  ? Malignant neoplasm of urinary bladder ([place]) 10/29/2009  ? ?PAST MEDICAL HISTORY:  ?Active Ambulatory Problems  ?  Diagnosis Date Noted  ? Hyperlipidemia LDL goal < 100 04/02/2011  ? Degenerative joint disease   ? Cerebrovascular disease   ? MRSA (methicillin resistant Staphylococcus aureus)   ? Abscess, gluteal, right 10/10/2011  ? Scalp lesion 11/27/2012  ? Rectal prolapse 11/27/2012  ? Hemorrhoids  that prolapse with straining, but retract spontaneously 01/24/2013  ? Subarachnoid hemorrhage ([place]) 02/09/2015  ? FOM (frequency of micturition) 02/28/2012  ? Urge incontinence 02/28/2012  ? Cutaneous ulcer ([place])

## 2021-08-14 NOTE — Progress Notes (Signed)
PC SW outreached patients daughter, [name], per [facility] NP - C. Gusler SW referral request to assess patients needs. ? ?Daughter shared that she was interested in acquiring information on life alert system for patient. Daughter shared that patient had a life alert system many years ago. SW advised that daughter outreach patients insurance to see if [facility] has a program or offers coverage.  ? ?SW emailed daughter information on [facility] medical alert system as well for daughter to look into.  ? ?Questions and concerns were addressed. The patient/family was encouraged to call with any additional questions and/or concerns. PC Provided general support and encouragement, no other unmet needs identified. Will continue to follow. ? ?Next in home visit scheduled for 08/30/21 '@1'$ :30PM ?

## 2021-08-31 ENCOUNTER — Other Ambulatory Visit: Payer: Medicare Other

## 2021-08-31 VITALS — BP 140/68 | HR 73 | Temp 97.3°F | Resp 22

## 2021-08-31 DIAGNOSIS — Z515 Encounter for palliative care: Secondary | ICD-10-CM

## 2021-08-31 NOTE — Progress Notes (Signed)
PATIENT NAME: Jodi Colon ?DOB: 1930-04-30 ?MRN: [id number] ? ?PRIMARY CARE PROVIDER: Tracie Harrier, MD ? ?RESPONSIBLE PARTY:  ?Acct ID - Guarantor Home Phone Work Phone Relationship Acct Type  ?[id number] NURA, CAHOON* [phone]  Self P/F  ?   [address], [place], [place] [address]  ? ? ?PLAN OF CARE and INTERVENTIONS: ?              1.  GOALS OF CARE/ ADVANCE CARE PLANNING:  Remain under the care of family and remain home and independent. ?              2.  EDUCATION:  Palliative Care ?              4. PERSONAL EMERGENCY PLAN:  Activate 911 for emergencies. ?              5.  DISEASE STATUS: ? ?Chronic Neck Pain:  Patient has a soft cervical collar she is wearing PRN.  She will look into a pediatric size and the adult size seems to large.  Patient is taking Tramadol daily and adding a tylenol.  Pain is better managed.  She also using ice and alternating with heat when needed.  She was previously scheduled for outpatient therapy but no one contacted her and she decided not to pursue this.  Patient advised she is also not interested in surgery unless this is absolutely necessary.  She is content to manage her symptoms a home under her PCP supervision.  She has also canceled her MRI of her neck and shoulder.  ? ?Generalized Weakness:  Patient endorses ongoing weakness and is asking about exercises.  She is ambulating with a rolling walker.  No recent falls.  Patient is already doing sitting leg raises and stretches.  Patient has housekeeping 2 x a monthly.  Patient advised she is able to cook meals and perform light housekeeping duties.  She has assistive devices in the bathroom and showers independently. ? ?Palliative Care:  Explained Palliative Care services to patient.   ? ?Safety:  Family is working on getting  a life alert for added support.  They continue to assist with outside shopping as patient is no longer driving.  ? ? ?HISTORY OF PRESENT ILLNESS:   Jodi Colon is a 88 y.o. year old female   with multiple medical problems including h/o bladder cancer, h/o CVA, h/o subarachnoid hemorrhage/brain aneurysm (followed by Neurosurgery), CVD s/p PTCA R MCA, DDD, chronic neck pain, HTN, anemia, cataract, GERD, osteopenia, HLD, RLS, spinal stenosis, thyroid disease, h/o urinary incontinence.  Patient is followed by Palliative Care every 4-8 weeks and PRN.  ? ? ?CODE STATUS: DNR but needs the form.  Notified Christin Gusler, NP ?ADVANCED DIRECTIVES:  ?MOST FORM: Yes ?PPS: 50% ? ? ?PHYSICAL EXAM:  ? ?VITALS: ?Today's Vitals  ? 08/31/21 1345  ?Pulse: 73  ?Temp: (!) 97.3 ?F (36.3 ?C)  ?SpO2: 97%  ?  ?LUNGS: clear to auscultation  ?CARDIAC: Cor RRR}  ?EXTREMITIES: trace edema ?SKIN: Skin color, texture, turgor normal. No rashes or lesions or normal  ?NEURO: positive for gait problems ? ? ? ? ? ? ?Lorenza Burton, RN ? ?

## 2021-08-31 NOTE — Progress Notes (Signed)
COMMUNITY PALLIATIVE CARE SW NOTE ? ?PATIENT NAME: Jodi Colon ?DOB: Jul 18, 1929 ?MRN: [id number] ? ?PRIMARY CARE PROVIDER: Tracie Harrier, MD ? ?RESPONSIBLE PARTY:  ?Acct ID - Guarantor Home Phone Work Phone Relationship Acct Type  ?[id number] Jodi Colon* [phone]  Self P/F  ?   [place], [place], [place] [address]  ? ? ? ?PLAN OF CARE and INTERVENTIONS:          ? ?   GOALS OF CARE/ ADVANCE CARE PLANNING: Goals include to maximize quality of life and symptom management. Our advance care planning conversation included a discussion about:    ?The value and importance of advance care planning  ?Review and updating or creation of an advance directive document.  ? Patient is a DNR. MOST form completed. Patient needs DNR form in the home. Patients daughter [name] holds [employer]. ?  ?2.       Palliative care encounter: Palliative medicine team will continue to support patient, patient's family, and medical team. Visit consisted of counseling and education dealing with the complex and emotionally intense issues of symptom management and palliative care in the setting of serious and potentially life-threatening illness. ? ?SW and RN met with patient for monthly in home visit. Patient lives in a one story home independently.  ? ?Functional changes/updates: Patient has not had any functional changes since his recent PC visit. Patient was dc'd from Hospice services the beginning of March. Patient was sitting in living room during visit. Patient denies recent falls. Patient has a slight shuffle gait. Fall and safety precautions discussed. Patient continues to uses rollator to assist with ambulation.  Patient is independent with all ADL's. Patient shares that she has pain in her neck and shoulder daily from a fall that she had last year in July along with arthritis.  ? ?Home & Environment assessment: No concerns. Patient feels safe in her home environment. Patients home is one level. Patient is able to maneuver around  her home without issue.  ? ?Protein calorie malnutrition/weight loss/Appetite: Patient's appetite is fair-good. No weight loss reported, patient share that she has gained weight.  ? ?Psychosocial assessment: completed. No additional physical needs identified at this time. SW discussed life alert programs with patient, patient share that she will be discussing more with her daughter as her daughter has the information on life alert systems. Ongoing support/resources will continued to be offered if needed.  ? ?Military hx: N/A  ? ?Medical assessment: RN reviewed medications and took vitals.  ? ?Hospice discussion: N/A ? ?SW discussed goals, reviewed care plan, provided emotional support, used active and reflective listening in the form of reciprocity emotional response. Questions and concerns were addressed. The patient/family was encouraged to call with any additional questions and/or concerns. PC Provided general support and encouragement, no other unmet needs identified. Will continue to follow. ? ?3.         PATIENT/CAREGIVER EDUCATION/ COPING:   ?Appearance: well groomed, appropriate given situation  ?Mental Status: Alert/oriented. ?Eye Contact: Good. Able to engage in proper eye contact  ?Thought Process: rational  ?Thought Content: not assessed  ?Speech: Normal rate, volume, tone  ?Mood: Normal and calm ?Affect: Congruent to endorsed mood, full ranging ?Insight: normal ?Judgement: normal  ?Interaction Style: Cooperative ? ?Patient A&O, patient engaged in fluent conversation and answered all questions appropriately. Patients mood was jovial during visit today. PHQ 9 is 0. Patient enjoys coloring and reading, however, she has now been set up with receiving audio books due to being unable  to see book prints good. Family is supportive and live locally.  ? ?4.         PERSONAL EMERGENCY PLAN:  Patient will call 9-1-1 for emergencies.   ? ?5.         COMMUNITY RESOURCES COORDINATION/ HEALTH CARE NAVIGATION:  Patient  and children manages his care. ? ?6.          FINANCIAL CONCERNS/NEEDS: None. ?                        Primary Health Insurance: [facility] Medicare ?Secondary Health Insurance: N/A ?Prescription Coverage: Yes, no history of difficulty obtaining or affording prescriptions reported ?   ? ?SOCIAL HX:  ?Social History  ? ?Tobacco Use  ? Smoking status: Never  ? Smokeless tobacco: Never  ?Substance Use Topics  ? Alcohol use: No  ? ? ?CODE STATUS: DNR ?ADVANCED DIRECTIVES: N ?MOST FORM COMPLETE: Y ?HOSPICE EDUCATION PROVIDED: N ? ?PPS: Patient is INDP with all ADL's at this time. Patient is A&O  with good insight and judgement.  ? ? ?Time spent: 1hr ? ? ? ? ? ?Doreene Eland, LCSW ? ?

## 2021-10-16 ENCOUNTER — Ambulatory Visit: Payer: Medicare Other | Admitting: Podiatry

## 2021-10-28 ENCOUNTER — Other Ambulatory Visit: Payer: Medicare Other | Admitting: Nurse Practitioner

## 2021-10-28 ENCOUNTER — Encounter: Payer: Self-pay | Admitting: Nurse Practitioner

## 2021-10-28 DIAGNOSIS — Z515 Encounter for palliative care: Secondary | ICD-10-CM

## 2021-10-28 DIAGNOSIS — G8929 Other chronic pain: Secondary | ICD-10-CM

## 2021-10-28 DIAGNOSIS — R531 Weakness: Secondary | ICD-10-CM

## 2021-10-28 NOTE — Progress Notes (Signed)
[facility] Palliative Care Consult Note Telephone: [phone]  Fax: [phone]    Date of encounter: 10/28/21 5:56 PM PATIENT NAME: Jodi Colon [id number]   [phone] (home)  DOB: August 09, 1929 MRN: [id number] PRIMARY CARE PROVIDER:    Tracie Harrier, MD,  [address] [facility] [place] [address] [phone]  RESPONSIBLE PARTY:    Contact Information     Name Relation Home Work [place] Daughter [phone]  [phone]   [facility] Daughter [phone]     Jasemine, Nawaz   [phone]      I met face to face with patient in home. Palliative Care was asked to follow this patient by consultation request of  Tracie Harrier, MD to address advance care planning and complex medical decision making. This is a follow up visit. 1. Advance Care Planning;  DNR; Goldenrod on the refrigerator, Alleen Borne, daughter in agreement to take a picture and email so can put in vynca.   2. Goals of Care: Goals include to maximize quality of life and symptom management. Our advance care planning conversation included a discussion about:    The value and importance of advance care planning  Exploration of personal, cultural or spiritual beliefs that might influence medical decisions  Exploration of goals of care in the event of a sudden injury or illness  Identification and preparation of a healthcare agent  Review and updating or creation of an advance directive document.   3. Chronic neck pain; discussed at length pain, continue to take tylenol with the tramadol which has helped. We talked about OTC [facility] topically.    4. Generalized weakness with strength increasing with daily routine. Discussed fall risk. We talked about physical therapy, will ask [facility] to evaluate to order.    5. Palliative care encounter; Palliative care encounter; Palliative medicine team will continue  to support patient, patient's family, and medical team. Visit consisted of counseling and education dealing with the complex and emotionally intense issues of symptom management and palliative care in the setting of serious and potentially life-threatening illness   Follow up Palliative Care Visit: Palliative care will continue to follow for complex medical decision making, advance care planning, and clarification of goals. Return 4 weeks or prn.   I spent 61 minutes providing this consultation. More than 50% of the time in this consultation was spent in counseling and care coordination. PPS: 50%   Chief Complaint: Initial palliative consult for complex medical decision making   HISTORY OF PRESENT ILLNESS:  Jodi Colon is a 88 y.o. year old female  with multiple medical problems including h/o bladder cancer, h/o CVA, h/o subarachnoid hemorrhage/brain aneurysm (followed by Neurosurgery), CVD s/p PTCA R MCA, DDD, chronic neck pain, HTN, anemia, cataract, GERD, osteopenia, HLD, RLS, spinal stenosis, thyroid disease, h/o urinary incontinence. I called Jodi Colon, Jodi Colon to confirm pc in person visit. I visited Jodi Colon in her home, we sat at the kitchen table. We talked about how she has been feeling. We talked about and reviewed symptoms, ros including pain. We talked about tramadol with tylenol does help considerable. We talked about OTC [facility] topical with benefits. We talked about continuing current plan. We talked about no recent falls, wounds, hospitalizations, infections. We talked about medical goals. We talked about DNR, goldenrod completed. Placed in vynca. We talked about difficulty getting to primary providers appointments. We talked about [facility] and wishes are to proceed  with referral to [facility]. We talked about f/u appetite which is improving, sleep patterns. We talked about mobility. Jodi Colon endorses she feels like she continues to improve with strength. We talked about therapy.  Will have [facility] f/u about order for therapy.  Discussed will have PC RN f/u in 4 weeks then PC SW in 8 weeks. Jodi Colon in agreement. Jodi Colon answered questions, very interactive. Therapeutic listening, emotional support provided. Questions answered.    History obtained from review of EMR, discussion with Jodi Colon.  I reviewed available labs, medications, imaging, studies and related documents from the EMR.  Records reviewed and summarized above.    ROS 10 point system reviewed with Jodi Colon all negative except HPI Physical Exam: Constitutional: NAD General: frail appearing, pleasant female EYES: lids intact ENMT: oral mucous membranes moist CV: S1S2, RRR Pulmonary: LCTA, no increased work of breathing, no cough, room air Abdomen: soft and non tender MSK: ambulatory with walker Skin: warm and dry Neuro:  + generalized weakness,  no cognitive impairment Psych: non-anxious affect, A and O x 3  Thank you for the opportunity to participate in the care of Jodi Colon.  The palliative care team will continue to follow. Please call our office at [phone] if we can be of additional assistance.   Jodi Exantus Z Rakim Moone, NP   COVID-19 PATIENT SCREENING TOOL Asked and negative response unless otherwise noted:   Have you had symptoms of covid, tested positive or been in contact with someone with symptoms/positive test in the past 5-10 days? no

## 2021-11-05 ENCOUNTER — Telehealth: Payer: Self-pay

## 2021-11-05 NOTE — Telephone Encounter (Signed)
915 am.  Phone call made to patient to schedule a home visit at the request of Christin Gusler, NP.  No answer.  Message left requesting a call back.

## 2021-11-05 NOTE — Telephone Encounter (Signed)
Message received from patient at 1053 am.  Patient voiced her confusion over my call.  Attempted to call patient back but no answer. Phone call made to daughter Davy Pique to follow up and schedule a home visit.  No answer.  Message left requesting a call back.

## 2021-11-25 ENCOUNTER — Other Ambulatory Visit: Payer: Medicare Other

## 2021-11-25 DIAGNOSIS — Z515 Encounter for palliative care: Secondary | ICD-10-CM

## 2021-11-25 NOTE — Progress Notes (Signed)
COMMUNITY PALLIATIVE CARE SW NOTE  PATIENT NAME: Jodi Colon DOB: [birth year] MRN: [id number]  PRIMARY CARE PROVIDER: Tracie Harrier, MD  RESPONSIBLE PARTY:  Acct ID - Guarantor Home Phone Work Phone Relationship Acct Type  [id number] TAHANI, [phone]  Self P/F     [place], [place], [place] [address]     PLAN OF CARE and INTERVENTIONS:          GOALS OF CARE/ ADVANCE CARE PLANNING: Goals include to maximize quality of life and symptom management. Our advance care planning conversation included a discussion about:    Code Status: Patient is a DNR. MOST form complete. Patients daughter is POA.   2.       Palliative care encounter: Palliative medicine team will continue to support patient, patient's family, and medical team. Visit consisted of counseling and education dealing with the complex and emotionally intense issues of symptom management and palliative care in the setting of serious and potentially life-threatening illness. SW completed visit with PC RN Olivia Canter, met with patient. Patient lives in a one story home independently.  Functional changes/updates: Patient has not any functional changes or decline since previous PC visit. Patient was sitting in living room during visit. No falls reported. No pain other than chronic neck pain that her providers are aware of.  Patient shared that she felt as though she had an UTI. Patient reports bladder spasms, burning, frequency and no odor.   Protein calorie malnutrition/weight loss/Appetite: Patient's appetite is good. No weight loss concerns. Current weight is 122lbs.  Psychosocial assessment: completed.   Home support: patient lives independently. [facility] is now following patient.  Next visit scheduled for 7/20 @2pm  or follow up and lab work.    Transportation: no needs currently. Has transportation resource for future needs.  Food: patient shares that she orders her food to have delivered.   Long term  planning: [place] possible [place]. Patient would like to remain at home for as long as possible. Talked about the possibility of employing dog walkers when the time comes.  Safety: patient now has life alert and wears it daily.  Ongoing support/resources will continued to be offered if needed.   Medical assessment: Patient had a virtual telephone call with Sharyn Lull with [facility] to discuss her UTI symptoms. Cipro was ordered and sent to patient's pharmacy.  Upcoming appointments: 7/20 - [facility]   SW discussed goals, reviewed care plan, provided emotional support, used active and reflective listening in the form of reciprocity emotional response. Patient wishes to keep patient in the home for as long as possible. Questions and concerns were addressed. The patient/family was encouraged to call with any additional questions and/or concerns. PC Provided general support and encouragement, no other unmet needs identified. Will continue to follow.  3.         PATIENT/CAREGIVER EDUCATION/ COPING:   Appearance: well groomed, appropriate given situation  Mental Status: alert and oriented  Eye Contact: good Thought Process: rational  Thought Content: not assessed  Speech: Normal rate, volume, tone  Mood: Normal and calm Affect: Congruent to endorsed mood, full ranging Insight: good Judgement: good Interaction Style: Cooperative  Patient A&O, patient engaged in fluent conversation and answered all questions appropriately. Patients mood was jovial during visit today. PHQ 9 is 0. Patient enjoys coloring and reading, however, she has now been set up with receiving audio books due to being unable to see book prints good. Family is supportive  and live locally.    4.         PERSONAL EMERGENCY PLAN:  Patient will call 9-1-1 for emergencies.  Patient has life alert system.   5.         COMMUNITY RESOURCES COORDINATION/ HEALTH CARE NAVIGATION:   Patient and children manages her care.   6.          FINANCIAL CONCERNS/NEEDS: None.                         Primary Health Insurance: [facility] Medicare Secondary Health Insurance: N/A Prescription Coverage: Yes, no history of difficulty obtaining or affording prescriptions reported        SOCIAL HX:  Social History   Tobacco Use   Smoking status: Never   Smokeless tobacco: Never  Substance Use Topics   Alcohol use: No    CODE STATUS: DNR  ADVANCED DIRECTIVES: Y MOST FORM COMPLETE:  Y HOSPICE EDUCATION PROVIDED: N  PPS: Patient is INDP with all ADL's at this time. Patient is A&O  with good insight and judgement.    Time spent: 1 hr      [place], [place]

## 2021-12-29 ENCOUNTER — Ambulatory Visit (INDEPENDENT_AMBULATORY_CARE_PROVIDER_SITE_OTHER): Payer: Medicare Other | Admitting: Podiatry

## 2021-12-29 DIAGNOSIS — B351 Tinea unguium: Secondary | ICD-10-CM | POA: Diagnosis not present

## 2021-12-29 DIAGNOSIS — M79674 Pain in right toe(s): Secondary | ICD-10-CM | POA: Diagnosis not present

## 2021-12-29 DIAGNOSIS — M79675 Pain in left toe(s): Secondary | ICD-10-CM

## 2021-12-29 NOTE — Progress Notes (Signed)
   Chief Complaint  Patient presents with   rfc    Rfc     SUBJECTIVE Patient presents to office today complaining of elongated, thickened nails that cause pain while ambulating in shoes.  Patient is unable to trim their own nails. Patient is here for further evaluation and treatment.  Past Medical History:  Diagnosis Date   Adnexal cyst    Arthritis    Bladder cancer ([place]) 04/2015   Brain aneurysm    small, followed with regular angiograms, Dr. Estanislado Pandy   Breast cancer [facility]) 2007   DCIS-Right, s/p lumpectomy/xrt   Cerebrovascular disease 2008   s/p PTCA/stent of MCA, Deveshwar   Chronic kidney disease    Complication of anesthesia    2003- trouble waking up after fracture surgery, no issues with anesthesia since   DDD (degenerative disc disease), cervical    Degenerative joint disease    [facility] by [facility]   GERD (gastroesophageal reflux disease)    Headache    History of hiatal hernia    History of MRSA infection    History of TIA (transient ischemic attack)    Hx MRSA infection    Hyperlipidemia    Hypertension    Hypothyroidism    Incomplete bladder emptying    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 Derm Skin is dry and supple bilateral. Negative open lesions or macerations. Remaining integument unremarkable. Nails are tender, long, thickened and dystrophic with subungual debris, consistent with onychomycosis, 1-5 bilateral. No signs of infection noted. Vasc  DP and PT pedal pulses palpable bilaterally. Temperature gradient within normal limits.  Neuro Epicritic and protective threshold sensation grossly intact bilaterally.   Musculoskeletal Exam No symptomatic pedal deformities noted bilateral. Muscular strength within normal limits.  ASSESSMENT 1.  Pain due to onychomycosis of toenails both  PLAN OF CARE 1. Patient evaluated today.  2. Instructed to maintain good pedal hygiene and foot care.  3. Mechanical debridement of nails 1-5 bilaterally performed using a nail nipper. Filed with dremel without incident.  4. Return to clinic in 3 mos.    Edrick Kins, DPM [facility]  Dr. Edrick Kins, DPM    [address], [place] [address]                Office [phone]  Fax [phone]

## 2022-01-19 ENCOUNTER — Other Ambulatory Visit: Payer: Medicare Other | Admitting: Nurse Practitioner

## 2022-01-19 ENCOUNTER — Encounter: Payer: Self-pay | Admitting: Nurse Practitioner

## 2022-01-19 DIAGNOSIS — R531 Weakness: Secondary | ICD-10-CM

## 2022-01-19 DIAGNOSIS — Z515 Encounter for palliative care: Secondary | ICD-10-CM

## 2022-01-19 DIAGNOSIS — T7840XD Allergy, unspecified, subsequent encounter: Secondary | ICD-10-CM

## 2022-01-19 NOTE — Progress Notes (Signed)
  [facility] Palliative Care Consult Note Telephone: [phone]  Fax: [phone]    Date of encounter: 01/19/22 12:25 PM PATIENT NAME: Makaleigh W Sibert [address] [place] [address]   [phone] (home)  DOB: 04/13/1930 MRN: [id number] PRIMARY CARE PROVIDER:    [name], Michelle B, NP,  [address] [place] [address] [phone]  REFERRING PROVIDER:   Hande, Vishwanath, MD [address] [facility] [place],  [place] [address] [phone]  RESPONSIBLE PARTY:    Contact Information     Name Relation Home Work Mobile   Debrueler,Sonya Daughter [phone]  [phone]   Clayon,Amy Daughter [phone]     Bruso, Eric Son   [phone]           I met face to face with patient in home. Palliative Care was asked to follow this patient by consultation request of  Hande, Vishwanath, MD to address advance care planning and complex medical decision making. This is a follow up visit. 1. Advance Care Planning;  DNR; Goldenrod on the refrigerator   2. Goals of Care: Goals include to maximize quality of life and symptom management. Our advance care planning conversation included a discussion about:    The value and importance of advance care planning  Exploration of personal, cultural or spiritual beliefs that might influence medical decisions  Exploration of goals of care in the event of a sudden injury or illness  Identification and preparation of a healthcare agent  Review and updating or creation of an advance directive document. 3.Allergies, uncontrolled symptoms, discussed will stop zyrtec as she has been on for many years due to resistant, start claritan; continue nasal spray, monitor s/s Rx: Claritan 10mg take 1 tablet qhs; #90, 1RF (Ms Gundry requested 90 day supply) escribed  4. Generalized weakness, improved, continue to ambulate with walker, fall risk, discussed safety  5. Palliative  care encounter; Palliative care encounter; Palliative medicine team will continue to support patient, patient's family, and medical team. Visit consisted of counseling and education dealing with the complex and emotionally intense issues of symptom management and palliative care in the setting of serious and potentially life-threatening illness   Follow up Palliative Care Visit: Palliative care will continue to follow for complex medical decision making, advance care planning, and clarification of goals. Return 12 weeks or prn.   I spent 43 minutes providing this consultation. More than 50% of the time in this consultation was spent in counseling and care coordination. PPS: 50%   Chief Complaint: Initial palliative consult for complex medical decision making   HISTORY OF PRESENT ILLNESS:  Jamar W Danish is a 88 y.o. year old female  with multiple medical problems including h/o bladder cancer, h/o CVA, h/o subarachnoid hemorrhage/brain aneurysm (followed by Neurosurgery), CVD s/p PTCA R MCA, DDD, chronic neck pain, HTN, anemia, cataract, GERD, osteopenia, HLD, RLS, spinal stenosis, thyroid disease, h/o urinary incontinence. I called Sonya, Ms. Incorvaia to confirm pc in person visit. I visited Ms. Worster in her home, we sat in her living room. We talked about purpose of PC visit, poc, ros, functional ability, ambulates with walker, no recent falls. Ms Frandsen endorses recent UTI with bladder spasms which have resolved with abx with last dose tomorrow. We talked about appetite, her daughter brings her meals and instacart by daughter-in law she gets her groceries delivered. We talked about no recent weight loss. Ms. Lukes talked about sleeping fairly well except last night she had difficulty   getting to sleep which is rare. We talked about allergies at length. Discussed currently taking zyrtec and has been for many years. Discussed can switch to another antihistamine as resistance. Ms. Shappell in agreement, sent claritan  in. Ms. Egner endorses she was concerned about medications making her sleepy, will schedule claritan qhs. Rx: sent to pharmacy. We talked about [facility] has been working out well. We talked about quality of life, reviewed medical goals, no other changes recommended today, supportive visit.  Discussed will have f/u pc visit in 12 weeks, since stable, Ms. Perella in agreement. Ms. Uriegas answered questions, very interactive. Therapeutic listening, emotional support provided. Questions answered.    History obtained from review of EMR, discussion with Ms. Diefendorf and daughter Davy Pique by phone.  I reviewed available labs, medications, imaging, studies and related documents from the EMR.  Records reviewed and summarized above.    ROS 10 point system reviewed with Ms. Terrero all negative except HPI Physical Exam: Constitutional: NAD General: frail appearing, pleasant female EYES: lids intact ENMT: oral mucous membranes moist CV: S1S2, RRR Pulmonary: LCTA Abdomen: soft and non tender MSK: ambulatory with walker Skin: warm and dry Neuro:  + generalized weakness,  no cognitive impairment Psych: non-anxious affect, A and O x 3   Thank you for the opportunity to participate in the care of Ms. Ovid Curd.  The palliative care team will continue to follow. Please call our office at [phone] if we can be of additional assistance.   Zeriah Baysinger Ihor Gully, NP

## 2022-03-15 ENCOUNTER — Other Ambulatory Visit (HOSPITAL_COMMUNITY): Payer: Self-pay | Admitting: Gastroenterology

## 2022-03-15 ENCOUNTER — Other Ambulatory Visit: Payer: Self-pay | Admitting: Gastroenterology

## 2022-03-15 DIAGNOSIS — K219 Gastro-esophageal reflux disease without esophagitis: Secondary | ICD-10-CM

## 2022-03-15 DIAGNOSIS — K449 Diaphragmatic hernia without obstruction or gangrene: Secondary | ICD-10-CM

## 2022-03-15 DIAGNOSIS — R1319 Other dysphagia: Secondary | ICD-10-CM

## 2022-03-16 ENCOUNTER — Ambulatory Visit (INDEPENDENT_AMBULATORY_CARE_PROVIDER_SITE_OTHER): Payer: Medicare Other | Admitting: Podiatry

## 2022-03-16 DIAGNOSIS — M79675 Pain in left toe(s): Secondary | ICD-10-CM | POA: Diagnosis not present

## 2022-03-16 DIAGNOSIS — B351 Tinea unguium: Secondary | ICD-10-CM | POA: Diagnosis not present

## 2022-03-16 DIAGNOSIS — M79674 Pain in right toe(s): Secondary | ICD-10-CM

## 2022-03-16 NOTE — Progress Notes (Signed)
   Chief Complaint  Patient presents with   foot care    Patient states that she is here to get her toe nails trimmed.    SUBJECTIVE Patient presents to office today complaining of elongated, thickened nails that cause pain while ambulating in shoes.  Patient is unable to trim their own nails. Patient is here for further evaluation and treatment.  Past Medical History:  Diagnosis Date   Adnexal cyst    Arthritis    Bladder cancer ([place]) 04/2015   Brain aneurysm    small, followed with regular angiograms, Dr. Estanislado Pandy   Breast cancer [facility]) 2007   DCIS-Right, s/p lumpectomy/xrt   Cerebrovascular disease 2008   s/p PTCA/stent of MCA, Deveshwar   Chronic kidney disease    Complication of anesthesia    2003- trouble waking up after fracture surgery, no issues with anesthesia since   DDD (degenerative disc disease), cervical    Degenerative joint disease    managed by [facility]   GERD (gastroesophageal reflux disease)    Headache    History of hiatal hernia    History of MRSA infection    History of TIA (transient ischemic attack)    Hx MRSA infection    Hyperlipidemia    Hypertension    Hypothyroidism    Incomplete bladder emptying    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 Derm Skin is dry and supple bilateral. Negative open lesions or macerations. Remaining integument unremarkable. Nails are tender, long, thickened and dystrophic with subungual debris, consistent with onychomycosis, 1-5 bilateral. No signs of infection noted. Vasc  DP and PT pedal pulses palpable bilaterally. Temperature gradient within normal limits.  Neuro Epicritic and  protective threshold sensation grossly intact bilaterally.  Musculoskeletal Exam No symptomatic pedal deformities noted bilateral. Muscular strength within normal limits.  ASSESSMENT 1.  Pain due to onychomycosis of toenails both  PLAN OF CARE 1. Patient evaluated today.  2. Instructed to maintain good pedal hygiene and foot care.  3. Mechanical debridement of nails 1-5 bilaterally performed using a nail nipper. Filed with dremel without incident.  4. Return to clinic in 3 mos.    Edrick Kins, DPM [facility]  Dr. Edrick Kins, DPM    [address], [place] [address]                Office [phone]  Fax [phone]

## 2022-03-25 ENCOUNTER — Other Ambulatory Visit: Payer: Medicare Other

## 2022-03-29 ENCOUNTER — Encounter (INDEPENDENT_AMBULATORY_CARE_PROVIDER_SITE_OTHER): Payer: Self-pay

## 2022-03-30 ENCOUNTER — Ambulatory Visit: Payer: Medicare Other | Admitting: Podiatry

## 2022-04-01 ENCOUNTER — Other Ambulatory Visit: Payer: Medicare Other | Admitting: Nurse Practitioner

## 2022-04-01 ENCOUNTER — Encounter: Payer: Self-pay | Admitting: Nurse Practitioner

## 2022-04-01 DIAGNOSIS — Z515 Encounter for palliative care: Secondary | ICD-10-CM

## 2022-04-01 DIAGNOSIS — R531 Weakness: Secondary | ICD-10-CM

## 2022-04-01 NOTE — Progress Notes (Signed)
Ord Consult Note Telephone: [phone]  Fax: [phone]    Date of encounter: 04/01/22 5:47 PM PATIENT NAME: [place] [id number]   [phone] (home)  DOB: [birth year] MRN: [id number] PRIMARY CARE PROVIDER:    Jennette Dubin, NP,  [address] [place] [address] [phone]  RESPONSIBLE PARTY:    Contact Information     Name Relation Home Work Mobile   Debrueler,Sonya Daughter [phone]  [phone]   [facility] Daughter [phone]     Margarita, Croke   [phone]     I connected with  Sabino Snipes on 04/01/22 by a telephone as video not available enabled telemedicine application and verified that I am speaking with the correct person using two identifiers.   I discussed the limitations of evaluation and management by telemedicine. The patient expressed understanding and agreed to proceed.  Palliative Care was asked to follow this patient by consultation request of  Tracie Harrier, MD to address advance care planning and complex medical decision making. This is a follow up visit. 1. Advance Care Planning;  DNR; [place] on the refrigerator  PC NP to sign off as PC program changing; PC RN/PC SW to continue to follow. Currently stable, asymptomatic   2. Goals of Care: Goals include to maximize quality of life and symptom management. Our advance care planning conversation included a discussion about:    The value and importance of advance care planning  Exploration of personal, cultural or spiritual beliefs that might influence medical decisions  Exploration of goals of care in the event of a sudden injury or illness  Identification and preparation of a healthcare agent  Review and updating or creation of an advance directive document. 3.Generalized weakness, improved, continue to ambulate with walker, fall risk, discussed safety   4.  Palliative care encounter; Palliative care encounter; Palliative medicine team will continue to support patient, patient's family, and medical team. Visit consisted of counseling and education dealing with the complex and emotionally intense issues of symptom management and palliative care in the setting of serious and potentially life-threatening illness   Follow up Palliative Care Visit: Palliative care will continue to follow for complex medical decision making, advance care planning, and clarification of goals. Return 12 weeks or prn.   I spent 33 minutes providing this consultation. More than 50% of the time in this consultation was spent in counseling and care coordination. PPS: 50%   Chief Complaint: Initial palliative consult for complex medical decision making   HISTORY OF PRESENT ILLNESS:  ROMANDA TURRUBIATES is a 88 y.o. year old female  with multiple medical problems including h/o bladder cancer, h/o CVA, h/o subarachnoid hemorrhage/brain aneurysm (followed by Neurosurgery), CVD s/p PTCA R MCA, DDD, chronic neck pain, HTN, anemia, cataract, GERD, osteopenia, HLD, RLS, spinal stenosis, thyroid disease, h/o urinary incontinence. I called Ms. Ovid Curd for telephone telemedicine f/u pc visit. We talked about how she has been feeling. Ms. Ambrocio endorses she has been doing well. We talked about overall progress. Allergies which were a concern last pc visit have improved. We talked about ros, no recent falls, wounds, hospitalizations, no further infections. We talked about Ms. Kuras continues to live alone with family, adult children who help her get groceries, errands. We talked about her functional abilities where she is still independent. We talked about medical goals. We talked about things she likes to do. We talked about [facility] NP  to sign off as Ms. Dunbar remains stable will continue with PC RN/PC SW for f/u visits. Ms. Kishbaugh verbalized understanding. Therapeutic listening, emotional support provided.  Questions answered.    History obtained from review of EMR, discussion with Ms. Bartelt and daughter Davy Pique by phone.  I reviewed available labs, medications, imaging, studies and related documents from the EMR.  Records reviewed and summarized above.    ROS 10 point system reviewed with Ms. Zachman all negative except HPI Physical Exam: Deferred  Thank you for the opportunity to participate in the care of Ms. Ovid Curd.  The palliative care team will continue to follow. Please call our office at [phone] if we can be of additional assistance.   Thaison Kolodziejski Ihor Gully, NP

## 2022-06-18 ENCOUNTER — Ambulatory Visit: Payer: Medicare Other | Admitting: Podiatry

## 2022-06-22 ENCOUNTER — Ambulatory Visit (INDEPENDENT_AMBULATORY_CARE_PROVIDER_SITE_OTHER): Payer: Medicare Other | Admitting: Podiatry

## 2022-06-22 DIAGNOSIS — M79675 Pain in left toe(s): Secondary | ICD-10-CM | POA: Diagnosis not present

## 2022-06-22 DIAGNOSIS — M79674 Pain in right toe(s): Secondary | ICD-10-CM

## 2022-06-22 DIAGNOSIS — B351 Tinea unguium: Secondary | ICD-10-CM

## 2022-06-22 NOTE — Progress Notes (Signed)
   No chief complaint on file.   SUBJECTIVE Patient presents to office today complaining of elongated, thickened nails that cause pain while ambulating in shoes.  Patient is unable to trim their own nails. Patient is here for further evaluation and treatment.  Past Medical History:  Diagnosis Date   Adnexal cyst    Arthritis    Bladder cancer ([place]) 04/2015   Brain aneurysm    small, followed with regular angiograms, Dr. Estanislado Pandy   Breast cancer [facility]) 2007   DCIS-Right, s/p lumpectomy/xrt   Cerebrovascular disease 2008   s/p PTCA/stent of MCA, Deveshwar   Chronic kidney disease    Complication of anesthesia    2003- trouble waking up after fracture surgery, no issues with anesthesia since   DDD (degenerative disc disease), cervical    Degenerative joint disease    managed by [facility]   GERD (gastroesophageal reflux disease)    Headache    History of hiatal hernia    History of MRSA infection    History of TIA (transient ischemic attack)    Hx MRSA infection    Hyperlipidemia    Hypertension    Hypothyroidism    Incomplete bladder emptying    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 Derm Skin is dry and supple bilateral. Negative open lesions or macerations. Remaining integument unremarkable. Nails are tender, long, thickened and dystrophic with subungual debris, consistent with onychomycosis, 1-5 bilateral. No signs of infection noted. Vasc  DP and PT pedal pulses palpable bilaterally. Temperature gradient within normal limits.  Neuro Epicritic and protective threshold sensation grossly intact bilaterally.  Musculoskeletal Exam No symptomatic  pedal deformities noted bilateral. Muscular strength within normal limits.  ASSESSMENT 1.  Pain due to onychomycosis of toenails both  PLAN OF CARE 1. Patient evaluated today.  2. Instructed to maintain good pedal hygiene and foot care.  3. Mechanical debridement of nails 1-5 bilaterally performed using a nail nipper. Filed with dremel without incident.  4. Return to clinic in 3 mos.    Edrick Kins, DPM [facility]  Dr. Edrick Kins, DPM    [address], [place] [address]                Office [phone]  Fax [phone]

## 2022-07-26 ENCOUNTER — Other Ambulatory Visit: Payer: Self-pay

## 2022-07-26 DIAGNOSIS — Z79899 Other long term (current) drug therapy: Secondary | ICD-10-CM

## 2022-07-26 DIAGNOSIS — E785 Hyperlipidemia, unspecified: Secondary | ICD-10-CM | POA: Diagnosis present

## 2022-07-26 DIAGNOSIS — Z853 Personal history of malignant neoplasm of breast: Secondary | ICD-10-CM

## 2022-07-26 DIAGNOSIS — A040 Enteropathogenic Escherichia coli infection: Secondary | ICD-10-CM | POA: Diagnosis present

## 2022-07-26 DIAGNOSIS — A044 Other intestinal Escherichia coli infections: Secondary | ICD-10-CM | POA: Diagnosis present

## 2022-07-26 DIAGNOSIS — G2581 Restless legs syndrome: Secondary | ICD-10-CM | POA: Diagnosis not present

## 2022-07-26 DIAGNOSIS — Z66 Do not resuscitate: Secondary | ICD-10-CM | POA: Diagnosis present

## 2022-07-26 DIAGNOSIS — I2510 Atherosclerotic heart disease of native coronary artery without angina pectoris: Secondary | ICD-10-CM | POA: Diagnosis present

## 2022-07-26 DIAGNOSIS — I5032 Chronic diastolic (congestive) heart failure: Secondary | ICD-10-CM | POA: Diagnosis present

## 2022-07-26 DIAGNOSIS — F32A Depression, unspecified: Secondary | ICD-10-CM | POA: Diagnosis present

## 2022-07-26 DIAGNOSIS — N179 Acute kidney failure, unspecified: Principal | ICD-10-CM | POA: Diagnosis present

## 2022-07-26 DIAGNOSIS — Z8551 Personal history of malignant neoplasm of bladder: Secondary | ICD-10-CM

## 2022-07-26 DIAGNOSIS — E559 Vitamin D deficiency, unspecified: Secondary | ICD-10-CM | POA: Diagnosis present

## 2022-07-26 DIAGNOSIS — M5030 Other cervical disc degeneration, unspecified cervical region: Secondary | ICD-10-CM | POA: Diagnosis present

## 2022-07-26 DIAGNOSIS — M1990 Unspecified osteoarthritis, unspecified site: Secondary | ICD-10-CM | POA: Diagnosis present

## 2022-07-26 DIAGNOSIS — Z1611 Resistance to penicillins: Secondary | ICD-10-CM | POA: Diagnosis present

## 2022-07-26 DIAGNOSIS — Z8614 Personal history of Methicillin resistant Staphylococcus aureus infection: Secondary | ICD-10-CM

## 2022-07-26 DIAGNOSIS — K219 Gastro-esophageal reflux disease without esophagitis: Secondary | ICD-10-CM | POA: Diagnosis present

## 2022-07-26 DIAGNOSIS — I4719 Other supraventricular tachycardia: Secondary | ICD-10-CM | POA: Diagnosis not present

## 2022-07-26 DIAGNOSIS — G4700 Insomnia, unspecified: Secondary | ICD-10-CM | POA: Diagnosis not present

## 2022-07-26 DIAGNOSIS — E860 Dehydration: Secondary | ICD-10-CM | POA: Diagnosis present

## 2022-07-26 DIAGNOSIS — E639 Nutritional deficiency, unspecified: Secondary | ICD-10-CM | POA: Diagnosis present

## 2022-07-26 DIAGNOSIS — M4800 Spinal stenosis, site unspecified: Secondary | ICD-10-CM | POA: Diagnosis present

## 2022-07-26 DIAGNOSIS — Z923 Personal history of irradiation: Secondary | ICD-10-CM

## 2022-07-26 DIAGNOSIS — I472 Ventricular tachycardia, unspecified: Secondary | ICD-10-CM | POA: Diagnosis not present

## 2022-07-26 DIAGNOSIS — Z96642 Presence of left artificial hip joint: Secondary | ICD-10-CM | POA: Diagnosis present

## 2022-07-26 DIAGNOSIS — I110 Hypertensive heart disease with heart failure: Secondary | ICD-10-CM | POA: Diagnosis present

## 2022-07-26 DIAGNOSIS — D696 Thrombocytopenia, unspecified: Secondary | ICD-10-CM | POA: Diagnosis not present

## 2022-07-26 DIAGNOSIS — R7881 Bacteremia: Secondary | ICD-10-CM | POA: Diagnosis present

## 2022-07-26 DIAGNOSIS — Z79890 Hormone replacement therapy (postmenopausal): Secondary | ICD-10-CM

## 2022-07-26 DIAGNOSIS — Z8673 Personal history of transient ischemic attack (TIA), and cerebral infarction without residual deficits: Secondary | ICD-10-CM

## 2022-07-26 DIAGNOSIS — E871 Hypo-osmolality and hyponatremia: Secondary | ICD-10-CM | POA: Diagnosis present

## 2022-07-26 DIAGNOSIS — D649 Anemia, unspecified: Secondary | ICD-10-CM | POA: Diagnosis present

## 2022-07-26 DIAGNOSIS — I679 Cerebrovascular disease, unspecified: Secondary | ICD-10-CM | POA: Diagnosis present

## 2022-07-26 DIAGNOSIS — E861 Hypovolemia: Secondary | ICD-10-CM | POA: Diagnosis present

## 2022-07-26 DIAGNOSIS — Z882 Allergy status to sulfonamides status: Secondary | ICD-10-CM

## 2022-07-26 DIAGNOSIS — E876 Hypokalemia: Secondary | ICD-10-CM | POA: Diagnosis not present

## 2022-07-26 DIAGNOSIS — E039 Hypothyroidism, unspecified: Secondary | ICD-10-CM | POA: Diagnosis present

## 2022-07-26 DIAGNOSIS — Z880 Allergy status to penicillin: Secondary | ICD-10-CM

## 2022-07-26 DIAGNOSIS — N390 Urinary tract infection, site not specified: Secondary | ICD-10-CM | POA: Diagnosis not present

## 2022-07-26 DIAGNOSIS — Z1152 Encounter for screening for COVID-19: Secondary | ICD-10-CM

## 2022-07-26 DIAGNOSIS — R262 Difficulty in walking, not elsewhere classified: Secondary | ICD-10-CM | POA: Diagnosis not present

## 2022-07-26 DIAGNOSIS — Z7982 Long term (current) use of aspirin: Secondary | ICD-10-CM

## 2022-07-26 LAB — COMPREHENSIVE METABOLIC PANEL
ALT: 23 U/L (ref 0–44)
AST: 40 U/L (ref 15–41)
Albumin: 3.3 g/dL — ABNORMAL LOW (ref 3.5–5.0)
Alkaline Phosphatase: 105 U/L (ref 38–126)
Anion gap: 12 (ref 5–15)
BUN: 35 mg/dL — ABNORMAL HIGH (ref 8–23)
CO2: 24 mmol/L (ref 22–32)
Calcium: 8.3 mg/dL — ABNORMAL LOW (ref 8.9–10.3)
Chloride: 90 mmol/L — ABNORMAL LOW (ref 98–111)
Creatinine, Ser: 1.91 mg/dL — ABNORMAL HIGH (ref 0.44–1.00)
GFR, Estimated: 25 mL/min — ABNORMAL LOW (ref >60)
Glucose, Bld: 126 mg/dL — ABNORMAL HIGH (ref 70–99)
Potassium: 3.8 mmol/L (ref 3.5–5.1)
Sodium: 126 mmol/L — ABNORMAL LOW (ref 135–145)
Total Bilirubin: 1 mg/dL (ref 0.3–1.2)
Total Protein: 6.6 g/dL (ref 6.5–8.1)

## 2022-07-26 LAB — LIPASE, BLOOD: Lipase: 29 U/L (ref 11–51)

## 2022-07-26 LAB — CBC
HCT: 36.9 % (ref 36.0–46.0)
Hemoglobin: 12.5 g/dL (ref 12.0–15.0)
MCH: 30 pg (ref 26.0–34.0)
MCHC: 33.9 g/dL (ref 30.0–36.0)
MCV: 88.7 fL (ref 80.0–100.0)
Platelets: 137 10*3/uL — ABNORMAL LOW (ref 150–400)
RBC: 4.16 MIL/uL (ref 3.87–5.11)
RDW: 13.2 % (ref 11.5–15.5)
WBC: 16.3 10*3/uL — ABNORMAL HIGH (ref 4.0–10.5)
nRBC: 0 % (ref 0.0–0.2)

## 2022-07-26 LAB — RESP PANEL BY RT-PCR (RSV, FLU A&B, COVID)  RVPGX2
Influenza A by PCR: NEGATIVE
Influenza B by PCR: NEGATIVE
Resp Syncytial Virus by PCR: NEGATIVE
SARS Coronavirus 2 by RT PCR: NEGATIVE

## 2022-07-26 MED ORDER — SODIUM CHLORIDE 0.9 % IV BOLUS
1000.0000 mL | Freq: Once | INTRAVENOUS | Status: AC
  Administered 2022-07-26: 1000 mL via INTRAVENOUS

## 2022-07-26 NOTE — ED Triage Notes (Addendum)
Pt arrives via [facility] from home with CC of diarrhea that has been ongoing since Saturday (two days ago) with 11 episodes in the last 24 hours. Denies nausea and vomiting.

## 2022-07-27 ENCOUNTER — Emergency Department: Payer: Medicare Other

## 2022-07-27 ENCOUNTER — Inpatient Hospital Stay
Admission: EM | Admit: 2022-07-27 | Discharge: 2022-07-31 | DRG: 683 | Disposition: A | Discharge status: Skilled Nursing Facility | Payer: Medicare Other | Attending: Student | Admitting: Student

## 2022-07-27 DIAGNOSIS — D649 Anemia, unspecified: Secondary | ICD-10-CM | POA: Diagnosis present

## 2022-07-27 DIAGNOSIS — A040 Enteropathogenic Escherichia coli infection: Secondary | ICD-10-CM | POA: Diagnosis present

## 2022-07-27 DIAGNOSIS — B9620 Unspecified Escherichia coli [E. coli] as the cause of diseases classified elsewhere: Secondary | ICD-10-CM

## 2022-07-27 DIAGNOSIS — I5021 Acute systolic (congestive) heart failure: Secondary | ICD-10-CM | POA: Diagnosis not present

## 2022-07-27 DIAGNOSIS — G4700 Insomnia, unspecified: Secondary | ICD-10-CM | POA: Diagnosis not present

## 2022-07-27 DIAGNOSIS — E039 Hypothyroidism, unspecified: Secondary | ICD-10-CM | POA: Diagnosis not present

## 2022-07-27 DIAGNOSIS — R7881 Bacteremia: Secondary | ICD-10-CM

## 2022-07-27 DIAGNOSIS — N390 Urinary tract infection, site not specified: Secondary | ICD-10-CM

## 2022-07-27 DIAGNOSIS — A044 Other intestinal Escherichia coli infections: Secondary | ICD-10-CM | POA: Diagnosis present

## 2022-07-27 DIAGNOSIS — D696 Thrombocytopenia, unspecified: Secondary | ICD-10-CM | POA: Diagnosis not present

## 2022-07-27 DIAGNOSIS — Z1152 Encounter for screening for COVID-19: Secondary | ICD-10-CM | POA: Diagnosis not present

## 2022-07-27 DIAGNOSIS — R531 Weakness: Secondary | ICD-10-CM

## 2022-07-27 DIAGNOSIS — E871 Hypo-osmolality and hyponatremia: Secondary | ICD-10-CM | POA: Diagnosis present

## 2022-07-27 DIAGNOSIS — F32A Depression, unspecified: Secondary | ICD-10-CM | POA: Diagnosis present

## 2022-07-27 DIAGNOSIS — Z1611 Resistance to penicillins: Secondary | ICD-10-CM | POA: Diagnosis present

## 2022-07-27 DIAGNOSIS — E876 Hypokalemia: Secondary | ICD-10-CM | POA: Diagnosis not present

## 2022-07-27 DIAGNOSIS — E785 Hyperlipidemia, unspecified: Secondary | ICD-10-CM | POA: Diagnosis present

## 2022-07-27 DIAGNOSIS — R197 Diarrhea, unspecified: Secondary | ICD-10-CM | POA: Diagnosis not present

## 2022-07-27 DIAGNOSIS — N179 Acute kidney failure, unspecified: Secondary | ICD-10-CM | POA: Diagnosis not present

## 2022-07-27 DIAGNOSIS — Z66 Do not resuscitate: Secondary | ICD-10-CM | POA: Diagnosis present

## 2022-07-27 DIAGNOSIS — I10 Essential (primary) hypertension: Secondary | ICD-10-CM | POA: Diagnosis present

## 2022-07-27 DIAGNOSIS — I472 Ventricular tachycardia, unspecified: Secondary | ICD-10-CM | POA: Diagnosis not present

## 2022-07-27 DIAGNOSIS — I110 Hypertensive heart disease with heart failure: Secondary | ICD-10-CM | POA: Diagnosis present

## 2022-07-27 DIAGNOSIS — I5032 Chronic diastolic (congestive) heart failure: Secondary | ICD-10-CM | POA: Diagnosis present

## 2022-07-27 DIAGNOSIS — I2510 Atherosclerotic heart disease of native coronary artery without angina pectoris: Secondary | ICD-10-CM | POA: Diagnosis present

## 2022-07-27 DIAGNOSIS — E860 Dehydration: Secondary | ICD-10-CM | POA: Diagnosis present

## 2022-07-27 DIAGNOSIS — K219 Gastro-esophageal reflux disease without esophagitis: Secondary | ICD-10-CM | POA: Diagnosis present

## 2022-07-27 DIAGNOSIS — I4719 Other supraventricular tachycardia: Secondary | ICD-10-CM | POA: Diagnosis not present

## 2022-07-27 LAB — GASTROINTESTINAL PANEL BY PCR, STOOL (REPLACES STOOL CULTURE)

## 2022-07-27 LAB — BLOOD CULTURE ID PANEL (REFLEXED) - BCID2

## 2022-07-27 LAB — CBC
HCT: 32.4 % — ABNORMAL LOW (ref 36.0–46.0)
Hemoglobin: 10.9 g/dL — ABNORMAL LOW (ref 12.0–15.0)
MCH: 29.5 pg (ref 26.0–34.0)
MCHC: 33.6 g/dL (ref 30.0–36.0)
MCV: 87.8 fL (ref 80.0–100.0)
Platelets: 104 10*3/uL — ABNORMAL LOW (ref 150–400)
RBC: 3.69 MIL/uL — ABNORMAL LOW (ref 3.87–5.11)
RDW: 13.2 % (ref 11.5–15.5)
WBC: 10.2 10*3/uL (ref 4.0–10.5)
nRBC: 0 % (ref 0.0–0.2)

## 2022-07-27 LAB — URINALYSIS, ROUTINE W REFLEX MICROSCOPIC
Bilirubin Urine: NEGATIVE
Glucose, UA: NEGATIVE mg/dL
Ketones, ur: NEGATIVE mg/dL
Nitrite: NEGATIVE
Protein, ur: 100 mg/dL — AB
Specific Gravity, Urine: 1.01 (ref 1.005–1.030)
WBC, UA: >50 WBC/hpf (ref 0–5)
pH: 6 (ref 5.0–8.0)

## 2022-07-27 LAB — NA AND K (SODIUM & POTASSIUM), RAND UR
Potassium Urine: 15 mmol/L
Sodium, Ur: 18 mmol/L

## 2022-07-27 LAB — BASIC METABOLIC PANEL
Anion gap: 9 (ref 5–15)
BUN: 33 mg/dL — ABNORMAL HIGH (ref 8–23)
CO2: 21 mmol/L — ABNORMAL LOW (ref 22–32)
Calcium: 8 mg/dL — ABNORMAL LOW (ref 8.9–10.3)
Chloride: 98 mmol/L (ref 98–111)
Creatinine, Ser: 1.55 mg/dL — ABNORMAL HIGH (ref 0.44–1.00)
GFR, Estimated: 32 mL/min — ABNORMAL LOW (ref >60)
Glucose, Bld: 113 mg/dL — ABNORMAL HIGH (ref 70–99)
Potassium: 3.5 mmol/L (ref 3.5–5.1)
Sodium: 128 mmol/L — ABNORMAL LOW (ref 135–145)

## 2022-07-27 LAB — OSMOLALITY, URINE: Osmolality, Ur: 236 mOsm/kg — ABNORMAL LOW (ref 300–900)

## 2022-07-27 LAB — TSH: TSH: 1.748 u[IU]/mL (ref 0.350–4.500)

## 2022-07-27 LAB — MAGNESIUM: Magnesium: 1.5 mg/dL — ABNORMAL LOW (ref 1.7–2.4)

## 2022-07-27 LAB — LACTIC ACID, PLASMA: Lactic Acid, Venous: 1.3 mmol/L (ref 0.5–1.9)

## 2022-07-27 LAB — PHOSPHORUS: Phosphorus: 2.2 mg/dL — ABNORMAL LOW (ref 2.5–4.6)

## 2022-07-27 LAB — OSMOLALITY: Osmolality: 281 mOsm/kg (ref 275–295)

## 2022-07-27 LAB — C DIFFICILE QUICK SCREEN W PCR REFLEX
C Diff antigen: NEGATIVE
C Diff interpretation: NOT DETECTED
C Diff toxin: NEGATIVE

## 2022-07-27 LAB — VITAMIN D 25 HYDROXY (VIT D DEFICIENCY, FRACTURES): Vit D, 25-Hydroxy: 19.48 ng/mL — ABNORMAL LOW (ref 30–100)

## 2022-07-27 MED ORDER — SODIUM CHLORIDE 0.9 % IV SOLN
2.0000 g | Freq: Two times a day (BID) | INTRAVENOUS | Status: DC
  Administered 2022-07-27 – 2022-07-28 (×2): 2 g via INTRAVENOUS
  Filled 2022-07-27 (×2): qty 2
  Filled 2022-07-27: qty 10

## 2022-07-27 MED ORDER — TIMOLOL MALEATE 0.5 % OP SOLN
1.0000 [drp] | Freq: Two times a day (BID) | OPHTHALMIC | Status: DC
  Administered 2022-07-27: 1 [drp] via OPHTHALMIC
  Filled 2022-07-27: qty 5

## 2022-07-27 MED ORDER — AMLODIPINE BESYLATE 5 MG PO TABS
2.5000 mg | ORAL_TABLET | Freq: Every day | ORAL | Status: DC
  Administered 2022-07-27: 2.5 mg via ORAL
  Filled 2022-07-27: qty 1

## 2022-07-27 MED ORDER — ONDANSETRON HCL 4 MG PO TABS: 4.0000 mg | ORAL_TABLET | Freq: Four times a day (QID) | ORAL | Status: DC | PRN

## 2022-07-27 MED ORDER — POLYSACCHARIDE IRON COMPLEX 150 MG PO CAPS
150.0000 mg | ORAL_CAPSULE | Freq: Two times a day (BID) | ORAL | Status: DC
  Administered 2022-07-27 – 2022-07-31 (×9): 150 mg via ORAL
  Filled 2022-07-27 (×10): qty 1

## 2022-07-27 MED ORDER — ONDANSETRON HCL 4 MG/2ML IJ SOLN: 4.0000 mg | Freq: Four times a day (QID) | INTRAMUSCULAR | Status: DC | PRN

## 2022-07-27 MED ORDER — LEVOTHYROXINE SODIUM 100 MCG PO TABS: 100.0000 ug | ORAL_TABLET | ORAL | Status: DC

## 2022-07-27 MED ORDER — ENOXAPARIN SODIUM 40 MG/0.4ML IJ SOSY: 40.0000 mg | PREFILLED_SYRINGE | INTRAMUSCULAR | Status: DC

## 2022-07-27 MED ORDER — K PHOS MONO-SOD PHOS DI & MONO 155-852-130 MG PO TABS
500.0000 mg | ORAL_TABLET | Freq: Four times a day (QID) | ORAL | Status: AC
  Administered 2022-07-27 (×2): 500 mg via ORAL
  Filled 2022-07-27 (×2): qty 2

## 2022-07-27 MED ORDER — SENNOSIDES-DOCUSATE SODIUM 8.6-50 MG PO TABS: 1.0000 | ORAL_TABLET | Freq: Every evening | ORAL | Status: DC | PRN

## 2022-07-27 MED ORDER — SODIUM CHLORIDE 0.9 % IV SOLN: INTRAVENOUS | Status: DC

## 2022-07-27 MED ORDER — HEPARIN SODIUM (PORCINE) 5000 UNIT/ML IJ SOLN
5000.0000 [IU] | Freq: Three times a day (TID) | INTRAMUSCULAR | Status: DC
  Administered 2022-07-27 – 2022-07-31 (×13): 5000 [IU] via SUBCUTANEOUS
  Filled 2022-07-27 (×14): qty 1

## 2022-07-27 MED ORDER — RISAQUAD PO CAPS
1.0000 | ORAL_CAPSULE | Freq: Every day | ORAL | Status: DC
  Administered 2022-07-27 – 2022-07-31 (×5): 1 via ORAL
  Filled 2022-07-27 (×5): qty 1

## 2022-07-27 MED ORDER — ARTIFICIAL TEARS OPHTHALMIC OINT: TOPICAL_OINTMENT | Freq: Every day | OPHTHALMIC | Status: DC | PRN

## 2022-07-27 MED ORDER — ACETAMINOPHEN 325 MG PO TABS
650.0000 mg | ORAL_TABLET | Freq: Four times a day (QID) | ORAL | Status: DC | PRN
  Administered 2022-07-27 – 2022-07-28 (×3): 650 mg via ORAL
  Filled 2022-07-27 (×3): qty 2

## 2022-07-27 MED ORDER — ADULT MULTIVITAMIN W/MINERALS CH
1.0000 | ORAL_TABLET | Freq: Every day | ORAL | Status: DC
  Administered 2022-07-27 – 2022-07-31 (×5): 1 via ORAL
  Filled 2022-07-27 (×5): qty 1

## 2022-07-27 MED ORDER — ATORVASTATIN CALCIUM 20 MG PO TABS
10.0000 mg | ORAL_TABLET | ORAL | Status: DC
  Administered 2022-07-27 – 2022-07-31 (×5): 10 mg via ORAL
  Filled 2022-07-27 (×5): qty 1

## 2022-07-27 MED ORDER — VENLAFAXINE HCL ER 37.5 MG PO CP24
37.5000 mg | ORAL_CAPSULE | Freq: Every day | ORAL | Status: DC
  Administered 2022-07-27: 37.5 mg via ORAL
  Filled 2022-07-27: qty 1

## 2022-07-27 MED ORDER — VITAMIN B-12 1000 MCG PO TABS
1000.0000 ug | ORAL_TABLET | Freq: Every day | ORAL | Status: DC
  Administered 2022-07-27 – 2022-07-31 (×5): 1000 ug via ORAL
  Filled 2022-07-27 (×5): qty 1

## 2022-07-27 MED ORDER — LEVOTHYROXINE SODIUM 50 MCG PO TABS
50.0000 ug | ORAL_TABLET | ORAL | Status: DC
  Administered 2022-07-27 – 2022-07-30 (×3): 50 ug via ORAL
  Filled 2022-07-27 (×4): qty 1

## 2022-07-27 MED ORDER — LOPERAMIDE HCL 2 MG PO CAPS: 2.0000 mg | ORAL_CAPSULE | Freq: Four times a day (QID) | ORAL | Status: DC | PRN

## 2022-07-27 MED ORDER — LACTATED RINGERS IV BOLUS
1000.0000 mL | Freq: Once | INTRAVENOUS | Status: AC
  Administered 2022-07-27: 1000 mL via INTRAVENOUS

## 2022-07-27 MED ORDER — ACETAMINOPHEN 650 MG RE SUPP: 650.0000 mg | Freq: Four times a day (QID) | RECTAL | Status: DC | PRN

## 2022-07-27 MED ORDER — MAGNESIUM SULFATE 2 GM/50ML IV SOLN
2.0000 g | Freq: Once | INTRAVENOUS | Status: AC
  Administered 2022-07-27: 2 g via INTRAVENOUS
  Filled 2022-07-27: qty 50

## 2022-07-27 MED ORDER — LORATADINE 10 MG PO TABS
10.0000 mg | ORAL_TABLET | Freq: Every day | ORAL | Status: DC
  Administered 2022-07-27: 10 mg via ORAL
  Filled 2022-07-27: qty 1

## 2022-07-27 MED ORDER — ALUM & MAG HYDROXIDE-SIMETH 200-200-20 MG/5ML PO SUSP
15.0000 mL | Freq: Four times a day (QID) | ORAL | Status: DC | PRN
  Administered 2022-07-27: 15 mL via ORAL
  Filled 2022-07-27: qty 30

## 2022-07-27 MED ORDER — TRAMADOL HCL 50 MG PO TABS
50.0000 mg | ORAL_TABLET | Freq: Two times a day (BID) | ORAL | Status: DC | PRN
  Administered 2022-07-27 – 2022-07-28 (×2): 50 mg via ORAL
  Filled 2022-07-27 (×3): qty 1

## 2022-07-27 MED ORDER — MAGNESIUM HYDROXIDE 400 MG/5ML PO SUSP: 30.0000 mL | Freq: Every day | ORAL | Status: DC | PRN

## 2022-07-27 MED ORDER — HYDROXYZINE HCL 50 MG PO TABS
25.0000 mg | ORAL_TABLET | Freq: Three times a day (TID) | ORAL | Status: DC | PRN
  Administered 2022-07-27: 25 mg via ORAL
  Filled 2022-07-27: qty 1

## 2022-07-27 MED ORDER — FLUTICASONE PROPIONATE 50 MCG/ACT NA SUSP: 1.0000 | Freq: Every day | NASAL | Status: DC | PRN

## 2022-07-27 MED ORDER — ASPIRIN 81 MG PO CHEW
81.0000 mg | CHEWABLE_TABLET | Freq: Two times a day (BID) | ORAL | Status: DC
  Administered 2022-07-27: 81 mg via ORAL
  Filled 2022-07-27: qty 1

## 2022-07-27 MED ORDER — TRAMADOL HCL 50 MG PO TABS: 50.0000 mg | ORAL_TABLET | Freq: Two times a day (BID) | ORAL | Status: DC

## 2022-07-27 MED ORDER — PREDNISOLONE ACETATE 1 % OP SUSP
1.0000 [drp] | OPHTHALMIC | Status: DC
  Administered 2022-07-27: 1 [drp] via OPHTHALMIC
  Filled 2022-07-27: qty 1

## 2022-07-27 MED ORDER — TRAZODONE HCL 50 MG PO TABS: 25.0000 mg | ORAL_TABLET | Freq: Every evening | ORAL | Status: DC | PRN

## 2022-07-27 MED ORDER — PANTOPRAZOLE SODIUM 40 MG PO TBEC
40.0000 mg | DELAYED_RELEASE_TABLET | Freq: Two times a day (BID) | ORAL | Status: DC
  Administered 2022-07-27 – 2022-07-31 (×9): 40 mg via ORAL
  Filled 2022-07-27 (×9): qty 1

## 2022-07-27 NOTE — Progress Notes (Signed)
PHARMACY - PHYSICIAN COMMUNICATION CRITICAL VALUE ALERT - BLOOD CULTURE IDENTIFICATION (BCID)  Jodi Colon is an 89 y.o. female who presented to [facility] on 07/27/2022 with a chief complaint of diarrhea  Assessment:  blood culture from 2/27 with GNR, BCID detects E coli, ? GI source  Name of physician (or Provider) Contacted: Dr Edd Fabian  Current antibiotics: none  Changes to prescribed antibiotics recommended:  Recommendations accepted by provider  Results for orders placed or performed during the hospital encounter of 07/27/22  Blood Culture ID Panel (Reflexed) (Collected: 07/27/2022  4:32 AM)  Result Value Ref Range   Enterococcus faecalis NOT DETECTED NOT DETECTED   Enterococcus Faecium NOT DETECTED NOT DETECTED   Listeria monocytogenes NOT DETECTED NOT DETECTED   Staphylococcus species NOT DETECTED NOT DETECTED   Staphylococcus aureus (BCID) NOT DETECTED NOT DETECTED   Staphylococcus epidermidis NOT DETECTED NOT DETECTED   Staphylococcus lugdunensis NOT DETECTED NOT DETECTED   Streptococcus species NOT DETECTED NOT DETECTED   Streptococcus agalactiae NOT DETECTED NOT DETECTED   Streptococcus pneumoniae NOT DETECTED NOT DETECTED   Streptococcus pyogenes NOT DETECTED NOT DETECTED   A.calcoaceticus-baumannii NOT DETECTED NOT DETECTED   Bacteroides fragilis NOT DETECTED NOT DETECTED   Enterobacterales DETECTED (A) NOT DETECTED   Enterobacter cloacae complex NOT DETECTED NOT DETECTED   Escherichia coli DETECTED (A) NOT DETECTED   Klebsiella aerogenes NOT DETECTED NOT DETECTED   Klebsiella oxytoca NOT DETECTED NOT DETECTED   Klebsiella pneumoniae NOT DETECTED NOT DETECTED   Proteus species NOT DETECTED NOT DETECTED   Salmonella species NOT DETECTED NOT DETECTED   Serratia marcescens NOT DETECTED NOT DETECTED   Haemophilus influenzae NOT DETECTED NOT DETECTED   Neisseria meningitidis NOT DETECTED NOT DETECTED   Pseudomonas aeruginosa NOT DETECTED NOT DETECTED    Stenotrophomonas maltophilia NOT DETECTED NOT DETECTED   Candida albicans NOT DETECTED NOT DETECTED   Candida auris NOT DETECTED NOT DETECTED   Candida glabrata NOT DETECTED NOT DETECTED   Candida krusei NOT DETECTED NOT DETECTED   Candida parapsilosis NOT DETECTED NOT DETECTED   Candida tropicalis NOT DETECTED NOT DETECTED   Cryptococcus neoformans/gattii NOT DETECTED NOT DETECTED   CTX-M ESBL NOT DETECTED NOT DETECTED   Carbapenem resistance IMP NOT DETECTED NOT DETECTED   Carbapenem resistance KPC NOT DETECTED NOT DETECTED   Carbapenem resistance NDM NOT DETECTED NOT DETECTED   Carbapenem resist OXA 48 LIKE NOT DETECTED NOT DETECTED   Carbapenem resistance VIM NOT DETECTED NOT DETECTED    Doreene Eland, PharmD, BCPS, BCIDP Work Cell: [phone] 07/27/2022 4:44 PM

## 2022-07-27 NOTE — Progress Notes (Signed)
Pharmacy Antibiotic Note  Jodi Colon is a 89 y.o. female admitted on 07/27/2022 with diarrhea. Blood cultures with E coli.   Pharmacy has been consulted for aztreonam dosing. Patient with reported penicillin allergy - full body rash as an adult but > 10 years ago.  No documented cephalosporin use  Today,07/27/2022 Renal - AKI SCr 1.55 - improving WBC 16.3 Afebrile C. Difficile neg GI Panel - not collected  Plan: Based on penicillin allergy and current renal function, aztreonam 2gm IV q12h Follow renal function Await cultures and GI panel  Height: '4\' 9"'$  (144.8 cm) Weight: 54.4 kg (120 lb) IBW/kg (Calculated) : 38.6  Temp (24hrs), Avg:98.9 F (37.2 C), Min:98.2 F (36.8 C), Max:99.8 F (37.7 C)  Recent Labs  Lab 07/26/22 2126 07/26/22 2349 07/27/22 0432  WBC 16.3*  --  10.2  CREATININE 1.91*  --  1.55*  LATICACIDVEN  --  1.3  --     Estimated Creatinine Clearance: 17.1 mL/min (A) (by C-G formula based on SCr of 1.55 mg/dL (H)).    Allergies  Allergen Reactions   Penicillins Rash    Has patient had a PCN reaction causing immediate rash, facial/tongue/throat swelling, SOB or lightheadedness with hypotension: Yes Has patient had a PCN reaction causing severe rash involving mucus membranes or skin necrosis: No Has patient had a PCN reaction that required hospitalization: No Has patient had a PCN reaction occurring within the last 10 years: No If all of the above answers are "NO", then may proceed with Cephalosporin use.  Occurred as an adult & full body rash  Rash Rash Occurred as an adult & full body rash Has patient had a PCN reaction causing immediate rash, facial/tongue/throat swelling, SOB or lightheadedness with hypotension: Yes Has patient had a PCN reaction causing severe rash involving mucus membranes or skin necrosis: No Has patient had a PCN reaction that required hospitalization: No Has patient had a PCN reaction occurring within the last 10 years: No If  all of the above answers are "NO", then may proceed with Cephalosporin use. Has patient had a PCN reaction causing immediate rash, facial/tongue/throat swelling, SOB or lightheadedness with hypotension: Yes Has patient had a PCN reaction causing severe rash involving mucus membranes or skin necrosis: No Has patient had a PCN reaction that required hospitalization: No Has patient had a PCN reaction occurring within the last 10 years: No If all of the above answers are "NO", then may proceed with Cephalosporin use.  Occurred as an adult & full body rash Occurred as an adult & full body rash   Sulfa Antibiotics Swelling and Rash    Facial swelling   Sulfasalazine Swelling and Rash    "swelling of face"    Antimicrobials this admission: 2/27 aztreonam >>  Dose adjustments this admission:   Microbiology results: 2/27  BCx x 1: GNR in both bottles, BCID E coli   Thank you for allowing pharmacy to be a part of this patient's care.  Doreene Eland, PharmD, BCPS, BCIDP Work Cell: [phone] 07/27/2022 5:31 PM

## 2022-07-27 NOTE — Assessment & Plan Note (Signed)
-   We will continue PPI therapy. 

## 2022-07-27 NOTE — Assessment & Plan Note (Signed)
-   We will continue her antihypertensives. 

## 2022-07-27 NOTE — Plan of Care (Signed)
Patient was seen and examined at bedside, denies any abdominal pain at this time, no nausea vomiting.  Patient had diarrhea 2 episodes yesterday but no more diarrhea today.  Patient denies any dysuria, patient was resting comfortably. Still has low sodium 128, creatinine 1.55 improved.  We will continue current treatment.

## 2022-07-27 NOTE — Evaluation (Signed)
Occupational Therapy Evaluation Patient Details Name: Jodi Colon MRN: [id number] DOB: 1929/09/07 Today's Date: 07/27/2022   History of Present Illness Jodi Colon is a 89 y.o. female with medical history significant for essential hypertension, dyslipidemia, hypothyroidism, TIA, CVA s/p PTCA and MCA stent, small brain aneurysm being followed by regular angiograms, spinal stenosis, breast cancer status postlumpectomy and radiotherapy and GERD, who presented to the emergency room with acute onset of significant anorexia with upset stomach and watery diarrhea without vomiting.  Her diarrhea started on Saturday and was last on Sunday then flared again on Monday.  She was more somnolent per her daughter.  She has been feeling significantly weak and she had to be held by her family to avoid falling.  Her symptoms started over the weekend and since then she had dozens of episodes of watery diarrhea.  Her abdominal discomfort has been intermittent cramps that is currently better.   Clinical Impression   Patient presenting with decreased independence in self care, balance, functional mobility/transfers, and endurance. PTA pt lived alone, was independent for ADLs, received assistance for IADLs, and was Mod I for functional mobility using a rollator. Pt currently functioning at [place] for bed mobility, Min A for sit<>stand from various surfaces, and Min guard for step pivot transfer using RW. She required Max A for LB dressing, Min A for posterior hygiene, and Min guard for clothing management in standing. Pt will benefit from acute OT to increase overall independence in the areas of ADLs and functional mobility in order to safely discharge to next venue of care. OT recommends ongoing therapy upon discharge to maximize safety and independence with ADLs, functional mobility, decrease fall risk, and decrease caregiver burden.       Recommendations for follow up therapy are one component of a multi-disciplinary  discharge planning process, led by the attending physician.  Recommendations may be updated based on patient status, additional functional criteria and insurance authorization.   Follow Up Recommendations  Skilled nursing-short term rehab (<3 hours/day)     Assistance Recommended at Discharge Frequent or constant Supervision/Assistance  Patient can return home with the following A little help with walking and/or transfers;Assistance with cooking/housework;Assist for transportation;Help with stairs or ramp for entrance;A lot of help with bathing/dressing/bathroom    Functional Status Assessment  Patient has had a recent decline in their functional status and demonstrates the ability to make significant improvements in function in a reasonable and predictable amount of time.  Equipment Recommendations  Other (comment) (defer to next venue of care)    Recommendations for Other Services       Precautions / Restrictions Precautions Precautions: Fall Restrictions Weight Bearing Restrictions: No      Mobility Bed Mobility Overal bed mobility: Needs Assistance Bed Mobility: Supine to Sit     Supine to sit: HOB elevated, Min assist (for trunk elevation)     General bed mobility comments: Min A to scoot hips forward at EOB    Transfers Overall transfer level: Needs assistance Equipment used: Rolling walker (2 wheels) Transfers: Sit to/from Stand, Bed to chair/wheelchair/BSC Sit to Stand: Min assist (from EOB, recliner, BSC)     Step pivot transfers: Min guard (from EOB>recliner then recliner<>BSC)     General transfer comment: VC for hand placement, pt slightly unsteady upon standing      Balance Overall balance assessment: Needs assistance Sitting-balance support: Feet supported Sitting balance-Leahy Scale: Good     Standing balance support: Bilateral upper extremity supported, Single extremity supported,  During functional activity Standing balance-Leahy Scale: Fair                              ADL either performed or assessed with clinical judgement   ADL Overall ADL's : Needs assistance/impaired                 Upper Body Dressing : Minimal assistance;Sitting Upper Body Dressing Details (indicate cue type and reason): to don/doff gown Lower Body Dressing: Maximal assistance;Sitting/lateral leans Lower Body Dressing Details (indicate cue type and reason): to don socks 2/2 low back and R hip pain Toilet Transfer: Minimal assistance;Min guard;BSC/3in1;Rolling walker (2 wheels);Ambulation Toilet Transfer Details (indicate cue type and reason): short ambulatory transfer Toileting- Clothing Manipulation and Hygiene: Minimal assistance;Min guard Toileting - Clothing Manipulation Details (indicate cue type and reason): Min A for thoroughness of posterior hygiene after continent void on BSC, Min guard for clothing management of brief in standing             Vision Baseline Vision/History: 1 Wears glasses (readers) Patient Visual Report: No change from baseline       Perception     Praxis      Pertinent Vitals/Pain Pain Assessment Pain Assessment: Faces Faces Pain Scale: Hurts a little bit Pain Location: low back & R hip (arthritis per pt) Pain Descriptors / Indicators: Discomfort, Aching Pain Intervention(s): Limited activity within patient's tolerance, Monitored during session, Repositioned     Hand Dominance     Extremity/Trunk Assessment Upper Extremity Assessment Upper Extremity Assessment: Generalized weakness   Lower Extremity Assessment Lower Extremity Assessment: Generalized weakness       Communication Communication Communication: No difficulties   Cognition Arousal/Alertness: Awake/alert Behavior During Therapy: Flat affect Overall Cognitive Status: No family/caregiver present to determine baseline cognitive functioning                                 General Comments: Pt with h/o  TIA/CVA. Pt oriented to self/[place], grossly to time. Followed commands well.     General Comments       Exercises Other Exercises Other Exercises: OT provided education re: role of OT, OT POC, post acute recs, sitting up for all meals, EOB/OOB mobility with assistance, home/fall safety.      Shoulder Instructions      Home Living Family/patient expects to be discharged to:: Private residence Living Arrangements: Alone Available Help at Discharge: Family;Available PRN/intermittently (children live close by) Type of Home: House Home Access: Level entry     Home Layout: One level     Bathroom Shower/Tub: Occupational psychologist: Handicapped height     Home Equipment: Rollator (4 wheels);BSC/3in1;Grab bars - toilet;Grab bars - tub/shower;Other (comment) (adjustable bed with rails)          Prior Functioning/Environment Prior Level of Function : Needs assist       Physical Assist : ADLs (physical)   ADLs (physical): IADLs Mobility Comments: Mod I for household mobility using rollator ADLs Comments: Pt reports IND for ADLs, IND light meal prep, family provides transportation, groceries are delivered, has hired cleaners that come each month, medications come pre-packaged        OT Problem List: Decreased strength;Decreased activity tolerance;Impaired balance (sitting and/or standing);Pain;Decreased safety awareness      OT Treatment/Interventions: Self-care/ADL training;Therapeutic exercise;Neuromuscular education;Energy conservation;DME and/or AE instruction;Manual therapy;Modalities;Balance training;Patient/family education;Visual/perceptual remediation/compensation;Cognitive remediation/compensation;Therapeutic activities;Splinting  OT Goals(Current goals can be found in the care plan section) Acute Rehab OT Goals Patient Stated Goal: get stronger, return home OT Goal Formulation: With patient Time For Goal Achievement: 08/10/22 Potential to Achieve  Goals: Good   OT Frequency: Min 2X/week    Co-evaluation              AM-PAC OT "6 Clicks" Daily Activity     Outcome Measure Help from another person eating meals?: None Help from another person taking care of personal grooming?: A Little Help from another person toileting, which includes using toliet, bedpan, or urinal?: A Little Help from another person bathing (including washing, rinsing, drying)?: A Lot Help from another person to put on and taking off regular upper body clothing?: A Little Help from another person to put on and taking off regular lower body clothing?: A Lot 6 Click Score: 17   End of Session Equipment Utilized During Treatment: Gait belt;Rolling walker (2 wheels) Nurse Communication: Mobility status  Activity Tolerance: Patient tolerated treatment well;Patient limited by fatigue Patient left: in chair;with call bell/phone within reach;with chair alarm set  OT Visit Diagnosis: Unsteadiness on feet (R26.81);Muscle weakness (generalized) (M62.81);Pain Pain - Right/Left: Right Pain - part of body: Leg (LBP)                Time: 1536-1610 OT Time Calculation (min): 34 min Charges:  OT General Charges $OT Visit: 1 Visit OT Evaluation $OT Eval Low Complexity: 1 Low  [facility] MS, OTR/L ascom [phone]  07/27/22, 5:47 PM

## 2022-07-27 NOTE — H&P (Signed)
[place]   PATIENT NAME: Jodi Colon    MR#:  [id number]  DATE OF BIRTH:  08-04-1929  DATE OF ADMISSION:  07/27/2022  PRIMARY CARE PHYSICIAN: Jennette Dubin, NP   Patient is coming from: Home  REQUESTING/REFERRING PHYSICIAN: Hinda Kehr, MD  CHIEF COMPLAINT:   Chief Complaint  Patient presents with   Diarrhea    HISTORY OF PRESENT ILLNESS:  Jodi Colon is a 89 y.o. female with medical history significant for essential hypertension, dyslipidemia, hypothyroidism, TIA, CVA s/p PTCA and MCA stent, small brain aneurysm being followed by regular angiograms, spinal stenosis, breast cancer status postlumpectomy and radiotherapy and GERD, who presented to the emergency room with acute onset of significant anorexia with upset stomach and watery diarrhea without vomiting.  Her diarrhea started on Saturday and was last on Sunday then flared again on Monday.  She was more somnolent per her daughter.  She has been feeling significantly weak and she had to be held by her family to avoid falling.  Her symptoms started over the weekend and since then she had dozens of episodes of watery diarrhea.  Her abdominal discomfort has been intermittent cramps that is currently better.  No chest pain or palpitations.  No cough or wheezing or dyspnea.  No dysuria, oliguria, urinary frequency or urgency or flank pain.  She denied taking any recent antibiotics.  No reported fever however she had some chills.  ED Course: When she came to the ER, BP was 145/79 with respiratory rate of 24 and otherwise normal vital signs.  Labs revealed mild hyponatremia 134 and albumin of 3.4, AST 57 and total protein of 6.3 with otherwise unremarkable CMP.  CBC showed anemia slightly worse than previous levels in 2019 with hemoglobin 9.2 hematocrit 30.6 compared to 10.1/29.7 on 11/10/2017.  UA came back negative.  Imaging: Abdominal pelvic CT scan revealed large gastric hernia, sigmoid diverticulosis, 2 mm  nonobstructing left renal calculus and marked severity scoliosis of the lumbar spine with marked severity multilevel degenerative changes and aortic atherosclerosis.  The patient was given 1 L bolus of IV normal saline and 1 L IV lactated Ringer.  She will be admitted to a medical bed for further evaluation and management. PAST MEDICAL HISTORY:   Past Medical History:  Diagnosis Date   Adnexal cyst    Arthritis    Bladder cancer ([place]) 04/2015   Brain aneurysm    small, followed with regular angiograms, Dr. Estanislado Pandy   Breast cancer [facility]) 2007   DCIS-Right, s/p lumpectomy/xrt   Cerebrovascular disease 2008   s/p PTCA/stent of MCA, [place]   Chronic kidney disease    Complication of anesthesia    2003- trouble waking up after fracture surgery, no issues with anesthesia since   DDD (degenerative disc disease), cervical    Degenerative joint disease    [facility] by [facility]   GERD (gastroesophageal reflux disease)    Headache    History of hiatal hernia    History of MRSA infection    History of TIA (transient ischemic attack)    Hx MRSA infection    Hyperlipidemia    Hypertension    Hypothyroidism    Incomplete bladder emptying    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 ([place])    MRSA (methicillin resistant Staphylococcus aureus) 2008   perineal abscess   Nocturia    Pneumonia    Radiation 2007   BREAST CA   Spinal stenosis  Stroke [facility])    Thyroid disease    Urge incontinence     PAST SURGICAL HISTORY:   Past Surgical History:  Procedure Laterality Date   ABDOMINAL HYSTERECTOMY     AQUEOUS SHUNT Left 01/05/2016   Procedure: AQUEOUS SHUNT;  Surgeon: Ronnell Freshwater, MD;  Location: [place];  Service: Ophthalmology;  Laterality: Left;  AHMED VALVE WITH SCLERAL PATCH GRAFT   AQUEOUS SHUNT Right 07/19/2016   Procedure: AQUEOUS SHUNT;  Surgeon: Ronnell Freshwater, MD;  Location: [place];  Service: Ophthalmology;   Laterality: Right;  Ahmed tube shunt   BACK SURGERY     BLADDER SURGERY  2012, 2014   BRAIN SURGERY Right 2008   stent, [facility], [place]   BREAST LUMPECTOMY Right 2007   DCIS, Lumpectomy F/U radiation    CARPAL TUNNEL RELEASE Left 07/2017   CATARACT EXTRACTION     CHOLECYSTECTOMY     COLONOSCOPY  2013   CYSTOSCOPY W/ RETROGRADES Bilateral 05/20/2015   Procedure: CYSTOSCOPY WITH RETROGRADE PYELOGRAM;  Surgeon: Hollice Espy, MD;  Location: [facility];  Service: Urology;  Laterality: Bilateral;   CYSTOSCOPY W/ RETROGRADES Bilateral 12/24/2015   Procedure: CYSTOSCOPY WITH RETROGRADE PYELOGRAM;  Surgeon: Hollice Espy, MD;  Location: [facility];  Service: Urology;  Laterality: Bilateral;   CYSTOSCOPY WITH BIOPSY N/A 05/20/2015   Procedure: CYSTOSCOPY WITH BIOPSY;  Surgeon: Hollice Espy, MD;  Location: [facility];  Service: Urology;  Laterality: N/A;   CYSTOSCOPY WITH BIOPSY N/A 12/24/2015   Procedure: CYSTOSCOPY WITH BIOPSY;  Surgeon: Hollice Espy, MD;  Location: [facility];  Service: Urology;  Laterality: N/A;   EYE SURGERY Bilateral    Cataract Extraction, glaucoma surgery 2018, 2019   FRACTURE SURGERY Left    Ankle, compound fracture   HEMORRHOID SURGERY  02/01/13   LAPAROSCOPIC HYSTERECTOMY     TONSILLECTOMY     TOTAL HIP ARTHROPLASTY Left 11/08/2017   Procedure: TOTAL HIP ARTHROPLASTY ANTERIOR APPROACH;  Surgeon: Hessie Knows, MD;  Location: [facility];  Service: Orthopedics;  Laterality: Left;    SOCIAL HISTORY:   Social History   Tobacco Use   Smoking status: Never   Smokeless tobacco: Never  Substance Use Topics   Alcohol use: No    FAMILY HISTORY:   Family History  Problem Relation Age of Onset   Fibromyalgia Daughter    Breast cancer Neg Hx     DRUG ALLERGIES:   Allergies  Allergen Reactions   Penicillins Rash    Has patient had a PCN reaction causing immediate rash, facial/tongue/throat swelling, SOB or lightheadedness with hypotension: Yes Has patient had  a PCN reaction causing severe rash involving mucus membranes or skin necrosis: No Has patient had a PCN reaction that required hospitalization: No Has patient had a PCN reaction occurring within the last 10 years: No If all of the above answers are "NO", then may proceed with Cephalosporin use.  Occurred as an adult & full body rash  Rash Rash Occurred as an adult & full body rash Has patient had a PCN reaction causing immediate rash, facial/tongue/throat swelling, SOB or lightheadedness with hypotension: Yes Has patient had a PCN reaction causing severe rash involving mucus membranes or skin necrosis: No Has patient had a PCN reaction that required hospitalization: No Has patient had a PCN reaction occurring within the last 10 years: No If all of the above answers are "NO", then may proceed with Cephalosporin use. Has patient had a PCN reaction causing immediate rash, facial/tongue/throat swelling, SOB or  lightheadedness with hypotension: Yes Has patient had a PCN reaction causing severe rash involving mucus membranes or skin necrosis: No Has patient had a PCN reaction that required hospitalization: No Has patient had a PCN reaction occurring within the last 10 years: No If all of the above answers are "NO", then may proceed with Cephalosporin use.  Occurred as an adult & full body rash Occurred as an adult & full body rash   Sulfa Antibiotics Swelling and Rash    Facial swelling   Sulfasalazine Swelling and Rash    "swelling of face"    REVIEW OF SYSTEMS:   ROS As per history of present illness. All pertinent systems were reviewed above. Constitutional, HEENT, cardiovascular, respiratory, GI, GU, musculoskeletal, neuro, psychiatric, endocrine, integumentary and hematologic systems were reviewed and are otherwise negative/unremarkable except for positive findings mentioned above in the HPI.   MEDICATIONS AT HOME:   Prior to Admission medications   Medication Sig Start Date End  Date Taking? Authorizing Provider  amLODipine (NORVASC) 2.5 MG tablet Take 2.5 mg by mouth daily.    [provider]  aspirin 81 MG chewable tablet Chew 1 tablet (81 mg total) by mouth 2 (two) times daily. 11/10/17   Duanne Guess, PA-C  atorvastatin (LIPITOR) 10 MG tablet Take 10 mg by mouth every morning.     [provider]  cetirizine (ZYRTEC) 10 MG tablet Take 10 mg by mouth daily as needed for allergies.    [provider]  cyanocobalamin 1000 MCG tablet Take 1,000 mcg by mouth daily. 08/07/18   [provider]  erythromycin ophthalmic ointment Place 1 application into the right eye at bedtime.    [provider]  fluticasone (FLONASE) 50 MCG/ACT nasal spray Place 1 spray into both nostrils daily as needed for allergies or rhinitis.    [provider]  Hypromellose (VISTA GEL DRY EYE RELIEF OP) Apply 1-2 drops to eye daily as needed (dry eyes).    [provider]  iron polysaccharides (NIFEREX) 150 MG capsule Take 150 mg by mouth 2 (two) times daily.  09/17/19   [provider]  levothyroxine (SYNTHROID, LEVOTHROID) 50 MCG tablet Take 50-100 mcg by mouth See admin instructions. Take 1 tablet (55mg) by mouth daily in the morning before breakfast except Sunday. Take 2 tablets (1069m) by mouth daily in the morning before breakfast on Sunday. 11/23/13   [provider]  loperamide (IMODIUM) 2 MG capsule Take 2 mg by mouth 4 (four) times daily as needed for diarrhea or loose stools.    [provider]  Multiple Vitamin (MULTIVITAMIN) tablet Take 1 tablet by mouth daily.      [provider]  pantoprazole (PROTONIX) 40 MG tablet Take 40 mg by mouth 2 (two) times daily.  06/19/13   [provider]  prednisoLONE acetate (PRED FORTE) 1 % ophthalmic suspension Place 1 drop into the right eye See admin instructions. Place 1 drop into the right eye 8 times daily. 11/15/19   [provider]   Probiotic Product (PROBIOTIC PO) Take 1 capsule by mouth daily.    [provider]  senna-docusate (SENOKOT-S) 8.6-50 MG tablet Take 1 tablet by mouth at bedtime as needed for mild constipation. 11/09/17   GaDuanne GuessPA-C  timolol (TIMOPTIC) 0.5 % ophthalmic solution Place 1 drop into both eyes 2 (two) times daily.  10/22/15   [provider]  venlafaxine XR (EFFEXOR-XR) 75 MG 24 hr capsule Take 37.5 mg by mouth daily  with breakfast.     [provider]      VITAL SIGNS:  Blood pressure (!) 146/72, pulse (!) 123, temperature 98.7 F (37.1 C), resp. rate 18, height '4\' 9"'$  (1.448 m), weight 54.4 kg, SpO2 97 %.  PHYSICAL EXAMINATION:  Physical Exam  GENERAL:  89 y.o.-year-old Caucasian female patient lying in the bed with no acute distress.  She was somnolent but easily arousable. EYES: Pupils equal, round, reactive to light and accommodation. No scleral icterus. Extraocular muscles intact.  HEENT: Head atraumatic, normocephalic. Oropharynx with dry mucous membrane and tongue and nasopharynx clear.  NECK:  Supple, no jugular venous distention. No thyroid enlargement, no tenderness.  LUNGS: Normal breath sounds bilaterally, no wheezing, rales,rhonchi or crepitation. No use of accessory muscles of respiration.  CARDIOVASCULAR: Regular rate and rhythm, S1, S2 normal. No murmurs, rubs, or gallops.  ABDOMEN: Soft, nondistended, nontender. Bowel sounds present. No organomegaly or mass.  EXTREMITIES: No pedal edema, cyanosis, or clubbing.  NEUROLOGIC: Cranial nerves II through XII are intact. Muscle strength 5/5 in all extremities. Sensation intact. Gait not checked.  PSYCHIATRIC: The patient is alert and oriented x 3.  Normal affect and good eye contact. SKIN: No obvious rash, lesion, or ulcer.   LABORATORY PANEL:   CBC Recent Labs  Lab 07/27/22 0432  WBC 10.2  HGB 10.9*  HCT 32.4*  PLT 104*    ------------------------------------------------------------------------------------------------------------------  Chemistries  Recent Labs  Lab 07/26/22 2126 07/27/22 0432  NA 126* 128*  K 3.8 3.5  CL 90* 98  CO2 24 21*  GLUCOSE 126* 113*  BUN 35* 33*  CREATININE 1.91* 1.55*  CALCIUM 8.3* 8.0*  AST 40  --   ALT 23  --   ALKPHOS 105  --   BILITOT 1.0  --    ------------------------------------------------------------------------------------------------------------------  Cardiac Enzymes No results for input(s): "TROPONINI" in the last 168 hours. ------------------------------------------------------------------------------------------------------------------  RADIOLOGY:  CT ABDOMEN PELVIS WO CONTRAST  Result Date: 07/27/2022 CLINICAL DATA:  Diarrhea. EXAM: CT ABDOMEN AND PELVIS WITHOUT CONTRAST TECHNIQUE: Multidetector CT imaging of the abdomen and pelvis was performed following the standard protocol without IV contrast. RADIATION DOSE REDUCTION: This exam was performed according to the departmental dose-optimization program which includes automated exposure control, adjustment of the mA and/or kV according to patient size and/or use of iterative reconstruction technique. COMPARISON:  July 08, 2016 FINDINGS: Lower chest: Mild atelectasis is seen within the posterior aspects of the bilateral lung bases. Hepatobiliary: No focal liver abnormality is seen. Status post cholecystectomy. No biliary dilatation. Pancreas: Unremarkable. No pancreatic ductal dilatation or surrounding inflammatory changes. Spleen: Normal in size without focal abnormality. Adrenals/Urinary Tract: Adrenal glands are unremarkable. Kidneys are normal, without obstructing renal calculi, focal lesion, or hydronephrosis. A 2 mm nonobstructing renal calculus is seen within the mid left kidney. The urinary bladder is poorly distended and subsequently limited in evaluation. Bilateral nonspecific perinephric  inflammatory fat stranding is noted. Mild diffuse urinary bladder wall thickening is seen. Stomach/Bowel: There is a large gastric hernia. The appendix is not clearly identified. No evidence of bowel wall thickening, distention, or inflammatory changes. Noninflamed diverticula are seen throughout the sigmoid colon. Vascular/Lymphatic: Aortic atherosclerosis. No enlarged abdominal or pelvic lymph nodes. Reproductive: Status post hysterectomy. No adnexal masses. Other: A 3.2 cm x 1.7 cm x 3.1 cm midline, fat containing ventral hernia is seen within the mid to upper anterior abdominal wall. No abdominopelvic ascites. Musculoskeletal: There is a total left hip replacement with associated streak artifact and subsequently limited evaluation  of the adjacent osseous and soft tissue structures. There is marked severity scoliosis of the lumbar spine with marked severity multilevel degenerative changes. IMPRESSION: 1. Large gastric hernia. 2. Sigmoid diverticulosis. 3. 2 mm nonobstructing left renal calculus. 4. Marked severity scoliosis of the lumbar spine with marked severity multilevel degenerative changes. 5. Aortic atherosclerosis. Aortic Atherosclerosis (ICD10-I70.0). Electronically Signed   By: Virgina Norfolk M.D.   On: 07/27/2022 00:45      IMPRESSION AND PLAN:  Assessment and Plan: * Hyponatremia - This is likely hypovolemic from anorexia and diarrhea. - She will be admitted to a medical bed. - We will continue hydration with IV normal saline. - Will obtain hyponatremia workup. - We will follow BMPs.  Intractable diarrhea - This could be related to viral enteritis. - She will be hydrated with IV normal saline as mentioned above. - Stool pathogens and C. difficile were ordered by EDP. - Will utilize Imodium after obtaining a stool sample.  AKI (acute kidney injury) ([place]) - This is likely prerenal due to volume depletion and dehydration. - We will hydrate with IV normal saline and follow BMP. -  We will avoid nephrotoxins.  Hypothyroidism - We will continue her Synthroid.  Depression - We will continue her Effexor XR.  GERD without esophagitis - We will continue PPI therapy.  Dyslipidemia - We will continue her statin therapy.  Essential hypertension - We will continue her antihypertensives.       DVT prophylaxis: Lovenox.  Advanced Care Planning:  Code Status: She is DNR [facility].  This was discussed with her daughter.  She has an out of facility DNR form. Family Communication:  The plan of care was discussed in details with the patient (and family). I answered all questions. The patient agreed to proceed with the above mentioned plan. Further management will depend upon hospital course. Disposition Plan: Back to previous home environment Consults called: none.  All the records are reviewed and case discussed with ED provider.  Status is: Inpatient    At the time of the admission, it appears that the appropriate admission status for this patient is inpatient.  This is judged to be reasonable and necessary in order to provide the required intensity of service to ensure the patient's safety given the presenting symptoms, physical exam findings and initial radiographic and laboratory data in the context of comorbid conditions.  The patient requires inpatient status due to high intensity of service, high risk of further deterioration and high frequency of surveillance required.  I certify that at the time of admission, it is my clinical judgment that the patient will require inpatient hospital care extending more than 2 midnights.                            Dispo: The patient is from: Home              Anticipated d/c is to: Home              Patient currently is not medically stable to d/c.              Difficult to place patient: No  Christel Mormon M.D on 07/27/2022 at 5:57 AM  [facility]   From 7 PM-7 AM, contact night-coverage [email or web address]  CC: Primary care  physician; Jennette Dubin, NP

## 2022-07-27 NOTE — Assessment & Plan Note (Signed)
-   This is likely hypovolemic from anorexia and diarrhea. - She will be admitted to a medical bed. - We will continue hydration with IV normal saline. - Will obtain hyponatremia workup. - We will follow BMPs.

## 2022-07-27 NOTE — Assessment & Plan Note (Addendum)
-   We will continue her Effexor XR. 

## 2022-07-27 NOTE — Assessment & Plan Note (Addendum)
-   We will continue her Synthroid.

## 2022-07-27 NOTE — ED Provider Notes (Signed)
[facility] Provider Note    Event Date/Time   First MD Initiated Contact with Patient 07/27/22 0111     (approximate)   History   Diarrhea   HPI  Jodi Colon is a 89 y.o. female who is generally active and independent for her age.  She presents with her daughter for evaluation of persistent diarrhea for the last 3 days.  It started acutely over the weekend and she has had dozens of episodes of watery diarrhea during that time.  She has had no nausea or vomiting but has had difficulty tolerating oral intake because of feeling ill in general and having some stomach cramping.  She denies pain other than intermittent stomach cramps which is better right now.  She has had no shortness of breath or fever.  She is unaware of any bad food contacts recently and has not been around anyone else who has had GI issues.  She has not recently taken any antibiotics.  She has had no recent dysuria.  She reports that by this afternoon she felt so weak she was unable to get up and needed support from her daughters to keep her from falling, even just to get off the toilet.     Physical Exam   Triage Vital Signs: ED Triage Vitals  Enc Vitals Group     BP 07/26/22 2144 (!) 145/79     Pulse Rate 07/26/22 2144 100     Resp 07/26/22 2144 (!) 24     Temp 07/26/22 2144 99.8 F (37.7 C)     Temp Source 07/26/22 2144 Oral     SpO2 07/26/22 2152 95 %     Weight 07/26/22 2120 54.4 kg (120 lb)     Height 07/26/22 2120 1.448 m ('4\' 9"'$ )     Head Circumference --      Peak Flow --      Pain Score 07/26/22 2119 10     Pain Loc --      Pain Edu? --      Excl. in [place]? --     Most recent vital signs: Vitals:   07/27/22 0130 07/27/22 0200  BP: (!) 130/50 (!) 141/124  Pulse: (!) 103 (!) 130  Resp: 19 19  Temp:    SpO2: 97% 95%     General: Awake, alert, no significant distress. CV:  Good peripheral perfusion.  Tachycardia, regular rhythm. Resp:  Normal effort. Speaking  easily and comfortably, no accessory muscle usage nor intercostal retractions.   Abd:  No distention.  Very minimal generalized tenderness to palpation of the lower abdomen, no upper abdominal tenderness.   ED Results / Procedures / Treatments   Labs (all labs ordered are listed, but only abnormal results are displayed) Labs Reviewed  COMPREHENSIVE METABOLIC PANEL - Abnormal; Notable for the following components:      Result Value   Sodium 126 (*)    Chloride 90 (*)    Glucose, Bld 126 (*)    BUN 35 (*)    Creatinine, Ser 1.91 (*)    Calcium 8.3 (*)    Albumin 3.3 (*)    GFR, Estimated 25 (*)    All other components within normal limits  CBC - Abnormal; Notable for the following components:   WBC 16.3 (*)    Platelets 137 (*)    All other components within normal limits  URINALYSIS, ROUTINE W REFLEX MICROSCOPIC - Abnormal; Notable for the following components:   Color, Urine YELLOW (*)  APPearance TURBID (*)    Hgb urine dipstick MODERATE (*)    Protein, ur 100 (*)    Leukocytes,Ua LARGE (*)    Bacteria, UA MANY (*)    Non Squamous Epithelial PRESENT (*)    All other components within normal limits  RESP PANEL BY RT-PCR (RSV, FLU A&B, COVID)  RVPGX2  CULTURE, BLOOD (SINGLE)  GASTROINTESTINAL PANEL BY PCR, STOOL (REPLACES STOOL CULTURE)  C DIFFICILE QUICK SCREEN W PCR REFLEX    LIPASE, BLOOD  LACTIC ACID, PLASMA  CBC  CREATININE, SERUM  BASIC METABOLIC PANEL  CBC  TSH  NA AND K (SODIUM & POTASSIUM), RAND UR  OSMOLALITY, URINE  OSMOLALITY     RADIOLOGY I viewed and interpreted the patient's CT of the abdomen and pelvis.  I see no obvious sign of infection.  Radiologist agrees, she has a large gastric hernia but no acute abnormalities.    PROCEDURES:  Critical Care performed: No  .1-3 Lead EKG Interpretation  Performed by: Hinda Kehr, MD Authorized by: Hinda Kehr, MD     Interpretation: abnormal     ECG rate:  110   ECG rate assessment:  tachycardic     Rhythm: sinus tachycardia     Ectopy: none     Conduction: normal      MEDICATIONS ORDERED IN ED: Medications  0.9 %  sodium chloride infusion (has no administration in time range)  acetaminophen (TYLENOL) tablet 650 mg (has no administration in time range)    Or  acetaminophen (TYLENOL) suppository 650 mg (has no administration in time range)  traZODone (DESYREL) tablet 25 mg (has no administration in time range)  magnesium hydroxide (MILK OF MAGNESIA) suspension 30 mL (has no administration in time range)  ondansetron (ZOFRAN) tablet 4 mg (has no administration in time range)    Or  ondansetron (ZOFRAN) injection 4 mg (has no administration in time range)  heparin injection 5,000 Units (has no administration in time range)  sodium chloride 0.9 % bolus 1,000 mL (0 mLs Intravenous Stopped 07/27/22 0121)  lactated ringers bolus 1,000 mL (1,000 mLs Intravenous New Bag/Given 07/27/22 0150)     IMPRESSION / MDM / ASSESSMENT AND PLAN / ED COURSE  I reviewed the triage vital signs and the nursing notes.                              Differential diagnosis includes, but is not limited to, probable infectious diarrhea, acute intra-abdominal infection such as diverticulitis or appendicitis, foodborne pathogen, electrolyte or metabolic disturbance, renal failure.  Patient's presentation is most consistent with acute presentation with potential threat to life or bodily function.  Labs/studies ordered: CT abdomen/pelvis without contrast, CBC, comprehensive metabolic panel, lipase, urinalysis, respiratory viral panel, blood culture, lactic acid, C. difficile panel, GI pathogen panel Interventions/Medications given: Normal saline 1 L IV, LR 1 L IV [facility] Course my include additional interventions not listed in this section:)  Patient was a little bit tachypneic but I believe that was situational because she is reporting no respiratory distress and her respiratory exam is  reassuring on exam.  She is tachycardic consistent with volume depletion.  Lab work is notable for what is likely volume depletion hyponatremia as well as acute renal failure with creatinine of 1.9 (her baseline seems to be around 0.6-0.7).  She has already received 1 L of normal saline I am ordering a second liter of LR.  Given her volume depletion with acute  renal failure and generalized weakness to the point that she cannot support herself, likely contributed by her hyponatremia, I feel that it is most appropriate for her to stay in the hospital for gentle rehydration, especially because although the diarrhea is improving, it is not yet stopped.  We have not been able to get a specimen yet but we will check C. difficile and GI pathogen panel when she provides a specimen.  I do not believe she is "septic" with a bacterial process and I do not think she would benefit from antibiotics.  I think she most likely has a viral GI pathogen that is led to volume depletion and her leukocytosis is secondary to her abdominal infection but not a bacterial process requiring antibiotics (CT scan was reassuring).  The patient and her daughter agree with the plan for admission.  The patient is on the cardiac monitor to evaluate for evidence of arrhythmia and/or significant heart rate changes.   Clinical Course as of 07/27/22 0207  Tue Jul 27, 2022  0132 Consulted hospitalist for admission [CF]  0207 Consulted Dr. Sidney Ace with the hospitalist service who will admit the patient [CF]    Clinical Course User Index [CF] Hinda Kehr, MD     FINAL CLINICAL IMPRESSION(S) / ED DIAGNOSES   Final diagnoses:  Acute renal failure, unspecified acute renal failure type ([place])  Diarrhea, unspecified type  Generalized weakness     Rx / DC Orders   ED Discharge Orders     None        Note:  This document was prepared using Dragon voice recognition software and may include unintentional dictation errors.    Hinda Kehr, MD 07/27/22 [phone]

## 2022-07-27 NOTE — Progress Notes (Signed)
Anticoagulation monitoring(Lovenox):  89 yo female ordered Lovenox 40 mg Q24h    Filed Weights   07/26/22 2120  Weight: 54.4 kg (120 lb)   BMI 26   Lab Results  Component Value Date   CREATININE 1.91 (H) 07/26/2022   CREATININE 0.82 12/06/2019   CREATININE 0.67 11/10/2017   Estimated Creatinine Clearance: 13.9 mL/min (A) (by C-G formula based on SCr of 1.91 mg/dL (H)). Hemoglobin & Hematocrit     Component Value Date/Time   HGB 12.5 07/26/2022 2126   HGB 13.0 09/19/2013 2126   HCT 36.9 07/26/2022 2126   HCT 37.3 09/19/2013 2126     Per Protocol for Patient with estCrcl < 15 ml/min and BMI < 30, will transition to  heparin 5000 units SQ Q8H.

## 2022-07-27 NOTE — Assessment & Plan Note (Signed)
-   We will continue her statin therapy. 

## 2022-07-27 NOTE — Assessment & Plan Note (Signed)
-   This could be related to viral enteritis. - She will be hydrated with IV normal saline as mentioned above. - Stool pathogens and C. difficile were ordered by EDP. - Will utilize Imodium after obtaining a stool sample.

## 2022-07-27 NOTE — Assessment & Plan Note (Signed)
-   This is likely prerenal due to volume depletion and dehydration. - We will hydrate with IV normal saline and follow BMP. - We will avoid nephrotoxins.

## 2022-07-28 DIAGNOSIS — N390 Urinary tract infection, site not specified: Secondary | ICD-10-CM

## 2022-07-28 DIAGNOSIS — R7881 Bacteremia: Secondary | ICD-10-CM

## 2022-07-28 DIAGNOSIS — E871 Hypo-osmolality and hyponatremia: Secondary | ICD-10-CM | POA: Diagnosis not present

## 2022-07-28 DIAGNOSIS — B9620 Unspecified Escherichia coli [E. coli] as the cause of diseases classified elsewhere: Secondary | ICD-10-CM

## 2022-07-28 LAB — BASIC METABOLIC PANEL
Anion gap: 8 (ref 5–15)
Anion gap: 8 (ref 5–15)
BUN: 30 mg/dL — ABNORMAL HIGH (ref 8–23)
BUN: 28 mg/dL — ABNORMAL HIGH (ref 8–23)
CO2: 23 mmol/L (ref 22–32)
CO2: 21 mmol/L — ABNORMAL LOW (ref 22–32)
Calcium: 7.8 mg/dL — ABNORMAL LOW (ref 8.9–10.3)
Calcium: 7.8 mg/dL — ABNORMAL LOW (ref 8.9–10.3)
Chloride: 101 mmol/L (ref 98–111)
Chloride: 103 mmol/L (ref 98–111)
Creatinine, Ser: 1.32 mg/dL — ABNORMAL HIGH (ref 0.44–1.00)
Creatinine, Ser: 1.18 mg/dL — ABNORMAL HIGH (ref 0.44–1.00)
GFR, Estimated: 38 mL/min — ABNORMAL LOW (ref >60)
GFR, Estimated: 44 mL/min — ABNORMAL LOW (ref >60)
Glucose, Bld: 119 mg/dL — ABNORMAL HIGH (ref 70–99)
Glucose, Bld: 142 mg/dL — ABNORMAL HIGH (ref 70–99)
Potassium: 2.8 mmol/L — ABNORMAL LOW (ref 3.5–5.1)
Potassium: 4 mmol/L (ref 3.5–5.1)
Sodium: 132 mmol/L — ABNORMAL LOW (ref 135–145)
Sodium: 132 mmol/L — ABNORMAL LOW (ref 135–145)

## 2022-07-28 LAB — CBC
HCT: 29.2 % — ABNORMAL LOW (ref 36.0–46.0)
Hemoglobin: 9.9 g/dL — ABNORMAL LOW (ref 12.0–15.0)
MCH: 30 pg (ref 26.0–34.0)
MCHC: 33.9 g/dL (ref 30.0–36.0)
MCV: 88.5 fL (ref 80.0–100.0)
Platelets: 105 10*3/uL — ABNORMAL LOW (ref 150–400)
RBC: 3.3 MIL/uL — ABNORMAL LOW (ref 3.87–5.11)
RDW: 13.3 % (ref 11.5–15.5)
WBC: 7.9 10*3/uL (ref 4.0–10.5)
nRBC: 0 % (ref 0.0–0.2)

## 2022-07-28 LAB — URINALYSIS, COMPLETE (UACMP) WITH MICROSCOPIC
Bacteria, UA: NONE SEEN
Bilirubin Urine: NEGATIVE
Glucose, UA: NEGATIVE mg/dL
Ketones, ur: NEGATIVE mg/dL
Nitrite: NEGATIVE
Protein, ur: 30 mg/dL — AB
RBC / HPF: >50 RBC/hpf (ref 0–5)
Specific Gravity, Urine: 1.014 (ref 1.005–1.030)
WBC, UA: >50 WBC/hpf (ref 0–5)
pH: 5 (ref 5.0–8.0)

## 2022-07-28 LAB — TROPONIN I (HIGH SENSITIVITY)
Troponin I (High Sensitivity): 137 ng/L (ref <18)
Troponin I (High Sensitivity): 144 ng/L (ref <18)

## 2022-07-28 LAB — BRAIN NATRIURETIC PEPTIDE: B Natriuretic Peptide: 866.2 pg/mL — ABNORMAL HIGH (ref 0.0–100.0)

## 2022-07-28 LAB — MAGNESIUM
Magnesium: 2.3 mg/dL (ref 1.7–2.4)
Magnesium: 2.2 mg/dL (ref 1.7–2.4)

## 2022-07-28 LAB — PHOSPHORUS: Phosphorus: 3.6 mg/dL (ref 2.5–4.6)

## 2022-07-28 MED ORDER — MELATONIN 5 MG PO TABS
5.0000 mg | ORAL_TABLET | Freq: Every day | ORAL | Status: DC
  Administered 2022-07-28 – 2022-07-30 (×3): 5 mg via ORAL
  Filled 2022-07-28 (×3): qty 1

## 2022-07-28 MED ORDER — METOPROLOL TARTRATE 5 MG/5ML IV SOLN
5.0000 mg | Freq: Four times a day (QID) | INTRAVENOUS | Status: DC
  Administered 2022-07-28 – 2022-07-29 (×2): 5 mg via INTRAVENOUS
  Filled 2022-07-28 (×2): qty 5

## 2022-07-28 MED ORDER — ROPINIROLE HCL 0.25 MG PO TABS
0.2500 mg | ORAL_TABLET | Freq: Two times a day (BID) | ORAL | Status: DC
  Administered 2022-07-28 – 2022-07-31 (×6): 0.25 mg via ORAL
  Filled 2022-07-28 (×7): qty 1

## 2022-07-28 MED ORDER — VITAMIN D (ERGOCALCIFEROL) 1.25 MG (50000 UNIT) PO CAPS
50000.0000 [IU] | ORAL_CAPSULE | ORAL | Status: DC
  Administered 2022-07-28: 50000 [IU] via ORAL
  Filled 2022-07-28: qty 1

## 2022-07-28 MED ORDER — POTASSIUM CHLORIDE CRYS ER 20 MEQ PO TBCR
40.0000 meq | EXTENDED_RELEASE_TABLET | ORAL | Status: AC
  Administered 2022-07-28 (×2): 40 meq via ORAL
  Filled 2022-07-28 (×2): qty 2

## 2022-07-28 MED ORDER — POTASSIUM CHLORIDE 10 MEQ/100ML IV SOLN
10.0000 meq | INTRAVENOUS | Status: AC
  Administered 2022-07-28 (×4): 10 meq via INTRAVENOUS
  Filled 2022-07-28: qty 100

## 2022-07-28 MED ORDER — SODIUM CHLORIDE 0.9 % IV SOLN
2.0000 g | INTRAVENOUS | Status: DC
  Administered 2022-07-28 – 2022-07-31 (×3): 2 g via INTRAVENOUS
  Filled 2022-07-28 (×2): qty 20
  Filled 2022-07-28 (×2): qty 2

## 2022-07-28 NOTE — Progress Notes (Addendum)
       CROSS COVER NOTE  NAME: Jodi Colon MRN: [id number] DOB : [birth year]    HPI/Events of Note   "Patient ahad a 20 beat run of SVT, VSS"  Assessment and  Interventions   Assessment: Patient with no significant cardiac history, admitted with E. coli gastroenteritis and bacteremia, was hyponatremic and hypokalemic but hypokalemia resolved.  Had an asymptomatic 20 beat run of SVT  Plan: potassium, magnesium and correct, EKG, troponin and BNP Echocardiogram in the a.m. Metoprolol IV every 6 Continue cardiac monitoring Cardiology consult for in the a.m.  Addendum at 23:37: Patient denies chest pain MG 2.2, K+ 4.0,  Troponin 137>144, likely supply demand mismatch EKG non acute. Cardiology states will see in am

## 2022-07-28 NOTE — TOC Initial Note (Signed)
Transition of Care [facility]) - Initial/Assessment Note    Patient Details  Name: Jodi Colon MRN: [id number] Date of Birth: [birth year]  [facility] CM/SW Contact:    Gerilyn Pilgrim, LCSW Phone Number: 07/28/2022, 9:47 AM  Clinical Narrative:  CSW spoke with patients daughter sonya regarding care at home. Daughter states pt has a service where a MD comes to her house to see her but is unsure the name of this service. Davy Pique states her and her siblings assist their mom at home as she does live by herself but sonya states they all live within a couple of miles of her. Davy Pique is agreeable to [facility] and does not have a preference for patient. Pt is still currently seeing Glory Buff for primary care.  CSW will work on getting [place] arranged for pt.                    Expected Discharge Plan: [place] Barriers to Discharge: Continued Medical Work up   Patient Goals and CMS Choice            Expected Discharge Plan and Services                                              Prior Living Arrangements/Services                       Activities of Daily Living Home Assistive Devices/Equipment: Environmental consultant (specify type) ADL Screening (condition at time of admission) Patient's cognitive ability adequate to safely complete daily activities?: No Is the patient deaf or have difficulty hearing?: No Does the patient have difficulty seeing, even when wearing glasses/contacts?: No Does the patient have difficulty concentrating, remembering, or making decisions?: No Patient able to express need for assistance with ADLs?: Yes Does the patient have difficulty dressing or bathing?: No Independently performs ADLs?: Yes (appropriate for developmental age) Does the patient have difficulty walking or climbing stairs?: No Weakness of Legs: Both Weakness of Arms/Hands: None  Permission Sought/Granted                  Emotional Assessment        Orientation: : Fluctuating Orientation (Suspected and/or reported [place])      Admission diagnosis:  Hyponatremia [E87.1] Generalized weakness [R53.1] Acute renal failure, unspecified acute renal failure type ([place]) [N17.9] Diarrhea, unspecified type [R19.7] Patient Active Problem List   Diagnosis Date Noted   Hyponatremia 07/27/2022   Intractable diarrhea 07/27/2022   AKI (acute kidney injury) ([place]) 07/27/2022   Syncope 01/29/2020   Intracranial vascular stenosis 01/29/2020   Falls frequently 12/17/2019   Seizure-like activity ([place]) 12/17/2019   Weakness 12/17/2019   Macular puckering, right eye 05/18/2019   Mechanical breakdown of intraocular lens 05/18/2019   Primary open angle glaucoma (POAG) of both eyes, moderate stage 05/18/2019   Pseudophakia of both eyes 05/18/2019   Status post total hip replacement, left 11/08/2017   SOBOE (shortness of breath on exertion) [id number]   Umbilical hernia without obstruction and without gangrene 07/28/2016   Restless leg 10/21/2015   Urinary frequency 10/15/2015   Dysuria 10/15/2015   Bursitis, trochanteric 04/02/2015   Trochanteric bursitis of right hip 04/02/2015   Hypothyroidism 03/04/2015   GERD without esophagitis 03/04/2015   Subarachnoid hemorrhage ([place]) 02/09/2015   Degenerative arthritis of lumbar spine  01/29/2015   DDD (degenerative disc disease), lumbar 01/29/2015   Arthritis, degenerative 01/28/2014   Infection with methicillin-resistant Staphylococcus aureus 01/28/2014   Essential hypertension 01/28/2014   Dyslipidemia 01/28/2014   Carcinoma in situ, breast, ductal 01/28/2014   Depression 01/28/2014   Aneurysm, cerebral 01/28/2014   Intraductal carcinoma of breast 01/28/2014   Lumbar canal stenosis 12/07/2013   Neuritis or radiculitis due to rupture of lumbar intervertebral disc 12/07/2013   Cramp in lower leg 06/19/2013   Hemorrhoids that prolapse with straining, but retract spontaneously 01/24/2013   Scalp  lesion 11/27/2012   Rectal prolapse 11/27/2012   Hemorrhoid 08/29/2012   Screening breast examination 07/12/2012   FOM (frequency of micturition) 02/28/2012   Urge incontinence 02/28/2012   History of neoplasm of bladder 02/28/2012   Excessive urination at night 02/28/2012   Bladder CA in situ 02/28/2012   History of methicillin resistant Staphylococcus aureus infection 12/30/2011   H/O infectious disease 12/30/2011   Cutaneous ulcer ([place]) 11/26/2011   Skin ulcer ([place]) 11/26/2011   Colon polyp 11/11/2011   Absolute anemia 11/11/2011   Back pain, chronic 11/01/2011   Abscess, gluteal, right 10/10/2011   Abscess of buttock 10/10/2011   Degenerative joint disease    Cerebrovascular disease    MRSA (methicillin resistant Staphylococcus aureus)    Hyperlipidemia LDL goal < 100 04/02/2011   Bladder cancer ([place]) 10/29/2009   Malignant neoplasm of urinary bladder ([place]) 10/29/2009   PCP:  Jennette Dubin, NP Pharmacy:   [place], [place] [address] Phone: [phone] Fax: [place], [place] [address] Phone: [phone] Fax: [phone]     Social Determinants of Health (SDOH) Social History: SDOH Screenings   Food Insecurity: No Food Insecurity (07/27/2022)  Housing: Low Risk  (07/27/2022)  Transportation Needs: No Transportation Needs (07/27/2022)  Utilities: Not At Risk (07/27/2022)  Tobacco Use: Low Risk  (07/26/2022)   SDOH Interventions:     Readmission Risk Interventions    07/28/2022    9:46 AM  Readmission Risk Prevention Plan  Transportation Screening Complete  PCP or Specialist Appt within 3-5 Days Complete  HRI or Home Care Consult Complete  Medication Review (RN Care Manager) Complete

## 2022-07-28 NOTE — Evaluation (Signed)
Physical Therapy Evaluation Patient Details Name: Jodi Colon MRN: [id number] DOB: 1929-08-28 Today's Date: 07/28/2022  History of Present Illness  Jodi Colon is a 89 y.o. female with medical history significant for essential hypertension, dyslipidemia, hypothyroidism, TIA, CVA s/p PTCA and MCA stent, small brain aneurysm being followed by regular angiograms, spinal stenosis, breast cancer status postlumpectomy and radiotherapy and GERD, who presented to the emergency room with acute onset of significant anorexia with upset stomach and watery diarrhea without vomiting.  Her diarrhea started on Saturday and was last on Sunday then flared again on Monday.  She was more somnolent per her daughter.  She has been feeling significantly weak and she had to be held by her family to avoid falling.  Her symptoms started over the weekend and since then she had dozens of episodes of watery diarrhea.  Her abdominal discomfort has been intermittent cramps that is currently better.   Clinical Impression  Pt admitted with above diagnosis. Pt received upright in recliner agreeable to PT. Reports at baseline she lives alone and is mod-I with rollator in household with daughters living close by that assist with ADL's/IADL's as needed.   To date pt able to stand at [facility] level with 2-3 bouts of momentum but no physical support needed with pt completing ~100' of gait with RW. Pt does demonstrate frequent R veering into wall in room and hallway but this could be baseline as pt has history of CVA/TIA but pt is also used to rollator which has increased mobility in various planes compared to RW. Pt does ambulate at a decreased cadence but reports this is her baseline speed despite the R veering. Pt returned to room sitting in recliner. Educated pt on [facility] versus SNF placement with all needs in reach in recliner. Pt granting permission verbally to contact daughter and discuss D/c recs. After phone call, daughter, Davy Pique is  amenable to home with home services verifying family can check in with pt more frequently.  Pt currently with functional limitations due to the deficits listed below (see PT Problem List). Pt will benefit from skilled PT to increase their independence and safety with mobility to allow discharge to the venue listed below.      Recommendations for follow up therapy are one component of a multi-disciplinary discharge planning process, led by the attending physician.  Recommendations may be updated based on patient status, additional functional criteria and insurance authorization.  Follow Up Recommendations Home health PT      Assistance Recommended at Discharge Frequent or constant Supervision/Assistance  Patient can return home with the following  A little help with walking and/or transfers;Assistance with cooking/housework;Assist for transportation;A little help with bathing/dressing/bathroom;Help with stairs or ramp for entrance    Equipment Recommendations None recommended by PT  Recommendations for Other Services       Functional Status Assessment Patient has had a recent decline in their functional status and demonstrates the ability to make significant improvements in function in a reasonable and predictable amount of time.     Precautions / Restrictions Precautions Precautions: Fall Restrictions Weight Bearing Restrictions: No      Mobility  Bed Mobility               General bed mobility comments: NT. In recliner pre and post testing Patient Response: Cooperative  Transfers Overall transfer level: Needs assistance Equipment used: Rolling walker (2 wheels) Transfers: Sit to/from Stand Sit to Stand: Min guard  General transfer comment: VC's for hand placement. Bouts of momentum to attain standing.    Ambulation/Gait Ambulation/Gait assistance: Min guard Gait Distance (Feet): 100 Feet Assistive device: Rolling walker (2 wheels) Gait  Pattern/deviations: Step-through pattern, Decreased step length - right, Decreased step length - left, Drifts right/left       General Gait Details: Consistent R veering with RW but this may be due to history of pt's CVA and TIA. Generally safe with AD demonstrating adequate balance and LE strength to complete household distances.  Stairs            Wheelchair Mobility    Modified Rankin (Stroke Patients Only)       Balance Overall balance assessment: Needs assistance Sitting-balance support: Feet supported Sitting balance-Leahy Scale: Good     Standing balance support: Bilateral upper extremity supported, During functional activity Standing balance-Leahy Scale: Fair                               Pertinent Vitals/Pain Pain Assessment Pain Assessment: No/denies pain    Home Living Family/patient expects to be discharged to:: Private residence Living Arrangements: Alone Available Help at Discharge: Family;Available PRN/intermittently Type of Home: House Home Access: Level entry       Home Layout: One level Home Equipment: Rollator (4 wheels);BSC/3in1;Grab bars - toilet;Grab bars - tub/shower;Other (comment)      Prior Function Prior Level of Function : Needs assist       Physical Assist : ADLs (physical)   ADLs (physical): IADLs Mobility Comments: Mod I for household mobility using rollator ADLs Comments: Pt reports IND for ADLs, IND light meal prep, family provides transportation, groceries are delivered, has hired cleaners that come each month, medications come pre-packaged     Hand Dominance        Extremity/Trunk Assessment        Lower Extremity Assessment Lower Extremity Assessment: Generalized weakness    Cervical / Trunk Assessment Cervical / Trunk Assessment: Normal  Communication   Communication: No difficulties  Cognition Arousal/Alertness: Awake/alert Behavior During Therapy: WFL for tasks assessed/performed Overall  Cognitive Status: Within Functional Limits for tasks assessed                                          General Comments      Exercises Other Exercises Other Exercises: Role of PT in acute setting, d/c recs HH PT versus SNF.   Assessment/Plan    PT Assessment Patient needs continued PT services  PT Problem List Decreased strength;Decreased knowledge of use of DME       PT Treatment Interventions Therapeutic exercise;DME instruction;Gait training;Balance training;Neuromuscular re-education;Functional mobility training;Therapeutic activities;Patient/family education    PT Goals (Current goals can be found in the Care Plan section)  Acute Rehab PT Goals Patient Stated Goal: return home with family support PT Goal Formulation: With patient/family Time For Goal Achievement: 08/11/22 Potential to Achieve Goals: Good    Frequency Min 2X/week     Co-evaluation               AM-PAC PT "6 Clicks" Mobility  Outcome Measure Help needed turning from your back to your side while in a flat bed without using bedrails?: A Little Help needed moving from lying on your back to sitting on the side of a flat bed without using bedrails?: A  Little Help needed moving to and from a bed to a chair (including a wheelchair)?: A Little Help needed standing up from a chair using your arms (e.g., wheelchair or bedside chair)?: A Little Help needed to walk in hospital room?: A Little Help needed climbing 3-5 steps with a railing? : A Lot 6 Click Score: 17    End of Session Equipment Utilized During Treatment: Gait belt Activity Tolerance: Patient tolerated treatment well Patient left: in chair;with call bell/phone within reach;with chair alarm set Nurse Communication: Mobility status PT Visit Diagnosis: Other abnormalities of gait and mobility (R26.89);Muscle weakness (generalized) (M62.81)    Time: [id number] PT Time Calculation (min) (ACUTE ONLY): 23 min   Charges:   PT  Evaluation $PT Eval Low Complexity: 1 Low PT Treatments $Gait Training: 8-22 mins       Salem Caster. Fairly IV, PT, DPT Physical Therapist- [facility]  07/28/2022, 11:00 AM

## 2022-07-28 NOTE — Progress Notes (Signed)
[facility] Progress Note  Patient: Jodi Colon    [id number]  DOA: 07/27/2022     Date of Service: the patient was seen and examined on 07/28/2022  Chief Complaint  Patient presents with   Diarrhea   Brief hospital course: Jodi Colon is a 89 y.o. female with medical history significant for essential hypertension, dyslipidemia, hypothyroidism, TIA, CVA s/p PTCA and MCA stent, small brain aneurysm being followed by regular angiograms, spinal stenosis, breast cancer status postlumpectomy and radiotherapy and GERD, who presented to the emergency room with acute onset of significant anorexia with upset stomach and watery diarrhea without vomiting.   ED workup: VS sinus tachycardia, hyponatremia sodium 128, creatinine 1.55, hypomagnesemia mag 1.5, hypophosphatemia Phos 2.2, anemia hemoglobin 10.9 GI pathogen and C. difficile stool sample was sent, blood cultures collected Patient was admitted under [facility] service for further management as below.   Assessment and Plan:  # E. coli gastroenteritis and bacteremia Sepsis ruled out, patient was not septic on admission WBC count within normal range Stool negative for C. difficile, GI pathogen positive for E. Coli Blood culture growing E. Coli, sensitivity report is pending started on aztreonam, Patient is allergic to penicillin Continue probiotics ID consulted for further recommendation Repeat blood cultures tomorrow a.m.  # Hyponatremia most likely secondary to dehydration due to diarrhea Sodium 128--132 Continue to monitor daily   # Hypokalemia, potassium repleted. Monitor electrolytes and replete as needed.   # AKI secondary to dehydration Creatinine 1.55--1.32 gradually improving Continue NS 75 mill per hour  # Hypothyroidism, continue Synthroid home dose TSH 1.7 within normal range   # Depression - We will continue her Effexor XR.   # GERD without esophagitis, continue PPI therapy.   # Dyslipidemia, continue  Lipitor 10 mg home dose    # Essential hypertension - We will continue her antihypertensives.   # Vitamin D deficiency: started vitamin D 50,000 units p.o. weekly, follow with PCP to repeat vitamin D level after 3 to 6 months.   Body mass index is 25.97 kg/m.  Interventions:      Diet: Heart healthy diet DVT Prophylaxis: Subcutaneous Heparin    Advance goals of care discussion: DNR  Family Communication: family was not present at bedside, at the time of interview.  The pt provided permission to discuss medical plan with the family. Opportunity was given to ask question and all questions were answered satisfactorily.   Disposition:  Pt is from Home, admitted with diarrhea, E. coli bacteremia, hyponatremia, still has electrolyte imbalance, on IV antibiotics, need to repeat blood cultures, ID consulted, which precludes a safe discharge. Discharge to Home, when clinically improved and cleared by ID.  Subjective: No significant events overnight, patient stated that diarrhea has been resolved, patient is tolerating diet well, no nausea vomiting.  Denies any shortness of breath, no chest pain or palpitation.  Physical Exam: General: NAD, lying comfortably Appear in no distress, affect appropriate Eyes: PERRLA ENT: Oral Mucosa Clear, moist  Neck: no JVD,  Cardiovascular: S1 and S2 Present, no Murmur,  Respiratory: good respiratory effort, Bilateral Air entry equal and Decreased, no Crackles, no wheezes Abdomen: Bowel Sound present, Soft and no tenderness,  Skin: no rashes Extremities: no Pedal edema, no calf tenderness Neurologic: without any new focal findings Gait not checked due to patient safety concerns  Vitals:   07/28/22 0100 07/28/22 0101 07/28/22 0514 07/28/22 0750  BP:   127/62 127/61  Pulse: (!) 109 (!) 109 (!) 109 98  Resp:  18 18  Temp:   98.6 F (37 C) 98.9 F (37.2 C)  TempSrc:   Oral Oral  SpO2: (!) 81% (!) 83% (!) 85% 92%  Weight:      Height:         Intake/Output Summary (Last 24 hours) at 07/28/2022 1214 Last data filed at 07/27/2022 1804 Gross per 24 hour  Intake 1188.83 ml  Output --  Net 1188.83 ml   Filed Weights   07/26/22 2120  Weight: 54.4 kg    Data Reviewed: I have personally reviewed and interpreted daily labs, tele strips, imagings as discussed above. I reviewed all nursing notes, pharmacy notes, vitals, pertinent old records I have discussed plan of care as described above with RN and patient/family.  CBC: Recent Labs  Lab 07/26/22 2126 07/27/22 0432 07/28/22 0501  WBC 16.3* 10.2 7.9  HGB 12.5 10.9* 9.9*  HCT 36.9 32.4* 29.2*  MCV 88.7 87.8 88.5  PLT 137* 104* 123456*   Basic Metabolic Panel: Recent Labs  Lab 07/26/22 2126 07/27/22 0425 07/27/22 0432 07/28/22 0501  NA 126*  --  128* 132*  K 3.8  --  3.5 2.8*  CL 90*  --  98 101  CO2 24  --  21* 23  GLUCOSE 126*  --  113* 119*  BUN 35*  --  33* 30*  CREATININE 1.91*  --  1.55* 1.32*  CALCIUM 8.3*  --  8.0* 7.8*  MG  --  1.5*  --  2.3  PHOS  --  2.2*  --  3.6    Studies: No results found.  Scheduled Meds:  acidophilus  1 capsule Oral Daily   atorvastatin  10 mg Oral BH-q7a   cyanocobalamin  1,000 mcg Oral Daily   heparin injection (subcutaneous)  5,000 Units Subcutaneous Q8H   iron polysaccharides  150 mg Oral BID   [START ON 08/01/2022] levothyroxine  100 mcg Oral Once per day on Sun   levothyroxine  50 mcg Oral Once per day on Mon Tue Wed Thu Fri Sat   multivitamin with minerals  1 tablet Oral Daily   pantoprazole  40 mg Oral BID   potassium chloride  40 mEq Oral Q4H   Vitamin D (Ergocalciferol)  50,000 Units Oral Q7 days   Continuous Infusions:  sodium chloride Stopped (07/27/22 1456)   aztreonam 2 g (07/28/22 0801)   potassium chloride 10 mEq (07/28/22 1204)   PRN Meds: acetaminophen **OR** acetaminophen, alum & mag hydroxide-simeth, artificial tears, fluticasone, hydrOXYzine, loperamide, magnesium hydroxide, ondansetron **OR**  ondansetron (ZOFRAN) IV, senna-docusate, traMADol, traZODone  Time spent: 50 minutes  Author: Val Riles. MD [facility] 07/28/2022 12:14 PM  To reach On-call, see care teams to locate the attending and reach out to them via [email or web address]. If 7PM-7AM, please contact night-coverage If you still have difficulty reaching the attending provider, please page the [facility] (Director on Call) for [facility] on amion for assistance.

## 2022-07-28 NOTE — Progress Notes (Signed)
Order to do bladder scan after patient voids. Patient voided only like a quarter size of fluid. Did bladder scan and literally the bladder scan could not pick up bladder. The patients daughter stated that patient has a hx of bladder cancer and because of treatments the patients bladder is very small. Did in and out cath per order and got 37m. Made MD aware.

## 2022-07-28 NOTE — Progress Notes (Signed)
Pharmacy - Antibiotic Allergy clarification  Patient states had rash to penicillin, feels was in her 20's when had reaction that did not need medical attention.  She feels may have taken amoxicillin in past (without issue)   Doreene Eland, PharmD, BCPS, BCIDP Work Cell: [phone] 07/28/2022 4:31 PM

## 2022-07-28 NOTE — Consult Note (Signed)
NAME: Jodi Colon  DOB: [birth year]  MRN: [id number]  Date/Time: 07/28/2022 10:58 AM  REQUESTING PROVIDER: Dr. Dwyane Dee Subjective:  REASON FOR CONSULT: E.coli bacteremia ? Jodi Colon is a 89 y.o. female with a history of bladder carcinoma, history of CVA, subarachnoid hemorrhage/brain aneurysm, CAD status post PTCA, hypertension, anemia, glaucoma, GERD, spinal stenosis, thyroid disease Presented to the ED on 07/26/2022 with diarrhea that has been ongoing since last Saturday.  She did not have any vomiting.  She was feeling weak and somnolent.  She has had intermittent abdominal discomfort with cramps.  She did not have any fever.  She has had some difficulty passing urine in the past few days. Vitals in the ED  07/27/22  BP 119/54 !  Temp 97.7 F (36.5 C)  Pulse Rate 95  Resp 17  SpO2 95 %    Latest Reference Range & Units 07/27/22  WBC 4.0 - 10.5 K/uL 10.2  Hemoglobin 12.0 - 15.0 g/dL 10.9 (L)  HCT 36.0 - 46.0 % 32.4 (L)  Platelets 150 - 400 K/uL 104 (L)  Creatinine 0.44 - 1.00 mg/dL 1.55 (H)   Past Medical History:  Diagnosis Date   Adnexal cyst    Arthritis    Bladder cancer ([place]) 04/2015   Brain aneurysm    small, followed with regular angiograms, Dr. Estanislado Pandy   Breast cancer [facility]) 2007   DCIS-Right, s/p lumpectomy/xrt   Cerebrovascular disease 2008   s/p PTCA/stent of MCA, [place]   Chronic kidney disease    Complication of anesthesia    2003- trouble waking up after fracture surgery, no issues with anesthesia since   DDD (degenerative disc disease), cervical    Degenerative joint disease    managed by [facility]   GERD (gastroesophageal reflux disease)    Headache    History of hiatal hernia    History of MRSA infection    History of TIA (transient ischemic attack)    Hx MRSA infection    Hyperlipidemia    Hypertension    Hypothyroidism    Incomplete bladder emptying    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)    MRSA  (methicillin resistant Staphylococcus aureus) 2008   perineal abscess   Nocturia    Pneumonia    Radiation 2007   BREAST CA   Spinal stenosis    Stroke [facility])    Thyroid disease    Urge incontinence     Past Surgical History:  Procedure Laterality Date   ABDOMINAL HYSTERECTOMY     AQUEOUS SHUNT Left 01/05/2016   Procedure: AQUEOUS SHUNT;  Surgeon: Ronnell Freshwater, MD;  Location: [place];  Service: Ophthalmology;  Laterality: Left;  AHMED VALVE WITH SCLERAL PATCH GRAFT   AQUEOUS SHUNT Right 07/19/2016   Procedure: AQUEOUS SHUNT;  Surgeon: Ronnell Freshwater, MD;  Location: [place];  Service: Ophthalmology;  Laterality: Right;  Ahmed tube shunt   BACK SURGERY     BLADDER SURGERY  2012, 2014   BRAIN SURGERY Right 2008   stent, [facility], [place]   BREAST LUMPECTOMY Right 2007   DCIS, Lumpectomy F/U radiation    CARPAL TUNNEL RELEASE Left 07/2017   CATARACT EXTRACTION     CHOLECYSTECTOMY     COLONOSCOPY  2013   CYSTOSCOPY W/ RETROGRADES Bilateral 05/20/2015   Procedure: CYSTOSCOPY WITH RETROGRADE PYELOGRAM;  Surgeon: Hollice Espy, MD;  Location: [facility];  Service: Urology;  Laterality: Bilateral;   CYSTOSCOPY W/ RETROGRADES Bilateral 12/24/2015  Procedure: CYSTOSCOPY WITH RETROGRADE PYELOGRAM;  Surgeon: Hollice Espy, MD;  Location: [facility];  Service: Urology;  Laterality: Bilateral;   CYSTOSCOPY WITH BIOPSY N/A 05/20/2015   Procedure: CYSTOSCOPY WITH BIOPSY;  Surgeon: Hollice Espy, MD;  Location: [facility];  Service: Urology;  Laterality: N/A;   CYSTOSCOPY WITH BIOPSY N/A 12/24/2015   Procedure: CYSTOSCOPY WITH BIOPSY;  Surgeon: Hollice Espy, MD;  Location: [facility];  Service: Urology;  Laterality: N/A;   EYE SURGERY Bilateral    Cataract Extraction, glaucoma surgery 2018, 2019   FRACTURE SURGERY Left    Ankle, compound fracture   HEMORRHOID SURGERY  02/01/13   LAPAROSCOPIC HYSTERECTOMY     TONSILLECTOMY     TOTAL HIP ARTHROPLASTY  Left 11/08/2017   Procedure: TOTAL HIP ARTHROPLASTY ANTERIOR APPROACH;  Surgeon: Hessie Knows, MD;  Location: [facility];  Service: Orthopedics;  Laterality: Left;    Social History   Socioeconomic History   Marital status: Widowed    Spouse name: Not on file   Number of children: Not on file   Years of education: Not on file   Highest education level: Not on file  Occupational History   Not on file  Tobacco Use   Smoking status: Never   Smokeless tobacco: Never  Vaping Use   Vaping Use: Never used  Substance and Sexual Activity   Alcohol use: No   Drug use: No   Sexual activity: Not on file  Other Topics Concern   Not on file  Social History Narrative   Not on file   Social Determinants of Health   Financial Resource Strain: Not on file  Food Insecurity: No Food Insecurity (07/27/2022)   Hunger Vital Sign    Worried About Running Out of Food in the Last Year: Never true    Ran Out of Food in the Last Year: Never true  Transportation Needs: No Transportation Needs (07/27/2022)   PRAPARE - Hydrologist (Medical): No    Lack of Transportation (Non-Medical): No  Physical Activity: Not on file  Stress: Not on file  Social Connections: Not on file  Intimate Partner Violence: Not At Risk (07/27/2022)   Humiliation, Afraid, Rape, and Kick questionnaire    Fear of Current or Ex-Partner: No    Emotionally Abused: No    Physically Abused: No    Sexually Abused: No    Family History  Problem Relation Age of Onset   Fibromyalgia Daughter    Breast cancer Neg Hx    Allergies  Allergen Reactions   Penicillins Rash    Has patient had a PCN reaction causing immediate rash, facial/tongue/throat swelling, SOB or lightheadedness with hypotension: Yes Has patient had a PCN reaction causing severe rash involving mucus membranes or skin necrosis: No Has patient had a PCN reaction that required hospitalization: No Has patient had a PCN reaction occurring  within the last 10 years: No If all of the above answers are "NO", then may proceed with Cephalosporin use.  Occurred as an adult & full body rash  Rash Rash Occurred as an adult & full body rash Has patient had a PCN reaction causing immediate rash, facial/tongue/throat swelling, SOB or lightheadedness with hypotension: Yes Has patient had a PCN reaction causing severe rash involving mucus membranes or skin necrosis: No Has patient had a PCN reaction that required hospitalization: No Has patient had a PCN reaction occurring within the last 10 years: No If all of the above answers are "NO", then may  proceed with Cephalosporin use. Has patient had a PCN reaction causing immediate rash, facial/tongue/throat swelling, SOB or lightheadedness with hypotension: Yes Has patient had a PCN reaction causing severe rash involving mucus membranes or skin necrosis: No Has patient had a PCN reaction that required hospitalization: No Has patient had a PCN reaction occurring within the last 10 years: No If all of the above answers are "NO", then may proceed with Cephalosporin use.  Occurred as an adult & full body rash Occurred as an adult & full body rash   Sulfa Antibiotics Swelling and Rash    Facial swelling   Sulfasalazine Swelling and Rash    "swelling of face"   I? Current Facility-Administered Medications  Medication Dose Route Frequency Provider Last Rate Last Admin   0.9 %  sodium chloride infusion   Intravenous Continuous Mansy, Arvella Merles, MD   Stopped at 07/27/22 1456   acetaminophen (TYLENOL) tablet 650 mg  650 mg Oral Q6H PRN Mansy, Jan A, MD   650 mg at 07/27/22 1444   Or   acetaminophen (TYLENOL) suppository 650 mg  650 mg Rectal Q6H PRN Mansy, Jan A, MD       acidophilus (RISAQUAD) capsule 1 capsule  1 capsule Oral Daily Mansy, Jan A, MD   1 capsule at 07/28/22 0802   alum & mag hydroxide-simeth (MAALOX/MYLANTA) 200-200-20 MG/5ML suspension 15 mL  15 mL Oral Q6H PRN Val Riles, MD    15 mL at 07/27/22 2117   artificial tears (LACRILUBE) ophthalmic ointment   Both Eyes Daily PRN Mansy, Jan A, MD       atorvastatin (LIPITOR) tablet 10 mg  10 mg Oral BH-q7a Mansy, Jan A, MD   10 mg at 07/28/22 [id number]   aztreonam (AZACTAM) 2 g in sodium chloride 0.9 % 100 mL IVPB  2 g Intravenous Q12H Berton Mount, RPH 200 mL/hr at 07/28/22 0801 2 g at 07/28/22 0801   cyanocobalamin (VITAMIN B12) tablet 1,000 mcg  1,000 mcg Oral Daily Mansy, Jan A, MD   1,000 mcg at 07/28/22 0802   fluticasone (FLONASE) 50 MCG/ACT nasal spray 1 spray  1 spray Each Nare Daily PRN Mansy, Jan A, MD       heparin injection 5,000 Units  5,000 Units Subcutaneous Q8H Mansy, Jan A, MD   5,000 Units at 07/28/22 [id number]   hydrOXYzine (ATARAX) tablet 25 mg  25 mg Oral TID PRN Val Riles, MD   25 mg at 07/27/22 1444   iron polysaccharides (NIFEREX) capsule 150 mg  150 mg Oral BID Mansy, Jan A, MD   150 mg at 07/28/22 0802   [START ON 08/01/2022] levothyroxine (SYNTHROID) tablet 100 mcg  100 mcg Oral Once per day on Sun Mansy, Jan A, MD       levothyroxine (SYNTHROID) tablet 50 mcg  50 mcg Oral Once per day on Mon Tue Wed Thu Fri Sat Mansy, Jan A, MD   50 mcg at 07/28/22 [id number]   loperamide (IMODIUM) capsule 2 mg  2 mg Oral QID PRN Mansy, Jan A, MD       magnesium hydroxide (MILK OF MAGNESIA) suspension 30 mL  30 mL Oral Daily PRN Mansy, Jan A, MD       multivitamin with minerals tablet 1 tablet  1 tablet Oral Daily Mansy, Jan A, MD   1 tablet at 07/28/22 0802   ondansetron [facility]) tablet 4 mg  4 mg Oral Q6H PRN Mansy, Arvella Merles, MD       Or  ondansetron (ZOFRAN) injection 4 mg  4 mg Intravenous Q6H PRN Mansy, Jan A, MD       pantoprazole (PROTONIX) EC tablet 40 mg  40 mg Oral BID Mansy, Jan A, MD   40 mg at 07/28/22 0802   potassium chloride 10 mEq in 100 mL IVPB  10 mEq Intravenous Q1 Hr x 4 Val Riles, MD 100 mL/hr at 07/28/22 0937 10 mEq at 07/28/22 [id number]   potassium chloride SA (KLOR-CON M) CR tablet 40 mEq  40 mEq Oral  Q4H Val Riles, MD       senna-docusate (Senokot-S) tablet 1 tablet  1 tablet Oral QHS PRN Mansy, Arvella Merles, MD       traMADol Veatrice Bourbon) tablet 50 mg  50 mg Oral Q12H PRN Val Riles, MD   50 mg at 07/28/22 0802   traZODone (DESYREL) tablet 25 mg  25 mg Oral QHS PRN Mansy, Arvella Merles, MD       Vitamin D (Ergocalciferol) (DRISDOL) 1.25 MG (50000 UNIT) capsule 50,000 Units  50,000 Units Oral Q7 days Val Riles, MD   50,000 Units at 07/28/22 [id number]   Facility-Administered Medications Ordered in Other Encounters  Medication Dose Route Frequency Provider Last Rate Last Admin   gemcitabine (GEMZAR) 2,000 mg  2,000 mg Intravenous Once Lloyd Huger, MD       opium-belladonna (B&O SUPPRETTES) 16.2-30 MG suppository 30 mg  30 mg Rectal Once Hollice Espy, MD       opium-belladonna (B&O SUPPRETTES) 16.2-30 MG suppository 30 mg  30 mg Rectal Once Lloyd Huger, MD       opium-belladonna (B&O SUPPRETTES) 16.2-30 MG suppository 30 mg  30 mg Rectal Once Lloyd Huger, MD       White Petrolatum OINT 1 application  1 application  Apply externally Once Lloyd Huger, MD         Abtx:  Anti-infectives (From admission, onward)    Start     Dose/Rate Route Frequency Ordered Stop   07/27/22 1800  aztreonam (AZACTAM) 2 g in sodium chloride 0.9 % 100 mL IVPB        2 g 200 mL/hr over 30 Minutes Intravenous Every 12 hours 07/27/22 1642         REVIEW OF SYSTEMS:  Const: negative fever, negative chills, negative weight loss Eyes: negative diplopia or visual changes, negative eye pain ENT: negative coryza, negative sore throat Resp: negative cough, hemoptysis, dyspnea Cards: negative for chest pain, palpitations, lower extremity edema GU: negative for frequency, dysuria and hematuria GI:  abdominal cramps, diarrhea,   Skin: negative for rash and pruritus Heme: negative for easy bruising and gum/nose bleeding MS: general weakness Pain rt leg Neurolo:negative for headaches,  dizziness, vertigo, memory problems  Psych: negative for feelings of anxiety, depression  Endocrine: negative for thyroid, diabetes Allergy/Immunology- as above PCN rash when she was 89 yrs old  Objective:  VITALS:  BP 127/61 (BP Location: Right Arm)   Pulse 98   Temp 98.9 F (37.2 C) (Oral)   Resp 18   Ht '4\' 9"'$  (1.448 m)   Wt 54.4 kg   SpO2 92%   BMI 25.97 kg/m   PHYSICAL EXAM:  General: Alert, cooperative, no distress, appears stated age.  Head: Normocephalic, without obvious abnormality, atraumatic. Eyes: Conjunctivae clear, anicteric sclerae. Pupils are equal ENT Nares normal. No drainage or sinus tenderness. Lips, mucosa, and tongue normal. No Thrush Neck: Supple, symmetrical, no adenopathy, thyroid: non tender no carotid bruit and no JVD.  Back: No CVA tenderness. Lungs: Clear to auscultation bilaterally. No Wheezing or Rhonchi. No rales. Heart: Regular rate and rhythm, no murmur, rub or gallop. Abdomen: Soft, non-tender,not distended. Bowel sounds normal. No masses Extremities: atraumatic, no cyanosis. No edema. No clubbing Skin: No rashes or lesions. Or bruising Lymph: Cervical, supraclavicular normal. Neurologic: Grossly non-focal Pertinent Labs Lab Results CBC    Component Value Date/Time   WBC 7.9 07/28/2022 0501   RBC 3.30 (L) 07/28/2022 0501   HGB 9.9 (L) 07/28/2022 0501   HGB 13.0 09/19/2013 2126   HCT 29.2 (L) 07/28/2022 0501   HCT 37.3 09/19/2013 2126   PLT 105 (L) 07/28/2022 0501   PLT 245 09/19/2013 2126   MCV 88.5 07/28/2022 0501   MCV 93 09/19/2013 2126   MCH 30.0 07/28/2022 0501   MCHC 33.9 07/28/2022 0501   RDW 13.3 07/28/2022 0501   RDW 15.0 (H) 09/19/2013 2126   LYMPHSABS 1.5 12/06/2019 0818   LYMPHSABS 1.2 03/24/2013 0523   MONOABS 0.5 12/06/2019 0818   MONOABS 0.5 03/24/2013 0523   EOSABS 0.4 12/06/2019 0818   EOSABS 0.4 03/24/2013 0523   BASOSABS 0.0 12/06/2019 0818   BASOSABS 0.0 03/24/2013 0523       Latest Ref Rng & Units  07/28/2022    5:01 AM 07/27/2022    4:32 AM 07/26/2022    9:26 PM  CMP  Glucose 70 - 99 mg/dL 119  113  126   BUN 8 - 23 mg/dL 30  33  35   Creatinine 0.44 - 1.00 mg/dL 1.32  1.55  1.91   Sodium 135 - 145 mmol/L 132  128  126   Potassium 3.5 - 5.1 mmol/L 2.8  3.5  3.8   Chloride 98 - 111 mmol/L 101  98  90   CO2 22 - 32 mmol/L '23  21  24   '$ Calcium 8.9 - 10.3 mg/dL 7.8  8.0  8.3   Total Protein 6.5 - 8.1 g/dL   6.6   Total Bilirubin 0.3 - 1.2 mg/dL   1.0   Alkaline Phos 38 - 126 U/L   105   AST 15 - 41 U/L   40   ALT 0 - 44 U/L   23       Microbiology: Recent Results (from the past 240 hour(s))  Resp panel by RT-PCR (RSV, Flu A&B, Covid) Anterior Nasal Swab     Status: None   Collection Time: 07/26/22  9:26 PM   Specimen: Anterior Nasal Swab  Result Value Ref Range Status   SARS Coronavirus 2 by RT PCR NEGATIVE NEGATIVE Final    Comment: (NOTE) SARS-CoV-2 target nucleic acids are NOT DETECTED.  The SARS-CoV-2 RNA is generally detectable in upper respiratory specimens during the acute phase of infection. The lowest concentration of SARS-CoV-2 viral copies this assay can detect is 138 copies/mL. A negative result does not preclude SARS-Cov-2 infection and should not be used as the sole basis for treatment or other patient management decisions. A negative result may occur with  improper specimen collection/handling, submission of specimen other than nasopharyngeal swab, presence of viral mutation(s) within the areas targeted by this assay, and inadequate number of viral copies(<138 copies/mL). A negative result must be combined with clinical observations, patient history, and epidemiological information. The expected result is Negative.  Fact Sheet for Patients:  EntrepreneurPulse.com.au  Fact Sheet for Healthcare Providers:  IncredibleEmployment.be  This test is no t yet approved or cleared by the [place] and  has been  authorized for detection and/or diagnosis of SARS-CoV-2 by FDA under an Emergency Use Authorization (EUA). This EUA will remain  in effect (meaning this test can be used) for the duration of the COVID-19 declaration under Section 564(b)(1) of the Act, 21 U.S.C.section 360bbb-3(b)(1), unless the authorization is terminated  or revoked sooner.       Influenza A by PCR NEGATIVE NEGATIVE Final   Influenza B by PCR NEGATIVE NEGATIVE Final    Comment: (NOTE) The Xpert Xpress SARS-CoV-2/FLU/RSV plus assay is intended as an aid in the diagnosis of influenza from Nasopharyngeal swab specimens and should not be used as a sole basis for treatment. Nasal washings and aspirates are unacceptable for Xpert Xpress SARS-CoV-2/FLU/RSV testing.  Fact Sheet for Patients: EntrepreneurPulse.com.au  Fact Sheet for Healthcare Providers: IncredibleEmployment.be  This test is not yet approved or cleared by the [place] FDA and has been authorized for detection and/or diagnosis of SARS-CoV-2 by FDA under an Emergency Use Authorization (EUA). This EUA will remain in effect (meaning this test can be used) for the duration of the COVID-19 declaration under Section 564(b)(1) of the Act, 21 U.S.C. section 360bbb-3(b)(1), unless the authorization is terminated or revoked.     Resp Syncytial Virus by PCR NEGATIVE NEGATIVE Final    Comment: (NOTE) Fact Sheet for Patients: EntrepreneurPulse.com.au  Fact Sheet for Healthcare Providers: IncredibleEmployment.be  This test is not yet approved or cleared by the [place] FDA and has been authorized for detection and/or diagnosis of SARS-CoV-2 by FDA under an Emergency Use Authorization (EUA). This EUA will remain in effect (meaning this test can be used) for the duration of the COVID-19 declaration under Section 564(b)(1) of the Act, 21 U.S.C. section 360bbb-3(b)(1), unless the  authorization is terminated or revoked.  Performed at [facility], [address] [place], [place] [address]   Blood culture (single)     Status: Abnormal (Preliminary result)   Collection Time: 07/27/22  4:32 AM   Specimen: BLOOD  Result Value Ref Range Status   Specimen Description   Final    BLOOD RIGHT HAND Performed at [facility], [address], [place], [place] [address]    Special Requests   Final    BOTTLES DRAWN AEROBIC AND ANAEROBIC Blood Culture adequate volume Performed at [facility], [address] [place], [place] [address]    Culture  Setup Time   Final    GRAM NEGATIVE RODS IN BOTH AEROBIC AND ANAEROBIC BOTTLES Organism ID to follow CRITICAL RESULT CALLED TO, READ BACK BY AND VERIFIED WITH: JUSTIN MILLER AT [id number] ON 07/27/22 BY SS Performed at [facility], [address] [place], [place] [address]    Culture (A)  Final    ESCHERICHIA COLI CULTURE REINCUBATED FOR BETTER GROWTH Performed at [facility], [place] [address], [place], [place] [address]    Report Status PENDING  Incomplete  Blood Culture ID Panel (Reflexed)     Status: Abnormal   Collection Time: 07/27/22  4:32 AM  Result Value Ref Range Status   Enterococcus faecalis NOT DETECTED NOT DETECTED Final   Enterococcus Faecium NOT DETECTED NOT DETECTED Final   Listeria monocytogenes NOT DETECTED NOT DETECTED Final   Staphylococcus species NOT DETECTED NOT DETECTED Final   Staphylococcus aureus (BCID) NOT DETECTED NOT DETECTED Final   Staphylococcus epidermidis NOT DETECTED NOT DETECTED Final   Staphylococcus lugdunensis NOT DETECTED NOT DETECTED Final   Streptococcus species NOT DETECTED NOT DETECTED Final  Streptococcus agalactiae NOT DETECTED NOT DETECTED Final   Streptococcus pneumoniae NOT DETECTED NOT DETECTED Final   Streptococcus pyogenes NOT DETECTED NOT DETECTED Final   A.calcoaceticus-baumannii NOT DETECTED NOT DETECTED Final    Bacteroides fragilis NOT DETECTED NOT DETECTED Final   Enterobacterales DETECTED (A) NOT DETECTED Final    Comment: Enterobacterales represent a large order of gram negative bacteria, not a single organism. CRITICAL RESULT CALLED TO, READ BACK BY AND VERIFIED WITH: JUSTIN MILLER AT [id number] ON 07/27/22 BY SS    Enterobacter cloacae complex NOT DETECTED NOT DETECTED Final   Escherichia coli DETECTED (A) NOT DETECTED Final    Comment: CRITICAL RESULT CALLED TO, READ BACK BY AND VERIFIED WITH: JUSTIN MILLER AT [id number] ON 07/27/22 BY SS    Klebsiella aerogenes NOT DETECTED NOT DETECTED Final   Klebsiella oxytoca NOT DETECTED NOT DETECTED Final   Klebsiella pneumoniae NOT DETECTED NOT DETECTED Final   Proteus species NOT DETECTED NOT DETECTED Final   Salmonella species NOT DETECTED NOT DETECTED Final   Serratia marcescens NOT DETECTED NOT DETECTED Final   Haemophilus influenzae NOT DETECTED NOT DETECTED Final   Neisseria meningitidis NOT DETECTED NOT DETECTED Final   Pseudomonas aeruginosa NOT DETECTED NOT DETECTED Final   Stenotrophomonas maltophilia NOT DETECTED NOT DETECTED Final   Candida albicans NOT DETECTED NOT DETECTED Final   Candida auris NOT DETECTED NOT DETECTED Final   Candida glabrata NOT DETECTED NOT DETECTED Final   Candida krusei NOT DETECTED NOT DETECTED Final   Candida parapsilosis NOT DETECTED NOT DETECTED Final   Candida tropicalis NOT DETECTED NOT DETECTED Final   Cryptococcus neoformans/gattii NOT DETECTED NOT DETECTED Final   CTX-M ESBL NOT DETECTED NOT DETECTED Final   Carbapenem resistance IMP NOT DETECTED NOT DETECTED Final   Carbapenem resistance KPC NOT DETECTED NOT DETECTED Final   Carbapenem resistance NDM NOT DETECTED NOT DETECTED Final   Carbapenem resist OXA 48 LIKE NOT DETECTED NOT DETECTED Final   Carbapenem resistance VIM NOT DETECTED NOT DETECTED Final    Comment: Performed at [facility], [place], [place] [address]  C Difficile  Quick Screen w PCR reflex     Status: None   Collection Time: 07/27/22  2:09 PM   Specimen: STOOL  Result Value Ref Range Status   C Diff antigen NEGATIVE NEGATIVE Final   C Diff toxin NEGATIVE NEGATIVE Final   C Diff interpretation No C. difficile detected.  Final    Comment: Performed at [facility], [address] [place], [place] [address]  Gastrointestinal Panel by PCR , Stool     Status: Abnormal   Collection Time: 07/27/22  2:09 PM   Specimen: Stool  Result Value Ref Range Status   Campylobacter species NOT DETECTED NOT DETECTED Final   Plesimonas shigelloides NOT DETECTED NOT DETECTED Final   Salmonella species NOT DETECTED NOT DETECTED Final   Yersinia enterocolitica NOT DETECTED NOT DETECTED Final   Vibrio species NOT DETECTED NOT DETECTED Final   Vibrio cholerae NOT DETECTED NOT DETECTED Final   Enteroaggregative E coli (EAEC) NOT DETECTED NOT DETECTED Final   Enteropathogenic E coli (EPEC) DETECTED (A) NOT DETECTED Final    Comment: RESULT CALLED TO, READ BACK BY AND VERIFIED WITH: GABRIEL BUTTF AT 1955 ON 07/27/22 BY SS    Enterotoxigenic E coli (ETEC) NOT DETECTED NOT DETECTED Final   Shiga like toxin producing E coli (STEC) NOT DETECTED NOT DETECTED Final   Shigella/Enteroinvasive E coli (EIEC) NOT DETECTED NOT DETECTED Final  Cryptosporidium NOT DETECTED NOT DETECTED Final   Cyclospora cayetanensis NOT DETECTED NOT DETECTED Final   Entamoeba histolytica NOT DETECTED NOT DETECTED Final   Giardia lamblia NOT DETECTED NOT DETECTED Final   Adenovirus F40/41 NOT DETECTED NOT DETECTED Final   Astrovirus NOT DETECTED NOT DETECTED Final   Norovirus GI/GII NOT DETECTED NOT DETECTED Final   Rotavirus A NOT DETECTED NOT DETECTED Final   Sapovirus (I, II, IV, and V) NOT DETECTED NOT DETECTED Final    Comment: Performed at [facility], [address] [place], [place] [address]    IMAGING RESULTS: CT abdomen and pelvis B/l non specific perinephric  inflammatory fat stranding. Mild diffuse urinary bladder wall thickening I have personally reviewed the films ? Impression/Recommendation Ecoli bacteremia Could be related to UTI H/o bladder ca Will get post void bladder scan to look for residue  Diarrhea has EPEC  PCN allergy- had rash 60 yrs ago Pt currently on aztreonam- change to ceftriaxone  CAD-   H/o left THA  H/o spinal stenosis? ? Discussed the management with patient, her daughter and care team  Note:  This document was prepared using Dragon voice recognition software and may include unintentional dictation errors.

## 2022-07-29 DIAGNOSIS — R7881 Bacteremia: Secondary | ICD-10-CM

## 2022-07-29 LAB — BASIC METABOLIC PANEL
Anion gap: 7 (ref 5–15)
BUN: 27 mg/dL — ABNORMAL HIGH (ref 8–23)
CO2: 20 mmol/L — ABNORMAL LOW (ref 22–32)
Calcium: 8.1 mg/dL — ABNORMAL LOW (ref 8.9–10.3)
Chloride: 108 mmol/L (ref 98–111)
Creatinine, Ser: 1.08 mg/dL — ABNORMAL HIGH (ref 0.44–1.00)
GFR, Estimated: 49 mL/min — ABNORMAL LOW (ref >60)
Glucose, Bld: 122 mg/dL — ABNORMAL HIGH (ref 70–99)
Potassium: 4.4 mmol/L (ref 3.5–5.1)
Sodium: 135 mmol/L (ref 135–145)

## 2022-07-29 LAB — CBC
HCT: 31 % — ABNORMAL LOW (ref 36.0–46.0)
Hemoglobin: 10.3 g/dL — ABNORMAL LOW (ref 12.0–15.0)
MCH: 29.6 pg (ref 26.0–34.0)
MCHC: 33.2 g/dL (ref 30.0–36.0)
MCV: 89.1 fL (ref 80.0–100.0)
Platelets: 111 10*3/uL — ABNORMAL LOW (ref 150–400)
RBC: 3.48 MIL/uL — ABNORMAL LOW (ref 3.87–5.11)
RDW: 13.9 % (ref 11.5–15.5)
WBC: 8 10*3/uL (ref 4.0–10.5)
nRBC: 0 % (ref 0.0–0.2)

## 2022-07-29 LAB — TSH: TSH: 2.706 u[IU]/mL (ref 0.350–4.500)

## 2022-07-29 LAB — GLUCOSE, CAPILLARY: Glucose-Capillary: 143 mg/dL — ABNORMAL HIGH (ref 70–99)

## 2022-07-29 LAB — MAGNESIUM: Magnesium: 2 mg/dL (ref 1.7–2.4)

## 2022-07-29 LAB — TROPONIN I (HIGH SENSITIVITY): Troponin I (High Sensitivity): 103 ng/L (ref <18)

## 2022-07-29 LAB — PHOSPHORUS: Phosphorus: 2.3 mg/dL — ABNORMAL LOW (ref 2.5–4.6)

## 2022-07-29 MED ORDER — IPRATROPIUM-ALBUTEROL 0.5-2.5 (3) MG/3ML IN SOLN
3.0000 mL | Freq: Once | RESPIRATORY_TRACT | Status: AC
  Administered 2022-07-29: 3 mL via RESPIRATORY_TRACT
  Filled 2022-07-29: qty 3

## 2022-07-29 MED ORDER — MAGNESIUM SULFATE 2 GM/50ML IV SOLN
2.0000 g | Freq: Once | INTRAVENOUS | Status: AC
  Administered 2022-07-29: 2 g via INTRAVENOUS
  Filled 2022-07-29: qty 50

## 2022-07-29 MED ORDER — METOPROLOL TARTRATE 25 MG PO TABS
12.5000 mg | ORAL_TABLET | Freq: Two times a day (BID) | ORAL | Status: DC
  Administered 2022-07-29 (×2): 12.5 mg via ORAL
  Filled 2022-07-29 (×2): qty 1

## 2022-07-29 MED ORDER — IPRATROPIUM-ALBUTEROL 0.5-2.5 (3) MG/3ML IN SOLN: 3.0000 mL | Freq: Four times a day (QID) | RESPIRATORY_TRACT | Status: DC | PRN

## 2022-07-29 MED ORDER — K PHOS MONO-SOD PHOS DI & MONO 155-852-130 MG PO TABS
500.0000 mg | ORAL_TABLET | Freq: Three times a day (TID) | ORAL | Status: AC
  Administered 2022-07-29 (×3): 500 mg via ORAL
  Filled 2022-07-29 (×3): qty 2

## 2022-07-29 NOTE — Consult Note (Addendum)
Jodi Colon NOTE       Patient ID: Jodi Colon MRN: [id number] DOB/AGE: 87-Mar-1931 87 y.o.  Admit date: 07/27/2022 Referring Physician Dr. Judd Gaudier  Primary Physician Dr. Ginette Pitman Primary Cardiologist Dr. Nehemiah Massed Reason for Consultation ?NSVT  HPI: Jodi Colon is a 47yoF with a PMH of hx of TIA, hx R MCA stent, small R ICA aneurysm, CAD s/p angioplasty (2008), HTN, GERD, IBS-D, bladder cancer, breast cancer who presented to [facility] ED 07/27/2022 with abdominal pain, watery diarrhea, and significant anorexia.  GI pathogen panel and blood cultures positive for E. Coli.  Cardiology is consulted on 2/29 for further evaluation of an asymptomatic "20 beat run of nonsustained ventricular tachycardia" on telemetry.  History is obtained primarily from the chart. The patient presented to [facility] ED 07/27/2022 with abdominal pain, watery diarrhea, and poor appetite.  She was tachycardic, hyponatremic with sodium 128 hypomagnesemic at 1.5 with an AKI with creatinine 1.55 initially on admission.  Blood cultures are growing E. coli, stool pathogen was negative for C. difficile but GI path panel is positive for enteropathic E. coli.  She has been treated with broad-spectrum antibiotics, initially aztreonam which has been narrowed to ceftriaxone and continuous IV fluids.  Overnight on 2/28 cross cover was notified of a "20 beat run of nonsustained ventricular tachycardia" on telemetry, the patient was reportedly asymptomatic at that time.  Labs earlier in the day showed a serum potassium low at 2.8, and troponins, and echo, and repeat EKG were ordered.  At my time of evaluation this morning the patient is laying flat in bed, when asked how she feels she says "I am not sure."  She denies chest pain, shortness of breath, abdominal pain, or further episodes of diarrhea today.  No lightheadedness or dizziness, palpitations or heart racing.  Recent vitals are notable for a blood pressure of  155/63, in NSR on telemetry, she is comfortable on room air.  Renal function improving with IV fluids with current BUN/creatinine 27/1.08 and GFR 49.  Potassium and mag are 4.4 and 2.0 respectively on this morning's labs.  BNP is slightly elevated at 866, high-sensitivity troponin checked yesterday evening were elevated but flat trending at 137, 144.  H&H 10/31, platelets 111.  EKG shows sinus tachycardia with rate 100 bpm and LAFB without significant change from priors from 2021 and 2019.  Review of systems complete and found to be negative unless listed above     Past Medical History:  Diagnosis Date   Adnexal cyst    Arthritis    Bladder cancer ([place]) 04/2015   Brain aneurysm    small, followed with regular angiograms, Dr. Estanislado Pandy   Breast cancer [facility]) 2007   DCIS-Right, s/p lumpectomy/xrt   Cerebrovascular disease 2008   s/p PTCA/stent of MCA, [facility]   Chronic kidney disease    Complication of anesthesia    2003- trouble waking up after fracture surgery, no issues with anesthesia since   DDD (degenerative disc disease), cervical    Degenerative joint disease    managed by [facility]   GERD (gastroesophageal reflux disease)    Headache    History of hiatal hernia    History of MRSA infection    History of TIA (transient ischemic attack)    Hx MRSA infection    Hyperlipidemia    Hypertension    Hypothyroidism    Incomplete bladder emptying    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 ([place])    MRSA (  methicillin resistant Staphylococcus aureus) 2008   perineal abscess   Nocturia    Pneumonia    Radiation 2007   BREAST CA   Spinal stenosis    Stroke [facility])    Thyroid disease    Urge incontinence     Past Surgical History:  Procedure Laterality Date   ABDOMINAL HYSTERECTOMY     AQUEOUS SHUNT Left 01/05/2016   Procedure: AQUEOUS SHUNT;  Surgeon: Ronnell Freshwater, MD;  Location: [place];  Service: Ophthalmology;  Laterality: Left;   AHMED VALVE WITH SCLERAL PATCH GRAFT   AQUEOUS SHUNT Right 07/19/2016   Procedure: AQUEOUS SHUNT;  Surgeon: Ronnell Freshwater, MD;  Location: [place];  Service: Ophthalmology;  Laterality: Right;  Ahmed tube shunt   BACK SURGERY     BLADDER SURGERY  2012, 2014   BRAIN SURGERY Right 2008   stent, [facility], [place]   BREAST LUMPECTOMY Right 2007   DCIS, Lumpectomy F/U radiation    CARPAL TUNNEL RELEASE Left 07/2017   CATARACT EXTRACTION     CHOLECYSTECTOMY     COLONOSCOPY  2013   CYSTOSCOPY W/ RETROGRADES Bilateral 05/20/2015   Procedure: CYSTOSCOPY WITH RETROGRADE PYELOGRAM;  Surgeon: Hollice Espy, MD;  Location: [facility];  Service: Urology;  Laterality: Bilateral;   CYSTOSCOPY W/ RETROGRADES Bilateral 12/24/2015   Procedure: CYSTOSCOPY WITH RETROGRADE PYELOGRAM;  Surgeon: Hollice Espy, MD;  Location: [facility];  Service: Urology;  Laterality: Bilateral;   CYSTOSCOPY WITH BIOPSY N/A 05/20/2015   Procedure: CYSTOSCOPY WITH BIOPSY;  Surgeon: Hollice Espy, MD;  Location: [facility];  Service: Urology;  Laterality: N/A;   CYSTOSCOPY WITH BIOPSY N/A 12/24/2015   Procedure: CYSTOSCOPY WITH BIOPSY;  Surgeon: Hollice Espy, MD;  Location: [facility];  Service: Urology;  Laterality: N/A;   EYE SURGERY Bilateral    Cataract Extraction, glaucoma surgery 2018, 2019   FRACTURE SURGERY Left    Ankle, compound fracture   HEMORRHOID SURGERY  02/01/13   LAPAROSCOPIC HYSTERECTOMY     TONSILLECTOMY     TOTAL HIP ARTHROPLASTY Left 11/08/2017   Procedure: TOTAL HIP ARTHROPLASTY ANTERIOR APPROACH;  Surgeon: Hessie Knows, MD;  Location: [facility];  Service: Orthopedics;  Laterality: Left;    Medications Prior to Admission  Medication Sig Dispense Refill Last Dose   acetaminophen (TYLENOL) 325 MG tablet Take 650 mg by mouth 2 (two) times daily.   Past Week   atorvastatin (LIPITOR) 10 MG tablet Take 10 mg by mouth every morning.    Past Week   levothyroxine (SYNTHROID, LEVOTHROID) 50 MCG  tablet Take 50 mcg by mouth daily before breakfast.   Past Week   loratadine (CLARITIN) 10 MG tablet Take 10 mg by mouth daily.   Past Week   pantoprazole (PROTONIX) 40 MG tablet Take 40 mg by mouth 2 (two) times daily.    Past Week   Probiotic Product (PROBIOTIC PO) Take 1 capsule by mouth daily.   Past Week   traMADol (ULTRAM) 50 MG tablet Take 50 mg by mouth 2 (two) times daily.   Past Week   amLODipine (NORVASC) 2.5 MG tablet Take 2.5 mg by mouth daily. (Patient not taking: Reported on 07/27/2022)   Not Taking   aspirin 81 MG chewable tablet Chew 1 tablet (81 mg total) by mouth 2 (two) times daily. (Patient not taking: Reported on 07/27/2022) 30 tablet 0 Not Taking   cetirizine (ZYRTEC) 10 MG tablet Take 10 mg by mouth daily as needed for allergies. (Patient not taking: Reported on 07/27/2022)  Not Taking   cyanocobalamin 1000 MCG tablet Take 1,000 mcg by mouth daily. (Patient not taking: Reported on 07/27/2022)   Not Taking   erythromycin ophthalmic ointment Place 1 application into the right eye at bedtime. (Patient not taking: Reported on 07/27/2022)   Not Taking   fluticasone (FLONASE) 50 MCG/ACT nasal spray Place 1 spray into both nostrils daily as needed for allergies or rhinitis.   prn   Hypromellose (VISTA GEL DRY EYE RELIEF OP) Apply 1-2 drops to eye daily as needed (dry eyes). (Patient not taking: Reported on 07/27/2022)   Not Taking   iron polysaccharides (NIFEREX) 150 MG capsule Take 150 mg by mouth 2 (two) times daily.  (Patient not taking: Reported on 07/27/2022)   Not Taking   loperamide (IMODIUM) 2 MG capsule Take 2 mg by mouth 4 (four) times daily as needed for diarrhea or loose stools.   prn   Multiple Vitamin (MULTIVITAMIN) tablet Take 1 tablet by mouth daily.   (Patient not taking: Reported on 07/27/2022)   Not Taking   prednisoLONE acetate (PRED FORTE) 1 % ophthalmic suspension Place 1 drop into the right eye See admin instructions. Place 1 drop into the right eye 8 times daily.  (Patient not taking: Reported on 07/27/2022)   Not Taking   senna-docusate (SENOKOT-S) 8.6-50 MG tablet Take 1 tablet by mouth at bedtime as needed for mild constipation. (Patient not taking: Reported on 07/27/2022) 15 tablet 0 Not Taking   timolol (TIMOPTIC) 0.5 % ophthalmic solution Place 1 drop into both eyes 2 (two) times daily.  (Patient not taking: Reported on 07/27/2022)   Not Taking   venlafaxine XR (EFFEXOR-XR) 75 MG 24 hr capsule Take 37.5 mg by mouth daily with breakfast.  (Patient not taking: Reported on 07/27/2022)   Not Taking   Social History   Socioeconomic History   Marital status: Widowed    Spouse name: Not on file   Number of children: Not on file   Years of education: Not on file   Highest education level: Not on file  Occupational History   Not on file  Tobacco Use   Smoking status: Never   Smokeless tobacco: Never  Vaping Use   Vaping Use: Never used  Substance and Sexual Activity   Alcohol use: No   Drug use: No   Sexual activity: Not on file  Other Topics Concern   Not on file  Social History Narrative   Not on file   Social Determinants of Health   Financial Resource Strain: Not on file  Food Insecurity: No Food Insecurity (07/27/2022)   Hunger Vital Sign    Worried About Running Out of Food in the Last Year: Never true    Ran Out of Food in the Last Year: Never true  Transportation Needs: No Transportation Needs (07/27/2022)   PRAPARE - Hydrologist (Medical): No    Lack of Transportation (Non-Medical): No  Physical Activity: Not on file  Stress: Not on file  Social Connections: Not on file  Intimate Partner Violence: Not At Risk (07/27/2022)   Humiliation, Afraid, Rape, and Kick questionnaire    Fear of Current or Ex-Partner: No    Emotionally Abused: No    Physically Abused: No    Sexually Abused: No    Family History  Problem Relation Age of Onset   Fibromyalgia Daughter    Breast cancer Neg Hx        Intake/Output Summary (Last 24 hours) at  07/29/2022 0817 Last data filed at 07/29/2022 0527 Gross per 24 hour  Intake 1954.16 ml  Output 100 ml  Net 1854.16 ml    Vitals:   07/28/22 1732 07/28/22 1950 07/29/22 0024 07/29/22 0535  BP: (!) 140/68 (!) 152/67 (!) 163/69 (!) 155/63  Pulse: 89 95 86 94  Resp: '17 18 18 16  '$ Temp: 98.2 F (36.8 C) 98 F (36.7 C) 98.4 F (36.9 C) 98.3 F (36.8 C)  TempSrc: Oral Oral Oral Oral  SpO2: 94% 93% 90% (!) 89%  Weight:      Height:        PHYSICAL EXAM General: Elderly and frail Caucasian female, in no acute distress.  Lying flat in hospital bed, nurse at bedside. HEENT:  Normocephalic and atraumatic. Neck:  No JVD.  Lungs: Normal respiratory effort on room air. Clear bilaterally to auscultation. No wheezes, crackles, rhonchi.  Heart: HRRR . Normal S1 and S2 without gallops or murmurs.  Abdomen: Non-distended appearing.  Msk: Normal strength and tone for age. Extremities: Warm and well perfused. No clubbing, cyanosis.  No peripheral edema.  Neuro: Alert and oriented X 3. Psych:  Answers questions appropriately, but is a limited historian.   Labs: Basic Metabolic Panel: Recent Labs    07/28/22 0501 07/28/22 2043 07/29/22 0521  NA 132* 132* 135  K 2.8* 4.0 4.4  CL 101 103 108  CO2 23 21* 20*  GLUCOSE 119* 142* 122*  BUN 30* 28* 27*  CREATININE 1.32* 1.18* 1.08*  CALCIUM 7.8* 7.8* 8.1*  MG 2.3 2.2 2.0  PHOS 3.6  --  2.3*   Liver Function Tests: Recent Labs    07/26/22 2126  AST 40  ALT 23  ALKPHOS 105  BILITOT 1.0  PROT 6.6  ALBUMIN 3.3*   Recent Labs    07/26/22 2126  LIPASE 29   CBC: Recent Labs    07/28/22 0501 07/29/22 0521  WBC 7.9 8.0  HGB 9.9* 10.3*  HCT 29.2* 31.0*  MCV 88.5 89.1  PLT 105* 111*   Cardiac Enzymes: Recent Labs    07/28/22 2043 07/28/22 2223  TROPONINIHS 137* 144*   BNP: Recent Labs    07/28/22 2043  BNP 866.2*   D-Dimer: No results for input(s): "DDIMER" in the last  72 hours. Hemoglobin A1C: No results for input(s): "HGBA1C" in the last 72 hours. Fasting Lipid Panel: No results for input(s): "CHOL", "HDL", "LDLCALC", "TRIG", "CHOLHDL", "LDLDIRECT" in the last 72 hours. Thyroid Function Tests: Recent Labs    07/26/22 2134  TSH 1.748   Anemia Panel: No results for input(s): "VITAMINB12", "FOLATE", "FERRITIN", "TIBC", "IRON", "RETICCTPCT" in the last 72 hours.   Radiology: CT ABDOMEN PELVIS WO CONTRAST  Result Date: 07/27/2022 CLINICAL DATA:  Diarrhea. EXAM: CT ABDOMEN AND PELVIS WITHOUT CONTRAST TECHNIQUE: Multidetector CT imaging of the abdomen and pelvis was performed following the standard protocol without IV contrast. RADIATION DOSE REDUCTION: This exam was performed according to the departmental dose-optimization program which includes automated exposure control, adjustment of the mA and/or kV according to patient size and/or use of iterative reconstruction technique. COMPARISON:  July 08, 2016 FINDINGS: Lower chest: Mild atelectasis is seen within the posterior aspects of the bilateral lung bases. Hepatobiliary: No focal liver abnormality is seen. Status post cholecystectomy. No biliary dilatation. Pancreas: Unremarkable. No pancreatic ductal dilatation or surrounding inflammatory changes. Spleen: Normal in size without focal abnormality. Adrenals/Urinary Tract: Adrenal glands are unremarkable. Kidneys are normal, without obstructing renal calculi, focal lesion, or hydronephrosis. A 2 mm  nonobstructing renal calculus is seen within the mid left kidney. The urinary bladder is poorly distended and subsequently limited in evaluation. Bilateral nonspecific perinephric inflammatory fat stranding is noted. Mild diffuse urinary bladder wall thickening is seen. Stomach/Bowel: There is a large gastric hernia. The appendix is not clearly identified. No evidence of bowel wall thickening, distention, or inflammatory changes. Noninflamed diverticula are seen  throughout the sigmoid colon. Vascular/Lymphatic: Aortic atherosclerosis. No enlarged abdominal or pelvic lymph nodes. Reproductive: Status post hysterectomy. No adnexal masses. Other: A 3.2 cm x 1.7 cm x 3.1 cm midline, fat containing ventral hernia is seen within the mid to upper anterior abdominal wall. No abdominopelvic ascites. Musculoskeletal: There is a total left hip replacement with associated streak artifact and subsequently limited evaluation of the adjacent osseous and soft tissue structures. There is marked severity scoliosis of the lumbar spine with marked severity multilevel degenerative changes. IMPRESSION: 1. Large gastric hernia. 2. Sigmoid diverticulosis. 3. 2 mm nonobstructing left renal calculus. 4. Marked severity scoliosis of the lumbar spine with marked severity multilevel degenerative changes. 5. Aortic atherosclerosis. Aortic Atherosclerosis (ICD10-I70.0). Electronically Signed   By: Virgina Norfolk M.D.   On: 07/27/2022 00:45    ECHO 10/18/2017 NORMAL LEFT VENTRICULAR SYSTOLIC FUNCTION WITH AN ESTIMATED EF = 55 %  NORMAL RIGHT VENTRICULAR SYSTOLIC FUNCTION  MODERATE TRICUSPID AND MITRAL VALVE INSUFFICIENCY  TRACE AORTIC VALVE INSUFFICIENCY  NO VALVULAR STENOSIS  MILD BIATRIAL ENLARGEMENT   Lexiscan myoview 10/18/2017 Impression  Normal Lexiscan infusion EKG Normal myocardial perfusion without evidence of myocardial ischemia  TELEMETRY reviewed by me (LT) 07/29/2022 : Sinus rhythm rate 70s to 90s, brief run of supraventricular tachycardia around 0500 this AM, no evidence of ventricular tachycardia, PVCs, or high-grade AV block  EKG reviewed by me: sinus tach 100 LAFB without ischemic changes  Data reviewed by me (LT) 07/29/2022: ed note, admission H&P, cross cover note, last 24h vitals tele labs imaging I/O    Principal Problem:   Hyponatremia Active Problems:   Hypothyroidism   Essential hypertension   Dyslipidemia   GERD without esophagitis   Depression    Intractable diarrhea   AKI (acute kidney injury) ([place])    ASSESSMENT AND PLAN:  Jodi Colon is a 82yoF with a PMH of hx of TIA, hx R MCA stent, small R ICA aneurysm, CAD s/p angioplasty (2008), HFpEF (>55%, g1DD, mof MR/TR 09/2017), HTN, GERD, IBS-D, bladder cancer, breast cancer who presented to [facility] ED 07/27/2022 with abdominal pain, watery diarrhea, and significant anorexia.  GI pathogen panel and blood cultures positive for E. Coli.  Cardiology is consulted on 2/29 for further evaluation of an asymptomatic "20 beat run of nonsustained ventricular tachycardia" on telemetry.  # E. coli bacteremia # Enteropathic E. coli gastroenteritis Possible urinary source, ID following.  Currently on ceftriaxone  # Nonsustained supraventricular tachycardia # electrolyte disturbances Brief run on tele of SVT, asymptomatic. Hypokalemic yesterday AM. No evidence of ventricular tachycardia, PVCs or other concerning arrhythmias on tele.  - monitor and replete electrolytes for a K>4 and mag >4  - give 2g IV mag this AM  - start po metoprolol 12.'5mg'$  BID - no further cardiac diagnostics necessary   # Demand ischemia Borderline high-sensitivity troponin elevated at 137, 144, EKG nonacute and patient is chest pain-free.  In the setting this is most consistent with demand/supply mismatch and not ACS   Cardiology will sign off. Please haiku with questions or re-engage if needed.    This patient's plan of care was discussed  and created with Dr. Saralyn Pilar and he is in agreement.  Signed: Tristan Schroeder , PA-C 07/29/2022, 8:17 AM [facility]

## 2022-07-29 NOTE — Progress Notes (Signed)
[facility] Progress Note  Patient: Jodi Colon    [id number]  DOA: 07/27/2022     Date of Service: the patient was seen and examined on 07/29/2022  Chief Complaint  Patient presents with   Diarrhea   Brief hospital course: Jodi Colon is a 89 y.o. female with medical history significant for essential hypertension, dyslipidemia, hypothyroidism, TIA, CVA s/p PTCA and MCA stent, small brain aneurysm being followed by regular angiograms, spinal stenosis, breast cancer status postlumpectomy and radiotherapy and GERD, who presented to the emergency room with acute onset of significant anorexia with upset stomach and watery diarrhea without vomiting.   ED workup: VS sinus tachycardia, hyponatremia sodium 128, creatinine 1.55, hypomagnesemia mag 1.5, hypophosphatemia Phos 2.2, anemia hemoglobin 10.9 GI pathogen and C. difficile stool sample was sent, blood cultures collected Patient was admitted under [facility] service for further management as below.    Assessment and Plan:  # E. coli gastroenteritis and bacteremia Sepsis ruled out, patient was not septic on admission WBC count within normal range Stool negative for C. difficile, GI pathogen positive for E. Coli Blood culture growing E. Coli, sensitivity report is pending S/p aztreonam, d/cd on 2/28 and started ceftriaxone as per ID Continue probiotics ID consult appreciated 2/29 Repeat blood cultures  # Hypophosphatemia due to nutritional deficiency, phosphorus repleted orally.  # Hyponatremia most likely secondary to dehydration due to diarrhea Sodium 128--132--135 Continue to monitor daily  # Hypokalemia, potassium repleted. Monitor electrolytes and replete as needed.  # Paroxysmal SVT developed overnight on 2/28 Cardiology consult appreciated, recommended Lopressor 12.5 mg p.o. twice daily no any other workup. TSH 2.7 within normal range  # AKI secondary to dehydration Creatinine 1.55--1.32--1.08 gradually  improving Continue NS 75 mill per hour  # Hypothyroidism, continue Synthroid home dose TSH 1.7 within normal range   # Depression - We will continue her Effexor XR.   # GERD without esophagitis, continue PPI therapy.   # Dyslipidemia, continue Lipitor 10 mg home dose    # Essential hypertension Discontinued amlodipine due to paroxysmal SVT but on Lopressor 12.5 mg p.o. twice daily Monitor BP and titrate medications accordingly.  # Vitamin D deficiency: started vitamin D 50,000 units p.o. weekly, follow with PCP to repeat vitamin D level after 3 to 6 months.   Body mass index is 25.97 kg/m.  Interventions:      Diet: Heart healthy diet DVT Prophylaxis: Subcutaneous Heparin    Advance goals of care discussion: DNR  Family Communication: family was not present at bedside, at the time of interview.  The pt provided permission to discuss medical plan with the family. Opportunity was given to ask question and all questions were answered satisfactorily.   Disposition:  Pt is from Home, admitted with diarrhea, E. coli bacteremia, hyponatremia, still has electrolyte imbalance, on IV antibiotics, need to repeat blood cultures, ID consulted, which precludes a safe discharge. Discharge to Home HHPT VS NSF TBD, when clinically improve and cleared by ID.   Subjective: Overnight patient had an episode of paroxysmal SVT, denied any chest pain or palpitation in the morning, patient was sitting in the recliner without any acute distress.  Denied any complaints.  Diarrhea resolved.   Physical Exam: General: NAD, lying comfortably Appear in no distress, affect appropriate Eyes: PERRLA ENT: Oral Mucosa Clear, moist  Neck: no JVD,  Cardiovascular: S1 and S2 Present, no Murmur,  Respiratory: good respiratory effort, Bilateral Air entry equal and Decreased, no Crackles, no wheezes Abdomen: Bowel Sound  present, Soft and no tenderness,  Skin: no rashes Extremities: no Pedal edema, no calf  tenderness Neurologic: without any new focal findings Gait not checked due to patient safety concerns  Vitals:   07/29/22 0024 07/29/22 0535 07/29/22 0830 07/29/22 1149  BP: (!) 163/69 (!) 155/63 (!) 176/74 (!) 145/72  Pulse: 86 94 90 82  Resp: '18 16 16 '$ (!) 24  Temp: 98.4 F (36.9 C) 98.3 F (36.8 C) 98.4 F (36.9 C) 98.2 F (36.8 C)  TempSrc: Oral Oral  Oral  SpO2: 90% (!) 89% 91% 95%  Weight:      Height:        Intake/Output Summary (Last 24 hours) at 07/29/2022 1457 Last data filed at 07/29/2022 [id number] Gross per 24 hour  Intake 1954.16 ml  Output 1050 ml  Net 904.16 ml   Filed Weights   07/26/22 2120  Weight: 54.4 kg    Data Reviewed: I have personally reviewed and interpreted daily labs, tele strips, imagings as discussed above. I reviewed all nursing notes, pharmacy notes, vitals, pertinent old records I have discussed plan of care as described above with RN and patient/family.  CBC: Recent Labs  Lab 07/26/22 2126 07/27/22 0432 07/28/22 0501 07/29/22 0521  WBC 16.3* 10.2 7.9 8.0  HGB 12.5 10.9* 9.9* 10.3*  HCT 36.9 32.4* 29.2* 31.0*  MCV 88.7 87.8 88.5 89.1  PLT 137* 104* 105* [id number]*   Basic Metabolic Panel: Recent Labs  Lab 07/26/22 2126 07/27/22 0425 07/27/22 0432 07/28/22 0501 07/28/22 2043 07/29/22 0521  NA 126*  --  128* 132* 132* 135  K 3.8  --  3.5 2.8* 4.0 4.4  CL 90*  --  98 101 103 108  CO2 24  --  21* 23 21* 20*  GLUCOSE 126*  --  113* 119* 142* 122*  BUN 35*  --  33* 30* 28* 27*  CREATININE 1.91*  --  1.55* 1.32* 1.18* 1.08*  CALCIUM 8.3*  --  8.0* 7.8* 7.8* 8.1*  MG  --  1.5*  --  2.3 2.2 2.0  PHOS  --  2.2*  --  3.6  --  2.3*    Studies: No results found.  Scheduled Meds:  acidophilus  1 capsule Oral Daily   atorvastatin  10 mg Oral [id number]   cyanocobalamin  1,000 mcg Oral Daily   heparin injection (subcutaneous)  5,000 Units Subcutaneous Q8H   iron polysaccharides  150 mg Oral BID   [START ON 08/01/2022] levothyroxine  100 mcg  Oral Once per day on Sun   levothyroxine  50 mcg Oral Once per day on Mon Tue Wed Thu Fri Sat   melatonin  5 mg Oral QHS   metoprolol tartrate  12.5 mg Oral BID   multivitamin with minerals  1 tablet Oral Daily   pantoprazole  40 mg Oral BID   phosphorus  500 mg Oral TID   rOPINIRole  0.25 mg Oral BID   Vitamin D (Ergocalciferol)  50,000 Units Oral Q7 days   Continuous Infusions:  sodium chloride 75 mL/hr at 07/29/22 0602   cefTRIAXone (ROCEPHIN)  IV Stopped (07/28/22 1826)   PRN Meds: acetaminophen **OR** acetaminophen, alum & mag hydroxide-simeth, artificial tears, fluticasone, hydrOXYzine, ipratropium-albuterol, loperamide, magnesium hydroxide, ondansetron **OR** ondansetron (ZOFRAN) IV, senna-docusate, traMADol  Time spent: 50 minutes  Author: Val Riles. MD [facility] 07/29/2022 2:57 PM  To reach On-call, see care teams to locate the attending and reach out to them via [email or web address]. If 7PM-7AM, please contact night-coverage  If you still have difficulty reaching the attending provider, please page the [facility] (Director on Call) for [facility] on amion for assistance.

## 2022-07-29 NOTE — Progress Notes (Signed)
   Date of Admission:  07/27/2022     ID: Jodi Colon is a 89 y.o. female Principal Problem:   Hyponatremia Active Problems:   Hypothyroidism   Essential hypertension   Dyslipidemia   GERD without esophagitis   Depression   Intractable diarrhea   AKI (acute kidney injury) ([place])    Subjective: Pt is sleeping Spoke to his daughter who said she has been sleeping a lot and her medications are being tweaked  Medications:   acidophilus  1 capsule Oral Daily   atorvastatin  10 mg Oral BH-q7a   cyanocobalamin  1,000 mcg Oral Daily   heparin injection (subcutaneous)  5,000 Units Subcutaneous Q8H   iron polysaccharides  150 mg Oral BID   [START ON 08/01/2022] levothyroxine  100 mcg Oral Once per day on Sun   levothyroxine  50 mcg Oral Once per day on Mon Tue Wed Thu Fri Sat   melatonin  5 mg Oral QHS   metoprolol tartrate  12.5 mg Oral BID   multivitamin with minerals  1 tablet Oral Daily   pantoprazole  40 mg Oral BID   phosphorus  500 mg Oral TID   rOPINIRole  0.25 mg Oral BID   Vitamin D (Ergocalciferol)  50,000 Units Oral Q7 days    Objective: Vital signs in last 24 hours: Temp:  [98 F (36.7 C)-98.4 F (36.9 C)] 98.2 F (36.8 C) (02/29 1149) Pulse Rate:  [82-95] 82 (02/29 1149) Resp:  [16-24] 24 (02/29 1149) BP: (140-176)/(63-74) 145/72 (02/29 1149) SpO2:  [89 %-95 %] 95 % (02/29 1149)    PHYSICAL EXAM:  General: sleeping Lungs: Clear to auscultation bilaterally. No Wheezing or Rhonchi. No rales. Heart: Regular rate and rhythm, no murmur, rub or gallop. Abdomen: Soft, non-tender,not distended. Bowel sounds normal. No masses Extremities: atraumatic, no cyanosis. No edema. No clubbing Skin: No rashes or lesions. Or bruising Lymph: Cervical, supraclavicular normal. Neurologic: Grossly non-focal  Lab Results Recent Labs    07/28/22 0501 07/28/22 2043 07/29/22 0521  WBC 7.9  --  8.0  HGB 9.9*  --  10.3*  HCT 29.2*  --  31.0*  NA 132* 132* 135  K 2.8* 4.0 4.4   CL 101 103 108  CO2 23 21* 20*  BUN 30* 28* 27*  CREATININE 1.32* 1.18* 1.08*   Liver Panel Recent Labs    07/26/22 2126  PROT 6.6  ALBUMIN 3.3*  AST 40  ALT 23  ALKPHOS 105  BILITOT 1.0    Assessment/Plan: Ecoli bacteremia Could be related to UTI H/o bladder ca post void bladder scan no residue On ceftriaxone   Diarrhea has EPEC   PCN allergy- had rash 60 yrs ago Tolerating ceftriaxone   CAD-    H/o left THA   H/o spinal stenosis?-on tramadol- that is making her sleepy ? Discussed the management with daughter

## 2022-07-29 NOTE — Progress Notes (Signed)
Occupational Therapy Treatment Patient Details Name: Jodi Colon MRN: [id number] DOB: 26-Dec-1929 Today's Date: 07/29/2022   History of present illness Jodi Colon is a 89 y.o. female with medical history significant for essential hypertension, dyslipidemia, hypothyroidism, TIA, CVA s/p PTCA and MCA stent, small brain aneurysm being followed by regular angiograms, spinal stenosis, breast cancer status postlumpectomy and radiotherapy and GERD, who presented to the emergency room with acute onset of significant anorexia with upset stomach and watery diarrhea without vomiting.  Her diarrhea started on Saturday and was last on Sunday then flared again on Monday.  She was more somnolent per her daughter.  She has been feeling significantly weak and she had to be held by her family to avoid falling.  Her symptoms started over the weekend and since then she had dozens of episodes of watery diarrhea.  Her abdominal discomfort has been intermittent cramps that is currently better.   OT comments  Pt received seated on BSC, NT in room. Appearing alert; willing to work with OT on ADLs. See flowsheet below for further details of session. Pt continuing to have deficits in standing balance/endurance, LB dressing, bathing, grooming, hygiene. Left seated in chair with all needs in reach. Discussed with patient and she is in agreement with rehab prior to returning home since she does not have assistance at home at all times.    Recommendations for follow up therapy are one component of a multi-disciplinary discharge planning process, led by the attending physician.  Recommendations may be updated based on patient status, additional functional criteria and insurance authorization.    Follow Up Recommendations  Skilled nursing-short term rehab (<3 hours/day)     Assistance Recommended at Discharge Frequent or constant Supervision/Assistance  Patient can return home with the following  A little help with walking  and/or transfers;Assistance with cooking/housework;Assist for transportation;Help with stairs or ramp for entrance;A lot of help with bathing/dressing/bathroom   Equipment Recommendations  Other (comment) (defer to next venue of care)    Recommendations for Other Services      Precautions / Restrictions Precautions Precautions: Fall Restrictions Weight Bearing Restrictions: No       Mobility Bed Mobility               General bed mobility comments: Bed mobility not tested today; pt on BSC when OT arrived and seated in recliner upon OT d/c.    Transfers Overall transfer level: Needs assistance Equipment used: Rolling walker (2 wheels) Transfers: Sit to/from Stand Sit to Stand: Min guard, Min assist                 Balance Overall balance assessment: Needs assistance Sitting-balance support: Feet unsupported Sitting balance-Leahy Scale: Good     Standing balance support: Bilateral upper extremity supported, During functional activity Standing balance-Leahy Scale: Fair Standing balance comment: At times able to stand with BIL hands off RW (such as washing hands), with CGA.                           ADL either performed or assessed with clinical judgement   ADL Overall ADL's : Needs assistance/impaired Eating/Feeding: Set up;Sitting Eating/Feeding Details (indicate cue type and reason): OT assisted with opening packaging Grooming: Wash/dry hands;Wash/dry face;Oral care;Brushing hair;Standing;Min guard Grooming Details (indicate cue type and reason): with seated rest breaks 2/2 decreased activity tolerance Upper Body Bathing: Set up;Sitting Upper Body Bathing Details (indicate cue type and reason): assist for back; seated on  BSC for UB sponge bathing in front of sink Lower Body Bathing: Minimal assistance;Sitting/lateral leans Lower Body Bathing Details (indicate cue type and reason): assist for below claves; would benefit from long-handled  sponge Upper Body Dressing : Sitting;Minimal assistance Upper Body Dressing Details (indicate cue type and reason): assist threading into RUE with new gown Lower Body Dressing: Moderate assistance;Maximal assistance;Sit to/from stand Lower Body Dressing Details (indicate cue type and reason): seated on BSC, pt required assistance for threading underwear; discussed use of reacher; will need training (and does not have reacher at home); Pt was able to pull up underwear once in standing position. depenent to don BIL socks Toilet Transfer: Minimal assistance;Min Dispensing optician Details (indicate cue type and reason): Pt able to t/f sit to stand with CGA, with one instance of MIN A once fatigued. Completed total of 5 sit to stands. Required cues for hand placement, then good carryover. Toileting- Clothing Manipulation and Hygiene: Minimal assistance;Sit to/from stand Toileting - Clothing Manipulation Details (indicate cue type and reason): Pt performing bowel hygiene in standing with wet wipe; required set up from OT and one wipe by OT after pt did hygiene for thoroughness, as pt not quite able to get totally clean.   Tub/Shower Transfer Details (indicate cue type and reason): Would require walk-in shower with shower chair and handheld showerhead for safety Functional mobility during ADLs: Min guard;Cueing for sequencing;Rolling walker (2 wheels) General ADL Comments: Pt requiring cues for use of RW (steering R, as prior CVA). Discussion with pt about her ADL status; she is in agreement that since she does not have 24/7 assist at home, she will require SNF rehab at d/c given that she is unable to independently perform her ADLs and does not have the activity tolerance to manage how many times she is needing to go to the bathroom each day.    Extremity/Trunk Assessment Upper Extremity Assessment Upper Extremity Assessment: Generalized weakness   Lower Extremity Assessment Lower Extremity  Assessment: Generalized weakness        Vision   Additional Comments: Pt running into items on the R and having difficulty steering RW in straight direction (hx of CVA).   Perception     Praxis      Cognition Arousal/Alertness: Awake/alert Behavior During Therapy: WFL for tasks assessed/performed Overall Cognitive Status: Within Functional Limits for tasks assessed                                 General Comments: Follows commands well. Able to have discussion about rehab and is in agreement with SNF at d/c.        Exercises      Shoulder Instructions       General Comments      Pertinent Vitals/ Pain       Pain Assessment Pain Assessment: No/denies pain  Home Living                                          Prior Functioning/Environment              Frequency  Min 2X/week        Progress Toward Goals  OT Goals(current goals can now be found in the care plan section)  Progress towards OT goals: Progressing toward goals  Acute Rehab OT Goals Patient Stated Goal:  Get better; go to rehab before going home OT Goal Formulation: With patient Time For Goal Achievement: 08/10/22 Potential to Achieve Goals: Good ADL Goals Pt Will Perform Grooming: Independently;standing Pt Will Perform Lower Body Dressing: sit to/from stand;Independently Pt Will Transfer to Toilet: with modified independence;ambulating Pt Will Perform Toileting - Clothing Manipulation and hygiene: Independently;sit to/from stand  Plan Discharge plan remains appropriate    Co-evaluation                 AM-PAC OT "6 Clicks" Daily Activity     Outcome Measure   Help from another person eating meals?: None Help from another person taking care of personal grooming?: A Little Help from another person toileting, which includes using toliet, bedpan, or urinal?: A Little Help from another person bathing (including washing, rinsing, drying)?: A  Little Help from another person to put on and taking off regular upper body clothing?: A Little Help from another person to put on and taking off regular lower body clothing?: A Lot 6 Click Score: 18    End of Session Equipment Utilized During Treatment: Rolling walker (2 wheels)  OT Visit Diagnosis: Unsteadiness on feet (R26.81);Muscle weakness (generalized) (M62.81);Pain   Activity Tolerance Patient tolerated treatment well;Patient limited by fatigue   Patient Left in chair;with call bell/phone within reach;with chair alarm set   Nurse Communication Mobility status        Time: [id number] OT Time Calculation (min): 39 min  Charges: OT General Charges $OT Visit: 1 Visit OT Treatments $Self Care/Home Management : 38-52 mins  Waymon Amato, MS, OTR/L  Vania Rea 07/29/2022, 10:47 AM

## 2022-07-30 DIAGNOSIS — R197 Diarrhea, unspecified: Secondary | ICD-10-CM

## 2022-07-30 DIAGNOSIS — N390 Urinary tract infection, site not specified: Secondary | ICD-10-CM

## 2022-07-30 LAB — CULTURE, BLOOD (SINGLE): Special Requests: ADEQUATE

## 2022-07-30 LAB — BASIC METABOLIC PANEL
Anion gap: 9 (ref 5–15)
BUN: 17 mg/dL (ref 8–23)
CO2: 23 mmol/L (ref 22–32)
Calcium: 7.7 mg/dL — ABNORMAL LOW (ref 8.9–10.3)
Chloride: 106 mmol/L (ref 98–111)
Creatinine, Ser: 0.86 mg/dL (ref 0.44–1.00)
GFR, Estimated: >60 mL/min (ref >60)
Glucose, Bld: 103 mg/dL — ABNORMAL HIGH (ref 70–99)
Potassium: 2.9 mmol/L — ABNORMAL LOW (ref 3.5–5.1)
Sodium: 138 mmol/L (ref 135–145)

## 2022-07-30 LAB — PHOSPHORUS: Phosphorus: 3.6 mg/dL (ref 2.5–4.6)

## 2022-07-30 LAB — CBC
HCT: 30.6 % — ABNORMAL LOW (ref 36.0–46.0)
Hemoglobin: 10.3 g/dL — ABNORMAL LOW (ref 12.0–15.0)
MCH: 29.9 pg (ref 26.0–34.0)
MCHC: 33.7 g/dL (ref 30.0–36.0)
MCV: 88.7 fL (ref 80.0–100.0)
Platelets: 141 10*3/uL — ABNORMAL LOW (ref 150–400)
RBC: 3.45 MIL/uL — ABNORMAL LOW (ref 3.87–5.11)
RDW: 14 % (ref 11.5–15.5)
WBC: 7.7 10*3/uL (ref 4.0–10.5)
nRBC: 0 % (ref 0.0–0.2)

## 2022-07-30 LAB — MAGNESIUM: Magnesium: 1.9 mg/dL (ref 1.7–2.4)

## 2022-07-30 MED ORDER — POTASSIUM CHLORIDE CRYS ER 20 MEQ PO TBCR
40.0000 meq | EXTENDED_RELEASE_TABLET | ORAL | Status: AC
  Administered 2022-07-30 (×2): 40 meq via ORAL
  Filled 2022-07-30 (×2): qty 2

## 2022-07-30 MED ORDER — AMLODIPINE BESYLATE 5 MG PO TABS
5.0000 mg | ORAL_TABLET | Freq: Every day | ORAL | Status: DC
  Administered 2022-07-30 – 2022-07-31 (×2): 5 mg via ORAL
  Filled 2022-07-30 (×2): qty 1

## 2022-07-30 MED ORDER — POTASSIUM CHLORIDE CRYS ER 20 MEQ PO TBCR: 40.0000 meq | EXTENDED_RELEASE_TABLET | ORAL | Status: DC

## 2022-07-30 MED ORDER — LEVOTHYROXINE SODIUM 50 MCG PO TABS
50.0000 ug | ORAL_TABLET | Freq: Every day | ORAL | Status: DC
  Administered 2022-07-31: 50 ug via ORAL
  Filled 2022-07-30: qty 1

## 2022-07-30 MED ORDER — HYDRALAZINE HCL 20 MG/ML IJ SOLN
10.0000 mg | Freq: Four times a day (QID) | INTRAMUSCULAR | Status: DC | PRN
  Administered 2022-07-30: 10 mg via INTRAVENOUS
  Filled 2022-07-30: qty 1

## 2022-07-30 MED ORDER — HYDRALAZINE HCL 50 MG PO TABS: 50.0000 mg | ORAL_TABLET | Freq: Four times a day (QID) | ORAL | Status: DC | PRN

## 2022-07-30 MED ORDER — POTASSIUM CHLORIDE 10 MEQ/100ML IV SOLN
10.0000 meq | INTRAVENOUS | Status: AC
  Administered 2022-07-30 (×3): 10 meq via INTRAVENOUS
  Filled 2022-07-30 (×4): qty 100

## 2022-07-30 MED ORDER — METOPROLOL TARTRATE 25 MG PO TABS
25.0000 mg | ORAL_TABLET | Freq: Two times a day (BID) | ORAL | Status: DC
  Administered 2022-07-30 – 2022-07-31 (×3): 25 mg via ORAL
  Filled 2022-07-30 (×3): qty 1

## 2022-07-30 NOTE — Progress Notes (Signed)
Mobility Specialist - Progress Note     07/30/22 1526  Mobility  Activity Transferred to/from [facility];Transferred from chair to bed;Ambulated with assistance in room  Level of Assistance Contact guard assist, steadying assist  Assistive Device Front wheel walker  Distance Ambulated (ft) 6 ft  Activity Response Tolerated well  Mobility Referral Yes  $Mobility charge 1 Mobility   Pt OOB on BSC upon arrival. Pt STS and ambulates to bed CGA with AD. Pt left in bed with needs in reach and bed alarm activated.   Loma Sender Mobility Specialist 07/30/22, 3:36 PM

## 2022-07-30 NOTE — Progress Notes (Signed)
Date of Admission:  07/27/2022     ID: Jodi Colon is a 89 y.o. female Principal Problem:   Hyponatremia Active Problems:   Hypothyroidism   Essential hypertension   Dyslipidemia   GERD without esophagitis   Depression   Intractable diarrhea   Acute renal failure (HCC)   Bacteremia due to Escherichia coli    Subjective: Doing much better Awake and alert Diarrhea almost resolved No pain on urination  Medications:   acidophilus  1 capsule Oral Daily   atorvastatin  10 mg Oral BH-q7a   cyanocobalamin  1,000 mcg Oral Daily   heparin injection (subcutaneous)  5,000 Units Subcutaneous Q8H   iron polysaccharides  150 mg Oral BID   [START ON 08/01/2022] levothyroxine  100 mcg Oral Once per day on Sun   levothyroxine  50 mcg Oral Once per day on Mon Tue Wed Thu Fri Sat   melatonin  5 mg Oral QHS   metoprolol tartrate  25 mg Oral BID   multivitamin with minerals  1 tablet Oral Daily   pantoprazole  40 mg Oral BID   potassium chloride  40 mEq Oral Q4H   rOPINIRole  0.25 mg Oral BID   Vitamin D (Ergocalciferol)  50,000 Units Oral Q7 days    Objective: Patient Vitals for the past 24 hrs:  BP Temp Temp src Pulse Resp SpO2  07/30/22 0830 (!) 182/68 99.7 F (37.6 C) -- 87 20 95 %  07/30/22 0640 (!) 167/128 98.8 F (37.1 C) Oral 87 19 94 %  07/29/22 1149 (!) 145/72 98.2 F (36.8 C) Oral 82 (!) 24 95 %      PHYSICAL EXAM:  General: Alert, cooperative, no distress, appears stated age.  Head: Normocephalic, without obvious abnormality, atraumatic. Eyes: Conjunctivae clear, anicteric sclerae. Pupils are equal ENT Nares normal. No drainage or sinus tenderness. Lips, mucosa, and tongue normal. No Thrush Neck: Supple, symmetrical, no adenopathy, thyroid: non tender no carotid bruit and no JVD. Back: No CVA tenderness. Lungs: Clear to auscultation bilaterally. No Wheezing or Rhonchi. No rales. Heart: Regular rate and rhythm, no murmur, rub or gallop. Abdomen: Soft,  non-tender,not distended. Bowel sounds normal. No masses Extremities: atraumatic, no cyanosis. No edema. No clubbing Skin: No rashes or lesions. Or bruising Lymph: Cervical, supraclavicular normal. Neurologic: Grossly non-focal  Lab Results    Latest Ref Rng & Units 07/30/2022    8:41 AM 07/29/2022    5:21 AM 07/28/2022    5:01 AM  CBC  WBC 4.0 - 10.5 K/uL 7.7  8.0  7.9   Hemoglobin 12.0 - 15.0 g/dL 10.3  10.3  9.9   Hematocrit 36.0 - 46.0 % 30.6  31.0  29.2   Platelets 150 - 400 K/uL 141  111  105        Latest Ref Rng & Units 07/30/2022    8:41 AM 07/29/2022    5:21 AM 07/28/2022    8:43 PM  CMP  Glucose 70 - 99 mg/dL 103  122  142   BUN 8 - 23 mg/dL '17  27  28   '$ Creatinine 0.44 - 1.00 mg/dL 0.86  1.08  1.18   Sodium 135 - 145 mmol/L 138  135  132   Potassium 3.5 - 5.1 mmol/L 2.9  4.4  4.0   Chloride 98 - 111 mmol/L 106  108  103   CO2 22 - 32 mmol/L '23  20  21   '$ Calcium 8.9 - 10.3 mg/dL 7.7  8.1  7.8      Microbiology: 07/27/22 [facility] ecoli 2/29 bc  2/28 uc Studies/Results: No results found.   Assessment/Plan: Ecoli bacteremia Ecoli UTI H/o bladder ca post void bladder scan no residue On ceftriaxone Has PO quinolone  ( adjusted for crcl) option on discharge Need total of 1-14 days  of antibiotic-- 08/08/22    Diarrhea has EPEC  AKI-   Thrombocytopenia- improved anemia   PCN allergy- had rash 60 yrs ago Tolerating ceftriaxone   CAD-    H/o left THA   H/o spinal stenosis? ? Discussed the management with patient ID will follow her peripherally this weekend- call if needed

## 2022-07-30 NOTE — Progress Notes (Signed)
Notified Dr Dwyane Dee of patient's BP 190/70, RR 24 Rest of VSS. Patient denies pain, or any distress. MD placed orders for antihypertensive medications.

## 2022-07-30 NOTE — TOC Progression Note (Signed)
Transition of Care [facility]) - Progression Note    Patient Details  Name: NIOMA PRUSHA MRN: [id number] Date of Birth: 14-Oct-1929  Transition of Care [facility]) CM/SW Contact  Gerilyn Pilgrim, LCSW Phone Number: 07/30/2022, 10:41 AM  Clinical Narrative:  CSW spoke with pt about recommendation changing to SNF. Pt agreeable to this and would like referrals sent out in [place] area. Referrals sent.      Expected Discharge Plan: [place] Barriers to Discharge: Continued Medical Work up  Expected Discharge Plan and Services                                               Social Determinants of Health (SDOH) Interventions SDOH Screenings   Food Insecurity: No Food Insecurity (07/27/2022)  Housing: Low Risk  (07/27/2022)  Transportation Needs: No Transportation Needs (07/27/2022)  Utilities: Not At Risk (07/27/2022)  Tobacco Use: Low Risk  (07/26/2022)    Readmission Risk Interventions    07/28/2022    9:46 AM  Readmission Risk Prevention Plan  Transportation Screening Complete  PCP or Specialist Appt within 3-5 Days Complete  HRI or Home Care Consult Complete  Medication Review (RN Care Manager) Complete

## 2022-07-30 NOTE — TOC Progression Note (Signed)
Transition of Care [facility]) - Progression Note    Patient Details  Name: Jodi Colon MRN: [id number] Date of Birth: Mar 23, 1930  Transition of Care [facility]) CM/SW Contact  Gerilyn Pilgrim, LCSW Phone Number: 07/30/2022, 3:25 PM  Clinical Narrative:  Josem Kaufmann started for [facility].        Expected Discharge Plan: [place] Barriers to Discharge: Continued Medical Work up  Expected Discharge Plan and Services                                               Social Determinants of Health (SDOH) Interventions SDOH Screenings   Food Insecurity: No Food Insecurity (07/27/2022)  Housing: Low Risk  (07/27/2022)  Transportation Needs: No Transportation Needs (07/27/2022)  Utilities: Not At Risk (07/27/2022)  Tobacco Use: Low Risk  (07/26/2022)    Readmission Risk Interventions    07/28/2022    9:46 AM  Readmission Risk Prevention Plan  Transportation Screening Complete  PCP or Specialist Appt within 3-5 Days Complete  HRI or Home Care Consult Complete  Medication Review (RN Care Manager) Complete

## 2022-07-30 NOTE — Progress Notes (Signed)
Physical Therapy Treatment Patient Details Name: Jodi Colon MRN: [id number] DOB: [birth year] Today's Date: 07/30/2022   History of Present Illness Jodi Colon is a 89 y.o. female with medical history significant for essential hypertension, dyslipidemia, hypothyroidism, TIA, CVA s/p PTCA and MCA stent, small brain aneurysm being followed by regular angiograms, spinal stenosis, breast cancer status postlumpectomy and radiotherapy and GERD, who presented to the emergency room with acute onset of significant anorexia with upset stomach and watery diarrhea without vomiting.  Her diarrhea started on Saturday and was last on Sunday then flared again on Monday.  She was more somnolent per her daughter.  She has been feeling significantly weak and she had to be held by her family to avoid falling.  Her symptoms started over the weekend and since then she had dozens of episodes of watery diarrhea.  Her abdominal discomfort has been intermittent cramps that is currently better.    PT Comments    OOB with min a x 1.  Time to gain balance in sitting.  She initially declined need to use bathroom but upon sitting she is inc of urine and loose BM.  To BSC with min a x 1 and RW.  She is unable to attend to her own needs due to balance deficits and does require mod/max assist for care.  She is able to walk 100' x 1 in hallway with RW but continues with R veer and needs almost constant assist to avoid objects and direct walker.  She remains up in chair for breakfast.  Unable to open items on tray and assist is given.  Initial recommendations for HHPT.  Pt does voice concerns over discharge home as she lives alone and was independent prior to admission with rollator with only occasional family support.  Given continues need for min a x 1 due to R veering, balance deficits evident during ADL and toileting tasks along with fatigue with limited activity and discussion with team, discharge recommendations have been adjusted  to SNF to reflect.  If family pt and family choose home, 24 hour care plus HH therapies would be needed. Discussed with primary PT.   Recommendations for follow up therapy are one component of a multi-disciplinary discharge planning process, led by the attending physician.  Recommendations may be updated based on patient status, additional functional criteria and insurance authorization.  Follow Up Recommendations  Skilled nursing-short term rehab (<3 hours/day)     Assistance Recommended at Discharge Frequent or constant Supervision/Assistance  Patient can return home with the following A little help with walking and/or transfers;Assistance with cooking/housework;Assist for transportation;A little help with bathing/dressing/bathroom;Help with stairs or ramp for entrance   Equipment Recommendations       Recommendations for Other Services       Precautions / Restrictions Precautions Precautions: Fall Restrictions Weight Bearing Restrictions: No     Mobility  Bed Mobility Overal bed mobility: Needs Assistance Bed Mobility: Supine to Sit     Supine to sit: Min assist       Patient Response: Cooperative  Transfers Overall transfer level: Needs assistance Equipment used: Rolling walker (2 wheels) Transfers: Sit to/from Stand Sit to Stand: Min assist                Ambulation/Gait Ambulation/Gait assistance: Min Web designer (Feet): 100 Feet Assistive device: Rolling walker (2 wheels) Gait Pattern/deviations: Step-through pattern, Decreased step length - right, Decreased step length - left, Drifts right/left Gait velocity: decreased     General  Gait Details: continues wiht consistant R vering and needing assist to navigate walker to avoid obstacles   Stairs             Wheelchair Mobility    Modified Rankin (Stroke Patients Only)       Balance Overall balance assessment: Needs assistance Sitting-balance support: Feet unsupported Sitting  balance-Leahy Scale: Good     Standing balance support: Bilateral upper extremity supported, During functional activity Standing balance-Leahy Scale: Poor Standing balance comment: +1 hands on assist needed due to diffiuclty steering walker and mananging ADL tasks                            Cognition Arousal/Alertness: Awake/alert Behavior During Therapy: WFL for tasks assessed/performed Overall Cognitive Status: Within Functional Limits for tasks assessed                                          Exercises Other Exercises Other Exercises: to [facility] due to inc BM x 2 unable to attend to her own needs    General Comments        Pertinent Vitals/Pain Pain Assessment Pain Assessment: No/denies pain    Home Living                          Prior Function            PT Goals (current goals can now be found in the care plan section) Progress towards PT goals: Progressing toward goals    Frequency    Min 2X/week      PT Plan Discharge plan needs to be updated    Co-evaluation              AM-PAC PT "6 Clicks" Mobility   Outcome Measure  Help needed turning from your back to your side while in a flat bed without using bedrails?: A Little Help needed moving from lying on your back to sitting on the side of a flat bed without using bedrails?: A Little Help needed moving to and from a bed to a chair (including a wheelchair)?: A Little Help needed standing up from a chair using your arms (e.g., wheelchair or bedside chair)?: A Little Help needed to walk in hospital room?: A Little Help needed climbing 3-5 steps with a railing? : A Lot 6 Click Score: 17    End of Session Equipment Utilized During Treatment: Gait belt Activity Tolerance: Patient limited by fatigue Patient left: in chair;with call bell/phone within reach;with chair alarm set Nurse Communication: Mobility status PT Visit Diagnosis: Other abnormalities of gait and  mobility (R26.89);Muscle weakness (generalized) (M62.81)     Time: [id number] PT Time Calculation (min) (ACUTE ONLY): 25 min  Charges:  $Gait Training: 8-22 mins $Therapeutic Activity: 8-22 mins                   Chesley Noon, PTA 07/30/22, 10:03 AM

## 2022-07-30 NOTE — Care Management Important Message (Signed)
Important Message  Patient Details  Name: Jodi Colon MRN: [id number] Date of Birth: 29-Aug-1929   Medicare Important Message Given:  Yes  I reviewed the Important Message from Medicare with the patient over the phone [phone]) as she is in an isolation room.  She stated she understood these rights and I wished her a speedy recovery and thanked her for time.   Juliann Pulse A Ollivander See 07/30/2022, 1:55 PM

## 2022-07-30 NOTE — NC FL2 (Signed)
[place] LEVEL OF CARE FORM     IDENTIFICATION  Patient Name: Jodi Colon Birthdate: 1930/01/30 Sex: female Admission Date (Current Location): 07/27/2022  [facility] and [place] Number:  Engineering geologist and Address:  [facility], [address], [place], [place] [address]      Provider Number: [id number]  Attending Physician Name and Address:  Val Riles, MD  Relative Name and Phone Number:  Marchia Meiers (Daughter) [phone] (    Current Level of Care: Hospital Recommended Level of Care: [place] Prior Approval Number:    Date Approved/Denied:   PASRR Number: [id number]  Discharge Plan: SNF    Current Diagnoses: Patient Active Problem List   Diagnosis Date Noted   Bacteremia due to Escherichia coli 07/29/2022   Hyponatremia 07/27/2022   Intractable diarrhea 07/27/2022   Acute renal failure ([place]) 07/27/2022   Syncope 01/29/2020   Intracranial vascular stenosis 01/29/2020   Falls frequently 12/17/2019   Seizure-like activity ([place]) 12/17/2019   Weakness 12/17/2019   Macular puckering, right eye 05/18/2019   Mechanical breakdown of intraocular lens 05/18/2019   Primary open angle glaucoma (POAG) of both eyes, moderate stage 05/18/2019   Pseudophakia of both eyes 05/18/2019   Status post total hip replacement, left 11/08/2017   SOBOE (shortness of breath on exertion) [id number]   Umbilical hernia without obstruction and without gangrene 07/28/2016   Restless leg 10/21/2015   Urinary frequency 10/15/2015   Dysuria 10/15/2015   Bursitis, trochanteric 04/02/2015   Trochanteric bursitis of right hip 04/02/2015   Hypothyroidism 03/04/2015   GERD without esophagitis 03/04/2015   Subarachnoid hemorrhage ([place]) 02/09/2015   Degenerative arthritis of lumbar spine 01/29/2015   DDD (degenerative disc disease), lumbar 01/29/2015   Arthritis, degenerative 01/28/2014   Infection with methicillin-resistant  Staphylococcus aureus 01/28/2014   Essential hypertension 01/28/2014   Dyslipidemia 01/28/2014   Carcinoma in situ, breast, ductal 01/28/2014   Depression 01/28/2014   Aneurysm, cerebral 01/28/2014   Intraductal carcinoma of breast 01/28/2014   Lumbar canal stenosis 12/07/2013   Neuritis or radiculitis due to rupture of lumbar intervertebral disc 12/07/2013   Cramp in lower leg 06/19/2013   Hemorrhoids that prolapse with straining, but retract spontaneously 01/24/2013   Scalp lesion 11/27/2012   Rectal prolapse 11/27/2012   Hemorrhoid 08/29/2012   Screening breast examination 07/12/2012   FOM (frequency of micturition) 02/28/2012   Urge incontinence 02/28/2012   History of neoplasm of bladder 02/28/2012   Excessive urination at night 02/28/2012   Bladder CA in situ 02/28/2012   History of methicillin resistant Staphylococcus aureus infection 12/30/2011   H/O infectious disease 12/30/2011   Cutaneous ulcer ([place]) 11/26/2011   Skin ulcer ([place]) 11/26/2011   Colon polyp 11/11/2011   Absolute anemia 11/11/2011   Back pain, chronic 11/01/2011   Abscess, gluteal, right 10/10/2011   Abscess of buttock 10/10/2011   Degenerative joint disease    Cerebrovascular disease    MRSA (methicillin resistant Staphylococcus aureus)    Hyperlipidemia LDL goal < 100 04/02/2011   Bladder cancer ([place]) 10/29/2009   Malignant neoplasm of urinary bladder ([place]) 10/29/2009    Orientation RESPIRATION BLADDER Height & Weight     Self, Time, Situation, Place  Normal External catheter, Incontinent Weight: 120 lb (54.4 kg) Height:  '4\' 9"'$  (144.8 cm)  BEHAVIORAL SYMPTOMS/MOOD NEUROLOGICAL BOWEL NUTRITION STATUS      Continent Diet  AMBULATORY STATUS COMMUNICATION OF NEEDS Skin   Limited Assist Verbally Normal  Personal Care Assistance Level of Assistance  Bathing, Feeding, Dressing Bathing Assistance: Limited assistance Feeding assistance: Limited assistance Dressing  Assistance: Limited assistance     Functional Limitations Info  Sight, Hearing, Speech Sight Info: Adequate Hearing Info: Adequate Speech Info: Adequate    SPECIAL CARE FACTORS FREQUENCY  PT (By licensed PT), OT (By licensed OT)     PT Frequency: 5 times a week OT Frequency: 5 times a week            Contractures Contractures Info: Not present    Additional Factors Info  Code Status, Isolation Precautions Code Status Info: DNR       Isolation Precautions Info: enteric precautions     Current Medications (07/30/2022):  This is the current hospital active medication list Current Facility-Administered Medications  Medication Dose Route Frequency Provider Last Rate Last Admin   0.9 %  sodium chloride infusion   Intravenous Continuous Val Riles, MD 50 mL/hr at 07/30/22 0932 Infusion Verify at 07/30/22 0932   acetaminophen (TYLENOL) tablet 650 mg  650 mg Oral Q6H PRN Mansy, Jan A, MD   650 mg at 07/28/22 1326   Or   acetaminophen (TYLENOL) suppository 650 mg  650 mg Rectal Q6H PRN Mansy, Jan A, MD       acidophilus (RISAQUAD) capsule 1 capsule  1 capsule Oral Daily Mansy, Jan A, MD   1 capsule at 07/30/22 0931   alum & mag hydroxide-simeth (MAALOX/MYLANTA) 200-200-20 MG/5ML suspension 15 mL  15 mL Oral Q6H PRN Val Riles, MD   15 mL at 07/27/22 2117   artificial tears (LACRILUBE) ophthalmic ointment   Both Eyes Daily PRN Mansy, Jan A, MD       atorvastatin (LIPITOR) tablet 10 mg  10 mg Oral BH-q7a Mansy, Jan A, MD   10 mg at 07/30/22 0931   cefTRIAXone (ROCEPHIN) 2 g in sodium chloride 0.9 % 100 mL IVPB  2 g Intravenous Q24H Tsosie Billing, MD   Stopped at 07/29/22 1701   cyanocobalamin (VITAMIN B12) tablet 1,000 mcg  1,000 mcg Oral Daily Mansy, Jan A, MD   1,000 mcg at 07/30/22 0931   fluticasone (FLONASE) 50 MCG/ACT nasal spray 1 spray  1 spray Each Nare Daily PRN Mansy, Jan A, MD       heparin injection 5,000 Units  5,000 Units Subcutaneous Q8H Mansy, Jan A, MD    5,000 Units at 07/30/22 [id number]   hydrOXYzine (ATARAX) tablet 25 mg  25 mg Oral TID PRN Val Riles, MD   25 mg at 07/27/22 1444   ipratropium-albuterol (DUONEB) 0.5-2.5 (3) MG/3ML nebulizer solution 3 mL  3 mL Nebulization Q6H PRN Val Riles, MD       iron polysaccharides (NIFEREX) capsule 150 mg  150 mg Oral BID Mansy, Jan A, MD   150 mg at 07/30/22 0932   [START ON 08/01/2022] levothyroxine (SYNTHROID) tablet 100 mcg  100 mcg Oral Once per day on Sun Mansy, Jan A, MD       levothyroxine (SYNTHROID) tablet 50 mcg  50 mcg Oral Once per day on Mon Tue Wed Thu Fri Sat Mansy, Jan A, MD   50 mcg at 07/30/22 [id number]   loperamide (IMODIUM) capsule 2 mg  2 mg Oral QID PRN Mansy, Jan A, MD       magnesium hydroxide (MILK OF MAGNESIA) suspension 30 mL  30 mL Oral Daily PRN Mansy, Jan A, MD       melatonin tablet 5 mg  5 mg Oral QHS  Val Riles, MD   5 mg at 07/29/22 2209   metoprolol tartrate (LOPRESSOR) tablet 25 mg  25 mg Oral BID Val Riles, MD   25 mg at 07/30/22 0931   multivitamin with minerals tablet 1 tablet  1 tablet Oral Daily Mansy, Jan A, MD   1 tablet at 07/30/22 0931   ondansetron [facility]) tablet 4 mg  4 mg Oral Q6H PRN Mansy, Jan A, MD       Or   ondansetron [facility]) injection 4 mg  4 mg Intravenous Q6H PRN Mansy, Jan A, MD       pantoprazole (PROTONIX) EC tablet 40 mg  40 mg Oral BID Mansy, Jan A, MD   40 mg at 07/30/22 0931   rOPINIRole (REQUIP) tablet 0.25 mg  0.25 mg Oral BID Val Riles, MD   0.25 mg at 07/30/22 [id number]   senna-docusate (Senokot-S) tablet 1 tablet  1 tablet Oral QHS PRN Mansy, Jan A, MD       traMADol Veatrice [facility]bon) tablet 50 mg  50 mg Oral Q12H PRN Val Riles, MD   50 mg at 07/28/22 0802   Vitamin D (Ergocalciferol) (DRISDOL) 1.25 MG (50000 UNIT) capsule 50,000 Units  50,000 Units Oral Q7 days Val Riles, MD   50,000 Units at 07/28/22 [id number]   Facility-Administered Medications Ordered in Other Encounters  Medication Dose Route Frequency Provider Last Rate Last Admin    gemcitabine (GEMZAR) 2,000 mg  2,000 mg Intravenous Once Lloyd Huger, MD       opium-belladonna (B&O SUPPRETTES) 16.2-30 MG suppository 30 mg  30 mg Rectal Once Hollice Espy, MD       opium-belladonna (B&O SUPPRETTES) 16.2-30 MG suppository 30 mg  30 mg Rectal Once Lloyd Huger, MD       opium-belladonna (B&O SUPPRETTES) 16.2-30 MG suppository 30 mg  30 mg Rectal Once Lloyd Huger, MD       White Petrolatum OINT 1 application  1 application  Apply externally Once Lloyd Huger, MD         Discharge Medications: Please see discharge summary for a list of discharge medications.  Relevant Imaging Results:  Relevant Lab Results:   Additional Information SS# [id number]  Gerilyn Pilgrim, LCSW

## 2022-07-30 NOTE — Progress Notes (Signed)
[facility] Progress Note  Patient: Jodi Colon    [id number]  DOA: 07/27/2022     Date of Service: the patient was seen and examined on 07/30/2022  Chief Complaint  Patient presents with   Diarrhea   Brief hospital course: Jodi Colon is a 89 y.o. female with medical history significant for essential hypertension, dyslipidemia, hypothyroidism, TIA, CVA s/p PTCA and MCA stent, small brain aneurysm being followed by regular angiograms, spinal stenosis, breast cancer status postlumpectomy and radiotherapy and GERD, who presented to the emergency room with acute onset of significant anorexia with upset stomach and watery diarrhea without vomiting.   ED workup: VS sinus tachycardia, hyponatremia sodium 128, creatinine 1.55, hypomagnesemia mag 1.5, hypophosphatemia Phos 2.2, anemia hemoglobin 10.9 GI pathogen and C. difficile stool sample was sent, blood cultures collected Patient was admitted under [facility] service for further management as below.    Assessment and Plan:  # E. coli gastroenteritis and bacteremia Sepsis ruled out, patient was not septic on admission WBC count within normal range Stool negative for C. difficile, GI pathogen positive for E. Coli Blood culture growing E. Coli, sensitive to cephalosporin and ciprofloxacin S/p aztreonam, d/cd on 2/28 and started ceftriaxone as per ID Continue probiotics ID consult appreciated 2/29 Repeat blood cultures NGTD  # Hypophosphatemia due to nutritional deficiency, phosphorus repleted orally.  # Hyponatremia most likely secondary to dehydration due to diarrhea Sodium 128--132--135 Continue to monitor daily  # Hypokalemia, potassium repleted. Monitor electrolytes and replete as needed.  # Paroxysmal SVT developed overnight on 2/28 Cardiology consult appreciated, recommended Lopressor 12.5 mg p.o. twice daily no any other workup. TSH 2.7 within normal range  # AKI secondary to dehydration Creatinine 1.55--1.32--1.08  gradually improving Continue NS 50 ml/hr  # Hypothyroidism, continue Synthroid home dose TSH 1.7 within normal range   # Depression - We will continue her Effexor XR.   # GERD without esophagitis, continue PPI therapy.   # Dyslipidemia, continue Lipitor 10 mg home dose    # Essential hypertension Discontinued amlodipine due to paroxysmal SVT but on Lopressor 12.5 mg p.o. twice daily 3/1 increase Lopressor 25 mg p.o. twice daily due to persistent high BP Monitor BP and titrate medications accordingly.  # Vitamin D deficiency: started vitamin D 50,000 units p.o. weekly, follow with PCP to repeat vitamin D level after 3 to 6 months.   Body mass index is 25.97 kg/m.  Interventions:      Diet: Heart healthy diet DVT Prophylaxis: Subcutaneous Heparin    Advance goals of care discussion: DNR  Family Communication: family was not present at bedside, at the time of interview.  The pt provided permission to discuss medical plan with the family. Opportunity was given to ask question and all questions were answered satisfactorily.   Disposition:  Pt is from Home, admitted with diarrhea, E. coli bacteremia, hyponatremia, still has electrolyte imbalance, on IV antibiotics, need to repeat blood cultures, ID consulted, which precludes a safe discharge. Discharge to SNF, when bed will be available.     Subjective: No significant event overnight, patient is still having diarrhea, 3 episodes yesterday and one episode in the morning.  Denies any abdominal pain, no nausea vomiting.  Denies any chest pain or palpitation, no shortness of breath.  Physical Exam: General: NAD, lying comfortably Appear in no distress, affect appropriate Eyes: PERRLA ENT: Oral Mucosa Clear, moist  Neck: no JVD,  Cardiovascular: S1 and S2 Present, no Murmur,  Respiratory: good respiratory effort, Bilateral Air  entry equal and Decreased, no Crackles, no wheezes Abdomen: Bowel Sound present, Soft and no  tenderness,  Skin: no rashes Extremities: no Pedal edema, no calf tenderness Neurologic: without any new focal findings Gait not checked due to patient safety concerns  Vitals:   07/29/22 1149 07/30/22 0640 07/30/22 0830 07/30/22 1133  BP: (!) 145/72 (!) 167/128 (!) 182/68 (!) 154/56  Pulse: 82 87 87 83  Resp: (!) '24 19 20 18  '$ Temp: 98.2 F (36.8 C) 98.8 F (37.1 C) 99.7 F (37.6 C) 98.1 F (36.7 C)  TempSrc: Oral Oral  Oral  SpO2: 95% 94% 95% 96%  Weight:      Height:        Intake/Output Summary (Last 24 hours) at 07/30/2022 1333 Last data filed at 07/30/2022 0932 Gross per 24 hour  Intake 1385.23 ml  Output --  Net 1385.23 ml   Filed Weights   07/26/22 2120  Weight: 54.4 kg    Data Reviewed: I have personally reviewed and interpreted daily labs, tele strips, imagings as discussed above. I reviewed all nursing notes, pharmacy notes, vitals, pertinent old records I have discussed plan of care as described above with RN and patient/family.  CBC: Recent Labs  Lab 07/26/22 2126 07/27/22 0432 07/28/22 0501 07/29/22 0521 07/30/22 0841  WBC 16.3* 10.2 7.9 8.0 7.7  HGB 12.5 10.9* 9.9* 10.3* 10.3*  HCT 36.9 32.4* 29.2* 31.0* 30.6*  MCV 88.7 87.8 88.5 89.1 88.7  PLT 137* 104* 105* 111* [id number]*   Basic Metabolic Panel: Recent Labs  Lab 07/27/22 0425 07/27/22 0432 07/28/22 0501 07/28/22 2043 07/29/22 0521 07/30/22 0841  NA  --  128* 132* 132* 135 138  K  --  3.5 2.8* 4.0 4.4 2.9*  CL  --  98 101 103 108 106  CO2  --  21* 23 21* 20* 23  GLUCOSE  --  113* 119* 142* 122* 103*  BUN  --  33* 30* 28* 27* 17  CREATININE  --  1.55* 1.32* 1.18* 1.08* 0.86  CALCIUM  --  8.0* 7.8* 7.8* 8.1* 7.7*  MG 1.5*  --  2.3 2.2 2.0 1.9  PHOS 2.2*  --  3.6  --  2.3* 3.6    Studies: No results found.  Scheduled Meds:  acidophilus  1 capsule Oral Daily   atorvastatin  10 mg Oral BH-q7a   cyanocobalamin  1,000 mcg Oral Daily   heparin injection (subcutaneous)  5,000 Units  Subcutaneous Q8H   iron polysaccharides  150 mg Oral BID   [START ON 07/31/2022] levothyroxine  50 mcg Oral Q0600   melatonin  5 mg Oral QHS   metoprolol tartrate  25 mg Oral BID   multivitamin with minerals  1 tablet Oral Daily   pantoprazole  40 mg Oral BID   potassium chloride  40 mEq Oral Q4H   rOPINIRole  0.25 mg Oral BID   Vitamin D (Ergocalciferol)  50,000 Units Oral Q7 days   Continuous Infusions:  sodium chloride 50 mL/hr at 07/30/22 1226   cefTRIAXone (ROCEPHIN)  IV Stopped (07/29/22 1701)   potassium chloride 10 mEq (07/30/22 1231)   PRN Meds: acetaminophen **OR** acetaminophen, alum & mag hydroxide-simeth, artificial tears, fluticasone, hydrOXYzine, ipratropium-albuterol, loperamide, magnesium hydroxide, ondansetron **OR** ondansetron (ZOFRAN) IV, senna-docusate, traMADol  Time spent: 50 minutes  Author: Val Riles. MD [facility] 07/30/2022 1:33 PM  To reach On-call, see care teams to locate the attending and reach out to them via [email or web address]. If 7PM-7AM, please contact night-coverage  If you still have difficulty reaching the attending provider, please page the [facility] (Director on Call) for [facility] on amion for assistance.

## 2022-07-31 ENCOUNTER — Inpatient Hospital Stay: Payer: Medicare Other

## 2022-07-31 ENCOUNTER — Inpatient Hospital Stay
Admit: 2022-07-31 | Discharge: 2022-07-31 | Disposition: A | Discharge status: Home / Self Care | Payer: Medicare Other | Attending: Student | Admitting: Student

## 2022-07-31 DIAGNOSIS — I5021 Acute systolic (congestive) heart failure: Secondary | ICD-10-CM

## 2022-07-31 LAB — BASIC METABOLIC PANEL
Anion gap: 7 (ref 5–15)
BUN: 11 mg/dL (ref 8–23)
CO2: 22 mmol/L (ref 22–32)
Calcium: 7.9 mg/dL — ABNORMAL LOW (ref 8.9–10.3)
Chloride: 108 mmol/L (ref 98–111)
Creatinine, Ser: 0.77 mg/dL (ref 0.44–1.00)
GFR, Estimated: >60 mL/min (ref >60)
Glucose, Bld: 103 mg/dL — ABNORMAL HIGH (ref 70–99)
Potassium: 3.8 mmol/L (ref 3.5–5.1)
Sodium: 137 mmol/L (ref 135–145)

## 2022-07-31 LAB — ECHOCARDIOGRAM COMPLETE
AR max vel: 0.8 cm2
AV Area VTI: 0.87 cm2
AV Area mean vel: 0.83 cm2
AV Mean grad: 11.5 mmHg
AV Peak grad: 21.2 mmHg
Ao pk vel: 2.3 m/s
Area-P 1/2: 3.65 cm2
S' Lateral: 2.3 cm

## 2022-07-31 LAB — CBC
HCT: 30.1 % — ABNORMAL LOW (ref 36.0–46.0)
Hemoglobin: 10 g/dL — ABNORMAL LOW (ref 12.0–15.0)
MCH: 29.2 pg (ref 26.0–34.0)
MCHC: 33.2 g/dL (ref 30.0–36.0)
MCV: 88 fL (ref 80.0–100.0)
Platelets: 167 10*3/uL (ref 150–400)
RBC: 3.42 MIL/uL — ABNORMAL LOW (ref 3.87–5.11)
RDW: 14 % (ref 11.5–15.5)
WBC: 7.9 10*3/uL (ref 4.0–10.5)
nRBC: 0 % (ref 0.0–0.2)

## 2022-07-31 LAB — URINE CULTURE: Culture: 4000 — AB

## 2022-07-31 LAB — PHOSPHORUS: Phosphorus: 2.4 mg/dL — ABNORMAL LOW (ref 2.5–4.6)

## 2022-07-31 LAB — MAGNESIUM: Magnesium: 1.7 mg/dL (ref 1.7–2.4)

## 2022-07-31 MED ORDER — HYDRALAZINE HCL 50 MG PO TABS: 50.0000 mg | ORAL_TABLET | Freq: Four times a day (QID) | ORAL | Status: DC | PRN

## 2022-07-31 MED ORDER — POTASSIUM CHLORIDE CRYS ER 20 MEQ PO TBCR
40.0000 meq | EXTENDED_RELEASE_TABLET | Freq: Once | ORAL | Status: AC
  Administered 2022-07-31: 40 meq via ORAL
  Filled 2022-07-31: qty 2

## 2022-07-31 MED ORDER — FUROSEMIDE 40 MG PO TABS: 40.0000 mg | ORAL_TABLET | Freq: Every day | ORAL | 0 refills | Status: DC

## 2022-07-31 MED ORDER — POTASSIUM CHLORIDE CRYS ER 10 MEQ PO TBCR: 10.0000 meq | EXTENDED_RELEASE_TABLET | Freq: Two times a day (BID) | ORAL | 0 refills | Status: DC

## 2022-07-31 MED ORDER — AMLODIPINE BESYLATE 5 MG PO TABS: 2.5000 mg | ORAL_TABLET | Freq: Every day | ORAL | 2 refills | Status: DC

## 2022-07-31 MED ORDER — FUROSEMIDE 10 MG/ML IJ SOLN
20.0000 mg | Freq: Two times a day (BID) | INTRAMUSCULAR | Status: DC
  Administered 2022-07-31: 20 mg via INTRAVENOUS
  Filled 2022-07-31: qty 2

## 2022-07-31 MED ORDER — METOPROLOL TARTRATE 50 MG PO TABS: 50.0000 mg | ORAL_TABLET | Freq: Two times a day (BID) | ORAL | Status: DC

## 2022-07-31 MED ORDER — K PHOS MONO-SOD PHOS DI & MONO 155-852-130 MG PO TABS
500.0000 mg | ORAL_TABLET | Freq: Three times a day (TID) | ORAL | Status: DC
  Administered 2022-07-31 (×2): 500 mg via ORAL
  Filled 2022-07-31 (×2): qty 2

## 2022-07-31 MED ORDER — ROPINIROLE HCL 0.25 MG PO TABS: 0.2500 mg | ORAL_TABLET | Freq: Two times a day (BID) | ORAL | Status: AC

## 2022-07-31 MED ORDER — CIPROFLOXACIN HCL 500 MG PO TABS: 500.0000 mg | ORAL_TABLET | Freq: Two times a day (BID) | ORAL | 0 refills | Status: AC

## 2022-07-31 MED ORDER — VITAMIN D (ERGOCALCIFEROL) 1.25 MG (50000 UNIT) PO CAPS: 50000.0000 [IU] | ORAL_CAPSULE | ORAL | Status: AC

## 2022-07-31 MED ORDER — MELATONIN 5 MG PO TABS: 5.0000 mg | ORAL_TABLET | Freq: Every day | ORAL | 0 refills | Status: DC

## 2022-07-31 NOTE — TOC Transition Note (Addendum)
Transition of Care [facility]) - CM/SW Discharge Note   Patient Details  Name: Jodi Colon MRN: [id number] Date of Birth: [birth year]  [facility] CM/SW Contact:  Izola Price, RN Phone Number: 07/31/2022, 1:32 PM   Clinical Narrative: 3/2: Charna Archer from [facility] returned call and can accept patient to facility today. Patient will be going to Room 41 and report to be called to [phone]. DC Summary will be inboxed and faxed to [facility]. Updated provider and Unit RN. EMS forms excluding DNR form for transport printed to unit printer. RN CM will call [facility] when ready for discharge. Simmie Davies RN CM.   Per [facility] request will also email DC Summary to [email or web address] and Fax to [phone] when DC Summary ready. Simmie Davies RN CM   230 pm: Spoke with daughter per Unit RN request regarding some questions. Daughter, Davy Pique, stated the provider had answered questions she had regarding some pending test results. I also corrected her understanding of patient was going to [facility], not [facility] and gave her the room number. Simmie Davies RN CM   430 pm [facility] called to transport to [facility]. Daughter aware. Simmie Davies RN CM   Final next level of care: Skilled Nursing Facility Barriers to Discharge: Barriers Resolved   Patient Goals and CMS Choice      Discharge Placement                Patient chooses bed at: [facility] Patient to be transferred to facility by: [place] Name of family member notified: Marchia Meiers (Daughter) [phone] (Mobile) Patient and family notified of of transfer:  (Per provider notes this am.)  Discharge Plan and Services Additional resources added to the After Visit Summary for                  DME Arranged: N/A DME Agency: NA       HH Arranged: NA [place] Agency: NA        Social Determinants of Health (SDOH) Interventions SDOH Screenings   Food Insecurity: No Food Insecurity (07/27/2022)   Housing: Low Risk  (07/27/2022)  Transportation Needs: No Transportation Needs (07/27/2022)  Utilities: Not At Risk (07/27/2022)  Tobacco Use: Low Risk  (07/26/2022)     Readmission Risk Interventions    07/28/2022    9:46 AM  Readmission Risk Prevention Plan  Transportation Screening Complete  PCP or Specialist Appt within 3-5 Days Complete  HRI or Home Care Consult Complete  Medication Review (RN Care Manager) Complete

## 2022-07-31 NOTE — Discharge Summary (Signed)
[facility] Discharge Summary   Patient: Jodi Colon [id number]  PCP: Jennette Dubin, NP  Date of admission: 07/27/2022   Date of discharge:  07/31/2022     Discharge Diagnoses:  Principal Problem:   Hyponatremia Active Problems:   Intractable diarrhea   Acute renal failure ([place])   Hypothyroidism   Essential hypertension   Dyslipidemia   GERD without esophagitis   Depression   Bacteremia due to Escherichia coli   Complicated UTI (urinary tract infection)   Diarrhea   Admitted From: Home Disposition:  SNF  Recommendations for Outpatient Follow-up:  Follow-up with PCP, patient should be seen by an MD in 1 to 2 days, continue to monitor BP and titrate medications accordingly. Follow-up with cardiology if persistent irregular rhythm, patient had paroxysmal SVT which has been resolved. Follow up LABS/TEST:     Contact information for follow-up providers     [name], Meredeth Ide, NP Follow up.   Specialty: Nurse Practitioner Contact information: [address] [place] [address] [phone]              Contact information for after-discharge care     [facility] .   Service: Skilled Nursing Contact information: [place] [phone]                    Diet recommendation: Cardiac diet  Activity: The patient is advised to gradually reintroduce usual activities, as tolerated  Discharge Condition: stable  Code Status: DNR   History of present illness: As per the H and P dictated on admission Hospital Course:  Jodi Colon is a 89 y.o. female with medical history significant for essential hypertension, dyslipidemia, hypothyroidism, TIA, CVA s/p PTCA and MCA stent, small brain aneurysm being followed by regular angiograms, spinal stenosis, breast cancer status postlumpectomy and radiotherapy and GERD, who presented to the  emergency room with acute onset of significant anorexia with upset stomach and watery diarrhea without vomiting.   ED workup: VS sinus tachycardia, hyponatremia sodium 128, creatinine 1.55, hypomagnesemia mag 1.5, hypophosphatemia Phos 2.2, anemia hemoglobin 10.9 GI pathogen and C. difficile stool sample was sent, blood cultures collected Patient was admitted under [facility] service for further management as below.   Assessment and Plan: # E. coli gastroenteritis and bacteremia. Sepsis ruled out, patient was not septic on admission. WBC count within normal range. Stool negative for C. difficile, GI pathogen positive for E. Coli. Blood culture growing E. Coli, sensitive to cephalosporin and ciprofloxacin. S/p aztreonam, d/cd on 2/28 and started ceftriaxone as per ID. S/p probiotics. 2/29 Repeat blood cultures NGTD. ID consulted, recommended ciprofloxacin till 08/08/2022. # Paroxysmal SVT developed overnight on 2/28. Cardiology consult appreciated, recommended Lopressor 12.5 mg p.o. twice daily no any other workup. TSH 2.7 within normal range. CXR consistent with pulmonary edema and trace pleural effusion, cardiomegaly. On 3/2 Lasix 20 mg IV one dose given. TTE LVEF 6065%, mild concentric LV hypertrophy, no any other significant findings.  #Pulmonary edema, c/o shortness of breath, could be due to IV fluid due to gastroenteritis.  Started Lasix 40 mg p.o. daily for 3 days along with potassium chloride 40 mEq daily for 3 days.  Monitor volume status and shortness of breath, further diuresis accordingly.  Repeat BMP after 1 week. # Essential hypertension: Started Lopressor 12.5 mg p.o. twice daily, on 3/1 increase Lopressor 25 mg p.o. twice daily, started amlodipine 5 mg POD  and on 3/2 increased Lopressor 50 mg p.o. twice daily due to persistent high BP. Monitor BP and titrate medications accordingly.  Use hydralazine as needed # Hypophosphatemia due to nutritional deficiency, phosphorus repleted orally. #  Hyponatremia most likely secondary to dehydration due to diarrhea Sodium 128--132--137, resolved # Hypokalemia, potassium repleted. Resolved # AKI secondary to dehydration, Creatinine 1.55--0.77 gradually improved, S/p NS 50 ml/hr, DC'd on 3/2 due to mild pulmonary edema and shortness of breath.  Started Lasix 20 mg IV x 1 dose followed by 40 mg p.o. daily for 3 days.  Repeat BMP to check renal functions after 1 week. # Hypothyroidism, continue Synthroid home dose, TSH 1.7 wnl # Depression, patient was on Effexor XR, but currently patient is not taking it. # GERD without esophagitis, continue PPI therapy. # Dyslipidemia, continue Lipitor 10 mg home dose # Vitamin D deficiency: started vitamin D 50,000 units p.o. weekly, follow with PCP to repeat vitamin D level after 3 to 6 months. # RLS (restless leg syndrome) started Requip.  Started melatonin due to insomnia. # Difficulty ambulation, leaning towards the right side while walking. On 3/2, discussed with patient's daughter she was concerned about a stroke, CT head negative for any acute findings. Body mass index is 25.97 kg/m.  Nutrition Interventions:  Patient was seen by physical therapy, who recommended Therapy, SNF placement, which was arranged. On the day of the discharge the patient's vitals were stable, and no other acute medical condition were reported by patient. the patient was felt safe to be discharge at [facility].  Consultants: Cardiology and ID  Procedures: None  Discharge Exam: General: Appear in no distress, no Rash; Oral Mucosa Clear, moist. Cardiovascular: S1 and S2 Present, no Murmur, Respiratory: normal respiratory effort, Bilateral Air entry present and no Crackles, no wheezes Abdomen: Bowel Sound present, Soft and no tenderness, no hernia Extremities: no Pedal edema, no calf tenderness Neurology: alert and oriented to time, place, and person affect appropriate.  Filed Weights   07/26/22 2120  Weight: 54.4 kg    Vitals:   07/31/22 0851 07/31/22 1203  BP: (!) 173/65 (!) 145/55  Pulse: 78 70  Resp: 16 16  Temp: 98.4 F (36.9 C) 98.2 F (36.8 C)  SpO2: 97% 96%    DISCHARGE MEDICATION: Allergies as of 07/31/2022       Reactions   Penicillins Rash   Has patient had a PCN reaction causing immediate rash, facial/tongue/throat swelling, SOB or lightheadedness with hypotension: Yes Has patient had a PCN reaction causing severe rash involving mucus membranes or skin necrosis: No Has patient had a PCN reaction that required hospitalization: No Has patient had a PCN reaction occurring within the last 10 years: No If all of the above answers are "NO", then may proceed with Cephalosporin use. Occurred as an adult & full body rash Rash Rash Occurred as an adult & full body rash Has patient had a PCN reaction causing immediate rash, facial/tongue/throat swelling, SOB or lightheadedness with hypotension: Yes Has patient had a PCN reaction causing severe rash involving mucus membranes or skin necrosis: No Has patient had a PCN reaction that required hospitalization: No Has patient had a PCN reaction occurring within the last 10 years: No If all of the above answers are "NO", then may proceed with Cephalosporin use. Has patient had a PCN reaction causing immediate rash, facial/tongue/throat swelling, SOB or lightheadedness with hypotension: Yes Has patient had a PCN reaction causing severe rash involving mucus membranes or skin necrosis: No Has patient  had a PCN reaction that required hospitalization: No Has patient had a PCN reaction occurring within the last 10 years: No If all of the above answers are "NO", then may proceed with Cephalosporin use. Occurred as an adult & full body rash Occurred as an adult & full body rash   Sulfa Antibiotics Swelling, Rash   Facial swelling   Sulfasalazine Swelling, Rash   "swelling of face"        Medication List     STOP taking these medications     aspirin 81 MG chewable tablet   cetirizine 10 MG tablet Commonly known as: ZYRTEC   cyanocobalamin 1000 MCG tablet   erythromycin ophthalmic ointment   iron polysaccharides 150 MG capsule Commonly known as: NIFEREX   loperamide 2 MG capsule Commonly known as: IMODIUM   prednisoLONE acetate 1 % ophthalmic suspension Commonly known as: PRED FORTE   senna-docusate 8.6-50 MG tablet Commonly known as: Senokot-S   timolol 0.5 % ophthalmic solution Commonly known as: TIMOPTIC   traMADol 50 MG tablet Commonly known as: ULTRAM   venlafaxine XR 75 MG 24 hr capsule Commonly known as: EFFEXOR-XR   VISTA GEL DRY EYE RELIEF OP       TAKE these medications    acetaminophen 325 MG tablet Commonly known as: TYLENOL Take 650 mg by mouth 2 (two) times daily.   amLODipine 5 MG tablet Commonly known as: NORVASC Take 0.5 tablets (2.5 mg total) by mouth daily. What changed: medication strength   atorvastatin 10 MG tablet Commonly known as: LIPITOR Take 10 mg by mouth every morning.   ciprofloxacin 500 MG tablet Commonly known as: Cipro Take 1 tablet (500 mg total) by mouth 2 (two) times daily for 8 days.   fluticasone 50 MCG/ACT nasal spray Commonly known as: FLONASE Place 1 spray into both nostrils daily as needed for allergies or rhinitis.   furosemide 40 MG tablet Commonly known as: Lasix Take 1 tablet (40 mg total) by mouth daily for 3 days.   hydrALAZINE 50 MG tablet Commonly known as: APRESOLINE Take 1 tablet (50 mg total) by mouth every 6 (six) hours as needed (SBP >150).   levothyroxine 50 MCG tablet Commonly known as: SYNTHROID Take 50 mcg by mouth daily before breakfast.   loratadine 10 MG tablet Commonly known as: CLARITIN Take 10 mg by mouth daily.   melatonin 5 MG Tabs Take 1 tablet (5 mg total) by mouth at bedtime.   metoprolol tartrate 50 MG tablet Commonly known as: LOPRESSOR Take 1 tablet (50 mg total) by mouth 2 (two) times daily.    multivitamin tablet Take 1 tablet by mouth daily.   potassium chloride 10 MEQ tablet Commonly known as: KLOR-CON M Take 1 tablet (10 mEq total) by mouth 2 (two) times daily for 3 days.   PROBIOTIC PO Take 1 capsule by mouth daily.   Protonix 40 MG tablet Generic drug: pantoprazole Take 40 mg by mouth 2 (two) times daily.   rOPINIRole 0.25 MG tablet Commonly known as: REQUIP Take 1 tablet (0.25 mg total) by mouth 2 (two) times daily.   Vitamin D (Ergocalciferol) 1.25 MG (50000 UNIT) Caps capsule Commonly known as: DRISDOL Take 1 capsule (50,000 Units total) by mouth every 7 (seven) days. Start taking on: August 04, 2022       Allergies  Allergen Reactions   Penicillins Rash    Has patient had a PCN reaction causing immediate rash, facial/tongue/throat swelling, SOB or lightheadedness with hypotension: Yes Has patient  had a PCN reaction causing severe rash involving mucus membranes or skin necrosis: No Has patient had a PCN reaction that required hospitalization: No Has patient had a PCN reaction occurring within the last 10 years: No If all of the above answers are "NO", then may proceed with Cephalosporin use.  Occurred as an adult & full body rash  Rash Rash Occurred as an adult & full body rash Has patient had a PCN reaction causing immediate rash, facial/tongue/throat swelling, SOB or lightheadedness with hypotension: Yes Has patient had a PCN reaction causing severe rash involving mucus membranes or skin necrosis: No Has patient had a PCN reaction that required hospitalization: No Has patient had a PCN reaction occurring within the last 10 years: No If all of the above answers are "NO", then may proceed with Cephalosporin use. Has patient had a PCN reaction causing immediate rash, facial/tongue/throat swelling, SOB or lightheadedness with hypotension: Yes Has patient had a PCN reaction causing severe rash involving mucus membranes or skin necrosis: No Has patient  had a PCN reaction that required hospitalization: No Has patient had a PCN reaction occurring within the last 10 years: No If all of the above answers are "NO", then may proceed with Cephalosporin use.  Occurred as an adult & full body rash Occurred as an adult & full body rash   Sulfa Antibiotics Swelling and Rash    Facial swelling   Sulfasalazine Swelling and Rash    "swelling of face"   Discharge Instructions     Call MD for:  difficulty breathing, headache or visual disturbances   Complete by: As directed    Call MD for:  extreme fatigue   Complete by: As directed    Call MD for:  persistant dizziness or light-headedness   Complete by: As directed    Call MD for:  persistant nausea and vomiting   Complete by: As directed    Call MD for:  severe uncontrolled pain   Complete by: As directed    Call MD for:  temperature >100.4   Complete by: As directed    Diet - low sodium heart healthy   Complete by: As directed    Discharge instructions   Complete by: As directed    Follow-up with PCP, patient should be seen by an MD in 1 to 2 days, continue to monitor BP and titrate medications accordingly. Follow-up with cardiology if persistent irregular rhythm, patient had paroxysmal SVT which has been resolved.   Increase activity slowly   Complete by: As directed        The results of significant diagnostics from this hospitalization (including imaging, microbiology, ancillary and laboratory) are listed below for reference.    Significant Diagnostic Studies: ECHOCARDIOGRAM COMPLETE  Result Date: 07/31/2022    ECHOCARDIOGRAM REPORT   Patient Name:   Jodi Colon Date of Exam: 07/31/2022 Medical Rec #:  [id number]     Height:       57.0 in Accession #:    [id number]    Weight:       120.0 lb Date of Birth:  1929/08/16    BSA:          1.448 m Patient Age:    28 years      BP:           173/65 mmHg Patient Gender: F             HR:           88 bpm.  Exam Location:  [facility] Procedure: 2D  Echo Indications:     CHF I50.21  History:         Patient has prior history of Echocardiogram examinations, most                  recent 02/11/2015.  Sonographer:     Kathlen Brunswick RDCS Referring Phys:  [place] Diagnosing Phys: [place]  1. Left ventricular ejection fraction, by estimation, is 60 to 65%. The left ventricle has normal function. The left ventricle has no regional wall motion abnormalities. There is mild concentric left ventricular hypertrophy. Left ventricular diastolic parameters were normal.  2. Right ventricular systolic function is normal. The right ventricular size is normal.  3. The mitral valve is normal in structure. Mild mitral valve regurgitation. No evidence of mitral stenosis.  4. The aortic valve is normal in structure. Aortic valve regurgitation is mild. Aortic valve sclerosis/calcification is present, without any evidence of aortic stenosis.  5. The inferior vena cava is normal in size with greater than 50% respiratory variability, suggesting right atrial pressure of 3 mmHg. FINDINGS  Left Ventricle: Left ventricular ejection fraction, by estimation, is 60 to 65%. The left ventricle has normal function. The left ventricle has no regional wall motion abnormalities. The left ventricular internal cavity size was normal in size. There is  mild concentric left ventricular hypertrophy. Left ventricular diastolic parameters were normal. Right Ventricle: The right ventricular size is normal. No increase in right ventricular wall thickness. Right ventricular systolic function is normal. Left Atrium: Left atrial size was normal in size. Right Atrium: Right atrial size was normal in size. Pericardium: There is no evidence of pericardial effusion. Mitral Valve: The mitral valve is normal in structure. Mild mitral valve regurgitation. No evidence of mitral valve stenosis. Tricuspid Valve: The tricuspid valve is normal in structure. Tricuspid valve regurgitation is  mild . No evidence of tricuspid stenosis. Aortic Valve: The aortic valve is normal in structure. Aortic valve regurgitation is mild. Aortic valve sclerosis/calcification is present, without any evidence of aortic stenosis. Aortic valve mean gradient measures 11.5 mmHg. Aortic valve peak gradient  measures 21.2 mmHg. Aortic valve area, by VTI measures 0.87 cm. Pulmonic Valve: The pulmonic valve was normal in structure. Pulmonic valve regurgitation is trivial. No evidence of pulmonic stenosis. Aorta: The aortic root is normal in size and structure. Venous: The inferior vena cava is normal in size with greater than 50% respiratory variability, suggesting right atrial pressure of 3 mmHg. IAS/Shunts: No atrial level shunt detected by color flow Doppler.  LEFT VENTRICLE PLAX 2D LVIDd:         3.60 cm   Diastology LVIDs:         2.30 cm   LV e' medial:    5.98 cm/s LV PW:         1.00 cm   LV E/e' medial:  20.2 LV IVS:        1.00 cm   LV e' lateral:   7.40 cm/s LVOT diam:     1.60 cm   LV E/e' lateral: 16.4 LV SV:         43 LV SV Index:   29 LVOT Area:     2.01 cm  RIGHT VENTRICLE RV Basal diam:  2.60 cm RV S prime:     11.30 cm/s TAPSE (M-mode): 2.4 cm LEFT ATRIUM             Index  RIGHT ATRIUM          Index LA diam:        4.50 cm 3.11 cm/m   RA Area:     9.08 cm LA Vol (A2C):   42.2 ml 29.15 ml/m  RA Volume:   17.80 ml 12.30 ml/m LA Vol (A4C):   62.0 ml 42.83 ml/m LA Biplane Vol: 56.0 ml 38.68 ml/m  AORTIC VALVE                     PULMONIC VALVE AV Area (Vmax):    0.80 cm      PV Vmax:       0.77 m/s AV Area (Vmean):   0.83 cm      PV Peak grad:  2.4 mmHg AV Area (VTI):     0.87 cm AV Vmax:           230.00 cm/s AV Vmean:          155.500 cm/s AV VTI:            0.490 m AV Peak Grad:      21.2 mmHg AV Mean Grad:      11.5 mmHg LVOT Vmax:         91.80 cm/s LVOT Vmean:        64.500 cm/s LVOT VTI:          0.212 m LVOT/AV VTI ratio: 0.43  AORTA Ao Root diam: 3.40 cm Ao Asc diam:  2.70 cm MITRAL  VALVE                TRICUSPID VALVE MV Area (PHT): 3.65 cm     TR Peak grad:   29.6 mmHg MV Decel Time: 208 msec     TR Vmax:        272.00 cm/s MV E velocity: 121.00 cm/s MV A velocity: 101.00 cm/s  SHUNTS MV E/A ratio:  1.20         Systemic VTI:  0.21 m                             Systemic Diam: 1.60 cm Neoma Laming Electronically signed by Neoma Laming Signature Date/Time: 07/31/2022/12:21:51 PM    Final    DG Chest Port 1 View  Result Date: 07/31/2022 CLINICAL DATA:  Shortness of breath EXAM: PORTABLE CHEST 1 VIEW COMPARISON:  12/06/2019 x-ray FINDINGS: Enlarged cardiopericardial silhouette with a calcified aorta. There is some slight central vascular congestion and some interstitial changes. A component of edema is not excluded. Also more focal left retrocardiac opacity with tiny effusions. No pneumothorax. Old left-sided upper rib fractures. Overlapping cardiac leads IMPRESSION: Enlarged heart with vascular congestion and possible trace edema. Left lung base opacity with tiny effusions.  Recommend follow-up Electronically Signed   By: Jill Side M.D.   On: 07/31/2022 11:01   CT HEAD WO CONTRAST (5MM)  Result Date: 07/31/2022 CLINICAL DATA:  Neuro deficit with acute stroke suspected. EXAM: CT HEAD WITHOUT CONTRAST TECHNIQUE: Contiguous axial images were obtained from the base of the skull through the vertex without intravenous contrast. RADIATION DOSE REDUCTION: This exam was performed according to the departmental dose-optimization program which includes automated exposure control, adjustment of the mA and/or kV according to patient size and/or use of iterative reconstruction technique. COMPARISON:  12/06/2019 FINDINGS: Brain: Right MCA stent. Chronic small vessel ischemia in the cerebral white matter with chronic lacunar infarcts that are progressed from  2021, clustered at the bilateral basal ganglia. No acute infarct, hemorrhage, hydrocephalus, or masslike finding. Small chronic right cerebellar  infarction. Vascular: No hyperdense vessel or unexpected calcification. Skull: Normal. Negative for fracture or focal lesion. Sinuses/Orbits: No acute finding. IMPRESSION: 1. No acute finding. 2. Extensive chronic small vessel ischemia with progression from 2021. Electronically Signed   By: Jorje Guild M.D.   On: 07/31/2022 10:51   CT ABDOMEN PELVIS WO CONTRAST  Result Date: 07/27/2022 CLINICAL DATA:  Diarrhea. EXAM: CT ABDOMEN AND PELVIS WITHOUT CONTRAST TECHNIQUE: Multidetector CT imaging of the abdomen and pelvis was performed following the standard protocol without IV contrast. RADIATION DOSE REDUCTION: This exam was performed according to the departmental dose-optimization program which includes automated exposure control, adjustment of the mA and/or kV according to patient size and/or use of iterative reconstruction technique. COMPARISON:  July 08, 2016 FINDINGS: Lower chest: Mild atelectasis is seen within the posterior aspects of the bilateral lung bases. Hepatobiliary: No focal liver abnormality is seen. Status post cholecystectomy. No biliary dilatation. Pancreas: Unremarkable. No pancreatic ductal dilatation or surrounding inflammatory changes. Spleen: Normal in size without focal abnormality. Adrenals/Urinary Tract: Adrenal glands are unremarkable. Kidneys are normal, without obstructing renal calculi, focal lesion, or hydronephrosis. A 2 mm nonobstructing renal calculus is seen within the mid left kidney. The urinary bladder is poorly distended and subsequently limited in evaluation. Bilateral nonspecific perinephric inflammatory fat stranding is noted. Mild diffuse urinary bladder wall thickening is seen. Stomach/Bowel: There is a large gastric hernia. The appendix is not clearly identified. No evidence of bowel wall thickening, distention, or inflammatory changes. Noninflamed diverticula are seen throughout the sigmoid colon. Vascular/Lymphatic: Aortic atherosclerosis. No enlarged abdominal  or pelvic lymph nodes. Reproductive: Status post hysterectomy. No adnexal masses. Other: A 3.2 cm x 1.7 cm x 3.1 cm midline, fat containing ventral hernia is seen within the mid to upper anterior abdominal wall. No abdominopelvic ascites. Musculoskeletal: There is a total left hip replacement with associated streak artifact and subsequently limited evaluation of the adjacent osseous and soft tissue structures. There is marked severity scoliosis of the lumbar spine with marked severity multilevel degenerative changes. IMPRESSION: 1. Large gastric hernia. 2. Sigmoid diverticulosis. 3. 2 mm nonobstructing left renal calculus. 4. Marked severity scoliosis of the lumbar spine with marked severity multilevel degenerative changes. 5. Aortic atherosclerosis. Aortic Atherosclerosis (ICD10-I70.0). Electronically Signed   By: Virgina Norfolk M.D.   On: 07/27/2022 00:45    Microbiology: Recent Results (from the past 240 hour(s))  Resp panel by RT-PCR (RSV, Flu A&B, Covid) Anterior Nasal Swab     Status: None   Collection Time: 07/26/22  9:26 PM   Specimen: Anterior Nasal Swab  Result Value Ref Range Status   SARS Coronavirus 2 by RT PCR NEGATIVE NEGATIVE Final    Comment: (NOTE) SARS-CoV-2 target nucleic acids are NOT DETECTED.  The SARS-CoV-2 RNA is generally detectable in upper respiratory specimens during the acute phase of infection. The lowest concentration of SARS-CoV-2 viral copies this assay can detect is 138 copies/mL. A negative result does not preclude SARS-Cov-2 infection and should not be used as the sole basis for treatment or other patient management decisions. A negative result may occur with  improper specimen collection/handling, submission of specimen other than nasopharyngeal swab, presence of viral mutation(s) within the areas targeted by this assay, and inadequate number of viral copies(<138 copies/mL). A negative result must be combined with clinical observations, patient history,  and epidemiological information. The expected result is Negative.  Fact  Sheet for Patients:  EntrepreneurPulse.com.au  Fact Sheet for Healthcare Providers:  IncredibleEmployment.be  This test is no t yet approved or cleared by the [place] FDA and  has been authorized for detection and/or diagnosis of SARS-CoV-2 by FDA under an Emergency Use Authorization (EUA). This EUA will remain  in effect (meaning this test can be used) for the duration of the COVID-19 declaration under Section 564(b)(1) of the Act, 21 U.S.C.section 360bbb-3(b)(1), unless the authorization is terminated  or revoked sooner.       Influenza A by PCR NEGATIVE NEGATIVE Final   Influenza B by PCR NEGATIVE NEGATIVE Final    Comment: (NOTE) The Xpert Xpress SARS-CoV-2/FLU/RSV plus assay is intended as an aid in the diagnosis of influenza from Nasopharyngeal swab specimens and should not be used as a sole basis for treatment. Nasal washings and aspirates are unacceptable for Xpert Xpress SARS-CoV-2/FLU/RSV testing.  Fact Sheet for Patients: EntrepreneurPulse.com.au  Fact Sheet for Healthcare Providers: IncredibleEmployment.be  This test is not yet approved or cleared by the [place] FDA and has been authorized for detection and/or diagnosis of SARS-CoV-2 by FDA under an Emergency Use Authorization (EUA). This EUA will remain in effect (meaning this test can be used) for the duration of the COVID-19 declaration under Section 564(b)(1) of the Act, 21 U.S.C. section 360bbb-3(b)(1), unless the authorization is terminated or revoked.     Resp Syncytial Virus by PCR NEGATIVE NEGATIVE Final    Comment: (NOTE) Fact Sheet for Patients: EntrepreneurPulse.com.au  Fact Sheet for Healthcare Providers: IncredibleEmployment.be  This test is not yet approved or cleared by the [place] FDA and has  been authorized for detection and/or diagnosis of SARS-CoV-2 by FDA under an Emergency Use Authorization (EUA). This EUA will remain in effect (meaning this test can be used) for the duration of the COVID-19 declaration under Section 564(b)(1) of the Act, 21 U.S.C. section 360bbb-3(b)(1), unless the authorization is terminated or revoked.  Performed at [facility], [address] [place], [place] [address]   Blood culture (single)     Status: Abnormal   Collection Time: 07/27/22  4:32 AM   Specimen: BLOOD  Result Value Ref Range Status   Specimen Description   Final    BLOOD RIGHT HAND Performed at [facility], [address], [place], [place] [address]    Special Requests   Final    BOTTLES DRAWN AEROBIC AND ANAEROBIC Blood Culture adequate volume Performed at [facility], [address] [place], [place] [address]    Culture  Setup Time   Final    GRAM NEGATIVE RODS IN BOTH AEROBIC AND ANAEROBIC BOTTLES Organism ID to follow CRITICAL RESULT CALLED TO, READ BACK BY AND VERIFIED WITH: JUSTIN MILLER AT [id number] ON 07/27/22 BY SS Performed at [facility], [address] [place], [place] [address]    Culture ESCHERICHIA COLI (A)  Final   Report Status 07/30/2022 FINAL  Final   Organism ID, Bacteria ESCHERICHIA COLI  Final      Susceptibility   Escherichia coli - MIC*    AMPICILLIN >=32 RESISTANT Resistant     CEFEPIME <=0.12 SENSITIVE Sensitive     CEFTAZIDIME <=1 SENSITIVE Sensitive     CEFTRIAXONE <=0.25 SENSITIVE Sensitive     CIPROFLOXACIN <=0.25 SENSITIVE Sensitive     GENTAMICIN <=1 SENSITIVE Sensitive     IMIPENEM <=0.25 SENSITIVE Sensitive     TRIMETH/SULFA <=20 SENSITIVE Sensitive     AMPICILLIN/SULBACTAM >=32 RESISTANT Resistant  PIP/TAZO <=4 SENSITIVE Sensitive     * ESCHERICHIA COLI  Blood Culture ID Panel (Reflexed)     Status: Abnormal   Collection Time: 07/27/22  4:32 AM  Result Value Ref Range Status    Enterococcus faecalis NOT DETECTED NOT DETECTED Final   Enterococcus Faecium NOT DETECTED NOT DETECTED Final   Listeria monocytogenes NOT DETECTED NOT DETECTED Final   Staphylococcus species NOT DETECTED NOT DETECTED Final   Staphylococcus aureus (BCID) NOT DETECTED NOT DETECTED Final   Staphylococcus epidermidis NOT DETECTED NOT DETECTED Final   Staphylococcus lugdunensis NOT DETECTED NOT DETECTED Final   Streptococcus species NOT DETECTED NOT DETECTED Final   Streptococcus agalactiae NOT DETECTED NOT DETECTED Final   Streptococcus pneumoniae NOT DETECTED NOT DETECTED Final   Streptococcus pyogenes NOT DETECTED NOT DETECTED Final   A.calcoaceticus-baumannii NOT DETECTED NOT DETECTED Final   Bacteroides fragilis NOT DETECTED NOT DETECTED Final   Enterobacterales DETECTED (A) NOT DETECTED Final    Comment: Enterobacterales represent a large order of gram negative bacteria, not a single organism. CRITICAL RESULT CALLED TO, READ BACK BY AND VERIFIED WITH: JUSTIN MILLER AT [id number] ON 07/27/22 BY SS    Enterobacter cloacae complex NOT DETECTED NOT DETECTED Final   Escherichia coli DETECTED (A) NOT DETECTED Final    Comment: CRITICAL RESULT CALLED TO, READ BACK BY AND VERIFIED WITH: JUSTIN MILLER AT [id number] ON 07/27/22 BY SS    Klebsiella aerogenes NOT DETECTED NOT DETECTED Final   Klebsiella oxytoca NOT DETECTED NOT DETECTED Final   Klebsiella pneumoniae NOT DETECTED NOT DETECTED Final   Proteus species NOT DETECTED NOT DETECTED Final   Salmonella species NOT DETECTED NOT DETECTED Final   Serratia marcescens NOT DETECTED NOT DETECTED Final   Haemophilus influenzae NOT DETECTED NOT DETECTED Final   Neisseria meningitidis NOT DETECTED NOT DETECTED Final   Pseudomonas aeruginosa NOT DETECTED NOT DETECTED Final   Stenotrophomonas maltophilia NOT DETECTED NOT DETECTED Final   Candida albicans NOT DETECTED NOT DETECTED Final   Candida auris NOT DETECTED NOT DETECTED Final   Candida glabrata NOT  DETECTED NOT DETECTED Final   Candida krusei NOT DETECTED NOT DETECTED Final   Candida parapsilosis NOT DETECTED NOT DETECTED Final   Candida tropicalis NOT DETECTED NOT DETECTED Final   Cryptococcus neoformans/gattii NOT DETECTED NOT DETECTED Final   CTX-M ESBL NOT DETECTED NOT DETECTED Final   Carbapenem resistance IMP NOT DETECTED NOT DETECTED Final   Carbapenem resistance KPC NOT DETECTED NOT DETECTED Final   Carbapenem resistance NDM NOT DETECTED NOT DETECTED Final   Carbapenem resist OXA 48 LIKE NOT DETECTED NOT DETECTED Final   Carbapenem resistance VIM NOT DETECTED NOT DETECTED Final    Comment: Performed at [facility], [place], [place] [address]  C Difficile Quick Screen w PCR reflex     Status: None   Collection Time: 07/27/22  2:09 PM   Specimen: STOOL  Result Value Ref Range Status   C Diff antigen NEGATIVE NEGATIVE Final   C Diff toxin NEGATIVE NEGATIVE Final   C Diff interpretation No C. difficile detected.  Final    Comment: Performed at [facility], [address] [place], [place] [address]  Gastrointestinal Panel by PCR , Stool     Status: Abnormal   Collection Time: 07/27/22  2:09 PM   Specimen: Stool  Result Value Ref Range Status   Campylobacter species NOT DETECTED NOT DETECTED Final   Plesimonas shigelloides NOT DETECTED NOT DETECTED Final   Salmonella species  NOT DETECTED NOT DETECTED Final   Yersinia enterocolitica NOT DETECTED NOT DETECTED Final   Vibrio species NOT DETECTED NOT DETECTED Final   Vibrio cholerae NOT DETECTED NOT DETECTED Final   Enteroaggregative E coli (EAEC) NOT DETECTED NOT DETECTED Final   Enteropathogenic E coli (EPEC) DETECTED (A) NOT DETECTED Final    Comment: RESULT CALLED TO, READ BACK BY AND VERIFIED WITH: GABRIEL BUTTF AT 1955 ON 07/27/22 BY SS    Enterotoxigenic E coli (ETEC) NOT DETECTED NOT DETECTED Final   Shiga like toxin producing E coli (STEC) NOT DETECTED NOT DETECTED Final    Shigella/Enteroinvasive E coli (EIEC) NOT DETECTED NOT DETECTED Final   Cryptosporidium NOT DETECTED NOT DETECTED Final   Cyclospora cayetanensis NOT DETECTED NOT DETECTED Final   Entamoeba histolytica NOT DETECTED NOT DETECTED Final   Giardia lamblia NOT DETECTED NOT DETECTED Final   Adenovirus F40/41 NOT DETECTED NOT DETECTED Final   Astrovirus NOT DETECTED NOT DETECTED Final   Norovirus GI/GII NOT DETECTED NOT DETECTED Final   Rotavirus A NOT DETECTED NOT DETECTED Final   Sapovirus (I, II, IV, and V) NOT DETECTED NOT DETECTED Final    Comment: Performed at [facility], [address] [place], [place] [address]  Urine Culture (for pregnant, neutropenic or urologic patients or patients with an indwelling urinary catheter)     Status: Abnormal   Collection Time: 07/28/22  5:11 PM   Specimen: Urine, Clean Catch  Result Value Ref Range Status   Specimen Description   Final    URINE, CLEAN CATCH Performed at [facility], [address] [place], [place] [address]    Special Requests   Final    NONE Performed at [facility], [address] [place], [place] [address]    Culture 4,000 COLONIES/mL ESCHERICHIA COLI (A)  Final   Report Status 07/31/2022 FINAL  Final   Organism ID, Bacteria ESCHERICHIA COLI (A)  Final      Susceptibility   Escherichia coli - MIC*    AMPICILLIN >=32 RESISTANT Resistant     CEFAZOLIN 32 INTERMEDIATE Intermediate     CEFEPIME <=0.12 SENSITIVE Sensitive     CEFTRIAXONE <=0.25 SENSITIVE Sensitive     CIPROFLOXACIN <=0.25 SENSITIVE Sensitive     GENTAMICIN <=1 SENSITIVE Sensitive     IMIPENEM <=0.25 SENSITIVE Sensitive     NITROFURANTOIN <=16 SENSITIVE Sensitive     TRIMETH/SULFA <=20 SENSITIVE Sensitive     AMPICILLIN/SULBACTAM >=32 RESISTANT Resistant     PIP/TAZO <=4 SENSITIVE Sensitive     * 4,000 COLONIES/mL ESCHERICHIA COLI  Culture, blood (Routine X 2) w Reflex to ID Panel     Status: None (Preliminary result)    Collection Time: 07/29/22  5:21 AM   Specimen: BLOOD RIGHT ARM  Result Value Ref Range Status   Specimen Description BLOOD RIGHT ARM  Final   Special Requests   Final    BOTTLES DRAWN AEROBIC AND ANAEROBIC Blood Culture adequate volume   Culture   Final    NO GROWTH 2 DAYS Performed at [facility], [address] [place], [place] [address]    Report Status PENDING  Incomplete  Culture, blood (Routine X 2) w Reflex to ID Panel     Status: None (Preliminary result)   Collection Time: 07/29/22  5:23 AM   Specimen: BLOOD RIGHT HAND  Result Value Ref Range Status   Specimen Description BLOOD RIGHT HAND  Final   Special Requests   Final    BOTTLES  DRAWN AEROBIC AND ANAEROBIC Blood Culture adequate volume   Culture   Final    NO GROWTH 2 DAYS Performed at [facility], [address] [place], [place] [address]    Report Status PENDING  Incomplete     Labs: CBC: Recent Labs  Lab 07/27/22 0432 07/28/22 0501 07/29/22 0521 07/30/22 0841 07/31/22 0545  WBC 10.2 7.9 8.0 7.7 7.9  HGB 10.9* 9.9* 10.3* 10.3* 10.0*  HCT 32.4* 29.2* 31.0* 30.6* 30.1*  MCV 87.8 88.5 89.1 88.7 88.0  PLT 104* 105* 111* 141* [id number]   Basic Metabolic Panel: Recent Labs  Lab 07/27/22 0425 07/27/22 0432 07/28/22 0501 07/28/22 2043 07/29/22 0521 07/30/22 0841 07/31/22 0545  NA  --    < > 132* 132* 135 138 137  K  --    < > 2.8* 4.0 4.4 2.9* 3.8  CL  --    < > 101 103 108 106 108  CO2  --    < > 23 21* 20* 23 22  GLUCOSE  --    < > 119* 142* 122* 103* 103*  BUN  --    < > 30* 28* 27* 17 11  CREATININE  --    < > 1.32* 1.18* 1.08* 0.86 0.77  CALCIUM  --    < > 7.8* 7.8* 8.1* 7.7* 7.9*  MG 1.5*  --  2.3 2.2 2.0 1.9 1.7  PHOS 2.2*  --  3.6  --  2.3* 3.6 2.4*   < > = values in this interval not displayed.   Liver Function Tests: Recent Labs  Lab 07/26/22 2126  AST 40  ALT 23  ALKPHOS 105  BILITOT 1.0  PROT 6.6  ALBUMIN 3.3*   Recent Labs  Lab 07/26/22 2126  LIPASE 29    No results for input(s): "AMMONIA" in the last 168 hours. Cardiac Enzymes: No results for input(s): "CKTOTAL", "CKMB", "CKMBINDEX", "TROPONINI" in the last 168 hours. BNP (last 3 results) Recent Labs    07/28/22 2043  BNP 866.2*   CBG: Recent Labs  Lab 07/29/22 1553  GLUCAP 143*    Time spent: 35 minutes  Signed:  Val Riles  [facility]  07/31/2022 2:40 PM

## 2022-07-31 NOTE — Progress Notes (Signed)
  Echocardiogram 2D Echocardiogram has been performed.  Claretta Fraise 07/31/2022, 11:34 AM

## 2022-07-31 NOTE — Progress Notes (Addendum)
Patient is being discharged to [facility] care.  AVS placed in discharge packet for receiving facility. Attempted to call many times to give report without success. Notified the case manager. Awaiting nursing from [facility] to call back.  Report given to Levada Dy, LPN from [facility]. Will call EMS to transport.

## 2022-07-31 NOTE — Progress Notes (Signed)
Patient discharged by  EMS to [place].  All personal belongs bagged and sent with patient.  Patient had a dry/clean depneds on at time of discharge

## 2022-07-31 NOTE — Progress Notes (Signed)
[facility] Progress Note  Patient: Jodi Colon    [id number]  DOA: 07/27/2022     Date of Service: the patient was seen and examined on 07/31/2022  Chief Complaint  Patient presents with   Diarrhea   Brief hospital course: Jodi Colon is a 89 y.o. female with medical history significant for essential hypertension, dyslipidemia, hypothyroidism, TIA, CVA s/p PTCA and MCA stent, small brain aneurysm being followed by regular angiograms, spinal stenosis, breast cancer status postlumpectomy and radiotherapy and GERD, who presented to the emergency room with acute onset of significant anorexia with upset stomach and watery diarrhea without vomiting.   ED workup: VS sinus tachycardia, hyponatremia sodium 128, creatinine 1.55, hypomagnesemia mag 1.5, hypophosphatemia Phos 2.2, anemia hemoglobin 10.9 GI pathogen and C. difficile stool sample was sent, blood cultures collected Patient was admitted under [facility] service for further management as below.    Assessment and Plan:  # E. coli gastroenteritis and bacteremia Sepsis ruled out, patient was not septic on admission WBC count within normal range Stool negative for C. difficile, GI pathogen positive for E. Coli Blood culture growing E. Coli, sensitive to cephalosporin and ciprofloxacin S/p aztreonam, d/cd on 2/28 and started ceftriaxone as per ID Continue probiotics ID consulted, recommended ciprofloxacin till 08/08/2022 2/29 Repeat blood cultures NGTD  # Hypophosphatemia due to nutritional deficiency, phosphorus repleted orally.  # Hyponatremia most likely secondary to dehydration due to diarrhea Sodium 128--132--137 Continue to monitor daily  # Hypokalemia, potassium repleted. Monitor electrolytes and replete as needed.  # Paroxysmal SVT developed overnight on 2/28 Cardiology consult appreciated, recommended Lopressor 12.5 mg p.o. twice daily no any other workup. TSH 2.7 within normal range CXR consistent with pulmonary  edema and trace pleural effusion, cardiomegaly. 3/2 Lasix 20 mg IV twice daily x 2 doses ordered Follow 2D echocardiogram   # AKI secondary to dehydration Creatinine 1.55--1.32--1.08 gradually improving S/p NS 50 ml/hr, DC'd on 3/2 due to mild pulmonary edema and shortness of breath   # Hypothyroidism, continue Synthroid home dose TSH 1.7 within normal range   # Depression - We will continue her Effexor XR.   # GERD without esophagitis, continue PPI therapy.   # Dyslipidemia, continue Lipitor 10 mg home dose    # Essential hypertension Discontinued amlodipine due to paroxysmal SVT but on Lopressor 12.5 mg p.o. twice daily 3/1 increase Lopressor 25 mg p.o. twice daily due to persistent high BP Monitor BP and titrate medications accordingly.  # Vitamin D deficiency: started vitamin D 50,000 units p.o. weekly, follow with PCP to repeat vitamin D level after 3 to 6 months.  # Difficulty ambulation, leaning towards the right side while walking. On 3/2, discussed with patient's daughter she was concerned about a stroke, CT head negative for any acute findings.   Body mass index is 25.97 kg/m.  Interventions:      Diet: Heart healthy diet DVT Prophylaxis: Subcutaneous Heparin    Advance goals of care discussion: DNR  Family Communication: family was not present at bedside, at the time of interview.  The pt provided permission to discuss medical plan with the family. Opportunity was given to ask question and all questions were answered satisfactorily.   Disposition:  Pt is from Home, admitted with diarrhea, E. coli bacteremia, hyponatremia, still has electrolyte imbalance, on IV antibiotics, need to repeat blood cultures, ID consulted, which precludes a safe discharge. Discharge to SNF, when bed will be available.     Subjective: No significant event overnight, patient  experienced some shortness of breath while ambulation, diarrhea is improving, denies any abdominal pain at  this time, no nausea vomiting.  No chest pain or palpitations.  Physical Exam: General: NAD, lying comfortably Appear in no distress, affect appropriate Eyes: PERRLA ENT: Oral Mucosa Clear, moist  Neck: no JVD,  Cardiovascular: S1 and S2 Present, no Murmur,  Respiratory: good respiratory effort, Bilateral Air entry equal and Decreased, no Crackles, no wheezes Abdomen: Bowel Sound present, Soft and no tenderness,  Skin: no rashes Extremities: no Pedal edema, no calf tenderness Neurologic: without any new focal findings Gait not checked due to patient safety concerns  Vitals:   07/31/22 0159 07/31/22 0431 07/31/22 0734 07/31/22 0851  BP: (!) 157/65 (!) 157/65 (!) 142/51 (!) 173/65  Pulse: 87 82 79 78  Resp:  '19 16 16  '$ Temp:  98.2 F (36.8 C) 98.6 F (37 C) 98.4 F (36.9 C)  TempSrc:   Oral   SpO2: 100% 94% 94% 97%  Weight:      Height:        Intake/Output Summary (Last 24 hours) at 07/31/2022 1108 Last data filed at 07/31/2022 1000 Gross per 24 hour  Intake 1519.75 ml  Output --  Net 1519.75 ml   Filed Weights   07/26/22 2120  Weight: 54.4 kg    Data Reviewed: I have personally reviewed and interpreted daily labs, tele strips, imagings as discussed above. I reviewed all nursing notes, pharmacy notes, vitals, pertinent old records I have discussed plan of care as described above with RN and patient/family.  CBC: Recent Labs  Lab 07/27/22 0432 07/28/22 0501 07/29/22 0521 07/30/22 0841 07/31/22 0545  WBC 10.2 7.9 8.0 7.7 7.9  HGB 10.9* 9.9* 10.3* 10.3* 10.0*  HCT 32.4* 29.2* 31.0* 30.6* 30.1*  MCV 87.8 88.5 89.1 88.7 88.0  PLT 104* 105* 111* 141* [id number]   Basic Metabolic Panel: Recent Labs  Lab 07/27/22 0425 07/27/22 0432 07/28/22 0501 07/28/22 2043 07/29/22 0521 07/30/22 0841 07/31/22 0545  NA  --    < > 132* 132* 135 138 137  K  --    < > 2.8* 4.0 4.4 2.9* 3.8  CL  --    < > 101 103 108 106 108  CO2  --    < > 23 21* 20* 23 22  GLUCOSE  --    < >  119* 142* 122* 103* 103*  BUN  --    < > 30* 28* 27* 17 11  CREATININE  --    < > 1.32* 1.18* 1.08* 0.86 0.77  CALCIUM  --    < > 7.8* 7.8* 8.1* 7.7* 7.9*  MG 1.5*  --  2.3 2.2 2.0 1.9 1.7  PHOS 2.2*  --  3.6  --  2.3* 3.6 2.4*   < > = values in this interval not displayed.    Studies: DG Chest Port 1 View  Result Date: 07/31/2022 CLINICAL DATA:  Shortness of breath EXAM: PORTABLE CHEST 1 VIEW COMPARISON:  12/06/2019 x-ray FINDINGS: Enlarged cardiopericardial silhouette with a calcified aorta. There is some slight central vascular congestion and some interstitial changes. A component of edema is not excluded. Also more focal left retrocardiac opacity with tiny effusions. No pneumothorax. Old left-sided upper rib fractures. Overlapping cardiac leads IMPRESSION: Enlarged heart with vascular congestion and possible trace edema. Left lung base opacity with tiny effusions.  Recommend follow-up Electronically Signed   By: Jill Side M.D.   On: 07/31/2022 11:01   CT HEAD  WO CONTRAST (5MM)  Result Date: 07/31/2022 CLINICAL DATA:  Neuro deficit with acute stroke suspected. EXAM: CT HEAD WITHOUT CONTRAST TECHNIQUE: Contiguous axial images were obtained from the base of the skull through the vertex without intravenous contrast. RADIATION DOSE REDUCTION: This exam was performed according to the departmental dose-optimization program which includes automated exposure control, adjustment of the mA and/or kV according to patient size and/or use of iterative reconstruction technique. COMPARISON:  12/06/2019 FINDINGS: Brain: Right MCA stent. Chronic small vessel ischemia in the cerebral white matter with chronic lacunar infarcts that are progressed from 2021, clustered at the bilateral basal ganglia. No acute infarct, hemorrhage, hydrocephalus, or masslike finding. Small chronic right cerebellar infarction. Vascular: No hyperdense vessel or unexpected calcification. Skull: Normal. Negative for fracture or focal  lesion. Sinuses/Orbits: No acute finding. IMPRESSION: 1. No acute finding. 2. Extensive chronic small vessel ischemia with progression from 2021. Electronically Signed   By: Jorje Guild M.D.   On: 07/31/2022 10:51    Scheduled Meds:  acidophilus  1 capsule Oral Daily   amLODipine  5 mg Oral Daily   atorvastatin  10 mg Oral BH-q7a   cyanocobalamin  1,000 mcg Oral Daily   furosemide  20 mg Intravenous BID   heparin injection (subcutaneous)  5,000 Units Subcutaneous Q8H   iron polysaccharides  150 mg Oral BID   levothyroxine  50 mcg Oral Q0600   melatonin  5 mg Oral QHS   metoprolol tartrate  25 mg Oral BID   multivitamin with minerals  1 tablet Oral Daily   pantoprazole  40 mg Oral BID   phosphorus  500 mg Oral TID   potassium chloride  40 mEq Oral Once   rOPINIRole  0.25 mg Oral BID   Vitamin D (Ergocalciferol)  50,000 Units Oral Q7 days   Continuous Infusions:  cefTRIAXone (ROCEPHIN)  IV Stopped (07/31/22 0150)   PRN Meds: acetaminophen **OR** acetaminophen, alum & mag hydroxide-simeth, artificial tears, fluticasone, hydrALAZINE, hydrALAZINE, hydrOXYzine, ipratropium-albuterol, loperamide, magnesium hydroxide, ondansetron **OR** ondansetron (ZOFRAN) IV, senna-docusate, traMADol  Time spent: 50 minutes  Author: Val Riles. MD [facility] 07/31/2022 11:08 AM  To reach On-call, see care teams to locate the attending and reach out to them via [email or web address]. If 7PM-7AM, please contact night-coverage If you still have difficulty reaching the attending provider, please page the [facility] (Director on Call) for [facility] on amion for assistance.

## 2022-07-31 NOTE — TOC Progression Note (Addendum)
Transition of Care [facility]) - Progression Note    Patient Details  Name: Jodi Colon MRN: [id number] Date of Birth: November 06, 1929  [facility] CM/SW Contact  Izola Price, RN Phone Number: 07/31/2022, 12:17 PM  Clinical Narrative: 3/2: Authorization for SNF at [facility] approved through 08/03/22. Nh auth ID. [id number]. Reached out to [facility] regarding admission today. Pending return answer. 1218 pm. Simmie Davies RNCM  110 pm Attempted to call [facility] but call keeps "failing". Had sent secure message to their confidential line. Pending an answer. Simmie Davies RN CM     Expected Discharge Plan: [place] Barriers to Discharge: Continued Medical Work up  Expected Discharge Plan and Services                                               Social Determinants of Health (SDOH) Interventions SDOH Screenings   Food Insecurity: No Food Insecurity (07/27/2022)  Housing: Low Risk  (07/27/2022)  Transportation Needs: No Transportation Needs (07/27/2022)  Utilities: Not At Risk (07/27/2022)  Tobacco Use: Low Risk  (07/26/2022)    Readmission Risk Interventions    07/28/2022    9:46 AM  Readmission Risk Prevention Plan  Transportation Screening Complete  PCP or Specialist Appt within 3-5 Days Complete  HRI or Home Care Consult Complete  Medication Review (RN Care Manager) Complete

## 2022-08-03 LAB — CULTURE, BLOOD (ROUTINE X 2)
Culture: NO GROWTH
Culture: NO GROWTH
Special Requests: ADEQUATE
Special Requests: ADEQUATE

## 2022-09-21 ENCOUNTER — Ambulatory Visit (INDEPENDENT_AMBULATORY_CARE_PROVIDER_SITE_OTHER): Payer: Medicare Other | Admitting: Podiatry

## 2022-09-21 DIAGNOSIS — M79674 Pain in right toe(s): Secondary | ICD-10-CM | POA: Diagnosis not present

## 2022-09-21 DIAGNOSIS — B351 Tinea unguium: Secondary | ICD-10-CM | POA: Diagnosis not present

## 2022-09-21 DIAGNOSIS — M79675 Pain in left toe(s): Secondary | ICD-10-CM | POA: Diagnosis not present

## 2022-09-21 NOTE — Progress Notes (Signed)
   Chief Complaint  Patient presents with   Nail Problem    Routine foot care, nail trim     SUBJECTIVE Patient presents to office today complaining of elongated, thickened nails that cause pain while ambulating in shoes.  Patient is unable to trim their own nails. Patient is here for further evaluation and treatment.  Past Medical History:  Diagnosis Date   Adnexal cyst    Arthritis    Bladder cancer (HCC) 04/2015   Brain aneurysm    small, followed with regular angiograms, Dr. Corliss Skains   Breast cancer [facility]) 2007   DCIS-Right, s/p lumpectomy/xrt   Cerebrovascular disease 2008   s/p PTCA/stent of MCA, [facility]war   Chronic kidney disease    Complication of anesthesia    2003- trouble waking up after fracture surgery, no issues with anesthesia since   DDD (degenerative disc disease), cervical    Degenerative joint disease    [facility]   GERD (gastroesophageal reflux disease)    Headache    History of hiatal hernia    History of MRSA infection    History of TIA (transient ischemic attack)    Hx MRSA infection    Hyperlipidemia    Hypertension    Hypothyroidism    Incomplete bladder emptying    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 Derm Skin is dry and supple bilateral. Negative open lesions or macerations. Remaining integument unremarkable. Nails are tender, long, thickened and dystrophic with subungual debris, consistent with onychomycosis, 1-5 bilateral. No signs of infection noted. Vasc  DP and PT pedal pulses palpable bilaterally. Temperature gradient within normal limits.  Neuro Epicritic and protective threshold sensation  grossly intact bilaterally.  Musculoskeletal Exam No symptomatic pedal deformities noted bilateral. Muscular strength within normal limits.  ASSESSMENT 1.  Pain due to onychomycosis of toenails both  PLAN OF CARE 1. Patient evaluated today.  2. Instructed to maintain good pedal hygiene and foot care.  3. Mechanical debridement of nails 1-5 bilaterally performed using a nail nipper. Filed with dremel without incident.  4. Return to clinic in 3 mos.    Felecia Shelling, DPM [facility]  Dr. Felecia Shelling, DPM    [address] [place], [place] [address]                Office [phone]  Fax [phone]

## 2022-12-21 ENCOUNTER — Ambulatory Visit: Payer: Medicare Other | Admitting: Podiatry

## 2023-01-07 ENCOUNTER — Ambulatory Visit (INDEPENDENT_AMBULATORY_CARE_PROVIDER_SITE_OTHER): Payer: Medicare Other | Admitting: Podiatry

## 2023-01-07 DIAGNOSIS — B351 Tinea unguium: Secondary | ICD-10-CM

## 2023-01-07 DIAGNOSIS — M79675 Pain in left toe(s): Secondary | ICD-10-CM | POA: Diagnosis not present

## 2023-01-07 DIAGNOSIS — M79674 Pain in right toe(s): Secondary | ICD-10-CM

## 2023-01-11 ENCOUNTER — Encounter: Payer: Self-pay | Admitting: Oncology

## 2023-01-11 ENCOUNTER — Inpatient Hospital Stay: Payer: Medicare Other

## 2023-01-11 ENCOUNTER — Inpatient Hospital Stay: Payer: Medicare Other | Attending: Oncology | Admitting: Oncology

## 2023-01-11 VITALS — BP 127/46 | HR 68 | Temp 96.8°F | Resp 16 | Ht <= 58 in | Wt 112.7 lb

## 2023-01-11 DIAGNOSIS — Z8551 Personal history of malignant neoplasm of bladder: Secondary | ICD-10-CM | POA: Insufficient documentation

## 2023-01-11 DIAGNOSIS — D649 Anemia, unspecified: Secondary | ICD-10-CM | POA: Diagnosis present

## 2023-01-11 DIAGNOSIS — Z87440 Personal history of urinary (tract) infections: Secondary | ICD-10-CM | POA: Insufficient documentation

## 2023-01-11 DIAGNOSIS — C678 Malignant neoplasm of overlapping sites of bladder: Secondary | ICD-10-CM | POA: Diagnosis not present

## 2023-01-11 DIAGNOSIS — R32 Unspecified urinary incontinence: Secondary | ICD-10-CM | POA: Insufficient documentation

## 2023-01-11 LAB — CBC (CANCER CENTER ONLY)
HCT: 41.3 % (ref 36.0–46.0)
Hemoglobin: 13.3 g/dL (ref 12.0–15.0)
MCH: 29 pg (ref 26.0–34.0)
MCHC: 32.2 g/dL (ref 30.0–36.0)
MCV: 90 fL (ref 80.0–100.0)
Platelet Count: 227 10*3/uL (ref 150–400)
RBC: 4.59 MIL/uL (ref 3.87–5.11)
RDW: 13 % (ref 11.5–15.5)
WBC Count: 8 10*3/uL (ref 4.0–10.5)
nRBC: 0 % (ref 0.0–0.2)

## 2023-01-11 LAB — FERRITIN: Ferritin: 11 ng/mL (ref 11–307)

## 2023-01-11 LAB — FOLATE: Folate: 13.8 ng/mL (ref >5.9)

## 2023-01-11 LAB — IRON AND TIBC
Iron: 76 ug/dL (ref 28–170)
Saturation Ratios: 17 % (ref 10.4–31.8)
TIBC: 437 ug/dL (ref 250–450)
UIBC: 361 ug/dL

## 2023-01-11 NOTE — Progress Notes (Signed)
[facility]  [phone]) [phone] [phone]  ID: Jodi Colon OB: 1929/11/17  MR#: [id number]  IRJ#:[id number]  Patient Care Team: Nona Dell, NP as PCP - General (Nurse Practitioner)  CHIEF COMPLAINT: History of noninvasive bladder cancer, anemia.  INTERVAL HISTORY: Patient is a 89 year old female who has a history of noninvasive bladder cancer requiring BCG treatments.  She has not been seen or evaluated by urology or had a cystoscopy in greater than 2 years.  She is referred for further evaluation patient reports recurrent, chronic UTIs but otherwise feels well.  She denies any pain or hematuria.  She has no neurologic complaints.  She denies any recent fevers or illnesses.  She has a good appetite and denies weight loss.  She has no chest pain, shortness of breath, cough, or hemoptysis.  She denies any nausea, vomiting, constipation, or diarrhea.  She has chronic incontinence.  Patient offers no further specific complaints.  REVIEW OF SYSTEMS:   Review of Systems  Constitutional: Negative.  Negative for fever, malaise/fatigue and weight loss.  Respiratory: Negative.  Negative for cough, hemoptysis and shortness of breath.   Cardiovascular: Negative.  Negative for chest pain and leg swelling.  Gastrointestinal: Negative.  Negative for abdominal pain.  Genitourinary:  Positive for frequency. Negative for dysuria, hematuria and urgency.  Musculoskeletal: Negative.  Negative for back pain.  Skin: Negative.  Negative for rash.  Neurological: Negative.  Negative for dizziness, focal weakness, weakness and headaches.  Psychiatric/Behavioral: Negative.  The patient is not nervous/anxious.     As per HPI. Otherwise, a complete review of systems is negative.  PAST MEDICAL HISTORY: Past Medical History:  Diagnosis Date   Adnexal cyst    Arthritis    Bladder cancer (HCC) 04/2015   Brain aneurysm    small, followed with regular angiograms, Dr.  Corliss Skains   Breast cancer [facility]) 2007   DCIS-Right, s/p lumpectomy/xrt   Cerebrovascular disease 2008   s/p PTCA/stent of MCA, Deveshwar   Chronic kidney disease    Complication of anesthesia    2003- trouble waking up after fracture surgery, no issues with anesthesia since   DDD (degenerative disc disease), cervical    Degenerative joint disease    [facility]   GERD (gastroesophageal reflux disease)    Headache    History of hiatal hernia    History of MRSA infection    History of TIA (transient ischemic attack)    Hx MRSA infection    Hyperlipidemia    Hypertension    Hypothyroidism    Incomplete bladder emptying    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)    MRSA (methicillin resistant Staphylococcus aureus) 2008   perineal abscess   Nocturia    Pneumonia    Radiation 2007   BREAST CA   Spinal stenosis    Stroke [facility])    Thyroid disease    Urge incontinence     PAST SURGICAL HISTORY: Past Surgical History:  Procedure Laterality Date   ABDOMINAL HYSTERECTOMY     AQUEOUS SHUNT Left 01/05/2016   Procedure: AQUEOUS SHUNT;  Surgeon: Sherald Hess, MD;  [facility]: [facility];  Service: Ophthalmology;  Laterality: Left;  AHMED VALVE WITH SCLERAL PATCH GRAFT   AQUEOUS SHUNT Right 07/19/2016   Procedure: AQUEOUS SHUNT;  Surgeon: Sherald Hess, MD;  Location: [facility];  Service: Ophthalmology;  Laterality: Right;  Ahmed tube shunt   BACK SURGERY     BLADDER  SURGERY  2012, 2014   BRAIN SURGERY Right 2008   stent, [facility], [place]   BREAST LUMPECTOMY Right 2007   DCIS, Lumpectomy F/U radiation    CARPAL TUNNEL RELEASE Left 07/2017   CATARACT EXTRACTION     CHOLECYSTECTOMY     COLONOSCOPY  2013   CYSTOSCOPY W/ RETROGRADES Bilateral 05/20/2015   Procedure: CYSTOSCOPY WITH RETROGRADE PYELOGRAM;  Surgeon: Vanna Scotland, MD;  Location: [facility] ORS;  Service: Urology;  Laterality: Bilateral;    CYSTOSCOPY W/ RETROGRADES Bilateral 12/24/2015   Procedure: CYSTOSCOPY WITH RETROGRADE PYELOGRAM;  Surgeon: Vanna Scotland, MD;  Location: [facility];  Service: Urology;  Laterality: Bilateral;   CYSTOSCOPY WITH BIOPSY N/A 05/20/2015   Procedure: CYSTOSCOPY WITH BIOPSY;  Surgeon: Vanna Scotland, MD;  Location: [facility] OR[facility];  Service: Urology;  Laterality: N/A;   CYSTOSCOPY WITH BIOPSY N/A 12/24/2015   Procedure: CYSTOSCOPY WITH BIOPSY;  Surgeon: Vanna Scotland, MD;  Location: [facility] OR[facility];  Service: Urology;  Laterality: N/A;   EYE SURGERY Bilateral    Cataract Extraction, glaucoma surgery 2018, 2019   FRACTURE SURGERY Left    Ankle, compound fracture   HEMORRHOID SURGERY  02/01/13   LAPAROSCOPIC HYSTERECTOMY     TONSILLECTOMY     TOTAL HIP ARTHROPLASTY Left 11/08/2017   Procedure: TOTAL HIP ARTHROPLASTY ANTERIOR APPROACH;  Surgeon: Kennedy Bucker, MD;  Location: [facility] OR[facility];  Service: Orthopedics;  Laterality: Left;    FAMILY HISTORY: Family History  Problem Relation Age of Onset   Fibromyalgia Daughter    Breast cancer Neg Hx     ADVANCED DIRECTIVES (Y/N):  N  HEALTH MAINTENANCE: Social History   Tobacco Use   Smoking status: Never   Smokeless tobacco: Never  Vaping Use   Vaping status: Never Used  Substance Use Topics   Alcohol use: No   Drug use: No     Colonoscopy:  PAP:  Bone density:  Lipid panel:  Allergies  Allergen Reactions   Penicillins Rash    Has patient had a PCN reaction causing immediate rash, facial/tongue/throat swelling, SOB or lightheadedness with hypotension: Yes Has patient had a PCN reaction causing severe rash involving mucus membranes or skin necrosis: No Has patient had a PCN reaction that required hospitalization: No Has patient had a PCN reaction occurring within the last 10 years: No If all of the above answers are "NO", then may proceed with Cephalosporin use.  Occurred as an adult & full body rash  Rash Rash Occurred as an adult & full body  rash Has patient had a PCN reaction causing immediate rash, facial/tongue/throat swelling, SOB or lightheadedness with hypotension: Yes Has patient had a PCN reaction causing severe rash involving mucus membranes or skin necrosis: No Has patient had a PCN reaction that required hospitalization: No Has patient had a PCN reaction occurring within the last 10 years: No If all of the above answers are "NO", then may proceed with Cephalosporin use. Has patient had a PCN reaction causing immediate rash, facial/tongue/throat swelling, SOB or lightheadedness with hypotension: Yes Has patient had a PCN reaction causing severe rash involving mucus membranes or skin necrosis: No Has patient had a PCN reaction that required hospitalization: No Has patient had a PCN reaction occurring within the last 10 years: No If all of the above answers are "NO", then may proceed with Cephalosporin use.  Occurred as an adult & full body rash Occurred as an adult & full body rash   Sulfa Antibiotics Swelling and Rash    Facial swelling  Sulfamethoxazole Rash   Sulfasalazine Swelling and Rash    "swelling of face"    Current Outpatient Medications  Medication Sig Dispense Refill   acetaminophen (TYLENOL) 325 MG tablet Take 650 mg by mouth 2 (two) times daily.     atorvastatin (LIPITOR) 10 MG tablet Take 10 mg by mouth every morning.      fluticasone (FLONASE) 50 MCG/ACT nasal spray Place 1 spray into both nostrils daily as needed for allergies or rhinitis.     hydrALAZINE (APRESOLINE) 50 MG tablet Take 1 tablet (50 mg total) by mouth every 6 (six) hours as needed (SBP >150).     hydrOXYzine (ATARAX) 10 MG tablet Take 10 mg by mouth every 8 (eight) hours as needed.     levothyroxine (SYNTHROID, LEVOTHROID) 50 MCG tablet Take 50 mcg by mouth daily before breakfast.     loratadine (CLARITIN) 10 MG tablet Take 10 mg by mouth daily.     melatonin 5 MG TABS Take 1 tablet (5 mg total) by mouth at bedtime.  0   Multiple  Vitamin (MULTIVITAMIN) tablet Take 1 tablet by mouth daily.     pantoprazole (PROTONIX) 40 MG tablet Take 40 mg by mouth 2 (two) times daily.      Probiotic Product (PROBIOTIC PO) Take 1 capsule by mouth daily.     rOPINIRole (REQUIP) 0.25 MG tablet Take 1 tablet (0.25 mg total) by mouth 2 (two) times daily.     venlafaxine (EFFEXOR) 75 MG tablet Take 75 mg by mouth daily.     Vitamin D, Ergocalciferol, (DRISDOL) 1.25 MG (50000 UNIT) CAPS capsule Take 1 capsule (50,000 Units total) by mouth every 7 (seven) days. 5 capsule    amLODipine (NORVASC) 5 MG tablet Take 0.5 tablets (2.5 mg total) by mouth daily. (Patient not taking: Reported on 01/11/2023) 15 tablet 2   furosemide (LASIX) 40 MG tablet Take 1 tablet (40 mg total) by mouth daily for 3 days. (Patient not taking: Reported on 01/11/2023) 3 tablet 0   metoprolol tartrate (LOPRESSOR) 50 MG tablet Take 1 tablet (50 mg total) by mouth 2 (two) times daily. (Patient not taking: Reported on 01/11/2023)     potassium chloride (KLOR-CON M) 10 MEQ tablet Take 1 tablet (10 mEq total) by mouth 2 (two) times daily for 3 days. (Patient not taking: Reported on 01/11/2023) 6 tablet 0   No current facility-administered medications for this visit.   Facility-Administered Medications Ordered in Other Visits  Medication Dose Route Frequency Provider Last Rate Last Admin   gemcitabine (GEMZAR) 2,000 mg  2,000 mg Intravenous Once Jeralyn Ruths, MD       opium-belladonna (B&O SUPPRETTES) 16.2-30 MG suppository 30 mg  30 mg Rectal Once Vanna Scotland, MD       opium-belladonna (B&O SUPPRETTES) 16.2-30 MG suppository 30 mg  30 mg Rectal Once Jeralyn Ruths, MD       opium-belladonna (B&O SUPPRETTES) 16.2-30 MG suppository 30 mg  30 mg Rectal Once Jeralyn Ruths, MD       White Petrolatum OINT 1 application  1 application  Apply externally Once Jeralyn Ruths, MD        OBJECTIVE: Vitals:   01/11/23 1114  BP: (!) 127/46  Pulse: 68  Resp: 16   Temp: (!) 96.8 F (36 C)  SpO2: 99%     Body mass index is 24.39 kg/m.    ECOG FS:0 - Asymptomatic  General: Well-developed, well-nourished, no acute distress. Eyes: Pink conjunctiva, anicteric sclera. HEENT:  Normocephalic, moist mucous membranes. Lungs: No audible wheezing or coughing. Heart: Regular rate and rhythm. Abdomen: Soft, nontender, no obvious distention. Musculoskeletal: No edema, cyanosis, or clubbing. Neuro: Alert, answering all questions appropriately. Cranial nerves grossly intact. Skin: No rashes or petechiae noted. Psych: Normal affect. Lymphatics: No cervical, calvicular, axillary or inguinal LAD.   LAB RESULTS:  Lab Results  Component Value Date   NA 137 07/31/2022   K 3.8 07/31/2022   CL 108 07/31/2022   CO2 22 07/31/2022   GLUCOSE 103 (H) 07/31/2022   BUN 11 07/31/2022   CREATININE 0.77 07/31/2022   CALCIUM 7.9 (L) 07/31/2022   PROT 6.6 07/26/2022   ALBUMIN 3.3 (L) 07/26/2022   AST 40 07/26/2022   ALT 23 07/26/2022   ALKPHOS 105 07/26/2022   BILITOT 1.0 07/26/2022   GFRNONAA >60 07/31/2022   GFRAA >60 12/06/2019    Lab Results  Component Value Date   WBC 7.9 07/31/2022   NEUTROABS 2.9 12/06/2019   HGB 10.0 (L) 07/31/2022   HCT 30.1 (L) 07/31/2022   MCV 88.0 07/31/2022   PLT 167 07/31/2022     STUDIES: No results found.  ASSESSMENT: History of noninvasive bladder cancer, anemia.  PLAN:    History of noninvasive bladder cancer: Previously treated at [facility].  Patient has previously required BCG treatments, but appears none for at least 2 years.  She has not had repeat cystoscopy in greater than 2 years as well.  Her most recent imaging with CT scan on July 27, 2022 did not reveal any evidence of malignancy.  Patient was given a referral to urology to reestablish care and consideration of cystoscopy.  No follow-up has been scheduled at this time. Anemia: Patient's most recent hemoglobin is 10.0.  Repeat laboratory work from  today including iron stores, B12, and folate are pending at time of dictation. Incontinence/recurrent UTIs: Urology referral as above.  I spent a total of 60 minutes reviewing chart data, face-to-face evaluation with the patient, counseling and coordination of care as detailed above.  Patient expressed understanding and was in agreement with this plan. She also understands that She can call clinic at any time with any questions, concerns, or complaints.    Cancer Staging  No matching staging information was found for the patient.   Jeralyn Ruths, MD   01/11/2023 12:43 PM

## 2023-01-12 LAB — VITAMIN B12: Vitamin B-12: 4133 pg/mL — ABNORMAL HIGH (ref 180–914)

## 2023-01-17 NOTE — Progress Notes (Signed)
   No chief complaint on file.   SUBJECTIVE Patient presents to office today complaining of elongated, thickened nails that cause pain while ambulating in shoes.  Patient is unable to trim their own nails. Patient is here for further evaluation and treatment.  Past Medical History:  Diagnosis Date   Adnexal cyst    Arthritis    Bladder cancer ([place]) 04/2015   Brain aneurysm    small, followed with regular angiograms, Dr. Estanislado Pandy   Breast cancer [facility]) 2007   DCIS-Right, s/p lumpectomy/xrt   Cerebrovascular disease 2008   s/p PTCA/stent of MCA, Deveshwar   Chronic kidney disease    Complication of anesthesia    2003- trouble waking up after fracture surgery, no issues with anesthesia since   DDD (degenerative disc disease), cervical    Degenerative joint disease    managed by [facility]   GERD (gastroesophageal reflux disease)    Headache    History of hiatal hernia    History of MRSA infection    History of TIA (transient ischemic attack)    Hx MRSA infection    Hyperlipidemia    Hypertension    Hypothyroidism    Incomplete bladder emptying    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 Derm Skin is dry and supple bilateral. Negative open lesions or macerations. Remaining integument unremarkable. Nails are tender, long, thickened and dystrophic with subungual debris, consistent with onychomycosis, 1-5 bilateral. No signs of infection noted. Vasc  DP and PT pedal pulses palpable bilaterally. Temperature gradient within normal limits.  Neuro Epicritic and protective threshold sensation grossly intact bilaterally.  Musculoskeletal Exam No symptomatic  pedal deformities noted bilateral. Muscular strength within normal limits.  ASSESSMENT 1.  Pain due to onychomycosis of toenails both  PLAN OF CARE 1. Patient evaluated today.  2. Instructed to maintain good pedal hygiene and foot care.  3. Mechanical debridement of nails 1-5 bilaterally performed using a nail nipper. Filed with dremel without incident.  4. Return to clinic in 3 mos.    Edrick Kins, DPM [facility]  Dr. Edrick Kins, DPM    [address], [place] [address]                Office [phone]  Fax [phone]

## 2023-02-02 ENCOUNTER — Inpatient Hospital Stay: Payer: Medicare Other | Attending: Oncology | Admitting: Hospice and Palliative Medicine

## 2023-02-02 DIAGNOSIS — C678 Malignant neoplasm of overlapping sites of bladder: Secondary | ICD-10-CM

## 2023-02-02 NOTE — Progress Notes (Signed)
Multidisciplinary Oncology Council Documentation  Jodi Colon was presented by our [facility] on 02/02/2023, which included representatives from:  Palliative Care Dietitian  Physical/Occupational Therapist Nurse Navigator Genetics Social work Survivorship RN Financial Navigator Research RN   Jodi Colon currently presents with history of bladder cancer  We reviewed previous medical and familial history, history of present illness, and recent lab results along with all available histopathologic and imaging studies. The MOC considered available treatment options and made the following recommendations/referrals:  None currently  The MOC is a meeting of clinicians from various specialty areas who evaluate and discuss patients for whom a multidisciplinary approach is being considered. Final determinations in the plan of care are those of the provider(s).   Today's extended care, comprehensive team conference, Jodi Colon was not present for the discussion and was not examined.

## 2023-02-15 ENCOUNTER — Ambulatory Visit (INDEPENDENT_AMBULATORY_CARE_PROVIDER_SITE_OTHER): Payer: Medicare Other | Admitting: Urology

## 2023-02-15 ENCOUNTER — Encounter: Payer: Self-pay | Admitting: Urology

## 2023-02-15 VITALS — BP 138/76 | HR 94 | Ht <= 58 in | Wt 112.0 lb

## 2023-02-15 DIAGNOSIS — R350 Frequency of micturition: Secondary | ICD-10-CM

## 2023-02-15 DIAGNOSIS — Z87440 Personal history of urinary (tract) infections: Secondary | ICD-10-CM

## 2023-02-15 DIAGNOSIS — N952 Postmenopausal atrophic vaginitis: Secondary | ICD-10-CM

## 2023-02-15 DIAGNOSIS — N3281 Overactive bladder: Secondary | ICD-10-CM

## 2023-02-15 DIAGNOSIS — C678 Malignant neoplasm of overlapping sites of bladder: Secondary | ICD-10-CM

## 2023-02-15 LAB — MICROSCOPIC EXAMINATION

## 2023-02-15 LAB — BLADDER SCAN AMB NON-IMAGING

## 2023-02-15 LAB — URINALYSIS, COMPLETE
Bilirubin, UA: NEGATIVE
Glucose, UA: NEGATIVE
Ketones, UA: NEGATIVE
Nitrite, UA: NEGATIVE
Protein,UA: NEGATIVE
RBC, UA: NEGATIVE
Specific Gravity, UA: 1.01 (ref 1.005–1.030)
Urobilinogen, Ur: 0.2 mg/dL (ref 0.2–1.0)
pH, UA: 6 (ref 5.0–7.5)

## 2023-02-15 MED ORDER — ESTRADIOL 0.1 MG/GM VA CREA
TOPICAL_CREAM | VAGINAL | 12 refills | Status: DC
Start: 2023-02-15 — End: 2024-03-20

## 2023-02-15 MED ORDER — GEMTESA 75 MG PO TABS
1.0000 | ORAL_TABLET | Freq: Every day | ORAL | Status: DC
Start: 2023-02-15 — End: 2023-03-17

## 2023-02-15 NOTE — Progress Notes (Signed)
I,Amy L Pierron,acting as a scribe for Vanna Scotland, MD.,have documented all relevant documentation on the behalf of Vanna Scotland, MD,as directed by  Vanna Scotland, MD while in the presence of Vanna Scotland, MD.  02/15/2023 4:08 PM   Jodi Colon 1930-03-13 [id number]  Referring provider: Jeralyn Ruths, MD [address] [place],  [place] [address]  Chief Complaint  Patient presents with   New Patient (Initial Visit)    HPI: 89 year-old female presents today for further evaluation of treatment options.  She has a personal history of carcinoma in situ of the bladder, status post BCG x2, induction along with a partial induction with evidence of recurrent residual disease.   She was last seen in our office in 2018 and was sent for consultation at [facility]. She was deemed not to be a surgical candidate for cystectomy. She was, however, felt to be a surgical candidate by Dr. Berneice Heinrich, but declined more recently.  She was being followed in the GU Oncology Clinic at [facility] and had recurrent/persistent CIS and TURBT in 2020. She had another TURBT in 2021 that showed high grade TA TCC, as well as multifocal CIS. Her last surgery was in February 2022 which again showed high grade TA TCC and CIS. Her last cystoscopy in the office was in September 2022 which showed a tiny pavlov recurrence. Because of her goals of care, she elected to forego further intervention for this small tumor including TURBT's or office cystoscopies. She hasn't been seen at [facility] since September 2022.  In the interim, she was referred to medical oncology, Dr. Orlie Dakin, for her history of bladder cancer. He had no further recommendations other than consideration of repeat cystoscopy.   Her last imaging was on 07/27/2022 in the form of CT abdomen pelvis without contrast was performed for the purpose of evaluation of diarrhea. It showed an incidental 2mm left kidney stone, poorly distended bladder with bilateral nonspecific  perinephric stranding, and mild diffused bladder wall thickening. She also had a large incidental ventral hernia.  Her creatinine's been stable at 0.77 as of March 2024.  She also has a personal history of severe refractory urinary incontinence, which is chronic. She's been tried and failed on essentially all intervention and medications.  She is accompanied today by her daughter, Lissa Hoard.  She mentions having an issue with urinary leakage. She doesn't like to go out much due to the urgency and frequency. She doesn't think she fully empties her bladder.  She has burning most of the time and had a UTI in March.   She uses a Purewick and has multiple wet pads per day. She uses Vagisil.   She is not interested in anymore procedures.  Results for orders placed or performed in visit on 02/15/23  Bladder Scan (Post Void Residual) in office  Result Value Ref Range   Scan Result 25ml     PMH: Past Medical History:  Diagnosis Date   Adnexal cyst    Arthritis    Bladder cancer (HCC) 04/2015   Brain aneurysm    small, followed with regular angiograms, Dr. Corliss Skains   Breast cancer [facility]) 2007   DCIS-Right, s/p lumpectomy/xrt   Cerebrovascular disease 2008   s/p PTCA/stent of MCA, Deveshwar   Chronic kidney disease    Complication of anesthesia    2003- trouble waking up after fracture surgery, no issues with anesthesia since   DDD (degenerative disc disease), cervical    Degenerative joint disease    [facility]   GERD (gastroesophageal reflux disease)    Headache    History of hiatal hernia    History of MRSA infection    History of TIA (transient ischemic attack)    Hx MRSA infection    Hyperlipidemia    Hypertension    Hypothyroidism    Incomplete bladder emptying    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)    MRSA (methicillin resistant Staphylococcus aureus) 2008   perineal abscess   Nocturia    Pneumonia    Radiation 2007   BREAST  CA   Spinal stenosis    Stroke [facility])    Thyroid disease    Urge incontinence     Surgical History: Past Surgical History:  Procedure Laterality Date   ABDOMINAL HYSTERECTOMY     AQUEOUS SHUNT Left 01/05/2016   Procedure: AQUEOUS SHUNT;  Surgeon: Sherald Hess, MD;  Location: [facility];  Service: Ophthalmology;  Laterality: Left;  AHMED VALVE WITH SCLERAL PATCH GRAFT   AQUEOUS SHUNT Right 07/19/2016   Procedure: AQUEOUS SHUNT;  Surgeon: Sherald Hess, MD;  Location: [facility];  Service: Ophthalmology;  Laterality: Right;  Ahmed tube shunt   BACK SURGERY     BLADDER SURGERY  2012, 2014   BRAIN SURGERY Right 2008   stent, [facility], [place]   BREAST LUMPECTOMY Right 2007   DCIS, Lumpectomy F/U radiation    CARPAL TUNNEL RELEASE Left 07/2017   CATARACT EXTRACTION     CHOLECYSTECTOMY     COLONOSCOPY  2013   CYSTOSCOPY W/ RETROGRADES Bilateral 05/20/2015   Procedure: CYSTOSCOPY WITH RETROGRADE PYELOGRAM;  Surgeon: Vanna Scotland, MD;  Location: [facility];  Service: Urology;  Laterality: Bilateral;   CYSTOSCOPY W/ RETROGRADES Bilateral 12/24/2015   Procedure: CYSTOSCOPY WITH RETROGRADE PYELOGRAM;  Surgeon: Vanna Scotland, MD;  Location: [facility];  Service: Urology;  Laterality: Bilateral;   CYSTOSCOPY WITH BIOPSY N/A 05/20/2015   Procedure: CYSTOSCOPY WITH BIOPSY;  Surgeon: Vanna Scotland, MD;  Location: [facility];  Service: Urology;  Laterality: N/A;   CYSTOSCOPY WITH BIOPSY N/A 12/24/2015   Procedure: CYSTOSCOPY WITH BIOPSY;  Surgeon: Vanna Scotland, MD;  Location: [facility];  Service: Urology;  Laterality: N/A;   EYE SURGERY Bilateral    Cataract Extraction, glaucoma surgery 2018, 2019   FRACTURE SURGERY Left    Ankle, compound fracture   HEMORRHOID SURGERY  02/01/13   LAPAROSCOPIC HYSTERECTOMY     TONSILLECTOMY     TOTAL HIP ARTHROPLASTY Left 11/08/2017   Procedure: TOTAL HIP ARTHROPLASTY ANTERIOR APPROACH;  Surgeon: Kennedy Bucker, MD;   Location: [facility];  Service: Orthopedics;  Laterality: Left;    Home Medications:  Allergies as of 02/15/2023       Reactions   Penicillins Rash   Has patient had a PCN reaction causing immediate rash, facial/tongue/throat swelling, SOB or lightheadedness with hypotension: Yes Has patient had a PCN reaction causing severe rash involving mucus membranes or skin necrosis: No Has patient had a PCN reaction that required hospitalization: No Has patient had a PCN reaction occurring within the last 10 years: No If all of the above answers are "NO", then may proceed with Cephalosporin use. Occurred as an adult & full body rash Rash Rash Occurred as an adult & full body rash Has patient had a PCN reaction causing immediate rash, facial/tongue/throat swelling, SOB or lightheadedness with hypotension: Yes Has patient had a PCN reaction causing severe rash involving mucus membranes or skin necrosis: No Has  patient had a PCN reaction that required hospitalization: No Has patient had a PCN reaction occurring within the last 10 years: No If all of the above answers are "NO", then may proceed with Cephalosporin use. Has patient had a PCN reaction causing immediate rash, facial/tongue/throat swelling, SOB or lightheadedness with hypotension: Yes Has patient had a PCN reaction causing severe rash involving mucus membranes or skin necrosis: No Has patient had a PCN reaction that required hospitalization: No Has patient had a PCN reaction occurring within the last 10 years: No If all of the above answers are "NO", then may proceed with Cephalosporin use. Occurred as an adult & full body rash Occurred as an adult & full body rash   Sulfa Antibiotics Swelling, Rash   Facial swelling   Ciprofloxacin Other (See Comments)   Sulfamethoxazole Rash   Sulfasalazine Swelling, Rash   "swelling of face"        Medication List        Accurate as of February 15, 2023  4:08 PM. If you have any questions,  ask your nurse or doctor.          acetaminophen 325 MG tablet Commonly known as: TYLENOL Take 650 mg by mouth 2 (two) times daily.   amLODipine 5 MG tablet Commonly known as: NORVASC Take 0.5 tablets (2.5 mg total) by mouth daily.   atorvastatin 10 MG tablet Commonly known as: LIPITOR Take 10 mg by mouth every morning.   estradiol 0.1 MG/GM vaginal cream Commonly known as: ESTRACE Estrogen Cream Instruction Discard applicator Apply pea sized amount to tip of finger to urethra before bed. Wash hands well after application. Use Monday, Wednesday and Friday Started by: Vanna Scotland   fluticasone 50 MCG/ACT nasal spray Commonly known as: FLONASE Place 1 spray into both nostrils daily as needed for allergies or rhinitis.   furosemide 40 MG tablet Commonly known as: Lasix Take 1 tablet (40 mg total) by mouth daily for 3 days.   Gemtesa 75 MG Tabs Generic drug: Vibegron Take 1 tablet (75 mg total) by mouth daily. Started by: Vanna Scotland   hydrALAZINE 50 MG tablet Commonly known as: APRESOLINE Take 1 tablet (50 mg total) by mouth every 6 (six) hours as needed (SBP >150).   hydrOXYzine 10 MG tablet Commonly known as: ATARAX Take 10 mg by mouth every 8 (eight) hours as needed.   levothyroxine 50 MCG tablet Commonly known as: SYNTHROID Take 50 mcg by mouth daily before breakfast.   loratadine 10 MG tablet Commonly known as: CLARITIN Take 10 mg by mouth daily.   melatonin 5 MG Tabs Take 1 tablet (5 mg total) by mouth at bedtime.   metoprolol tartrate 50 MG tablet Commonly known as: LOPRESSOR Take 1 tablet (50 mg total) by mouth 2 (two) times daily.   multivitamin tablet Take 1 tablet by mouth daily.   potassium chloride 10 MEQ tablet Commonly known as: KLOR-CON M Take 1 tablet (10 mEq total) by mouth 2 (two) times daily for 3 days.   PROBIOTIC PO Take 1 capsule by mouth daily.   Protonix 40 MG tablet Generic drug: pantoprazole Take 40 mg by mouth 2  (two) times daily.   rOPINIRole 0.25 MG tablet Commonly known as: REQUIP Take 1 tablet (0.25 mg total) by mouth 2 (two) times daily.   venlafaxine 75 MG tablet Commonly known as: EFFEXOR Take 75 mg by mouth daily.   Vitamin D (Ergocalciferol) 1.25 MG (50000 UNIT) Caps capsule Commonly known as: DRISDOL Take  1 capsule (50,000 Units total) by mouth every 7 (seven) days.        Allergies:  Allergies  Allergen Reactions   Penicillins Rash    Has patient had a PCN reaction causing immediate rash, facial/tongue/throat swelling, SOB or lightheadedness with hypotension: Yes Has patient had a PCN reaction causing severe rash involving mucus membranes or skin necrosis: No Has patient had a PCN reaction that required hospitalization: No Has patient had a PCN reaction occurring within the last 10 years: No If all of the above answers are "NO", then may proceed with Cephalosporin use.  Occurred as an adult & full body rash  Rash Rash Occurred as an adult & full body rash Has patient had a PCN reaction causing immediate rash, facial/tongue/throat swelling, SOB or lightheadedness with hypotension: Yes Has patient had a PCN reaction causing severe rash involving mucus membranes or skin necrosis: No Has patient had a PCN reaction that required hospitalization: No Has patient had a PCN reaction occurring within the last 10 years: No If all of the above answers are "NO", then may proceed with Cephalosporin use. Has patient had a PCN reaction causing immediate rash, facial/tongue/throat swelling, SOB or lightheadedness with hypotension: Yes Has patient had a PCN reaction causing severe rash involving mucus membranes or skin necrosis: No Has patient had a PCN reaction that required hospitalization: No Has patient had a PCN reaction occurring within the last 10 years: No If all of the above answers are "NO", then may proceed with Cephalosporin use.  Occurred as an adult & full body  rash Occurred as an adult & full body rash   Sulfa Antibiotics Swelling and Rash    Facial swelling   Ciprofloxacin Other (See Comments)   Sulfamethoxazole Rash   Sulfasalazine Swelling and Rash    "swelling of face"    Family History: Family History  Problem Relation Age of Onset   Fibromyalgia Daughter    Breast cancer Neg Hx     Social History:  reports that she has never smoked. She has never been exposed to tobacco smoke. She has never used smokeless tobacco. She reports that she does not drink alcohol and does not use drugs.   Physical Exam: BP 138/76   Pulse 94   Ht 4\' 9"  (1.448 m)   Wt 112 lb (50.8 kg)   BMI 24.24 kg/m   Constitutional:  Alert and oriented, No acute distress. HEENT: [place] AT, moist mucus membranes.  Trachea midline, no masses. Neurologic: Grossly intact, no focal deficits, moving all 4 extremities. Psychiatric: Normal mood and affect.  Pertinent Imaging: CLINICAL DATA:  Diarrhea.   EXAM: CT ABDOMEN AND PELVIS WITHOUT CONTRAST   TECHNIQUE: Multidetector CT imaging of the abdomen and pelvis was performed following the standard protocol without IV contrast.   RADIATION DOSE REDUCTION: This exam was performed according to the departmental dose-optimization program which includes automated exposure control, adjustment of the mA and/or kV according to patient size and/or use of iterative reconstruction technique.   COMPARISON:  July 08, 2016   FINDINGS: Lower chest: Mild atelectasis is seen within the posterior aspects of the bilateral lung bases.   Hepatobiliary: No focal liver abnormality is seen. Status post cholecystectomy. No biliary dilatation.   Pancreas: Unremarkable. No pancreatic ductal dilatation or surrounding inflammatory changes.   Spleen: Normal in size without focal abnormality.   Adrenals/Urinary Tract: Adrenal glands are unremarkable. Kidneys are normal, without obstructing renal calculi, focal lesion,  or hydronephrosis. A 2 mm nonobstructing renal  calculus is seen within the mid left kidney. The urinary bladder is poorly distended and subsequently limited in evaluation. Bilateral nonspecific perinephric inflammatory fat stranding is noted. Mild diffuse urinary bladder wall thickening is seen.   Stomach/Bowel: There is a large gastric hernia. The appendix is not clearly identified. No evidence of bowel wall thickening, distention, or inflammatory changes. Noninflamed diverticula are seen throughout the sigmoid colon.   Vascular/Lymphatic: Aortic atherosclerosis. No enlarged abdominal or pelvic lymph nodes.   Reproductive: Status post hysterectomy. No adnexal masses.   Other: A 3.2 cm x 1.7 cm x 3.1 cm midline, fat containing ventral hernia is seen within the mid to upper anterior abdominal wall.   No abdominopelvic ascites.   Musculoskeletal: There is a total left hip replacement with associated streak artifact and subsequently limited evaluation of the adjacent osseous and soft tissue structures.   There is marked severity scoliosis of the lumbar spine with marked severity multilevel degenerative changes.   IMPRESSION: 1. Large gastric hernia. 2. Sigmoid diverticulosis. 3. 2 mm nonobstructing left renal calculus. 4. Marked severity scoliosis of the lumbar spine with marked severity multilevel degenerative changes. 5. Aortic atherosclerosis.   Aortic Atherosclerosis (ICD10-I70.0).   Electronically Signed   By: Aram Candela M.D.   On: 07/27/2022 00:45 Personally reviewed scan and agree with radiologic interpretation.   Assessment & Plan:    Bladder cancer  - She's had a cystoscopically proven disease back two years ago and has likely progressed locally. She has no evidence of obvious metastatic disease as of March 2024. She declines further treatment and surgeries as such role of cystoscopy is limited. Would recommend deferring that unless she changes her  mind.  2. Severe OAB  - Urinary frequency likely a result of bladder cancer, fibrosis.  - Chronic, longstanding. Tried and failed multiple drugs outlined above.  - Plan to try Gemtesa. If it's effective, we may have to apply for tier exception given that she's tried and failed most other drugs. Prescription given today.  3. "Recurrent UTI"  - She had one documented infection in March. We discussed some symptoms of infection, specifically due to evidence of a systemic disease.   - Prescribed topical estrogen cream. Explained how to use it correctly. She will let us know on MyChart how these new medicines are working.  - Encouraged to  return if she has symptoms of infection.  Return in about 1 year (around 02/15/2024) for follow-up.  [facility] [address], [address] [place], [place] [address] [phone]

## 2023-03-17 ENCOUNTER — Other Ambulatory Visit: Payer: Self-pay

## 2023-03-17 DIAGNOSIS — R350 Frequency of micturition: Secondary | ICD-10-CM

## 2023-03-17 DIAGNOSIS — N952 Postmenopausal atrophic vaginitis: Secondary | ICD-10-CM

## 2023-03-17 DIAGNOSIS — N3281 Overactive bladder: Secondary | ICD-10-CM

## 2023-03-17 DIAGNOSIS — C678 Malignant neoplasm of overlapping sites of bladder: Secondary | ICD-10-CM

## 2023-03-17 MED ORDER — GEMTESA 75 MG PO TABS
1.0000 | ORAL_TABLET | Freq: Every day | ORAL | 11 refills | Status: DC
Start: 2023-03-17 — End: 2024-02-16

## 2023-04-12 ENCOUNTER — Ambulatory Visit (INDEPENDENT_AMBULATORY_CARE_PROVIDER_SITE_OTHER): Payer: Medicare Other | Admitting: Podiatry

## 2023-04-12 ENCOUNTER — Encounter: Payer: Self-pay | Admitting: Podiatry

## 2023-04-12 VITALS — Ht <= 58 in | Wt 112.0 lb

## 2023-04-12 DIAGNOSIS — B351 Tinea unguium: Secondary | ICD-10-CM | POA: Diagnosis not present

## 2023-04-12 DIAGNOSIS — M79675 Pain in left toe(s): Secondary | ICD-10-CM | POA: Diagnosis not present

## 2023-04-12 DIAGNOSIS — M79674 Pain in right toe(s): Secondary | ICD-10-CM

## 2023-04-12 NOTE — Progress Notes (Signed)
   Chief Complaint  Patient presents with   Nail Problem    Patient is here for [facility]    SUBJECTIVE Patient presents to office today complaining of elongated, thickened nails that cause pain while ambulating in shoes.  Patient is unable to trim their own nails. Patient is here for further evaluation and treatment.  Past Medical History:  Diagnosis Date   Adnexal cyst    Arthritis    Bladder cancer (HCC) 04/2015   Brain aneurysm    small, followed with regular angiograms, Dr. Corliss Skains   Breast cancer [facility]) 2007   DCIS-Right, s/p lumpectomy/xrt   Cerebrovascular disease 2008   s/p PTCA/stent of MCA, Deveshwar   Chronic kidney disease    Complication of anesthesia    2003- trouble waking up after fracture surgery, no issues with anesthesia since   DDD (degenerative disc disease), cervical    Degenerative joint disease    [facility] by [facility]   GERD (gastroesophageal reflux disease)    Headache    History of hiatal hernia    History of MRSA infection    History of TIA (transient ischemic attack)    Hx MRSA infection    Hyperlipidemia    Hypertension    Hypothyroidism    Incomplete bladder emptying    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 Derm Skin is dry and supple bilateral. Negative open lesions or macerations. Remaining integument unremarkable. Nails are tender, long, thickened and dystrophic with subungual debris, consistent with onychomycosis, 1-5 bilateral. No signs of infection noted. Vasc  DP and PT pedal pulses palpable bilaterally. Temperature gradient within normal limits.  Neuro Epicritic and protective threshold sensation grossly  intact bilaterally.  Musculoskeletal Exam No symptomatic pedal deformities noted bilateral. Muscular strength within normal limits.  ASSESSMENT 1.  Pain due to onychomycosis of toenails both  PLAN OF CARE 1. Patient evaluated today.  2. Instructed to maintain good pedal hygiene and foot care.  3. Mechanical debridement of nails 1-5 bilaterally performed using a nail nipper. Filed with dremel without incident.  4. Return to clinic in 3 mos.    Felecia Shelling, DPM [facility]  Dr. Felecia Shelling, DPM    [address] [place], [place] [address]                Office [phone]  Fax [phone]

## 2023-07-12 ENCOUNTER — Encounter: Payer: Self-pay | Admitting: Podiatry

## 2023-07-12 ENCOUNTER — Ambulatory Visit (INDEPENDENT_AMBULATORY_CARE_PROVIDER_SITE_OTHER): Payer: Medicare Other | Admitting: Podiatry

## 2023-07-12 DIAGNOSIS — B351 Tinea unguium: Secondary | ICD-10-CM | POA: Diagnosis not present

## 2023-07-12 DIAGNOSIS — M79674 Pain in right toe(s): Secondary | ICD-10-CM | POA: Diagnosis not present

## 2023-07-12 DIAGNOSIS — M79675 Pain in left toe(s): Secondary | ICD-10-CM | POA: Diagnosis not present

## 2023-07-12 NOTE — Progress Notes (Signed)
   Chief Complaint  Patient presents with   Nail Problem    "Cut my toenails."    SUBJECTIVE Patient presents to office today complaining of elongated, thickened nails that cause pain while ambulating in shoes.  Patient is unable to trim their own nails. Patient is here for further evaluation and treatment.  Past Medical History:  Diagnosis Date   Adnexal cyst    Arthritis    Bladder cancer (HCC) 04/2015   Brain aneurysm    small, followed with regular angiograms, Dr. Corliss Skains   Breast cancer [facility]) 2007   DCIS-Right, s/p lumpectomy/xrt   Cerebrovascular disease 2008   s/p PTCA/stent of MCA, Deveshwar   Chronic kidney disease    Complication of anesthesia    2003- trouble waking up after fracture surgery, no issues with anesthesia since   DDD (degenerative disc disease), cervical    Degenerative joint disease    managed by [facility]   GERD (gastroesophageal reflux disease)    Headache    History of hiatal hernia    History of MRSA infection    History of TIA (transient ischemic attack)    Hx MRSA infection    Hyperlipidemia    Hypertension    Hypothyroidism    Incomplete bladder emptying    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 Derm Skin is dry and supple bilateral. Negative open lesions or macerations. Remaining integument unremarkable. Nails are tender, long, thickened and dystrophic with subungual debris, consistent with onychomycosis, 1-5 bilateral. No signs of infection noted. Vasc  DP and PT pedal pulses palpable bilaterally. Temperature gradient within normal limits.  Neuro Epicritic and protective threshold sensation grossly intact  bilaterally.  Musculoskeletal Exam No symptomatic pedal deformities noted bilateral. Muscular strength within normal limits.  ASSESSMENT 1.  Pain due to onychomycosis of toenails both  PLAN OF CARE 1. Patient evaluated today.  2. Instructed to maintain good pedal hygiene and foot care.  3. Mechanical debridement of nails 1-5 bilaterally performed using a nail nipper. Filed with dremel without incident.  4. Return to clinic in 3 mos.    Felecia Shelling, DPM [facility]  Dr. Felecia Shelling, DPM    [address] [place], [place] [address]                Office [phone]  Fax [phone]

## 2023-10-11 ENCOUNTER — Encounter: Payer: Self-pay | Admitting: Podiatry

## 2023-10-11 ENCOUNTER — Ambulatory Visit (INDEPENDENT_AMBULATORY_CARE_PROVIDER_SITE_OTHER): Payer: Medicare Other | Admitting: Podiatry

## 2023-10-11 DIAGNOSIS — M79675 Pain in left toe(s): Secondary | ICD-10-CM | POA: Diagnosis not present

## 2023-10-11 DIAGNOSIS — B351 Tinea unguium: Secondary | ICD-10-CM | POA: Diagnosis not present

## 2023-10-11 DIAGNOSIS — M79674 Pain in right toe(s): Secondary | ICD-10-CM

## 2023-10-11 NOTE — Progress Notes (Signed)
   No chief complaint on file.   SUBJECTIVE Patient presents to office today complaining of elongated, thickened nails that cause pain while ambulating in shoes.  Patient is unable to trim their own nails. Patient is here for further evaluation and treatment.  Past Medical History:  Diagnosis Date   Adnexal cyst    Arthritis    Bladder cancer ([place]) 04/2015   Brain aneurysm    small, followed with regular angiograms, Dr. Estanislado Pandy   Breast cancer [facility]) 2007   DCIS-Right, s/p lumpectomy/xrt   Cerebrovascular disease 2008   s/p PTCA/stent of MCA, Deveshwar   Chronic kidney disease    Complication of anesthesia    2003- trouble waking up after fracture surgery, no issues with anesthesia since   DDD (degenerative disc disease), cervical    Degenerative joint disease    managed by [facility]   GERD (gastroesophageal reflux disease)    Headache    History of hiatal hernia    History of MRSA infection    History of TIA (transient ischemic attack)    Hx MRSA infection    Hyperlipidemia    Hypertension    Hypothyroidism    Incomplete bladder emptying    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 Derm Skin is dry and supple bilateral. Negative open lesions or macerations. Remaining integument unremarkable. Nails are tender, long, thickened and dystrophic with subungual debris, consistent with onychomycosis, 1-5 bilateral. No signs of infection noted. Vasc  DP and PT pedal pulses palpable bilaterally. Temperature gradient within normal limits.  Neuro Epicritic and protective threshold sensation grossly intact bilaterally.  Musculoskeletal Exam No symptomatic  pedal deformities noted bilateral. Muscular strength within normal limits.  ASSESSMENT 1.  Pain due to onychomycosis of toenails both  PLAN OF CARE 1. Patient evaluated today.  2. Instructed to maintain good pedal hygiene and foot care.  3. Mechanical debridement of nails 1-5 bilaterally performed using a nail nipper. Filed with dremel without incident.  4. Return to clinic in 3 mos.    Edrick Kins, DPM [facility]  Dr. Edrick Kins, DPM    [address], [place] [address]                Office [phone]  Fax [phone]

## 2024-01-09 ENCOUNTER — Ambulatory Visit (INDEPENDENT_AMBULATORY_CARE_PROVIDER_SITE_OTHER): Admitting: Podiatry

## 2024-01-09 ENCOUNTER — Encounter: Payer: Self-pay | Admitting: Podiatry

## 2024-01-09 DIAGNOSIS — M79675 Pain in left toe(s): Secondary | ICD-10-CM | POA: Diagnosis not present

## 2024-01-09 DIAGNOSIS — M79674 Pain in right toe(s): Secondary | ICD-10-CM | POA: Diagnosis not present

## 2024-01-09 DIAGNOSIS — B351 Tinea unguium: Secondary | ICD-10-CM

## 2024-01-12 ENCOUNTER — Encounter: Payer: Self-pay | Admitting: Urology

## 2024-01-15 NOTE — Progress Notes (Signed)
 Subjective:  Patient ID: Jodi Colon, female    DOB: 1929-11-10,  MRN: [id number]  89 y.o. female presents painful thick toenails that are difficult to trim. Pain interferes with ambulation. Aggravating factors include wearing enclosed shoe gear. Pain is relieved with periodic professional debridement.  New problem(s): None   PCP is [name], [name], NP , and last visit was June 06, 2023.  Allergies  Allergen Reactions   Penicillins Rash    Has patient had a PCN reaction causing immediate rash, facial/tongue/throat swelling, SOB or lightheadedness with hypotension: Yes Has patient had a PCN reaction causing severe rash involving mucus membranes or skin necrosis: No Has patient had a PCN reaction that required hospitalization: No Has patient had a PCN reaction occurring within the last 10 years: No If all of the above answers are NO, then may proceed with Cephalosporin use.  Occurred as an adult & full body rash  Rash Rash Occurred as an adult & full body rash Has patient had a PCN reaction causing immediate rash, facial/tongue/throat swelling, SOB or lightheadedness with hypotension: Yes Has patient had a PCN reaction causing severe rash involving mucus membranes or skin necrosis: No Has patient had a PCN reaction that required hospitalization: No Has patient had a PCN reaction occurring within the last 10 years: No If all of the above answers are NO, then may proceed with Cephalosporin use. Has patient had a PCN reaction causing immediate rash, facial/tongue/throat swelling, SOB or lightheadedness with hypotension: Yes Has patient had a PCN reaction causing severe rash involving mucus membranes or skin necrosis: No Has patient had a PCN reaction that required hospitalization: No Has patient had a PCN reaction occurring within the last 10 years: No If all of the above answers are NO, then may proceed with Cephalosporin use.  Occurred as an adult & full body  rash Occurred as an adult & full body rash   Sulfa Antibiotics Swelling and Rash    Facial swelling   Ciprofloxacin  Other (See Comments)   Sulfamethoxazole Rash   Sulfasalazine Swelling and Rash    swelling of face    Review of Systems: Negative except as noted in the HPI.   Objective:  Jodi Colon is a pleasant 89 y.o. female in NAD. AAO x 3.  Vascular Examination: Vascular status intact b/l with palpable pedal pulses. CFT immediate b/l. Pedal hair present. No edema. No pain with calf compression b/l. Skin temperature gradient WNL b/l. No varicosities noted. No cyanosis or clubbing noted.  Neurological Examination: Sensation grossly intact b/l with 10 gram monofilament. Vibratory sensation intact b/l.  Dermatological Examination: Pedal skin with normal turgor, texture and tone b/l. No open wounds nor interdigital macerations noted. Toenails 1-5 b/l thick, discolored, elongated with subungual debris and pain on dorsal palpation. No hyperkeratotic lesions noted b/l.   Musculoskeletal Examination: Muscle strength 5/5 to b/l LE.  No pain, crepitus noted b/l. No gross pedal deformities. Patient ambulates independently without assistive aids.   Radiographs: None Assessment:   1. Pain due to onychomycosis of toenails of both feet    Plan:  Patient was evaluated and treated. All patient's and/or POA's questions/concerns addressed on today's visit. Toenails 1-5 debrided in length and girth without incident. Continue soft, supportive shoe gear daily. Report any pedal injuries to medical professional. Call office if there are any questions/concerns. -Patient/POA to call should there be question/concern in the interim.  Return in about 3 months (around 04/10/2024).  Jodi Colon, DPM  [place] LOCATION: [address], [place] [address]                   Office [phone]   [facility] LOCATION: [address] [place], [place] [address] Office [phone]

## 2024-02-08 NOTE — Assessment & Plan Note (Signed)
 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

## 2024-02-08 NOTE — Progress Notes (Signed)
   02/16/2024 3:20 PM   Jodi Colon 05-Feb-1930 [id number]  Reason for visit: Follow up bladder Ca   HPI: Initial follow up with me today, previously seen by Dr. Penne in Sept 2024 Very pleasant patient, accompanied by her daughter today Ongoing issues with overactive bladder, daytime and nocturnal leakage, wearing depends and pads Tried Gemtesa  although minimally effective and cost prohibitive Denies any major constitutional symptoms, gross hematuria, recent hospitalizations Continues to live by herself  Prior HPI: previously seen by Dr. Penne in Sept 2024 for hx of bladder Ca   Recurrent CIS of bladder (dx 2011 --> progessed to CIS, managed at [facility]) TURBT (2021) - HGTa + multifocal CIS TURBT (2022) - HGTa + CIS Cysto (Sept 2022) - small bladder recurrence - no biopsy or further TURBT (patient decided) Multidiscplinary eval --> expectant management, preserve QoL. Patient declined further TUR or office cystoscopy   Also, hx of refractory chronic UI. Essentially tried and failed all interventions  Uses a Purewick w/ multiple wet pads per day   Physical Exam: BP (!) 128/53   Pulse 80   Ht 4' 9 (1.448 m)   Wt 115 lb (52.2 kg)   BMI 24.89 kg/m    Constitutional:  Alert and oriented, No acute distress.   Laboratory Data: N/A  Pertinent Imaging: CT A/P (2024)-  IMPRESSION: 1. Large gastric hernia. 2. Sigmoid diverticulosis. 3. 2 mm nonobstructing left renal calculus. 4. Marked severity scoliosis of the lumbar spine with marked severity multilevel degenerative changes. 5. Aortic atherosclerosis.   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)  Recurrent HGTa + CIS (last noted in 2022) SDM + multi-D --> patient elected watchful waiting, no further cysto/TUR   Presumed localized but high-risk NMIBC, CT from 2024 with no obvious advanced progression or locoregional spread   Lengthy prior goals of care discussions-- continue status quo, no active intervention    Urge incontinence Assessment & Plan: Chronic, severe + refractory OAB / UUI  Failed all interventions over the years  Purewick + multiple daily pads  Started on Gemtesa  in 2024, minimally effective and cause prohibitive  Unfortunately, I do not have many novel ideas to alleviate her symptoms which are likely secondary to chronic OAB, as well as underlying bladder cancer.  I am happy to offer a trial of another antispasmodic, trospium -which is likely much cheaper.   - Trial of trospium  20 mg twice daily   Other orders -     Trospium  Chloride; Take 1 tablet (20 mg total) by mouth 2 (two) times daily.  Dispense: 180 tablet; Refill: 3       Jodi Colon JONELLE Skye, MD  [facility] [address] [place], [place] [address] [phone]

## 2024-02-08 NOTE — Assessment & Plan Note (Addendum)
 Chronic, severe + refractory OAB / UUI  Failed all interventions over the years  Purewick + multiple daily pads  Started on Gemtesa  in 2024, minimally effective and cause prohibitive  Unfortunately, I do not have many novel ideas to alleviate her symptoms which are likely secondary to chronic OAB, as well as underlying bladder cancer.  I am happy to offer a trial of another antispasmodic, trospium -which is likely much cheaper.   - Trial of trospium  20 mg twice daily

## 2024-02-15 ENCOUNTER — Ambulatory Visit: Payer: Self-pay | Admitting: Urology

## 2024-02-16 ENCOUNTER — Ambulatory Visit: Admitting: Urology

## 2024-02-16 VITALS — BP 128/53 | HR 80 | Ht <= 58 in | Wt 115.0 lb

## 2024-02-16 DIAGNOSIS — C678 Malignant neoplasm of overlapping sites of bladder: Secondary | ICD-10-CM | POA: Diagnosis not present

## 2024-02-16 DIAGNOSIS — N3941 Urge incontinence: Secondary | ICD-10-CM

## 2024-02-16 MED ORDER — TROSPIUM CHLORIDE 20 MG PO TABS: 20.0000 mg | ORAL_TABLET | Freq: Two times a day (BID) | ORAL | 3 refills | Status: AC

## 2024-03-19 ENCOUNTER — Emergency Department

## 2024-03-19 ENCOUNTER — Other Ambulatory Visit: Payer: Self-pay

## 2024-03-19 ENCOUNTER — Inpatient Hospital Stay
Admission: EM | Admit: 2024-03-19 | Discharge: 2024-03-22 | DRG: 565 | Disposition: A | Discharge status: Skilled Nursing Facility | Attending: Internal Medicine | Admitting: Internal Medicine

## 2024-03-19 DIAGNOSIS — Z880 Allergy status to penicillin: Secondary | ICD-10-CM

## 2024-03-19 DIAGNOSIS — Y92008 Other place in unspecified non-institutional (private) residence as the place of occurrence of the external cause: Secondary | ICD-10-CM

## 2024-03-19 DIAGNOSIS — C679 Malignant neoplasm of bladder, unspecified: Secondary | ICD-10-CM | POA: Diagnosis not present

## 2024-03-19 DIAGNOSIS — R54 Age-related physical debility: Secondary | ICD-10-CM | POA: Diagnosis present

## 2024-03-19 DIAGNOSIS — I452 Bifascicular block: Secondary | ICD-10-CM | POA: Diagnosis present

## 2024-03-19 DIAGNOSIS — Z853 Personal history of malignant neoplasm of breast: Secondary | ICD-10-CM | POA: Diagnosis not present

## 2024-03-19 DIAGNOSIS — R296 Repeated falls: Secondary | ICD-10-CM | POA: Diagnosis present

## 2024-03-19 DIAGNOSIS — Z96642 Presence of left artificial hip joint: Secondary | ICD-10-CM | POA: Diagnosis present

## 2024-03-19 DIAGNOSIS — N390 Urinary tract infection, site not specified: Secondary | ICD-10-CM | POA: Diagnosis present

## 2024-03-19 DIAGNOSIS — R410 Disorientation, unspecified: Secondary | ICD-10-CM | POA: Diagnosis present

## 2024-03-19 DIAGNOSIS — G2581 Restless legs syndrome: Secondary | ICD-10-CM | POA: Diagnosis present

## 2024-03-19 DIAGNOSIS — F32A Depression, unspecified: Secondary | ICD-10-CM | POA: Diagnosis present

## 2024-03-19 DIAGNOSIS — Z90710 Acquired absence of both cervix and uterus: Secondary | ICD-10-CM

## 2024-03-19 DIAGNOSIS — Z66 Do not resuscitate: Secondary | ICD-10-CM | POA: Diagnosis present

## 2024-03-19 DIAGNOSIS — Z79890 Hormone replacement therapy (postmenopausal): Secondary | ICD-10-CM | POA: Diagnosis not present

## 2024-03-19 DIAGNOSIS — W19XXXA Unspecified fall, initial encounter: Secondary | ICD-10-CM | POA: Diagnosis not present

## 2024-03-19 DIAGNOSIS — Z8551 Personal history of malignant neoplasm of bladder: Secondary | ICD-10-CM | POA: Diagnosis not present

## 2024-03-19 DIAGNOSIS — K219 Gastro-esophageal reflux disease without esophagitis: Secondary | ICD-10-CM | POA: Diagnosis present

## 2024-03-19 DIAGNOSIS — Y92009 Unspecified place in unspecified non-institutional (private) residence as the place of occurrence of the external cause: Secondary | ICD-10-CM | POA: Diagnosis not present

## 2024-03-19 DIAGNOSIS — I679 Cerebrovascular disease, unspecified: Secondary | ICD-10-CM | POA: Diagnosis not present

## 2024-03-19 DIAGNOSIS — W1830XA Fall on same level, unspecified, initial encounter: Secondary | ICD-10-CM | POA: Diagnosis present

## 2024-03-19 DIAGNOSIS — E785 Hyperlipidemia, unspecified: Secondary | ICD-10-CM | POA: Diagnosis present

## 2024-03-19 DIAGNOSIS — E039 Hypothyroidism, unspecified: Secondary | ICD-10-CM | POA: Diagnosis present

## 2024-03-19 DIAGNOSIS — Z9221 Personal history of antineoplastic chemotherapy: Secondary | ICD-10-CM

## 2024-03-19 DIAGNOSIS — Z8673 Personal history of transient ischemic attack (TIA), and cerebral infarction without residual deficits: Secondary | ICD-10-CM | POA: Diagnosis not present

## 2024-03-19 DIAGNOSIS — E871 Hypo-osmolality and hyponatremia: Secondary | ICD-10-CM | POA: Diagnosis present

## 2024-03-19 DIAGNOSIS — F419 Anxiety disorder, unspecified: Secondary | ICD-10-CM | POA: Diagnosis present

## 2024-03-19 DIAGNOSIS — Z923 Personal history of irradiation: Secondary | ICD-10-CM | POA: Diagnosis not present

## 2024-03-19 DIAGNOSIS — Z882 Allergy status to sulfonamides status: Secondary | ICD-10-CM

## 2024-03-19 DIAGNOSIS — T796XXA Traumatic ischemia of muscle, initial encounter: Principal | ICD-10-CM | POA: Diagnosis present

## 2024-03-19 DIAGNOSIS — M6282 Rhabdomyolysis: Secondary | ICD-10-CM | POA: Diagnosis present

## 2024-03-19 DIAGNOSIS — I10 Essential (primary) hypertension: Secondary | ICD-10-CM | POA: Diagnosis present

## 2024-03-19 DIAGNOSIS — F418 Other specified anxiety disorders: Secondary | ICD-10-CM | POA: Diagnosis present

## 2024-03-19 DIAGNOSIS — E663 Overweight: Secondary | ICD-10-CM | POA: Diagnosis present

## 2024-03-19 DIAGNOSIS — Z79899 Other long term (current) drug therapy: Secondary | ICD-10-CM | POA: Diagnosis not present

## 2024-03-19 DIAGNOSIS — Z955 Presence of coronary angioplasty implant and graft: Secondary | ICD-10-CM

## 2024-03-19 DIAGNOSIS — E876 Hypokalemia: Secondary | ICD-10-CM | POA: Diagnosis present

## 2024-03-19 DIAGNOSIS — Z881 Allergy status to other antibiotic agents status: Secondary | ICD-10-CM

## 2024-03-19 LAB — URINALYSIS, W/ REFLEX TO CULTURE (INFECTION SUSPECTED)
Bilirubin Urine: NEGATIVE
Glucose, UA: NEGATIVE mg/dL
Ketones, ur: NEGATIVE mg/dL
Nitrite: NEGATIVE
Protein, ur: NEGATIVE mg/dL
Specific Gravity, Urine: 1.003 — ABNORMAL LOW (ref 1.005–1.030)
pH: 7 (ref 5.0–8.0)

## 2024-03-19 LAB — SODIUM, URINE, RANDOM: Sodium, Ur: 77 mmol/L

## 2024-03-19 LAB — COMPREHENSIVE METABOLIC PANEL WITH GFR
ALT: 22 U/L (ref 0–44)
AST: 50 U/L — ABNORMAL HIGH (ref 15–41)
Albumin: 3.7 g/dL (ref 3.5–5.0)
Alkaline Phosphatase: 129 U/L — ABNORMAL HIGH (ref 38–126)
Anion gap: 11 (ref 5–15)
BUN: 10 mg/dL (ref 8–23)
CO2: 24 mmol/L (ref 22–32)
Calcium: 9 mg/dL (ref 8.9–10.3)
Chloride: 89 mmol/L — ABNORMAL LOW (ref 98–111)
Creatinine, Ser: 0.82 mg/dL (ref 0.44–1.00)
GFR, Estimated: >60 mL/min (ref >60)
Glucose, Bld: 103 mg/dL — ABNORMAL HIGH (ref 70–99)
Potassium: 4.2 mmol/L (ref 3.5–5.1)
Sodium: 124 mmol/L — ABNORMAL LOW (ref 135–145)
Total Bilirubin: 0.9 mg/dL (ref 0.0–1.2)
Total Protein: 6.8 g/dL (ref 6.5–8.1)

## 2024-03-19 LAB — CK: Total CK: 1540 U/L — ABNORMAL HIGH (ref 38–234)

## 2024-03-19 LAB — CBC
HCT: 38 % (ref 36.0–46.0)
Hemoglobin: 13.2 g/dL (ref 12.0–15.0)
MCH: 29 pg (ref 26.0–34.0)
MCHC: 34.7 g/dL (ref 30.0–36.0)
MCV: 83.5 fL (ref 80.0–100.0)
Platelets: 225 K/uL (ref 150–400)
RBC: 4.55 MIL/uL (ref 3.87–5.11)
RDW: 12.3 % (ref 11.5–15.5)
WBC: 10.1 K/uL (ref 4.0–10.5)
nRBC: 0 % (ref 0.0–0.2)

## 2024-03-19 LAB — OSMOLALITY, URINE: Osmolality, Ur: 203 mosm/kg — ABNORMAL LOW (ref 300–900)

## 2024-03-19 LAB — LACTIC ACID, PLASMA: Lactic Acid, Venous: 1.4 mmol/L (ref 0.5–1.9)

## 2024-03-19 LAB — OSMOLALITY: Osmolality: 256 mosm/kg — ABNORMAL LOW (ref 275–295)

## 2024-03-19 MED ORDER — RISAQUAD PO CAPS
1.0000 | ORAL_CAPSULE | Freq: Every day | ORAL | Status: DC
  Administered 2024-03-20 – 2024-03-22 (×3): 1 via ORAL
  Filled 2024-03-19 (×3): qty 1

## 2024-03-19 MED ORDER — SODIUM CHLORIDE 0.9 % IV BOLUS
1000.0000 mL | Freq: Once | INTRAVENOUS | Status: AC
  Administered 2024-03-19: 1000 mL via INTRAVENOUS

## 2024-03-19 MED ORDER — ENOXAPARIN SODIUM 40 MG/0.4ML IJ SOSY
40.0000 mg | PREFILLED_SYRINGE | INTRAMUSCULAR | Status: DC
  Administered 2024-03-20 – 2024-03-22 (×3): 40 mg via SUBCUTANEOUS
  Filled 2024-03-19 (×3): qty 0.4

## 2024-03-19 MED ORDER — SODIUM CHLORIDE 0.9 % IV SOLN
1.0000 g | Freq: Once | INTRAVENOUS | Status: AC
  Administered 2024-03-20: 1 g via INTRAVENOUS
  Filled 2024-03-19: qty 10

## 2024-03-19 MED ORDER — SODIUM CHLORIDE 1 G PO TABS
1.0000 g | ORAL_TABLET | Freq: Two times a day (BID) | ORAL | Status: DC
  Administered 2024-03-19 – 2024-03-22 (×6): 1 g via ORAL
  Filled 2024-03-19 (×6): qty 1

## 2024-03-19 MED ORDER — HYDRALAZINE HCL 20 MG/ML IJ SOLN: 5.0000 mg | INTRAMUSCULAR | Status: DC | PRN

## 2024-03-19 MED ORDER — HYDRALAZINE HCL 50 MG PO TABS
50.0000 mg | ORAL_TABLET | Freq: Three times a day (TID) | ORAL | Status: DC
  Administered 2024-03-19 – 2024-03-22 (×5): 50 mg via ORAL
  Filled 2024-03-19 (×8): qty 1

## 2024-03-19 MED ORDER — FAMOTIDINE 20 MG PO TABS
40.0000 mg | ORAL_TABLET | Freq: Every day | ORAL | Status: DC
  Administered 2024-03-20 – 2024-03-21 (×2): 40 mg via ORAL
  Filled 2024-03-19 (×2): qty 2

## 2024-03-19 MED ORDER — LEVOTHYROXINE SODIUM 50 MCG PO TABS
50.0000 ug | ORAL_TABLET | Freq: Every day | ORAL | Status: DC
  Administered 2024-03-20: 50 ug via ORAL
  Filled 2024-03-19: qty 1

## 2024-03-19 MED ORDER — ACETAMINOPHEN 325 MG PO TABS: 650.0000 mg | ORAL_TABLET | Freq: Four times a day (QID) | ORAL | Status: DC | PRN

## 2024-03-19 MED ORDER — FAMOTIDINE 20 MG PO TABS: 40.0000 mg | ORAL_TABLET | Freq: Every day | ORAL | Status: DC

## 2024-03-19 MED ORDER — ADULT MULTIVITAMIN W/MINERALS CH
1.0000 | ORAL_TABLET | Freq: Every day | ORAL | Status: DC
  Administered 2024-03-20 – 2024-03-22 (×3): 1 via ORAL
  Filled 2024-03-19 (×3): qty 1

## 2024-03-19 MED ORDER — FESOTERODINE FUMARATE ER 4 MG PO TB24
4.0000 mg | ORAL_TABLET | Freq: Every day | ORAL | Status: DC
  Administered 2024-03-20 – 2024-03-22 (×3): 4 mg via ORAL
  Filled 2024-03-19 (×3): qty 1

## 2024-03-19 MED ORDER — SODIUM CHLORIDE 0.9 % IV SOLN: INTRAVENOUS | Status: DC

## 2024-03-19 MED ORDER — LACTATED RINGERS IV SOLN: INTRAVENOUS | Status: DC

## 2024-03-19 MED ORDER — SODIUM CHLORIDE 0.9 % IV SOLN
1.0000 g | INTRAVENOUS | Status: DC
  Administered 2024-03-20 – 2024-03-22 (×3): 1 g via INTRAVENOUS
  Filled 2024-03-19 (×3): qty 10

## 2024-03-19 MED ORDER — MORPHINE SULFATE (PF) 2 MG/ML IV SOLN
2.0000 mg | Freq: Once | INTRAVENOUS | Status: AC
  Administered 2024-03-19: 2 mg via INTRAVENOUS
  Filled 2024-03-19: qty 1

## 2024-03-19 MED ORDER — ONDANSETRON HCL 4 MG/2ML IJ SOLN
4.0000 mg | Freq: Three times a day (TID) | INTRAMUSCULAR | Status: DC | PRN
  Administered 2024-03-20: 4 mg via INTRAVENOUS
  Filled 2024-03-19: qty 2

## 2024-03-19 MED ORDER — ATORVASTATIN CALCIUM 10 MG PO TABS
10.0000 mg | ORAL_TABLET | Freq: Every day | ORAL | Status: DC
  Administered 2024-03-20 – 2024-03-22 (×3): 10 mg via ORAL
  Filled 2024-03-19 (×3): qty 1

## 2024-03-19 MED ORDER — VENLAFAXINE HCL 37.5 MG PO TABS
75.0000 mg | ORAL_TABLET | Freq: Every day | ORAL | Status: DC
  Administered 2024-03-20 – 2024-03-22 (×3): 75 mg via ORAL
  Filled 2024-03-19 (×3): qty 2

## 2024-03-19 MED ORDER — LORATADINE 10 MG PO TABS
10.0000 mg | ORAL_TABLET | Freq: Every day | ORAL | Status: DC
  Administered 2024-03-20 – 2024-03-22 (×3): 10 mg via ORAL
  Filled 2024-03-19 (×3): qty 1

## 2024-03-19 MED ORDER — HYDROXYZINE HCL 25 MG PO TABS
25.0000 mg | ORAL_TABLET | Freq: Every day | ORAL | Status: DC
  Administered 2024-03-20 – 2024-03-22 (×3): 25 mg via ORAL
  Filled 2024-03-19 (×3): qty 1

## 2024-03-19 MED ORDER — PANTOPRAZOLE SODIUM 40 MG PO TBEC
40.0000 mg | DELAYED_RELEASE_TABLET | Freq: Two times a day (BID) | ORAL | Status: DC
  Administered 2024-03-19 – 2024-03-22 (×6): 40 mg via ORAL
  Filled 2024-03-19 (×6): qty 1

## 2024-03-19 MED ORDER — LACTATED RINGERS IV BOLUS
500.0000 mL | Freq: Once | INTRAVENOUS | Status: AC
  Administered 2024-03-20: 500 mL via INTRAVENOUS

## 2024-03-19 MED ORDER — SODIUM CHLORIDE 0.9 % IV BOLUS: 500.0000 mL | Freq: Once | INTRAVENOUS | Status: DC

## 2024-03-19 MED ORDER — ROPINIROLE HCL 1 MG PO TABS
3.0000 mg | ORAL_TABLET | Freq: Every day | ORAL | Status: DC
  Administered 2024-03-19 – 2024-03-21 (×3): 3 mg via ORAL
  Filled 2024-03-19 (×4): qty 3

## 2024-03-19 NOTE — ED Provider Notes (Signed)
 Went to evaluate, patient currently in imaging/radiology department.   Dicky Anes, MD 03/19/24 [phone]

## 2024-03-19 NOTE — ED Provider Notes (Signed)
 [facility] Provider Note    Event Date/Time   First MD Initiated Contact with Patient 03/19/24 1832     (approximate)   History   Fall (Multiple falls. Generalized weakness)   HPI  Jodi Colon is a 89 y.o. female past medical history significant for bladder cancer, hypertension, hypothyroidism, who presents to the emergency department following multiple falls.  History is provided by the patient and daughter at bedside.  States that she had a fall last week and then has had continual falls since that time.  Ambulates with a walker has been feeling dizzy and like she needs to fall backwards.  Does endorse 1 episode of head injury last week.  States that she feels dehydrated and gets dehydrated easily.  History of recurrent urinary tract infections.  Was last seen last night with her other daughter when she stayed with her at home.  This morning whenever she went to check on her around 3 PM stated that she had fallen and bit on the ground since either 1:30 in the morning or afternoon and she was not sure which one.  Denies any dysuria.  No chest pain or shortness of breath.  States that she was started on a new bladder medication but she does not recall which one.      Physical Exam   Triage Vital Signs: ED Triage Vitals  Encounter Vitals Group     BP 03/19/24 1754 (!) 183/85     Girls Systolic BP Percentile --      Girls Diastolic BP Percentile --      Boys Systolic BP Percentile --      Boys Diastolic BP Percentile --      Pulse Rate 03/19/24 1754 95     Resp 03/19/24 1754 (!) 21     Temp --      Temp src --      SpO2 03/19/24 1754 100 %     Weight 03/19/24 1756 116 lb (52.6 kg)     Height 03/19/24 1756 4' 9 (1.448 m)     Head Circumference --      Peak Flow --      Pain Score 03/19/24 1755 9     Pain Loc --      Pain Education --      Exclude from Growth Chart --     Most recent vital signs: Vitals:   03/19/24 1754 03/19/24 1942  BP: (!)  183/85   Pulse: 95   Resp: (!) 21   Temp:  98 F (36.7 C)  SpO2: 100%     Physical Exam Constitutional:      Appearance: She is well-developed.     Comments: Appears restless  HENT:     Head:     Comments: Ecchymosis to the right forehead Eyes:     Conjunctiva/sclera: Conjunctivae normal.  Neck:     Comments: Mild paraspinal muscle tenderness to palpation Cardiovascular:     Rate and Rhythm: Regular rhythm. Tachycardia present.  Pulmonary:     Effort: No respiratory distress.  Abdominal:     General: There is no distension.     Palpations: Abdomen is soft.     Tenderness: There is no abdominal tenderness.  Musculoskeletal:        General: Normal range of motion.     Cervical back: Normal range of motion. No tenderness.     Comments: No tenderness to palpation to bilateral upper extremities.  Mild tenderness  palpation to the right hip.  Able to range both legs without significant pain.  No tenderness palpation to bilateral lower extremities.  Skin:    General: Skin is warm.     Capillary Refill: Capillary refill takes 2 to 3 seconds.  Neurological:     Mental Status: She is alert. Mental status is at baseline.  Psychiatric:        Mood and Affect: Mood normal.     IMPRESSION / MDM / ASSESSMENT AND PLAN / ED COURSE  I reviewed the triage vital signs and the nursing notes.  Differential diagnosis including intracranial hemorrhage, dehydration, urinary tract infection, rhabdomyolysis, electrolyte abnormality   EKG  I, Clotilda Punter, the attending physician, personally viewed and interpreted this ECG.  EKG showed sinus tachycardia, significant artifact, no significant ST elevation or depression.  No findings of acute ischemia.  Sinus tachycardia while on cardiac telemetry.  RADIOLOGY I independently reviewed imaging, my interpretation of imaging: CT scan of the head without signs of intracranial hemorrhage  CT scan cervical spine read as no acute findings  X-ray  of the left hip read as no acute findings.  Osteopenia.  Osteoarthritis.  Of the right hip  LABS (all labs ordered are listed, but only abnormal results are displayed) Labs interpreted as -    Labs Reviewed  COMPREHENSIVE METABOLIC PANEL WITH GFR - Abnormal; Notable for the following components:      Result Value   Sodium 124 (*)    Chloride 89 (*)    Glucose, Bld 103 (*)    AST 50 (*)    Alkaline Phosphatase 129 (*)    All other components within normal limits  CK - Abnormal; Notable for the following components:   Total CK 1,540 (*)    All other components within normal limits  URINALYSIS, W/ REFLEX TO CULTURE (INFECTION SUSPECTED) - Abnormal; Notable for the following components:   Color, Urine COLORLESS (*)    APPearance CLEAR (*)    Specific Gravity, Urine 1.003 (*)    Hgb urine dipstick MODERATE (*)    Leukocytes,Ua MODERATE (*)    Bacteria, UA RARE (*)    All other components within normal limits  URINE CULTURE  CBC  LACTIC ACID, PLASMA  CK  CBG MONITORING, ED     MDM  Patient found to have significant hyponatremia with a sodium of 124.  Likely in the setting of dehydration, appears volume down.  Given 1 L of normal saline.  UA with WBCs and rare bacteria, low suspicion for urinary tract infection.  CK significantly elevated in the 1000's.  Concern for rhabdomyolysis.  CT scan of the head and cervical spine with no acute fracture or signs of internal injury.  X-ray of the hip with no acute fracture.  Have a low suspicion for an occult fracture, able to range both legs with no significant tenderness to palpation.  Consulted the hospitalist for hyponatremia, rhabdomyolysis and dehydration     PROCEDURES:  Critical Care performed: yes  .Critical Care  Performed by: Punter Clotilda, MD Authorized by: Punter Clotilda, MD   Critical care provider statement:    Critical care time (minutes):  30   Critical care time was exclusive of:  Separately billable  procedures and treating other patients   Critical care was necessary to treat or prevent imminent or life-threatening deterioration of the following conditions:  Endocrine crisis   Critical care was time spent personally by me on the following activities:  Development of treatment  plan with patient or surrogate, discussions with consultants, evaluation of patient's response to treatment, examination of patient, ordering and review of laboratory studies, ordering and review of radiographic studies, ordering and performing treatments and interventions, pulse oximetry, re-evaluation of patient's condition and review of old charts   Care discussed with: admitting provider     Patient's presentation is most consistent with acute presentation with potential threat to life or bodily function.   MEDICATIONS ORDERED IN ED: Medications  cefTRIAXone  (ROCEPHIN ) 1 g in sodium chloride  0.9 % 100 mL IVPB (has no administration in time range)  lactated ringers  infusion (has no administration in time range)  sodium chloride  0.9 % bolus 1,000 mL (1,000 mLs Intravenous New Bag/Given 03/19/24 1937)  morphine  (PF) 2 MG/ML injection 2 mg (2 mg Intravenous Given 03/19/24 1937)    FINAL CLINICAL IMPRESSION(S) / ED DIAGNOSES   Final diagnoses:  Fall, initial encounter  Hyponatremia  Traumatic rhabdomyolysis, initial encounter     Rx / DC Orders   ED Discharge Orders     None        Note:  This document was prepared using Dragon voice recognition software and may include unintentional dictation errors.   Suzanne Kirsch, MD 03/19/24 2207

## 2024-03-19 NOTE — ED Notes (Signed)
Report to Allison RN.

## 2024-03-19 NOTE — H&P (Signed)
 History and Physical    Jodi Colon FMW:[id number] DOB: 07-14-1929 DOA: 03/19/2024  Referring MD/NP/PA:   PCP: Jodi [name], NP   Patient coming from:  The patient is coming from home.     Chief Complaint: Weakness, multiple fall, dysuria,  HPI: Jodi Colon is a 89 y.o. female with medical history significant of bladder cancer diagnosed with high-grade TA TCC of the bladder in 2000 (s/p of BCG and intra bladder chemotherapy), h/o CVA, h/o subarachnoid hemorrhage/brain aneurysm (followed by Neurosurgery), brain aneurysm, CVD s/p PTCA R MCA,  HTN,  HLD, anemia, cataract, GERD, osteopenia, HLD, RLS, spinal stenosis, thyroid  disease, h/o urinary incontinence, depression with anxiety, hyponatremia, breast cancer (s/p of right lumpectomy and radiation therapy), who presents with weakness, multiple falls, dysuria.  Per patient's and her daughter at bedside, she has generalized weakness recently.  She has had multiple falls recently.  She fell again last night, no LOC.  No unilateral numbness or tingling in extremities.  Per daughter, patient was found on the floor at about 15:00 PM today. Pt stated that she had been on the ground since either 1:30 in the morning or afternoon and she was not sure which one.  Patient complains of right hip pain.  Denies head or neck injury.  Patient does not have chest pain, cough, SOB.  No nausea, vomiting, diarrhea or abdominal pain.  Patient reports dysuria, burning on urination and urinary frequency.  No hematuria.  No fever or chills. When I saw patient in ED, her mental status is at baseline.  She is oriented x 3.   Data reviewed independently and ED Course: pt was found to have CKD 1540, UA (clear appearance, moderate amount of leukocyte, rare bacteria, WBC 21-50), WBC 10.1, GFR> 60, sodium 124, lactic acid 1.4.  CT of head and CT of C-spine negative for acute injury.  X-ray of right hip negative for bony fracture.  Temperature normal, blood pressure  183/85, heart rate 90-100s, RR 21, oxygen saturation 100% on room air.  Patient is admitted to telemetry bed as inpatient.   EKG: I have personally reviewed.  Sinus rhythm, QTc 497, bifascicular block, poor R wave progression, PAC.   Review of Systems:   General: no fevers, chills, no body weight gain, has fatigue HEENT: no blurry vision, hearing changes or sore throat Respiratory: no dyspnea, coughing, wheezing CV: no chest pain, no palpitations GI: no nausea, vomiting, abdominal pain, diarrhea, constipation GU: has dysuria, burning on urination, increased urinary frequency, no hematuria  Ext: no leg edema Neuro: no unilateral weakness, numbness, or tingling, no vision change or hearing loss. Has fall. Skin: no rash, no skin tear. MSK: No muscle spasm, no deformity, no limitation of range of movement in spin Heme: No easy bruising.  Travel history: No recent long distant travel.   Allergy:  Allergies  Allergen Reactions   Penicillins Rash    Has patient had a PCN reaction causing immediate rash, facial/tongue/throat swelling, SOB or lightheadedness with hypotension: Yes Has patient had a PCN reaction causing severe rash involving mucus membranes or skin necrosis: No Has patient had a PCN reaction that required hospitalization: No Has patient had a PCN reaction occurring within the last 10 years: No If all of the above answers are NO, then may proceed with Cephalosporin use.  Occurred as an adult & full body rash  Rash Rash Occurred as an adult & full body rash Has patient had a PCN reaction causing immediate rash, facial/tongue/throat swelling, SOB  or lightheadedness with hypotension: Yes Has patient had a PCN reaction causing severe rash involving mucus membranes or skin necrosis: No Has patient had a PCN reaction that required hospitalization: No Has patient had a PCN reaction occurring within the last 10 years: No If all of the above answers are NO, then may proceed  with Cephalosporin use. Has patient had a PCN reaction causing immediate rash, facial/tongue/throat swelling, SOB or lightheadedness with hypotension: Yes Has patient had a PCN reaction causing severe rash involving mucus membranes or skin necrosis: No Has patient had a PCN reaction that required hospitalization: No Has patient had a PCN reaction occurring within the last 10 years: No If all of the above answers are NO, then may proceed with Cephalosporin use.  Occurred as an adult & full body rash Occurred as an adult & full body rash   Sulfa Antibiotics Swelling and Rash    Facial swelling   Ciprofloxacin  Other (See Comments)   Sulfamethoxazole Rash   Sulfasalazine Swelling and Rash    swelling of face    Past Medical History:  Diagnosis Date   Adnexal cyst    Arthritis    Bladder cancer (HCC) 04/2015   Brain aneurysm    small, followed with regular angiograms, Dr. Dolphus   Breast cancer [facility]) 2007   DCIS-Right, s/p lumpectomy/xrt   Cerebrovascular disease 2008   s/p PTCA/stent of MCA, Deveshwar   Chronic kidney disease    Complication of anesthesia    2003- trouble waking up after fracture surgery, no issues with anesthesia since   DDD (degenerative disc disease), cervical    Degenerative joint disease    [facility]   GERD (gastroesophageal reflux disease)    Headache    History of hiatal hernia    History of MRSA infection    History of TIA (transient ischemic attack)    Hx MRSA infection    Hyperlipidemia    Hypertension    Hypothyroidism    Incomplete bladder emptying    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)    MRSA (methicillin resistant Staphylococcus aureus) 2008   perineal abscess   Nocturia    Pneumonia    Radiation 2007   BREAST CA   Spinal stenosis    Stroke [facility])    Thyroid  disease    Urge incontinence     Past Surgical History:  Procedure Laterality Date   ABDOMINAL HYSTERECTOMY     AQUEOUS  SHUNT Left 01/05/2016   Procedure: AQUEOUS SHUNT;  Surgeon: Donzell Arlyce Budd, MD;  Location: [facility];  Service: Ophthalmology;  Laterality: Left;  AHMED VALVE WITH SCLERAL PATCH GRAFT   AQUEOUS SHUNT Right 07/19/2016   Procedure: AQUEOUS SHUNT;  Surgeon: Donzell Arlyce Budd, MD;  Location: [facility];  Service: Ophthalmology;  Laterality: Right;  Ahmed tube shunt   BACK SURGERY     BLADDER SURGERY  2012, 2014   BRAIN SURGERY Right 2008   stent, [place]olynn Pack, [place]   BREAST LUMPECTOMY Right 2007   DCIS, Lumpectomy F/U radiation    CARPAL TUNNEL RELEASE Left 07/2017   CATARACT EXTRACTION     CHOLECYSTECTOMY     COLONOSCOPY  2013   CYSTOSCOPY W/ RETROGRADES Bilateral 05/20/2015   Procedure: CYSTOSCOPY WITH RETROGRADE PYELOGRAM;  Surgeon: Rosina Riis, MD;  Location: [facility];  Service: Urology;  Laterality: Bilateral;   CYSTOSCOPY W/ RETROGRADES Bilateral 12/24/2015   Procedure: CYSTOSCOPY WITH RETROGRADE PYELOGRAM;  Surgeon: Rosina Riis, MD;  Location: [facility];  Service: Urology;  Laterality: Bilateral;   CYSTOSCOPY WITH BIOPSY N/A 05/20/2015   Procedure: CYSTOSCOPY WITH BIOPSY;  Surgeon: Rosina Riis, MD;  Location: [facility];  Service: Urology;  Laterality: N/A;   CYSTOSCOPY WITH BIOPSY N/A 12/24/2015   Procedure: CYSTOSCOPY WITH BIOPSY;  Surgeon: Rosina Riis, MD;  Location: [facility];  Service: Urology;  Laterality: N/A;   EYE SURGERY Bilateral    Cataract Extraction, glaucoma surgery 2018, 2019   FRACTURE SURGERY Left    Ankle, compound fracture   HEMORRHOID SURGERY  02/01/13   LAPAROSCOPIC HYSTERECTOMY     TONSILLECTOMY     TOTAL HIP ARTHROPLASTY Left 11/08/2017   Procedure: TOTAL HIP ARTHROPLASTY ANTERIOR APPROACH;  Surgeon: Kathlynn Sharper, MD;  Location: [facility];  Service: Orthopedics;  Laterality: Left;    Social History:  reports that she has never smoked. She has never been exposed to tobacco smoke. She has never used smokeless tobacco.  She reports that she does not drink alcohol and does not use drugs.  Family History:  Family History  Problem Relation Age of Onset   Fibromyalgia Daughter    Breast cancer Neg Hx      Prior to Admission medications   Medication Sig Start Date End Date Taking? Authorizing Provider  atorvastatin  (LIPITOR ) 10 MG tablet Take 10 mg by mouth every morning.     [provider]  estradiol  (ESTRACE ) 0.1 MG/GM vaginal cream Estrogen Cream Instruction Discard applicator Apply pea sized amount to tip of finger to urethra before bed. Wash hands well after application. Use Monday, Wednesday and Friday 02/15/23   Riis Rosina, MD  famotidine (PEPCID) 40 MG tablet Take 40 mg by mouth daily. 02/15/24   [provider]  fluticasone  (FLONASE ) 50 MCG/ACT nasal spray Place 1 spray into both nostrils daily as needed for allergies or rhinitis.    [provider]  hydrALAZINE  (APRESOLINE ) 50 MG tablet Take 1 tablet (50 mg total) by mouth every 6 (six) hours as needed (SBP >150). 07/31/22   Von Bellis, MD  hydrOXYzine  (ATARAX ) 10 MG tablet Take 10 mg by mouth every 8 (eight) hours as needed. 04/26/22   [provider]  levothyroxine  (SYNTHROID , LEVOTHROID) 50 MCG tablet Take 50 mcg by mouth daily before breakfast. 11/23/13   [provider]  loratadine  (CLARITIN ) 10 MG tablet Take 10 mg by mouth daily.    [provider]  metoprolol  tartrate (LOPRESSOR ) 50 MG tablet Take 1 tablet (50 mg total) by mouth 2 (two) times daily. Patient not taking: Reported on 02/16/2024 07/31/22   Von Bellis, MD  Multiple Vitamin (MULTIVITAMIN) tablet Take 1 tablet by mouth daily. Patient not taking: Reported on 02/16/2024    [provider]  pantoprazole  (PROTONIX ) 40 MG tablet Take 40 mg by mouth 2 (two) times daily.  06/19/13   [provider]  potassium chloride  (KLOR-CON  M) 10 MEQ tablet Take 1 tablet (10 mEq total) by mouth 2 (two) times daily for 3  days. Patient not taking: Reported on 02/16/2024 07/31/22 08/03/22  Von Bellis, MD  Probiotic Product (PROBIOTIC PO) Take 1 capsule by mouth daily.    [provider]  rOPINIRole  (REQUIP ) 0.25 MG tablet Take 1 tablet (0.25 mg total) by mouth 2 (two) times daily. 07/31/22   Von Bellis, MD  trospium  (SANCTURA ) 20 MG tablet Take 1 tablet (20 mg total) by mouth 2 (two) times daily. 02/16/24   Georganne Riis SAUNDERS, MD  venlafaxine  (EFFEXOR ) 75 MG tablet Take 75  mg by mouth daily. 01/27/22   [provider]  Vitamin D , Ergocalciferol , (DRISDOL ) 1.25 MG (50000 UNIT) CAPS capsule Take 1 capsule (50,000 Units total) by mouth every 7 (seven) days. 08/04/22   Von Bellis, MD    Physical Exam: Vitals:   03/19/24 1754 03/19/24 1756 03/19/24 1942 03/19/24 2235  BP: (!) 183/85   (!) 172/65  Pulse: 95   (!) 106  Resp: (!) 21   17  Temp:   98 F (36.7 C) 98.3 F (36.8 C)  TempSrc:   Oral Oral  SpO2: 100%   95%  Weight:  52.6 kg    Height:  4' 9 (1.448 m)     General: Not in acute distress.  HEENT:       Eyes: PERRL, EOMI, no jaundice       ENT: No discharge from the ears and nose, no pharynx injection, no tonsillar enlargement.        Neck: No JVD, no bruit, no mass felt. Heme: No neck lymph node enlargement. Cardiac: S1/S2, RRR, No gallops or rubs. Respiratory: No rales, wheezing, rhonchi or rubs. GI: Soft, nondistended, nontender, no rebound pain, no organomegaly, BS present. GU: No hematuria Ext: No pitting leg edema bilaterally. 1+DP/PT pulse bilaterally. Musculoskeletal: No joint deformities, No joint redness or warmth, no limitation of ROM in spin. Skin: No rashes.  Neuro: Alert, oriented X3, cranial nerves II-XII grossly intact, moves all extremities. Psych: Patient is not psychotic, no suicidal or hemocidal ideation.  Labs on Admission: I have personally reviewed following labs and imaging studies  CBC: Recent Labs  Lab 03/19/24 1811  WBC 10.1  HGB 13.2  HCT 38.0   MCV 83.5  PLT 225   Basic Metabolic Panel: Recent Labs  Lab 03/19/24 1811  NA 124*  K 4.2  CL 89*  CO2 24  GLUCOSE 103*  BUN 10  CREATININE 0.82  CALCIUM  9.0   GFR: Estimated Creatinine Clearance: 31.2 mL/min (by C-G formula based on SCr of 0.82 mg/dL). Liver Function Tests: Recent Labs  Lab 03/19/24 1811  AST 50*  ALT 22  ALKPHOS 129*  BILITOT 0.9  PROT 6.8  ALBUMIN 3.7   No results for input(s): LIPASE, AMYLASE in the last 168 hours. No results for input(s): AMMONIA in the last 168 hours. Coagulation Profile: No results for input(s): INR, PROTIME in the last 168 hours. Cardiac Enzymes: Recent Labs  Lab 03/19/24 1811  CKTOTAL 1,540*   BNP (last 3 results) No results for input(s): PROBNP in the last 8760 hours. HbA1C: No results for input(s): HGBA1C in the last 72 hours. CBG: No results for input(s): GLUCAP in the last 168 hours. Lipid Profile: No results for input(s): CHOL, HDL, LDLCALC, TRIG, CHOLHDL, LDLDIRECT in the last 72 hours. Thyroid  Function Tests: No results for input(s): TSH, T4TOTAL, FREET4, T3FREE, THYROIDAB in the last 72 hours. Anemia Panel: No results for input(s): VITAMINB12, FOLATE, FERRITIN, TIBC, IRON , RETICCTPCT in the last 72 hours. Urine analysis:    Component Value Date/Time   COLORURINE COLORLESS (A) 03/19/2024 2035   APPEARANCEUR CLEAR (A) 03/19/2024 2035   APPEARANCEUR Clear 02/15/2023 1430   LABSPEC 1.003 (L) 03/19/2024 2035   LABSPEC 1.017 09/19/2013 2127   PHURINE 7.0 03/19/2024 2035   GLUCOSEU NEGATIVE 03/19/2024 2035   GLUCOSEU Negative 09/19/2013 2127   HGBUR MODERATE (A) 03/19/2024 2035   BILIRUBINUR NEGATIVE 03/19/2024 2035   BILIRUBINUR Negative 02/15/2023 1430   BILIRUBINUR Negative 09/19/2013 2127   KETONESUR NEGATIVE 03/19/2024 2035  PROTEINUR NEGATIVE 03/19/2024 2035   UROBILINOGEN 0.2 02/12/2015 0134   NITRITE NEGATIVE 03/19/2024 2035   LEUKOCYTESUR  MODERATE (A) 03/19/2024 2035   LEUKOCYTESUR Negative 09/19/2013 2127   Sepsis Labs: @LABRCNTIP (procalcitonin:4,lacticidven:4) )No results found for this or any previous visit (from the past 240 hours).   Radiological Exams on Admission:   Assessment/Plan Principal Problem:   Rhabdomyolysis Active Problems:   Hyponatremia   Complicated UTI (urinary tract infection)   Bladder cancer (HCC)   Fall at home, initial encounter   Hyperlipidemia with target low density lipoprotein (LDL) cholesterol less than 100 mg/dL   Hypothyroidism   Cerebrovascular disease   Essential hypertension   Depression with anxiety   RLS (restless legs syndrome)   Overweight (BMI 25.0-29.9)   Assessment and Plan:   Rhabdomyolysis: CK 1540. GFR> 60. LFT OK.  -will admit to tele bed as inpt -IVF: 500 cc of LR, 1L of NS, then 75 cc/h of LR -check CK daily  Hyponatremia: Na 124.  Mental status okay. Likely due to oral intake and dehydration. - Will check urine sodium, urine osmolality, serum osmolality. - check TSH - Fluid restriction - IVF: as above - Sodium chloride  tablet 1 g twice daily - f/u by BMP q8h - avoid over correction too fast due to risk of central pontine myelinolysis - check magnesium  and phosphorus level  Possible complicated UTI (urinary tract infection): UA is positive.  Patient has typical symptoms of UTI, but patient has history of bladder cancer, her symptoms are at least partially from history of bladder cancer. - Started Rocephin  - Follow-up urine culture --> may discontinue antibiotics if culture negative.  History of bladder cancer [facility]): Currently not on chemotherapy. -Switch home trospium  to [place] in hospital -f/u with urology  Fall at home, initial encounter: Imaging negative for acute injury. - PT/OT - Fall precaution - Consult TOC for home health need  Hyperlipidemia with target low density lipoprotein (LDL) cholesterol less than 100  mg/dL -Lipitor   Hypothyroidism -Synthroid  50 mcg daily - Follow-up TSH  Cerebrovascular disease -Lipitor   Essential hypertension: Blood pressure 183/85.  Patient is taking oral as needed hydralazine  at home -IV hydralazine  as needed - Will schedule oral hydralazine  50 mg 3 times daily  Depression with anxiety -Continue Effexor  and hydroxyzine  as needed  RLS (restless legs syndrome) -Requip   Overweight (BMI 25.0-29.9): Body weight 52.6 kg, BMI 25.10 - Encourage losing weight - Exercise healthy diet       DVT ppx: SQ Lovenox   Code Status: DNR per pt and her daughter  Family Communication:     Yes, patient's daughter at bed side.        Disposition Plan:  Anticipate discharge back to previous environment  Consults called: None  Admission status and Level of care: Telemetry Medical:  as inpt        Dispo: The patient is from: Home              Anticipated d/c is to: Home              Anticipated d/c date is: 2 days              Patient currently is not medically stable to d/c.    Severity of Illness:  The appropriate patient status for this patient is INPATIENT. Inpatient status is judged to be reasonable and necessary in order to provide the required intensity of service to ensure the patient's safety. The patient's presenting symptoms, physical exam findings, and initial radiographic  and laboratory data in the context of their chronic comorbidities is felt to place them at high risk for further clinical deterioration. Furthermore, it is not anticipated that the patient will be medically stable for discharge from the hospital within 2 midnights of admission.   * I certify that at the point of admission it is my clinical judgment that the patient will require inpatient hospital care spanning beyond 2 midnights from the point of admission due to high intensity of service, high risk for further deterioration and high frequency of surveillance  required.*       Date of Service 03/19/2024    Caleb Exon [facility]   If 7PM-7AM, please contact night-coverage [email or web address] 03/19/2024, 10:52 PM

## 2024-03-19 NOTE — ED Triage Notes (Signed)
 Pt arrives to ED from home via [facility] with c/c of generalized weakness and multiple falls. EMS reports fall on Thursday and last night. Pt found today on floor at 1515. EMS reports transport vitals of 132/52, p 94, r18, O2 Sat 99%. Pt complaining of upper left leg pain. Denies hitting head or loss of consciousness. At time of triage Pt A&Ox4, NAD.

## 2024-03-20 DIAGNOSIS — T796XXA Traumatic ischemia of muscle, initial encounter: Secondary | ICD-10-CM | POA: Diagnosis not present

## 2024-03-20 DIAGNOSIS — E871 Hypo-osmolality and hyponatremia: Secondary | ICD-10-CM | POA: Diagnosis not present

## 2024-03-20 DIAGNOSIS — N390 Urinary tract infection, site not specified: Secondary | ICD-10-CM | POA: Diagnosis not present

## 2024-03-20 DIAGNOSIS — W19XXXA Unspecified fall, initial encounter: Secondary | ICD-10-CM | POA: Diagnosis not present

## 2024-03-20 LAB — CBC
HCT: 36.2 % (ref 36.0–46.0)
Hemoglobin: 12.1 g/dL (ref 12.0–15.0)
MCH: 28.9 pg (ref 26.0–34.0)
MCHC: 33.4 g/dL (ref 30.0–36.0)
MCV: 86.4 fL (ref 80.0–100.0)
Platelets: 218 K/uL (ref 150–400)
RBC: 4.19 MIL/uL (ref 3.87–5.11)
RDW: 12.3 % (ref 11.5–15.5)
WBC: 8.2 K/uL (ref 4.0–10.5)
nRBC: 0 % (ref 0.0–0.2)

## 2024-03-20 LAB — BASIC METABOLIC PANEL WITH GFR
Anion gap: 13 (ref 5–15)
Anion gap: 9 (ref 5–15)
Anion gap: 16 — ABNORMAL HIGH (ref 5–15)
BUN: 8 mg/dL (ref 8–23)
BUN: 8 mg/dL (ref 8–23)
BUN: 11 mg/dL (ref 8–23)
CO2: 22 mmol/L (ref 22–32)
CO2: 22 mmol/L (ref 22–32)
CO2: 18 mmol/L — ABNORMAL LOW (ref 22–32)
Calcium: 8.8 mg/dL — ABNORMAL LOW (ref 8.9–10.3)
Calcium: 8.4 mg/dL — ABNORMAL LOW (ref 8.9–10.3)
Calcium: 8.5 mg/dL — ABNORMAL LOW (ref 8.9–10.3)
Chloride: 95 mmol/L — ABNORMAL LOW (ref 98–111)
Chloride: 99 mmol/L (ref 98–111)
Chloride: 99 mmol/L (ref 98–111)
Creatinine, Ser: 0.73 mg/dL (ref 0.44–1.00)
Creatinine, Ser: 0.73 mg/dL (ref 0.44–1.00)
Creatinine, Ser: 0.86 mg/dL (ref 0.44–1.00)
GFR, Estimated: >60 mL/min (ref >60)
GFR, Estimated: >60 mL/min (ref >60)
GFR, Estimated: >60 mL/min (ref >60)
Glucose, Bld: 100 mg/dL — ABNORMAL HIGH (ref 70–99)
Glucose, Bld: 115 mg/dL — ABNORMAL HIGH (ref 70–99)
Glucose, Bld: 119 mg/dL — ABNORMAL HIGH (ref 70–99)
Potassium: 3.6 mmol/L (ref 3.5–5.1)
Potassium: 3.2 mmol/L — ABNORMAL LOW (ref 3.5–5.1)
Potassium: 3.8 mmol/L (ref 3.5–5.1)
Sodium: 130 mmol/L — ABNORMAL LOW (ref 135–145)
Sodium: 130 mmol/L — ABNORMAL LOW (ref 135–145)
Sodium: 133 mmol/L — ABNORMAL LOW (ref 135–145)

## 2024-03-20 LAB — CK: Total CK: 1890 U/L — ABNORMAL HIGH (ref 38–234)

## 2024-03-20 LAB — TSH: TSH: 5.145 u[IU]/mL — ABNORMAL HIGH (ref 0.350–4.500)

## 2024-03-20 LAB — MAGNESIUM: Magnesium: 1.5 mg/dL — ABNORMAL LOW (ref 1.7–2.4)

## 2024-03-20 LAB — PHOSPHORUS: Phosphorus: 2.9 mg/dL (ref 2.5–4.6)

## 2024-03-20 MED ORDER — POTASSIUM CHLORIDE IN NACL 20-0.9 MEQ/L-% IV SOLN
INTRAVENOUS | Status: DC
  Filled 2024-03-20 (×2): qty 1000

## 2024-03-20 MED ORDER — POTASSIUM CHLORIDE 10 MEQ/100ML IV SOLN
10.0000 meq | INTRAVENOUS | Status: AC
  Administered 2024-03-20 (×2): 10 meq via INTRAVENOUS
  Filled 2024-03-20 (×2): qty 100

## 2024-03-20 MED ORDER — SODIUM CHLORIDE 0.45 % IV SOLN
INTRAVENOUS | Status: AC
  Filled 2024-03-20 (×3): qty 75

## 2024-03-20 MED ORDER — LEVOTHYROXINE SODIUM 50 MCG PO TABS
75.0000 ug | ORAL_TABLET | Freq: Every day | ORAL | Status: DC
  Administered 2024-03-21 – 2024-03-22 (×2): 75 ug via ORAL
  Filled 2024-03-20 (×2): qty 2

## 2024-03-20 MED ORDER — MAGNESIUM SULFATE 4 GM/100ML IV SOLN
4.0000 g | Freq: Once | INTRAVENOUS | Status: AC
  Administered 2024-03-20: 4 g via INTRAVENOUS
  Filled 2024-03-20: qty 100

## 2024-03-20 NOTE — Evaluation (Signed)
 Physical Therapy Evaluation Patient Details Name: Jodi Colon MRN: [id number] DOB: 05-19-1930 Today's Date: 03/20/2024  History of Present Illness  Pt is a 89 y.o. female who presents with weakness, multiple falls, dysuria. Current MD assessment: Rhabdomyolysis, hyponatremia, and possible complicated UTI. PMH of bladder cancer diagnosed with high-grade TA TCC of the bladder in 2000 (s/p of BCG and intra bladder chemotherapy), h/o CVA, h/o subarachnoid hemorrhage/brain aneurysm (followed by Neurosurgery), brain aneurysm, CVD s/p PTCA R MCA,  HTN,  HLD, anemia, cataract, GERD, osteopenia, HLD, RLS, spinal stenosis, thyroid  disease, h/o urinary incontinence, depression with anxiety, hyponatremia, breast cancer (s/p of right lumpectomy and radiation therapy.   Clinical Impression  Pt A&Ox4, agreeable to participate in PT evaluation. Per pt, at baseline she is ambulatory with rollator, lives alone, cites multiple falls. Pt was met sitting in recliner, modA to perform STS with mod VC for hand placement on armrests/RW. Attempted to have pt take forward steps but was unable to perform. Pt able to side-step toward bed with minAx2 for RW positoning, max multimodal cues for sequencing. No LOB but required minA to initiate steps and coordinate BLE movement. Pt returned to supine with minA trunk/BLE assist to initiate transfer. Pt was left semi-supine in bed at end of session, all needs in reach. Pt would benefit from skilled PT intervention to address listed deficits (see PT Problem List) and improve overall mobility status.         If plan is discharge home, recommend the following: A lot of help with walking and/or transfers;A lot of help with bathing/dressing/bathroom;Assistance with cooking/housework;Direct supervision/assist for medications management;Assist for transportation;Help with stairs or ramp for entrance;Direct supervision/assist for financial management   Can travel by private vehicle    No    Equipment Recommendations Other (comment) (TBD)  Recommendations for Other Services       Functional Status Assessment Patient has had a recent decline in their functional status and demonstrates the ability to make significant improvements in function in a reasonable and predictable amount of time.     Precautions / Restrictions Precautions Precautions: Fall Recall of Precautions/Restrictions: Impaired Restrictions Weight Bearing Restrictions Per Provider Order: No      Mobility  Bed Mobility Overal bed mobility: Needs Assistance Bed Mobility: Sit to Supine       Sit to supine: Min assist   General bed mobility comments: minA trunk/BLE assist to return to supine with VC for sequencing    Transfers Overall transfer level: Needs assistance Equipment used: Rolling walker (2 wheels) Transfers: Sit to/from Stand Sit to Stand: Mod assist           General transfer comment: STS from recliner with modA and VC for hand placement on recliner/RW.    Ambulation/Gait Ambulation/Gait assistance: Min assist, +2 safety/equipment Gait Distance (Feet): 3 Feet Assistive device: Rolling walker (2 wheels) Gait Pattern/deviations: Step-to pattern, Shuffle Gait velocity: decreased     General Gait Details: Attempted to have pt take froward steps, pt unable to . Pt able to side-step toward bed with minAx2 for RW management. No LOB.  Stairs            Wheelchair Mobility     Tilt Bed    Modified Rankin (Stroke Patients Only)       Balance Overall balance assessment: Needs assistance Sitting-balance support: Feet supported Sitting balance-Leahy Scale: Fair Sitting balance - Comments: minA initially due to posterior lean Postural control: Posterior lean Standing balance support: Bilateral upper extremity supported, Reliant on assistive  device for balance Standing balance-Leahy Scale: Poor Standing balance comment: external support required                              Pertinent Vitals/Pain Pain Assessment Pain Assessment: No/denies pain    Home Living Family/patient expects to be discharged to:: Private residence Living Arrangements: Alone Available Help at Discharge: Family;Available PRN/intermittently Type of Home: House Home Access: Stairs to enter Entrance Stairs-Rails: Left;Right;Can reach both Entrance Stairs-Number of Steps: 1   Home Layout: One level Home Equipment: Rollator (4 wheels);Grab bars - toilet;Grab bars - tub/shower;BSC/3in1;Hand held shower head      Prior Function Prior Level of Function : Needs assist;History of Falls (last six months)             Mobility Comments: pt reports using rollator for ambulation, multiple falls ADLs Comments: IND with ADLs     Extremity/Trunk Assessment   Upper Extremity Assessment Upper Extremity Assessment: Defer to OT evaluation    Lower Extremity Assessment Lower Extremity Assessment: Generalized weakness       Communication   Communication Communication: Impaired Factors Affecting Communication: Hearing impaired;Reduced clarity of speech    Cognition Arousal: Alert Behavior During Therapy: WFL for tasks assessed/performed, Flat affect   PT - Cognitive impairments: Initiation, Sequencing, Safety/Judgement                       PT - Cognition Comments: able to answer orientation questions, requires frequent cues to redirect to task Following commands: Impaired Following commands impaired: Only follows one step commands consistently     Cueing Cueing Techniques: Verbal cues, Tactile cues, Visual cues     General Comments      Exercises     Assessment/Plan    PT Assessment Patient needs continued PT services  PT Problem List Decreased strength;Decreased activity tolerance;Decreased balance;Decreased mobility;Decreased safety awareness       PT Treatment Interventions DME instruction;Gait training;Stair training;Functional  mobility training;Therapeutic activities;Therapeutic exercise;Balance training;Neuromuscular re-education;Cognitive remediation;Patient/family education    PT Goals (Current goals can be found in the Care Plan section)  Acute Rehab PT Goals Patient Stated Goal: none stated PT Goal Formulation: With patient Time For Goal Achievement: 04/03/24 Potential to Achieve Goals: Fair    Frequency Min 2X/week     Co-evaluation               AM-PAC PT 6 Clicks Mobility  Outcome Measure Help needed turning from your back to your side while in a flat bed without using bedrails?: A Little Help needed moving from lying on your back to sitting on the side of a flat bed without using bedrails?: A Lot Help needed moving to and from a bed to a chair (including a wheelchair)?: A Lot Help needed standing up from a chair using your arms (e.g., wheelchair or bedside chair)?: A Little Help needed to walk in hospital room?: A Lot Help needed climbing 3-5 steps with a railing? : Total 6 Click Score: 13    End of Session   Activity Tolerance: Patient limited by fatigue Patient left: in bed;with call bell/phone within reach;with bed alarm set Nurse Communication: Mobility status PT Visit Diagnosis: Unsteadiness on feet (R26.81);Muscle weakness (generalized) (M62.81);History of falling (Z91.81);Difficulty in walking, not elsewhere classified (R26.2)    Time: 8661-8651 PT Time Calculation (min) (ACUTE ONLY): 10 min   Charges:   PT Evaluation $PT Eval Low Complexity: 1 Low  PT General Charges $$ ACUTE PT VISIT: 1 Visit         Zadkiel Dragan, SPT

## 2024-03-20 NOTE — Progress Notes (Signed)
 PT Cancellation Note  Patient Details Name: Jodi Colon MRN: [id number] DOB: [birth year]   Cancelled Treatment:    Reason Eval/Treat Not Completed: Fatigue/lethargy limiting ability to participate Attempted to see for PT evaluation, pt lethargic and unable to maintain arousal for effective PT session. Will re-attempt at later time/date when appropriate.   Adeline Petitfrere, SPT

## 2024-03-20 NOTE — TOC CM/SW Note (Signed)
 New recs noted for SNF Existing PASRR Fl2 sent for signature Bed search initiated  CM to follow up with patient to complete assessment

## 2024-03-20 NOTE — Evaluation (Signed)
 Occupational Therapy Evaluation Patient Details Name: MARRIE CHANDRA MRN: [id number] DOB: 09/04/1929 Today's Date: 03/20/2024   History of Present Illness   Pt is a 89 y.o. female who presents with weakness, multiple falls, dysuria. Current MD assessment: Rhabdomyolysis, hyponatremia, and possible complicated UTI. PMH of bladder cancer diagnosed with high-grade TA TCC of the bladder in 2000 (s/p of BCG and intra bladder chemotherapy), h/o CVA, h/o subarachnoid hemorrhage/brain aneurysm (followed by Neurosurgery), brain aneurysm, CVD s/p PTCA R MCA,  HTN,  HLD, anemia, cataract, GERD, osteopenia, HLD, RLS, spinal stenosis, thyroid  disease, h/o urinary incontinence, depression with anxiety, hyponatremia, breast cancer (s/p of right lumpectomy and radiation therapy.     Clinical Impressions Pt was seen for OT evaluation this date. PTA,  pt lived alone with family checking in. She was independent for ADLs, received assistance for IADLs, and was Mod I for functional mobility using a rollator. Endorses multiple falls.  Pt presents with deficits in strength, balance, activity tolerance and safety awareness, affecting safe and optimal ADL completion. She is alert and oriented x4, although increased time to recall and report what brought her into the hospital. Able to follow one step commands consistently. Pt currently requires MOD A for bed mobility, Mod/Max A for STS from EOB with posterior lean and fear of falling noted. Min/Mod A, cues for hand placement and anterior weight shift for standing and step pivot to recliner with increased time, RW management required and shuffling, effortful steps. Multiple STS from recliner performed for LB dressing and standing peri-care to be performed requiring Max A d/t pt's poor static standing balance.  Pt would benefit from skilled OT services to address noted impairments and functional limitations to maximize safety and independence while minimizing future risk of falls,  injury, and readmission. Do anticipate the need for follow up OT services upon acute hospital DC.      If plan is discharge home, recommend the following:   A lot of help with bathing/dressing/bathroom;A lot of help with walking and/or transfers;Assistance with cooking/housework;Help with stairs or ramp for entrance     Functional Status Assessment   Patient has had a recent decline in their functional status and demonstrates the ability to make significant improvements in function in a reasonable and predictable amount of time.     Equipment Recommendations   Other (comment) (defer to next venue)     Recommendations for Other Services         Precautions/Restrictions   Precautions Precautions: Fall Recall of Precautions/Restrictions: Impaired Restrictions Weight Bearing Restrictions Per Provider Order: No     Mobility Bed Mobility Overal bed mobility: Needs Assistance Bed Mobility: Supine to Sit     Supine to sit: Mod assist, HOB elevated, Used rails     General bed mobility comments: cues for hand placement and assist to bring trunk into upright position d/t posterior lean and fear of falling noted    Transfers Overall transfer level: Needs assistance Equipment used: Rolling walker (2 wheels) (youth RW) Transfers: Sit to/from Stand, Bed to chair/wheelchair/BSC Sit to Stand: Mod assist, Max assist     Step pivot transfers: Min assist, Mod assist     General transfer comment: Mod/Max A with cues for anterior weight shift and hand placement to stand from EOB and recliner during session, Min/Mod A for SPT for RW management assist and cues for seqeuencing with shuffling, effortful steps to recliner      Balance Overall balance assessment: Needs assistance Sitting-balance support: Single extremity supported Sitting  balance-Leahy Scale: Fair Sitting balance - Comments: Min/Mod A initially d/t posterior lean progressing to CGA--unable to touch floor d/t  height Postural control: Posterior lean Standing balance support: Reliant on assistive device for balance, Bilateral upper extremity supported Standing balance-Leahy Scale: Poor Standing balance comment: external support required at all times during RW use and standing d/t posterior lean                           ADL either performed or assessed with clinical judgement   ADL Overall ADL's : Needs assistance/impaired     Grooming: Wash/dry face;Sitting;Set up Grooming Details (indicate cue type and reason): seated in recliner             Lower Body Dressing: Maximal assistance;Sit to/from stand Lower Body Dressing Details (indicate cue type and reason): change out pull-up, unable to reach feet and unsteady standing to pull over hips Toilet Transfer: Stand-pivot;Minimal assistance;Moderate assistance;Rolling walker (2 wheels) Toilet Transfer Details (indicate cue type and reason): simulated to recliner using RW with mod cueing for sequencing and initiation and assist for RW management required Toileting- Clothing Manipulation and Hygiene: Maximal assistance;Sit to/from stand Toileting - Clothing Manipulation Details (indicate cue type and reason): standing peri-care d/t poor static standing balance             Vision         Perception         Praxis         Pertinent Vitals/Pain Pain Assessment Pain Assessment: 0-10 Pain Score: 8  Pain Location: reporst leg spasms and sore from RLS Pain Descriptors / Indicators: Sore Pain Intervention(s): Monitored during session, Repositioned     Extremity/Trunk Assessment Upper Extremity Assessment Upper Extremity Assessment: Generalized weakness   Lower Extremity Assessment Lower Extremity Assessment: Generalized weakness       Communication Communication Communication: Impaired Factors Affecting Communication: Hearing impaired   Cognition Arousal: Alert Behavior During Therapy: WFL for tasks  assessed/performed Cognition: Cognition impaired           Executive functioning impairment (select all impairments): Sequencing, Initiation OT - Cognition Comments: a and ox3-4 increased time to report what happened bringing her in; cues for safety                 Following commands: Impaired Following commands impaired: Only follows one step commands consistently     Cueing  General Comments   Cueing Techniques: Verbal cues;Tactile cues  VSS   Exercises Other Exercises Other Exercises: Edu on role of OT in acute setting.   Shoulder Instructions      Home Living Family/patient expects to be discharged to:: Private residence Living Arrangements: Alone Available Help at Discharge: Family;Available PRN/intermittently Type of Home: House Home Access: Stairs to enter [employer] of Steps: 1 Entrance Stairs-Rails: Left;Right;Can reach both Home Layout: One level     Bathroom Shower/Tub: Producer, television/film/video: Handicapped height     Home Equipment: Rollator (4 wheels);Grab bars - toilet;Grab bars - tub/shower;BSC/3in1;Hand held shower head          Prior Functioning/Environment Prior Level of Function : Needs assist;History of Falls (last six months)       Physical Assist : ADLs (physical)   ADLs (physical): IADLs Mobility Comments: rollator MOD I household distances; multiple falls ADLs Comments: IND with ADLs, reports does light IADLs and family provides transportation and orders groceries to be delivered; hired cleaner; meds come pre packaged  OT Problem List: Decreased strength;Decreased activity tolerance;Impaired balance (sitting and/or standing);Decreased safety awareness   OT Treatment/Interventions: Self-care/ADL training;Therapeutic exercise;Patient/family education;Balance training;Therapeutic activities;DME and/or AE instruction      OT Goals(Current goals can be found in the care plan section)   Acute Rehab OT  Goals Patient Stated Goal: get stronger OT Goal Formulation: With patient Time For Goal Achievement: 04/03/24 Potential to Achieve Goals: Good ADL Goals Pt Will Perform Grooming: with supervision;standing;with contact guard assist Pt Will Perform Lower Body Dressing: with supervision;with contact guard assist;sit to/from stand;sitting/lateral leans Pt Will Transfer to Toilet: with supervision;with contact guard assist;ambulating   OT Frequency:  Min 2X/week    Co-evaluation              AM-PAC OT 6 Clicks Daily Activity     Outcome Measure Help from another person eating meals?: None Help from another person taking care of personal grooming?: A Little Help from another person toileting, which includes using toliet, bedpan, or urinal?: A Lot Help from another person bathing (including washing, rinsing, drying)?: A Lot Help from another person to put on and taking off regular upper body clothing?: A Little Help from another person to put on and taking off regular lower body clothing?: A Lot 6 Click Score: 16   End of Session Equipment Utilized During Treatment: Gait belt;Rolling walker (2 wheels) (youth RW) Nurse Communication: Mobility status  Activity Tolerance: Patient tolerated treatment well Patient left: in chair;with call bell/phone within reach;with chair alarm set  OT Visit Diagnosis: Other abnormalities of gait and mobility (R26.89);Muscle weakness (generalized) (M62.81);Unsteadiness on feet (R26.81);Repeated falls (R29.6)                Time: 9188-9152 OT Time Calculation (min): 36 min Charges:  OT General Charges $OT Visit: 1 Visit OT Evaluation $OT Eval Moderate Complexity: 1 Mod OT Treatments $Self Care/Home Management : 8-22 mins Coal Nearhood, OTR/L 03/20/24, 9:05 AM  Romaine Neville E Alaric Gladwin 03/20/2024, 9:01 AM

## 2024-03-20 NOTE — Hospital Course (Signed)
 Jodi Colon is a 89 y.o. female with medical history significant of bladder cancer diagnosed with high-grade TA TCC of the bladder in 2000 (s/p of BCG and intra bladder chemotherapy), h/o CVA, h/o subarachnoid hemorrhage/brain aneurysm (followed by Neurosurgery), brain aneurysm, CVD s/p PTCA R MCA,  HTN,  HLD, anemia, cataract, GERD, osteopenia, HLD, RLS, spinal stenosis, thyroid  disease, h/o urinary incontinence, depression with anxiety, hyponatremia, breast cancer (s/p of right lumpectomy and radiation therapy), who presents with weakness, multiple falls, dysuria.   Initial sodium was 124, CK level 1540, she was given IV fluids.  She is also eval by PT/OT, recommended nursing home placement.

## 2024-03-20 NOTE — Progress Notes (Signed)
 Progress Note   Patient: Jodi Colon FMW:[id number] DOB: May 15, 1930 DOA: 03/19/2024     1 DOS: the patient was seen and examined on 03/20/2024   Brief hospital course: Jodi Colon is a 89 y.o. female with medical history significant of bladder cancer diagnosed with high-grade TA TCC of the bladder in 2000 (s/p of BCG and intra bladder chemotherapy), h/o CVA, h/o subarachnoid hemorrhage/brain aneurysm (followed by Neurosurgery), brain aneurysm, CVD s/p PTCA R MCA,  HTN,  HLD, anemia, cataract, GERD, osteopenia, HLD, RLS, spinal stenosis, thyroid  disease, h/o urinary incontinence, depression with anxiety, hyponatremia, breast cancer (s/p of right lumpectomy and radiation therapy), who presents with weakness, multiple falls, dysuria.   Initial sodium was 124, CK level 1540, she was given IV fluids.  She is also eval by PT/OT, recommended nursing home placement.   Principal Problem:   Rhabdomyolysis Active Problems:   Hyponatremia   Complicated UTI (urinary tract infection)   Bladder cancer (HCC)   Fall at home, initial encounter   Hyperlipidemia with target low density lipoprotein (LDL) cholesterol less than 100 mg/dL   Hypothyroidism   Cerebrovascular disease   Essential hypertension   Depression with anxiety   RLS (restless legs syndrome)   Overweight (BMI 25.0-29.9)   Assessment and Plan: Traumatic rhabdomyolysis Frequent falls. Generalized weakness. Patient has weakness, had  fall twice during the last few days.  Rhabdomyolysis was from the fall. Patient was given IV fluids, will continue.  Renal function still normal. Patient also seen by PT/OT, recommending nursing home placement.   Hyponatremia Hypokalemia. Hypomagnesemia. Has some loose stools, will check a C. difficile toxin.  Electrolyte abnormality appear to be secondary to loose stools/diarrhea.  Continue normal saline with added potassium.  Also replete potassium and magnesium .  Recheck levels tomorrow.    Possible complicated UTI (urinary tract infection) History of bladder cancer.:  UA is positive.  Patient has typical symptoms of UTI, but patient has history of bladder cancer, her symptoms are at least partially from history of bladder cancer. Continue Rocephin , pending urine culture results.   Hyperlipidemia with target low density lipoprotein (LDL) cholesterol less than 100 mg/dL -Lipitor    Hypothyroidism -Synthroid  50 mcg daily TSH 5.1.  Increase Synthroid  to 75 mcg daily.   Cerebrovascular disease -Lipitor    Essential hypertension: Blood pressure 183/85.  Patient is taking oral as needed hydralazine  at home -IV hydralazine  as needed - Will schedule oral hydralazine  50 mg 3 times daily   Depression with anxiety -Continue Effexor  and hydroxyzine  as needed   RLS (restless legs syndrome) -Requip    Overweight (BMI 25.0-29.9): Body weight 52.6 kg, BMI 25.10 - Encourage losing weight - Exercise healthy diet           Subjective:  Patient had reported 1 loose stool today, no nausea vomiting.  Tolerating diet.  Denies any shortness of breath.  Physical Exam: Vitals:   03/19/24 2329 03/20/24 0210 03/20/24 0631 03/20/24 0811  BP: (!) 162/75 (!) 105/27 (!) 103/58 104/73  Pulse: 100 87 84 87  Resp: (!) 21 20  16   Temp: 97.9 F (36.6 C) (!) 97.4 F (36.3 C) 98.7 F (37.1 C) 98.1 F (36.7 C)  TempSrc: Oral Oral Oral Oral  SpO2: 98% 96%  98%  Weight:      Height:       General exam: Appears calm and comfortable, frail Respiratory system: Clear to auscultation. Respiratory effort normal. Cardiovascular system: S1 & S2 heard, RRR. No JVD, murmurs, rubs, gallops or clicks. No  pedal edema. Gastrointestinal system: Abdomen is nondistended, soft and nontender. No organomegaly or masses felt. Normal bowel sounds heard. Central nervous system: Alert and oriented x3.  No focal neurological deficits. Extremities: Symmetric 5 x 5 power. Skin: No rashes, lesions or  ulcers Psychiatry: Judgement and insight appear normal. Mood & affect appropriate.    Data Reviewed:  Reviewed CT scan results, x-ray results and lab results.  Family Communication: None  Disposition: Status is: Inpatient Remains inpatient appropriate because: Severity of disease, IV treatment,     Time spent: 50 minutes  Author: Murvin Mana, MD 03/20/2024 12:04 PM  For on call review [email or web address].

## 2024-03-20 NOTE — [place] FL2 (Signed)
 [place]  MEDICAID FL2 LEVEL OF CARE FORM     IDENTIFICATION  Patient Name: Jodi Colon Birthdate: 08/06/1929 Sex: female Admission Date (Current Location): 03/19/2024  [facility] and [place] Number:  Chiropodist and Address:         Provider Number: [id number]  Attending Physician Name and Address:  Laurita Pillion, MD  Relative Name and Phone Number:       Current Level of Care: Hospital Recommended Level of Care: Skilled Nursing Facility Prior Approval Number:    Date Approved/Denied:   PASRR Number: [id number]  Discharge Plan: SNF    Current Diagnoses: Patient Active Problem List   Diagnosis Date Noted   Rhabdomyolysis 03/19/2024   Fall at home, initial encounter 03/19/2024   Depression with anxiety 03/19/2024   RLS (restless legs syndrome) 03/19/2024   Overweight (BMI 25.0-29.9) 03/19/2024   Complicated UTI (urinary tract infection) 07/30/2022   Diarrhea 07/30/2022   Bacteremia due to Escherichia coli 07/29/2022   Hyponatremia 07/27/2022   Intractable diarrhea 07/27/2022   Acute renal failure 07/27/2022   Syncope 01/29/2020   Intracranial vascular stenosis 01/29/2020   Falls frequently 12/17/2019   Seizure-like activity (HCC) 12/17/2019   Weakness 12/17/2019   Macular puckering, right eye 05/18/2019   Mechanical breakdown of intraocular lens 05/18/2019   Primary open angle glaucoma (POAG) of both eyes, moderate stage 05/18/2019   Pseudophakia of both eyes 05/18/2019   Status post total hip replacement, left 11/08/2017   SOBOE (shortness of breath on exertion) 10/04/2017   Umbilical hernia without obstruction and without gangrene 07/28/2016   Restless leg 10/21/2015   Urinary frequency 10/15/2015   Dysuria 10/15/2015   Bursitis, trochanteric 04/02/2015   Trochanteric bursitis of right hip 04/02/2015   Hypothyroidism 03/04/2015   GERD without esophagitis 03/04/2015   Subarachnoid hemorrhage (HCC) 02/09/2015   Degenerative arthritis of  lumbar spine 01/29/2015   DDD (degenerative disc disease), lumbar 01/29/2015   Arthritis, degenerative 01/28/2014   Infection with methicillin-resistant Staphylococcus aureus 01/28/2014   Essential hypertension 01/28/2014   Dyslipidemia 01/28/2014   Carcinoma in situ, breast, ductal 01/28/2014   Depression 01/28/2014   Aneurysm, cerebral 01/28/2014   Intraductal carcinoma of breast 01/28/2014   Lumbar canal stenosis 12/07/2013   Neuritis or radiculitis due to rupture of lumbar intervertebral disc 12/07/2013   Cramp in lower leg 06/19/2013   Hemorrhoids that prolapse with straining, but retract spontaneously 01/24/2013   Scalp lesion 11/27/2012   Rectal prolapse 11/27/2012   Hemorrhoid 08/29/2012   Screening breast examination 07/12/2012   FOM (frequency of micturition) 02/28/2012   Urge incontinence 02/28/2012   History of neoplasm of bladder 02/28/2012   Excessive urination at night 02/28/2012   Bladder CA in situ 02/28/2012   History of methicillin resistant Staphylococcus aureus infection 12/30/2011   H/O infectious disease 12/30/2011   Cutaneous ulcer (HCC) 11/26/2011   Skin ulcer (HCC) 11/26/2011   Colon polyp 11/11/2011   Absolute anemia 11/11/2011   Back pain, chronic 11/01/2011   Abscess, gluteal, right 10/10/2011   Abscess of buttock 10/10/2011   Degenerative joint disease    Cerebrovascular disease    MRSA (methicillin resistant Staphylococcus aureus)    Hyperlipidemia with target low density lipoprotein (LDL) cholesterol less than 100 mg/dL 88/97/7987   Bladder cancer (HCC) 10/29/2009   Malignant neoplasm of urinary bladder (HCC) 10/29/2009    Orientation RESPIRATION BLADDER Height & Weight     Self, Situation, Time  Normal Incontinent Weight: 52.6 kg Height:  4' 9 (144.8 cm)  BEHAVIORAL SYMPTOMS/MOOD NEUROLOGICAL BOWEL NUTRITION STATUS      Continent Diet (Regular)  AMBULATORY STATUS COMMUNICATION OF NEEDS Skin   Extensive Assist Verbally Other (Comment)  (erythema)                       Personal Care Assistance Level of Assistance              Functional Limitations Info             SPECIAL CARE FACTORS FREQUENCY  PT (By licensed PT), OT (By licensed OT)                    Contractures      Additional Factors Info  Code Status, Allergies Code Status Info: DNR Allergies Info: Penicillins, Sulfa Antibiotics, Ciprofloxacin , Sulfamethoxazole, Sulfasalazine           Current Medications (03/20/2024):  This is the current hospital active medication list Current Facility-Administered Medications  Medication Dose Route Frequency Provider Last Rate Last Admin   0.9 % NaCl with KCl 20 mEq/ L  infusion   Intravenous Continuous Laurita Pillion, MD 75 mL/hr at 03/20/24 0926 New Bag at 03/20/24 0926   acetaminophen  (TYLENOL ) tablet 650 mg  650 mg Oral Q6H PRN Niu, Xilin, MD       acidophilus (RISAQUAD) capsule 1 capsule  1 capsule Oral Daily Niu, Xilin, MD   1 capsule at 03/20/24 9075   atorvastatin  (LIPITOR ) tablet 10 mg  10 mg Oral Daily Niu, Xilin, MD   10 mg at 03/20/24 9075   cefTRIAXone  (ROCEPHIN ) 1 g in sodium chloride  0.9 % 100 mL IVPB  1 g Intravenous Q24H Niu, Xilin, MD 200 mL/hr at 03/20/24 0929 1 g at 03/20/24 0929   enoxaparin  (LOVENOX ) injection 40 mg  40 mg Subcutaneous Q24H Niu, Xilin, MD   40 mg at 03/20/24 0923   famotidine (PEPCID) tablet 40 mg  40 mg Oral QHS Belue, Nathan S, RPH       fesoterodine (TOVIAZ) tablet 4 mg  4 mg Oral Daily Niu, Xilin, MD   4 mg at 03/20/24 9071   hydrALAZINE  (APRESOLINE ) injection 5 mg  5 mg Intravenous Q2H PRN Niu, Xilin, MD       hydrALAZINE  (APRESOLINE ) tablet 50 mg  50 mg Oral Q8H Niu, Xilin, MD   50 mg at 03/19/24 2356   hydrOXYzine  (ATARAX ) tablet 25 mg  25 mg Oral Daily Niu, Xilin, MD   25 mg at 03/20/24 0924   [START ON 03/21/2024] levothyroxine  (SYNTHROID ) tablet 75 mcg  75 mcg Oral Q0600 Laurita Pillion, MD       loratadine  (CLARITIN ) tablet 10 mg  10 mg Oral Daily Niu,  Xilin, MD   10 mg at 03/20/24 0924   multivitamin with minerals tablet 1 tablet  1 tablet Oral Daily Niu, Xilin, MD   1 tablet at 03/20/24 9075   ondansetron  (ZOFRAN ) injection 4 mg  4 mg Intravenous Q8H PRN Niu, Xilin, MD   4 mg at 03/20/24 1023   pantoprazole  (PROTONIX ) EC tablet 40 mg  40 mg Oral BID Niu, Xilin, MD   40 mg at 03/20/24 9075   rOPINIRole  (REQUIP ) tablet 3 mg  3 mg Oral QHS Niu, Xilin, MD   3 mg at 03/19/24 2357   sodium chloride  tablet 1 g  1 g Oral BID WC Niu, Xilin, MD   1 g at 03/20/24 9075   venlafaxine  (EFFEXOR ) tablet 75 mg  75 mg  Oral Daily Niu, Xilin, MD   75 mg at 03/20/24 0928   Facility-Administered Medications Ordered in Other Encounters  Medication Dose Route Frequency Provider Last Rate Last Admin   gemcitabine  (GEMZAR ) 2,000 mg  2,000 mg Intravenous Once Finnegan, Timothy J, MD       opium -belladonna (B&O SUPPRETTES) 16.2-30 MG suppository 30 mg  30 mg Rectal Once Brandon, Ashley, MD       opium -belladonna (B&O SUPPRETTES) 16.2-30 MG suppository 30 mg  30 mg Rectal Once Finnegan, Timothy J, MD       opium -belladonna (B&O SUPPRETTES) 16.2-30 MG suppository 30 mg  30 mg Rectal Once Finnegan, Timothy J, MD       White Petrolatum  OINT 1 application  1 application  Apply externally Once Finnegan, Timothy J, MD         Discharge Medications: Please see discharge summary for a list of discharge medications.  Relevant Imaging Results:  Relevant Lab Results:   Additional Information SS# [id number]  [name], RN

## 2024-03-21 ENCOUNTER — Encounter: Payer: Self-pay | Admitting: Internal Medicine

## 2024-03-21 ENCOUNTER — Inpatient Hospital Stay

## 2024-03-21 DIAGNOSIS — T796XXA Traumatic ischemia of muscle, initial encounter: Secondary | ICD-10-CM | POA: Diagnosis not present

## 2024-03-21 LAB — BASIC METABOLIC PANEL WITH GFR
Anion gap: 8 (ref 5–15)
BUN: 11 mg/dL (ref 8–23)
CO2: 25 mmol/L (ref 22–32)
Calcium: 8.1 mg/dL — ABNORMAL LOW (ref 8.9–10.3)
Chloride: 99 mmol/L (ref 98–111)
Creatinine, Ser: 0.73 mg/dL (ref 0.44–1.00)
GFR, Estimated: >60 mL/min (ref >60)
Glucose, Bld: 114 mg/dL — ABNORMAL HIGH (ref 70–99)
Potassium: 3.4 mmol/L — ABNORMAL LOW (ref 3.5–5.1)
Sodium: 132 mmol/L — ABNORMAL LOW (ref 135–145)

## 2024-03-21 LAB — URINE CULTURE

## 2024-03-21 LAB — CK: Total CK: 652 U/L — ABNORMAL HIGH (ref 38–234)

## 2024-03-21 LAB — PHOSPHORUS: Phosphorus: 3.2 mg/dL (ref 2.5–4.6)

## 2024-03-21 LAB — MAGNESIUM: Magnesium: 2.1 mg/dL (ref 1.7–2.4)

## 2024-03-21 MED ORDER — SODIUM CHLORIDE 0.9 % IV SOLN: INTRAVENOUS | Status: AC

## 2024-03-21 MED ORDER — POTASSIUM CHLORIDE CRYS ER 20 MEQ PO TBCR
40.0000 meq | EXTENDED_RELEASE_TABLET | Freq: Once | ORAL | Status: AC
  Administered 2024-03-21: 40 meq via ORAL
  Filled 2024-03-21: qty 2

## 2024-03-21 NOTE — TOC Initial Note (Signed)
 Transition of Care [facility]) - Initial/Assessment Note    Patient Details  Name: Jodi Colon MRN: [id number] Date of Birth: June 23, 1929  [facility] CM/SW Contact:    [name], RN Phone Number: 03/21/2024, 11:18 AM  Clinical Narrative:                    Met with patient at bedside Discuss recs for SNF.  Patient is in agreement CMS Medicare.gov Compare Post Acute Care list reviewed with patient She selects [name].  Accepted in HUB and notified Darrian at [employer] with IP Care Management to start auth    Patient states she will update family this afternoon when they come to visit   Patient Goals and CMS Choice            Expected Discharge Plan and Services                                              Prior Living Arrangements/Services                       Activities of Daily Living   ADL Screening (condition at time of admission) Independently performs ADLs?: Yes (appropriate for developmental age) Is the patient deaf or have difficulty hearing?: No Does the patient have difficulty seeing, even when wearing glasses/contacts?: No Does the patient have difficulty concentrating, remembering, or making decisions?: No  Permission Sought/Granted                  Emotional Assessment              Admission diagnosis:  Rhabdomyolysis [M62.82] Hyponatremia [E87.1] Fall, initial encounter [W19.XXXA] Traumatic rhabdomyolysis, initial encounter [T79.6XXA] Patient Active Problem List   Diagnosis Date Noted   Rhabdomyolysis 03/19/2024   Fall at home, initial encounter 03/19/2024   Depression with anxiety 03/19/2024   RLS (restless legs syndrome) 03/19/2024   Overweight (BMI 25.0-29.9) 03/19/2024   Complicated UTI (urinary tract infection) 07/30/2022   Diarrhea 07/30/2022   Bacteremia due to Escherichia coli 07/29/2022   Hyponatremia 07/27/2022   Intractable diarrhea 07/27/2022   Acute renal failure  07/27/2022   Syncope 01/29/2020   Intracranial vascular stenosis 01/29/2020   Falls frequently 12/17/2019   Seizure-like activity (HCC) 12/17/2019   Weakness 12/17/2019   Macular puckering, right eye 05/18/2019   Mechanical breakdown of intraocular lens 05/18/2019   Primary open angle glaucoma (POAG) of both eyes, moderate stage 05/18/2019   Pseudophakia of both eyes 05/18/2019   Status post total hip replacement, left 11/08/2017   SOBOE (shortness of breath on exertion) 10/04/2017   Umbilical hernia without obstruction and without gangrene 07/28/2016   Restless leg 10/21/2015   Urinary frequency 10/15/2015   Dysuria 10/15/2015   Bursitis, trochanteric 04/02/2015   Trochanteric bursitis of right hip 04/02/2015   Hypothyroidism 03/04/2015   GERD without esophagitis 03/04/2015   Subarachnoid hemorrhage (HCC) 02/09/2015   Degenerative arthritis of lumbar spine 01/29/2015   DDD (degenerative disc disease), lumbar 01/29/2015   Arthritis, degenerative 01/28/2014   Infection with methicillin-resistant Staphylococcus aureus 01/28/2014   Essential hypertension 01/28/2014   Dyslipidemia 01/28/2014   Carcinoma in situ, breast, ductal 01/28/2014   Depression 01/28/2014   Aneurysm, cerebral 01/28/2014   Intraductal carcinoma of breast 01/28/2014   Lumbar canal stenosis 12/07/2013   Neuritis or  radiculitis due to rupture of lumbar intervertebral disc 12/07/2013   Cramp in lower leg 06/19/2013   Hemorrhoids that prolapse with straining, but retract spontaneously 01/24/2013   Scalp lesion 11/27/2012   Rectal prolapse 11/27/2012   Hemorrhoid 08/29/2012   Screening breast examination 07/12/2012   FOM (frequency of micturition) 02/28/2012   Urge incontinence 02/28/2012   History of neoplasm of bladder 02/28/2012   Excessive urination at night 02/28/2012   Bladder CA in situ 02/28/2012   History of methicillin resistant Staphylococcus aureus infection 12/30/2011   H/O infectious disease  12/30/2011   Cutaneous ulcer (HCC) 11/26/2011   Skin ulcer (HCC) 11/26/2011   Colon polyp 11/11/2011   Absolute anemia 11/11/2011   Back pain, chronic 11/01/2011   Abscess, gluteal, right 10/10/2011   Abscess of buttock 10/10/2011   Degenerative joint disease    Cerebrovascular disease    MRSA (methicillin resistant Staphylococcus aureus)    Hyperlipidemia with target low density lipoprotein (LDL) cholesterol less than 100 mg/dL 88/97/7987   Bladder cancer (HCC) 10/29/2009   Malignant neoplasm of urinary bladder (HCC) 10/29/2009   PCP:  Ileen [name], NP Pharmacy:   [facility] - [place], [place] - [address] [place] [address] Phone: [phone] Fax: [phone]     Social Drivers of Health (SDOH) Social History: SDOH Screenings   Food Insecurity: No Food Insecurity (03/19/2024)  Housing: Low Risk  (03/19/2024)  Transportation Needs: No Transportation Needs (03/19/2024)  Utilities: Not At Risk (03/19/2024)  Social Connections: Moderately Isolated (03/19/2024)  Tobacco Use: Low Risk  (03/19/2024)   SDOH Interventions:     Readmission Risk Interventions    07/28/2022    9:46 AM  Readmission Risk Prevention Plan  Transportation Screening Complete  PCP or Specialist Appt within 3-5 Days Complete  HRI or Home Care Consult Complete  Medication Review (RN Care Manager) Complete

## 2024-03-21 NOTE — Plan of Care (Signed)

## 2024-03-21 NOTE — Progress Notes (Signed)
 Mobility Specialist - Progress Note   03/21/24 1050  Mobility  Activity Ambulated with assistance;Pivoted/transferred from bed to chair  Level of Assistance Minimal assist, patient does 75% or more (+2)  Assistive Device None (HHA)  Distance Ambulated (ft) 6 ft  Activity Response Tolerated well  Mobility visit 1 Mobility  Mobility Specialist Start Time (ACUTE ONLY) 1031  Mobility Specialist Stop Time (ACUTE ONLY) 1045  Mobility Specialist Time Calculation (min) (ACUTE ONLY) 14 min   Pt supine upon entry, utilizing RA. Pt expressed feeling well and agreeable to transfer to the recliner. Pt completed bed mob MinA, requiring CGA for post lean while seated EOB. Pt STS from EOB MinA +2 HHA, transferred to the recliner--- shuffle steps. Pt left seated with alarm set and needs within reach.  America Silvan Mobility Specialist 03/21/24 11:04 AM

## 2024-03-21 NOTE — Progress Notes (Signed)
  Progress Note   Patient: Jodi Colon FMW:[id number] DOB: 1930-01-10 DOA: 03/19/2024     2 DOS: the patient was seen and examined on 03/21/2024   Brief hospital course: Jodi Colon is a 89 y.o. female with medical history significant of bladder cancer diagnosed with high-grade TA TCC of the bladder in 2000 (s/p of BCG and intra bladder chemotherapy), h/o CVA, h/o subarachnoid hemorrhage/brain aneurysm (followed by Neurosurgery), brain aneurysm, CVD s/p PTCA R MCA,  HTN,  HLD, anemia, cataract, GERD, osteopenia, HLD, RLS, spinal stenosis, thyroid  disease, h/o urinary incontinence, depression with anxiety, hyponatremia, breast cancer (s/p of right lumpectomy and radiation therapy), who presents with weakness, multiple falls, dysuria.    Initial sodium was 124, CK level 1540, she was given IV fluids.   She is also eval by PT/OT, recommended nursing home placement.      Assessment and Plan:  Traumatic rhabdomyolysis Frequent falls. Generalized weakness. Patient has weakness, had  fallen twice during the last few days.   Rhabdomyolysis was from the fall. Continue IV fluid hydration Repeat CK levels in a.m. Patient also seen by PT/OT, recommending nursing home placement.    Hyponatremia Most likely related to SSRI use Monitor closely   Hypokalemia. Hypomagnesemia. No further episodes of diarrhea Supplement electrolytes    Possible complicated UTI (urinary tract infection) History of bladder cancer.:  UA is positive.   Patient has typical symptoms of UTI, but patient has history of bladder cancer, her symptoms are at least partially from history of bladder cancer. Urine culture is contaminated Continue Rocephin  to complete a 3 to 5-day course of therapy.    Hyperlipidemia with target low density lipoprotein (LDL) cholesterol less than 100 mg/dL -Lipitor    Hypothyroidism -Synthroid  50 mcg daily TSH 5.1.  Increase Synthroid  to 75 mcg daily.   Cerebrovascular  disease - Continue Lipitor    Essential hypertension:  - Continue hydralazine  50 mg 3 times daily   Depression with anxiety -Continue Effexor  and hydroxyzine  as needed   RLS (restless legs syndrome) -Requip    Overweight (BMI 25.0-29.9): Body weight 52.6 kg, BMI 25.10 - Encourage losing weight - Exercise healthy diet          Subjective: No new complaints  Physical Exam: Vitals:   03/20/24 2200 03/21/24 0351 03/21/24 0628 03/21/24 0724  BP: 107/74 (!) 145/55 (!) 128/56 (!) 147/65  Pulse: 88 87 93 84  Resp:  20  16  Temp:  97.9 F (36.6 C)  98.1 F (36.7 C)  TempSrc:  Oral  Oral  SpO2:  94%  95%  Weight:      Height:       General exam: Appears calm and comfortable, frail, weak Respiratory system: Clear to auscultation. Respiratory effort normal. Cardiovascular system: S1 & S2 heard, RRR. No JVD, murmurs, rubs, gallops or clicks. No pedal edema. Gastrointestinal system: Abdomen is nondistended, soft and nontender. No organomegaly or masses felt. Normal bowel sounds heard. Central nervous system: Alert and oriented x3.  No focal neurological deficits. Extremities: Symmetric 5 x 5 power. Skin: No rashes, lesions or ulcers Psychiatry: Judgement and insight appear normal. Mood & affect appropriate.     Data Reviewed: Total CK6 52, potassium 3.4, sodium 132,  Labs reviewed  Family Communication: None  Disposition: Status is: Inpatient Remains inpatient appropriate because: Awaiting discharge to SNF  Planned Discharge Destination: Skilled nursing facility    Time spent: 40 minutes  Author: Aimee Somerset, MD 03/21/2024 1:31 PM  For on call review [email or web address].

## 2024-03-22 DIAGNOSIS — T796XXA Traumatic ischemia of muscle, initial encounter: Secondary | ICD-10-CM | POA: Diagnosis not present

## 2024-03-22 DIAGNOSIS — R410 Disorientation, unspecified: Secondary | ICD-10-CM | POA: Clinically undetermined

## 2024-03-22 LAB — BASIC METABOLIC PANEL WITH GFR
Anion gap: 9 (ref 5–15)
BUN: 12 mg/dL (ref 8–23)
CO2: 25 mmol/L (ref 22–32)
Calcium: 8.4 mg/dL — ABNORMAL LOW (ref 8.9–10.3)
Chloride: 95 mmol/L — ABNORMAL LOW (ref 98–111)
Creatinine, Ser: 0.81 mg/dL (ref 0.44–1.00)
GFR, Estimated: >60 mL/min (ref >60)
Glucose, Bld: 114 mg/dL — ABNORMAL HIGH (ref 70–99)
Potassium: 3.8 mmol/L (ref 3.5–5.1)
Sodium: 129 mmol/L — ABNORMAL LOW (ref 135–145)

## 2024-03-22 LAB — URINALYSIS, ROUTINE W REFLEX MICROSCOPIC
Bilirubin Urine: NEGATIVE
Glucose, UA: NEGATIVE mg/dL
Ketones, ur: NEGATIVE mg/dL
Nitrite: NEGATIVE
Protein, ur: 100 mg/dL — AB
RBC / HPF: >50 RBC/hpf (ref 0–5)
Specific Gravity, Urine: 1.016 (ref 1.005–1.030)
WBC, UA: >50 WBC/hpf (ref 0–5)
pH: 7 (ref 5.0–8.0)

## 2024-03-22 LAB — CK: Total CK: 458 U/L — ABNORMAL HIGH (ref 38–234)

## 2024-03-22 MED ORDER — LEVOTHYROXINE SODIUM 75 MCG PO TABS: 75.0000 ug | ORAL_TABLET | Freq: Every day | ORAL | 0 refills | Status: AC

## 2024-03-22 MED ORDER — CEFUROXIME AXETIL 500 MG PO TABS: 500.0000 mg | ORAL_TABLET | Freq: Two times a day (BID) | ORAL | 0 refills | Status: AC

## 2024-03-22 MED ORDER — HYDRALAZINE HCL 50 MG PO TABS: 50.0000 mg | ORAL_TABLET | Freq: Three times a day (TID) | ORAL | 0 refills | Status: DC

## 2024-03-22 MED ORDER — FOSFOMYCIN TROMETHAMINE 3 G PO PACK
3.0000 g | PACK | Freq: Once | ORAL | Status: AC
  Administered 2024-03-22: 3 g via ORAL
  Filled 2024-03-22: qty 3

## 2024-03-22 MED ORDER — HYDRALAZINE HCL 50 MG PO TABS: 50.0000 mg | ORAL_TABLET | Freq: Two times a day (BID) | ORAL | 0 refills | Status: AC

## 2024-03-22 NOTE — Discharge Summary (Addendum)
 Physician Discharge Summary   Patient: Jodi Colon DOB: 02-May-1930  Admit date:     03/19/2024  Discharge date: 03/22/24  Discharge Physician: Jocelin Schuelke   PCP: Ileen [name], NP   Recommendations at discharge:   Complete antibiotic therapy as recommended Encourage oral fluid intake  Discharge Diagnoses: Principal Problem:   Rhabdomyolysis Active Problems:   Hyponatremia   Complicated UTI (urinary tract infection)   Bladder cancer (HCC)   Fall at home, initial encounter   Hyperlipidemia with target low density lipoprotein (LDL) cholesterol less than 100 mg/dL   Hypothyroidism   Cerebrovascular disease   Essential hypertension   Depression with anxiety   RLS (restless legs syndrome)   Overweight (BMI 25.0-29.9)   Delirium  Resolved Problems:   * No resolved hospital problems. [facility] Course: Jodi Colon is a 89 y.o. female with medical history significant of bladder cancer diagnosed with high-grade TA TCC of the bladder in 2000 (s/p of BCG and intra bladder chemotherapy), h/o CVA, h/o subarachnoid hemorrhage/brain aneurysm (followed by Neurosurgery), brain aneurysm, CVD s/p PTCA R MCA,  HTN,  HLD, anemia, cataract, GERD, osteopenia, HLD, RLS, spinal stenosis, thyroid  disease, h/o urinary incontinence, depression with anxiety, hyponatremia, breast cancer (s/p of right lumpectomy and radiation therapy), who presents with weakness, multiple falls, dysuria.   Per patient's and her daughter at bedside, she has generalized weakness recently.  She has had multiple falls recently.  She fell again last night, no LOC.  No unilateral numbness or tingling in extremities.  Per daughter, patient was found on the floor at about 15:00 PM today. Pt stated that she had been on the ground since either 1:30 in the morning or afternoon and she was not sure which one.  Patient complains of right hip pain.  Denies head or neck injury.  Patient does not have chest  pain, cough, SOB.  No nausea, vomiting, diarrhea or abdominal pain.  Patient reports dysuria, burning on urination and urinary frequency.  No hematuria.  No fever or chills. When I saw patient in ED, her mental status is at baseline.  She is oriented x 3.     Data reviewed independently and ED Course: pt was found to have CKD 1540, UA (clear appearance, moderate amount of leukocyte, rare bacteria, WBC 21-50), WBC 10.1, GFR> 60, sodium 124, lactic acid 1.4.  CT of head and CT of C-spine negative for acute injury.  X-ray of right hip negative for bony fracture.  Temperature normal, blood pressure 183/85, heart rate 90-100s, RR 21, oxygen saturation 100% on room air.  Patient is admitted to telemetry bed as inpatient.    Assessment and Plan:  Traumatic rhabdomyolysis Frequent falls. Generalized weakness. Patient presented for evaluation of weakness and had several falls at home prior to admission Rhabdomyolysis present on admission was from the falls. Total CK levels have showed a downward trend Encourage oral fluid intake Patient also seen by PT/OT, recommending nursing home placement.     Hyponatremia Most likely related to SSRI use Stable     Hypokalemia. Hypomagnesemia. No further episodes of diarrhea Supplement electrolytes     Possible complicated UTI (urinary tract infection) History of bladder cancer.:  UA is positive.   Patient has typical symptoms of UTI, but patient has history of bladder cancer, her symptoms are at least partially related to her history of bladder cancer. Initial urine culture was contaminated.  Repeat culture is pending She received 3 days of IV Rocephin  and  will be discharged on 2 more days of oral antibiotic therapy She also received 1 dose of fosfomycin      Hyperlipidemia with target low density lipoprotein (LDL) cholesterol less than 100 mg/dL - Continue Lipitor    Hypothyroidism -Synthroid  50 mcg daily - Increase Synthroid  to 75 mcg daily.    Cerebrovascular disease - Continue Lipitor    Essential hypertension:  - Continue hydralazine  50 mg 3 times daily   Depression with anxiety -Continue Effexor  and hydroxyzine  as needed   RLS (restless legs syndrome) -Requip    Overweight (BMI 25.0-29.9): Body weight 52.6 kg, BMI 25.10 - Encourage losing weight - Exercise healthy diet     Delirium Patient with intermittent episodes of confusion No diagnosis of dementia.  Per baseline she lives at home and able to take care of herself Daughter notes that during their conversation she appeared confused Patient noted to be alert, awake and oriented to person place and time during my assessment Obtained an MRI of the brain without contrast which showed right cerebellar small vessel infarct and old bilateral basal ganglia small vessel infarcts. Early confluent T2 hyperintense white matter signal consistent with chronic small vessel disease.  No acute intracranial findings         Consultants: None Procedures performed: None  Disposition: Skilled nursing facility Diet recommendation:  Cardiac diet DISCHARGE MEDICATION: Allergies as of 03/22/2024       Reactions   Penicillins Rash   Has patient had a PCN reaction causing immediate rash, facial/tongue/throat swelling, SOB or lightheadedness with hypotension: Yes Has patient had a PCN reaction causing severe rash involving mucus membranes or skin necrosis: No Has patient had a PCN reaction that required hospitalization: No Has patient had a PCN reaction occurring within the last 10 years: No If all of the above answers are NO, then may proceed with Cephalosporin use. Occurred as an adult & full body rash Rash Rash Occurred as an adult & full body rash Has patient had a PCN reaction causing immediate rash, facial/tongue/throat swelling, SOB or lightheadedness with hypotension: Yes Has patient had a PCN reaction causing severe rash involving mucus membranes or skin necrosis:  No Has patient had a PCN reaction that required hospitalization: No Has patient had a PCN reaction occurring within the last 10 years: No If all of the above answers are NO, then may proceed with Cephalosporin use. Has patient had a PCN reaction causing immediate rash, facial/tongue/throat swelling, SOB or lightheadedness with hypotension: Yes Has patient had a PCN reaction causing severe rash involving mucus membranes or skin necrosis: No Has patient had a PCN reaction that required hospitalization: No Has patient had a PCN reaction occurring within the last 10 years: No If all of the above answers are NO, then may proceed with Cephalosporin use. Occurred as an adult & full body rash Occurred as an adult & full body rash   Sulfa Antibiotics Swelling, Rash   Facial swelling   Ciprofloxacin  Other (See Comments)   Sulfamethoxazole Rash   Sulfasalazine Swelling, Rash   swelling of face        Medication List     STOP taking these medications    Protonix  40 MG tablet Generic drug: pantoprazole        TAKE these medications    atorvastatin  10 MG tablet Commonly known as: LIPITOR  Take 10 mg by mouth every morning.   cefUROXime 500 MG tablet Commonly known as: CEFTIN Take 1 tablet (500 mg total) by mouth 2 (two) times daily with a  meal for 2 days.   famotidine 40 MG tablet Commonly known as: PEPCID Take 40 mg by mouth at bedtime.   fluticasone  50 MCG/ACT nasal spray Commonly known as: FLONASE  Place 1 spray into both nostrils daily as needed for allergies or rhinitis.   hydrALAZINE  50 MG tablet Commonly known as: APRESOLINE  Take 1 tablet (50 mg total) by mouth every 8 (eight) hours. What changed:  when to take this reasons to take this   hydrOXYzine  10 MG tablet Commonly known as: ATARAX  Take 25 mg by mouth daily.   levothyroxine  75 MCG tablet Commonly known as: SYNTHROID  Take 1 tablet (75 mcg total) by mouth daily at 6 (six) AM. Start taking on: March 23, 2024 What changed:  medication strength how much to take when to take this   loratadine  10 MG tablet Commonly known as: CLARITIN  Take 10 mg by mouth daily.   multivitamin tablet Take 1 tablet by mouth daily.   PROBIOTIC PO Take 1 capsule by mouth daily.   rOPINIRole  0.25 MG tablet Commonly known as: REQUIP  Take 1 tablet (0.25 mg total) by mouth 2 (two) times daily. What changed:  how much to take when to take this   trospium  20 MG tablet Commonly known as: SANCTURA  Take 1 tablet (20 mg total) by mouth 2 (two) times daily.   venlafaxine  75 MG tablet Commonly known as: EFFEXOR  Take 75 mg by mouth daily.   Vitamin D  (Ergocalciferol ) 1.25 MG (50000 UNIT) Caps capsule Commonly known as: DRISDOL  Take 1 capsule (50,000 Units total) by mouth every 7 (seven) days.        Contact information for follow-up providers     [name], Rosaline B, NP Follow up in 1 week(s).   Specialty: Nurse Practitioner Contact information: [address] [place] [address] [phone]              Contact information for after-discharge care     Destination     [facility] .   Service: Skilled Nursing Contact information: [address] [place]  [address] [phone]                    Discharge Exam: Fredricka Weights   03/19/24 1756  Weight: 52.6 kg   General exam: Appears calm and comfortable, frail, weak Respiratory system: Clear to auscultation. Respiratory effort normal. Cardiovascular system: S1 & S2 heard, RRR. No JVD, murmurs, rubs, gallops or clicks. No pedal edema. Gastrointestinal system: Abdomen is nondistended, soft and nontender. No organomegaly or masses felt. Normal bowel sounds heard. Central nervous system: Alert and oriented x3.  No focal neurological deficits. Extremities: Symmetric 5 x 5 power. Skin: No rashes, lesions or ulcers Psychiatry: Judgement and insight appear  normal. Mood & affect appropriate.     Condition at discharge: stable  The results of significant diagnostics from this hospitalization (including imaging, microbiology, ancillary and laboratory) are listed below for reference.   Imaging Studies: MR BRAIN WO CONTRAST Result Date: 03/22/2024 EXAM: MRI BRAIN WITHOUT CONTRAST 03/21/2024 11:57:21 PM TECHNIQUE: Multiplanar multisequence MRI of the head/brain was performed without the administration of intravenous contrast. COMPARISON: 12/06/2019 . CLINICAL HISTORY: Delirium. FINDINGS: BRAIN AND VENTRICLES: No acute infarct. No intracranial hemorrhage. No mass. No midline shift. No hydrocephalus. Early confluent hyperintense T2-weighted signal within the cerebral white matter, most commonly due to chronic small vessel disease. Right cerebellar small vessel infarct. Old bilateral basal ganglia small vessel infarcts. Right MCA stent. The sellar/suprasellar  regions appear unremarkable. Normal flow voids. ORBITS: No acute abnormality. SINUSES AND MASTOIDS: No acute abnormality. BONES AND SOFT TISSUES: Normal marrow signal. No acute soft tissue abnormality. IMPRESSION: 1. No acute intracranial abnormality. 2. Right cerebellar small vessel infarct and old bilateral basal ganglia small vessel infarcts. 3. Early confluent T2 hyperintense white matter signal consistent with chronic small vessel disease. Electronically signed by: Franky Stanford MD 03/22/2024 12:21 AM EDT RP Workstation: [id number]   CT Head Wo Contrast Result Date: 03/19/2024 EXAM: CT HEAD AND CERVICAL SPINE 03/19/2024 08:39:04 PM TECHNIQUE: CT of the head and cervical spine was performed without the administration of intravenous contrast. Multiplanar reformatted images are provided for review. Automated exposure control, iterative reconstruction, and/or weight based adjustment of the mA/kV was utilized to reduce the radiation dose to as low as reasonably achievable. COMPARISON: CT head 08/20/2022  CLINICAL HISTORY: Fall. Pt arrives to ED from home via [facility] with c/c of generalized weakness and multiple falls. EMS reports fall on Thursday and last night. Pt found today on floor at 1515. EMS reports transport vitals of 132/52, p 94, r18, O2 Sat 99%. Pt complaining of upper left leg pain. Denies hitting head or loss of consciousness. At time of triage Pt A\T\Ox4, NAD. Multiple attempts due to pt movement. FINDINGS: CT HEAD BRAIN AND VENTRICLES: No acute intracranial hemorrhage. No mass effect or midline shift. No abnormal extra-axial fluid collection. No evidence of acute infarct. No hydrocephalus. Patchy and confluent areas of decreased attenuation are noted throughout the deep and periventricular white matter of the cerebral hemispheres bilaterally, suggestive of chronic microvascular ischemic changes. Right MCA stent noted. Diffuse cerebral and cerebellar atrophy. Slight limitation of evaluation of the vertex due to streak artifacts and motion artifacts. ORBITS: No acute abnormality. Bilateral lens replacement. SINUSES AND MASTOIDS: No acute abnormality. SOFT TISSUES AND SKULL: No acute skull fracture. No acute soft tissue abnormality. Atherosclerotic plaque of the carotid arteries within the neck. CT CERVICAL SPINE BONES AND ALIGNMENT: No acute fracture or traumatic malalignment. DEGENERATIVE CHANGES: Multilevel mild-to-moderate degenerative changes of the spine. No associated severe osseous neural foraminal or central canal stenosis. SOFT TISSUES: No prevertebral soft tissue swelling. LUNGS: Biapical pleural and pulmonary scarring with associated calcifications. VASCULATURE: Atherosclerotic plaque of the aortic arch and its main branches. IMPRESSION: 1. No acute intracranial abnormality. 2. No acute fracture or traumatic malalignment of the cervical spine. Electronically signed by: Morgane Naveau MD 03/19/2024 08:47 PM EDT RP Workstation: [id number]   CT Cervical Spine Wo Contrast Result Date:  03/19/2024 EXAM: CT HEAD AND CERVICAL SPINE 03/19/2024 08:39:04 PM TECHNIQUE: CT of the head and cervical spine was performed without the administration of intravenous contrast. Multiplanar reformatted images are provided for review. Automated exposure control, iterative reconstruction, and/or weight based adjustment of the mA/kV was utilized to reduce the radiation dose to as low as reasonably achievable. COMPARISON: CT head 08/20/2022 CLINICAL HISTORY: Fall. Pt arrives to ED from home via [facility] with c/c of generalized weakness and multiple falls. EMS reports fall on Thursday and last night. Pt found today on floor at 1515. EMS reports transport vitals of 132/52, p 94, r18, O2 Sat 99%. Pt complaining of upper left leg pain. Denies hitting head or loss of consciousness. At time of triage Pt A\T\Ox4, NAD. Multiple attempts due to pt movement. FINDINGS: CT HEAD BRAIN AND VENTRICLES: No acute intracranial hemorrhage. No mass effect or midline shift. No abnormal extra-axial fluid collection. No evidence of acute infarct. No hydrocephalus. Patchy and confluent areas of  decreased attenuation are noted throughout the deep and periventricular white matter of the cerebral hemispheres bilaterally, suggestive of chronic microvascular ischemic changes. Right MCA stent noted. Diffuse cerebral and cerebellar atrophy. Slight limitation of evaluation of the vertex due to streak artifacts and motion artifacts. ORBITS: No acute abnormality. Bilateral lens replacement. SINUSES AND MASTOIDS: No acute abnormality. SOFT TISSUES AND SKULL: No acute skull fracture. No acute soft tissue abnormality. Atherosclerotic plaque of the carotid arteries within the neck. CT CERVICAL SPINE BONES AND ALIGNMENT: No acute fracture or traumatic malalignment. DEGENERATIVE CHANGES: Multilevel mild-to-moderate degenerative changes of the spine. No associated severe osseous neural foraminal or central canal stenosis. SOFT TISSUES: No prevertebral soft  tissue swelling. LUNGS: Biapical pleural and pulmonary scarring with associated calcifications. VASCULATURE: Atherosclerotic plaque of the aortic arch and its main branches. IMPRESSION: 1. No acute intracranial abnormality. 2. No acute fracture or traumatic malalignment of the cervical spine. Electronically signed by: Morgane Naveau MD 03/19/2024 08:47 PM EDT RP Workstation: [id number]   DG HIP UNILAT WITH PELVIS 2-3 VIEWS RIGHT Result Date: 03/19/2024 CLINICAL DATA:  221993 Right hip pain 221993 EXAM: DG HIP (WITH OR WITHOUT PELVIS) 2-3V RIGHT COMPARISON:  02/09/2015 FINDINGS: Osteopenia.No evidence of pelvic fracture or diastasis. Left hip arthroplasty is well aligned. No acute hip fracture or dislocation.Moderate joint space loss of the right hip. Multilevel degenerative disc disease of the spine. Surgical staple line in the pelvis. Aortoiliac and peripheral vascular atherosclerosis. IMPRESSION: 1. Osteopenia. No acute fracture, pelvic bone diastasis, or dislocation. 2. Moderate osteoarthritis of the right hip. Electronically Signed   By: Rogelia Myers M.D.   On: 03/19/2024 18:50    Microbiology: Results for orders placed or performed during the hospital encounter of 03/19/24  Urine Culture     Status: Abnormal   Collection Time: 03/19/24  8:35 PM   Specimen: Urine, Random  Result Value Ref Range Status   Specimen Description   Final    URINE, RANDOM Performed at [facility], [address] [place], [place] [address]    Special Requests   Final    NONE Reflexed from [id number] Performed at [facility], [address] [place], [place] [address]    Culture MULTIPLE SPECIES PRESENT, SUGGEST RECOLLECTION (A)  Final   Report Status 03/21/2024 FINAL  Final    Labs: CBC: Recent Labs  Lab 03/19/24 1811 03/20/24 0432  WBC 10.1 8.2  HGB 13.2 12.1  HCT 38.0 36.2  MCV 83.5 86.4  PLT 225 218   Basic Metabolic Panel: Recent Labs  Lab 03/20/24 0027 03/20/24 0432  03/20/24 1526 03/20/24 2342 03/21/24 0529 03/22/24 0342  NA 130* 130* 133* 132*  --  129*  K 3.6 3.2* 3.8 3.4*  --  3.8  CL 95* 99 99 99  --  95*  CO2 22 22 18* 25  --  25  GLUCOSE 100* 115* 119* 114*  --  114*  BUN 8 8 11 11   --  12  CREATININE 0.73 0.73 0.86 0.73  --  0.81  CALCIUM  8.8* 8.4* 8.5* 8.1*  --  8.4*  MG 1.5*  --   --   --  2.1  --   PHOS 2.9  --   --   --  3.2  --    Liver Function Tests: Recent Labs  Lab 03/19/24 1811  AST 50*  ALT 22  ALKPHOS 129*  BILITOT 0.9  PROT 6.8  ALBUMIN 3.7   CBG: No results for input(s): GLUCAP in the  last 168 hours.  Discharge time spent: greater than 30 minutes.  Signed: Aimee Somerset, MD [facility] 03/22/2024

## 2024-03-22 NOTE — Progress Notes (Signed)
 Report called to facility

## 2024-03-22 NOTE — Plan of Care (Signed)
  Problem: Clinical Measurements: Goal: Cardiovascular complication will be avoided Outcome: Progressing   Problem: Coping: Goal: Level of anxiety will decrease Outcome: Progressing   Problem: Pain Managment: Goal: General experience of comfort will improve and/or be controlled Outcome: Progressing   Problem: Safety: Goal: Ability to remain free from injury will improve Outcome: Progressing

## 2024-03-22 NOTE — TOC Transition Note (Addendum)
 Transition of Care [facility]) - Discharge Note   Patient Details  Name: Jodi Colon MRN: [id number] Date of Birth: 1929/06/25  [facility] CM/SW Contact:  Daved JONETTA Hamilton, RN Phone Number: 03/22/2024, 3:01 PM   Clinical Narrative:     Patient will DC to: [facility] Anticipated DC date: 03/22/24  Family notified: Grayce- at bedside Transport by: [facility]  Per MD patient ready for DC to . RN, patient, patient's family, and facility notified of DC. Discharge Summary sent to facility. RN given number for report. DC packet on chart.   TOC signing off.   Final next level of care: Skilled Nursing Facility Barriers to Discharge: Barriers Resolved   Patient Goals and CMS Choice            Discharge Placement              Patient chooses bed at: [facility] Patient to be transferred to facility by: [name]z Name of family member notified: Grayce- at bedside Patient and family notified of of transfer: 03/22/24  Discharge Plan and Services Additional resources added to the After Visit Summary for                                       Social Drivers of Health (SDOH) Interventions SDOH Screenings   Food Insecurity: No Food Insecurity (03/19/2024)  Housing: Low Risk  (03/19/2024)  Transportation Needs: No Transportation Needs (03/19/2024)  Utilities: Not At Risk (03/19/2024)  Social Connections: Moderately Isolated (03/19/2024)  Tobacco Use: Low Risk  (03/19/2024)     Readmission Risk Interventions    07/28/2022    9:46 AM  Readmission Risk Prevention Plan  Transportation Screening Complete  PCP or Specialist Appt within 3-5 Days Complete  HRI or Home Care Consult Complete  Medication Review (RN Care Manager) Complete

## 2024-03-22 NOTE — Plan of Care (Signed)

## 2024-03-22 NOTE — Care Management Important Message (Signed)
 Important Message  Patient Details  Name: Jodi Colon MRN: [id number] Date of Birth: 11-12-1929   Important Message Given:  Yes - Medicare IM     [name] 03/22/2024, 1:53 PM

## 2024-03-22 NOTE — Progress Notes (Signed)
 Mobility Specialist - Progress Note   03/22/24 1052  Mobility  Activity Pivoted/transferred from bed to chair  Level of Assistance Minimal assist, patient does 75% or more  Assistive Device  (HHA +2)  Distance Ambulated (ft) 4 ft  Activity Response Tolerated well  Mobility visit 1 Mobility  Mobility Specialist Start Time (ACUTE ONLY) 1044  Mobility Specialist Stop Time (ACUTE ONLY) 1050  Mobility Specialist Time Calculation (min) (ACUTE ONLY) 6 min

## 2024-03-22 NOTE — Progress Notes (Signed)
 Mobility Specialist - Progress Note   03/22/24 1052  Mobility  Activity Pivoted/transferred from bed to chair  Level of Assistance Minimal assist, patient does 75% or more  Assistive Device  (HHA +2)  Distance Ambulated (ft) 4 ft  Activity Response Tolerated well  Mobility visit 1 Mobility  Mobility Specialist Start Time (ACUTE ONLY) 1044  Mobility Specialist Stop Time (ACUTE ONLY) 1050  Mobility Specialist Time Calculation (min) (ACUTE ONLY) 6 min   Pt semi fowler upon entry, utilizing RA. Pt transferred to the recliner MinA-CGA +2 via SPT, tolerated well--- shuffle steps. Pt left seated with alarm set, breakfast tray present and needs within reach.  America Silvan Mobility Specialist 03/22/24 11:06 AM

## 2024-03-22 NOTE — TOC Progression Note (Signed)
 Transition of Care [facility]) - Progression Note    Patient Details  Name: KIMYETTA FLOTT MRN: [id number] Date of Birth: 1929/09/07  Transition of Care [facility]) CM/SW Contact  [name], RN Phone Number: 03/22/2024, 9:53 AM  Clinical Narrative:     Noted shara was not started for SNF yesterday.  Beverly with [facility] [employer] to start auth today.   Received message from MD that daughter Grayce requesting to discuss other SNF options.  Daughter wanted to know if [facility] has a bed available.  Spoke with Massie at [facility] and he confirms no bed availably at this time.  Grayce confirms to proceed with [name] as patient originally requested                     Expected Discharge Plan and Services                                               Social Drivers of Health (SDOH) Interventions SDOH Screenings   Food Insecurity: No Food Insecurity (03/19/2024)  Housing: Low Risk  (03/19/2024)  Transportation Needs: No Transportation Needs (03/19/2024)  Utilities: Not At Risk (03/19/2024)  Social Connections: Moderately Isolated (03/19/2024)  Tobacco Use: Low Risk  (03/19/2024)    Readmission Risk Interventions    07/28/2022    9:46 AM  Readmission Risk Prevention Plan  Transportation Screening Complete  PCP or Specialist Appt within 3-5 Days Complete  HRI or Home Care Consult Complete  Medication Review (RN Care Manager) Complete

## 2024-03-23 LAB — URINE CULTURE

## 2024-03-30 ENCOUNTER — Emergency Department (HOSPITAL_COMMUNITY)

## 2024-03-30 ENCOUNTER — Inpatient Hospital Stay (HOSPITAL_COMMUNITY)
Admission: EM | Admit: 2024-03-30 | Discharge: 2024-04-02 | DRG: 378 | Disposition: A | Discharge status: Skilled Nursing Facility | Source: Skilled Nursing Facility | Attending: Internal Medicine | Admitting: Internal Medicine

## 2024-03-30 ENCOUNTER — Encounter (HOSPITAL_COMMUNITY): Payer: Self-pay | Admitting: Emergency Medicine

## 2024-03-30 ENCOUNTER — Other Ambulatory Visit: Payer: Self-pay

## 2024-03-30 DIAGNOSIS — G2581 Restless legs syndrome: Secondary | ICD-10-CM | POA: Diagnosis present

## 2024-03-30 DIAGNOSIS — Z79890 Hormone replacement therapy (postmenopausal): Secondary | ICD-10-CM

## 2024-03-30 DIAGNOSIS — Z853 Personal history of malignant neoplasm of breast: Secondary | ICD-10-CM

## 2024-03-30 DIAGNOSIS — M8580 Other specified disorders of bone density and structure, unspecified site: Secondary | ICD-10-CM | POA: Diagnosis present

## 2024-03-30 DIAGNOSIS — Z8614 Personal history of Methicillin resistant Staphylococcus aureus infection: Secondary | ICD-10-CM | POA: Diagnosis not present

## 2024-03-30 DIAGNOSIS — Z955 Presence of coronary angioplasty implant and graft: Secondary | ICD-10-CM

## 2024-03-30 DIAGNOSIS — Z66 Do not resuscitate: Secondary | ICD-10-CM | POA: Diagnosis present

## 2024-03-30 DIAGNOSIS — E785 Hyperlipidemia, unspecified: Secondary | ICD-10-CM | POA: Diagnosis present

## 2024-03-30 DIAGNOSIS — Z880 Allergy status to penicillin: Secondary | ICD-10-CM

## 2024-03-30 DIAGNOSIS — F0390 Unspecified dementia without behavioral disturbance: Secondary | ICD-10-CM | POA: Diagnosis present

## 2024-03-30 DIAGNOSIS — K921 Melena: Secondary | ICD-10-CM | POA: Diagnosis present

## 2024-03-30 DIAGNOSIS — K922 Gastrointestinal hemorrhage, unspecified: Principal | ICD-10-CM | POA: Diagnosis present

## 2024-03-30 DIAGNOSIS — E039 Hypothyroidism, unspecified: Secondary | ICD-10-CM | POA: Diagnosis present

## 2024-03-30 DIAGNOSIS — Z923 Personal history of irradiation: Secondary | ICD-10-CM

## 2024-03-30 DIAGNOSIS — Z90710 Acquired absence of both cervix and uterus: Secondary | ICD-10-CM

## 2024-03-30 DIAGNOSIS — Z8551 Personal history of malignant neoplasm of bladder: Secondary | ICD-10-CM | POA: Diagnosis not present

## 2024-03-30 DIAGNOSIS — Z8673 Personal history of transient ischemic attack (TIA), and cerebral infarction without residual deficits: Secondary | ICD-10-CM | POA: Diagnosis not present

## 2024-03-30 DIAGNOSIS — I671 Cerebral aneurysm, nonruptured: Secondary | ICD-10-CM | POA: Diagnosis present

## 2024-03-30 DIAGNOSIS — Z9181 History of falling: Secondary | ICD-10-CM

## 2024-03-30 DIAGNOSIS — I10 Essential (primary) hypertension: Secondary | ICD-10-CM | POA: Diagnosis present

## 2024-03-30 DIAGNOSIS — D62 Acute posthemorrhagic anemia: Secondary | ICD-10-CM | POA: Diagnosis present

## 2024-03-30 DIAGNOSIS — K449 Diaphragmatic hernia without obstruction or gangrene: Secondary | ICD-10-CM | POA: Diagnosis present

## 2024-03-30 DIAGNOSIS — Z96642 Presence of left artificial hip joint: Secondary | ICD-10-CM | POA: Diagnosis present

## 2024-03-30 DIAGNOSIS — K219 Gastro-esophageal reflux disease without esophagitis: Secondary | ICD-10-CM | POA: Diagnosis present

## 2024-03-30 DIAGNOSIS — Z882 Allergy status to sulfonamides status: Secondary | ICD-10-CM | POA: Diagnosis not present

## 2024-03-30 DIAGNOSIS — Z79899 Other long term (current) drug therapy: Secondary | ICD-10-CM

## 2024-03-30 LAB — I-STAT CHEM 8, ED
BUN: 49 mg/dL — ABNORMAL HIGH (ref 8–23)
Calcium, Ion: 1.05 mmol/L — ABNORMAL LOW (ref 1.15–1.40)
Chloride: 99 mmol/L (ref 98–111)
Creatinine, Ser: 0.7 mg/dL (ref 0.44–1.00)
Glucose, Bld: 139 mg/dL — ABNORMAL HIGH (ref 70–99)
HCT: 33 % — ABNORMAL LOW (ref 36.0–46.0)
Hemoglobin: 11.2 g/dL — ABNORMAL LOW (ref 12.0–15.0)
Potassium: 4.8 mmol/L (ref 3.5–5.1)
Sodium: 132 mmol/L — ABNORMAL LOW (ref 135–145)
TCO2: 28 mmol/L (ref 22–32)

## 2024-03-30 LAB — COMPREHENSIVE METABOLIC PANEL WITH GFR
ALT: 26 U/L (ref 0–44)
AST: 23 U/L (ref 15–41)
Albumin: 3 g/dL — ABNORMAL LOW (ref 3.5–5.0)
Alkaline Phosphatase: 98 U/L (ref 38–126)
Anion gap: 14 (ref 5–15)
BUN: 32 mg/dL — ABNORMAL HIGH (ref 8–23)
CO2: 24 mmol/L (ref 22–32)
Calcium: 9.2 mg/dL (ref 8.9–10.3)
Chloride: 97 mmol/L — ABNORMAL LOW (ref 98–111)
Creatinine, Ser: 0.7 mg/dL (ref 0.44–1.00)
GFR, Estimated: >60 mL/min (ref >60)
Glucose, Bld: 127 mg/dL — ABNORMAL HIGH (ref 70–99)
Potassium: 3.3 mmol/L — ABNORMAL LOW (ref 3.5–5.1)
Sodium: 135 mmol/L (ref 135–145)
Total Bilirubin: 0.5 mg/dL (ref 0.0–1.2)
Total Protein: 5.9 g/dL — ABNORMAL LOW (ref 6.5–8.1)

## 2024-03-30 LAB — CBC WITH DIFFERENTIAL/PLATELET
Abs Immature Granulocytes: 0.05 K/uL (ref 0.00–0.07)
Basophils Absolute: 0 K/uL (ref 0.0–0.1)
Basophils Relative: 0 %
Eosinophils Absolute: 0.1 K/uL (ref 0.0–0.5)
Eosinophils Relative: 1 %
HCT: 33.1 % — ABNORMAL LOW (ref 36.0–46.0)
Hemoglobin: 11 g/dL — ABNORMAL LOW (ref 12.0–15.0)
Immature Granulocytes: 1 %
Lymphocytes Relative: 9 %
Lymphs Abs: 1 K/uL (ref 0.7–4.0)
MCH: 29.2 pg (ref 26.0–34.0)
MCHC: 33.2 g/dL (ref 30.0–36.0)
MCV: 87.8 fL (ref 80.0–100.0)
Monocytes Absolute: 0.7 K/uL (ref 0.1–1.0)
Monocytes Relative: 7 %
Neutro Abs: 8.6 K/uL — ABNORMAL HIGH (ref 1.7–7.7)
Neutrophils Relative %: 82 %
Platelets: 288 K/uL (ref 150–400)
RBC: 3.77 MIL/uL — ABNORMAL LOW (ref 3.87–5.11)
RDW: 13.5 % (ref 11.5–15.5)
WBC: 10.4 K/uL (ref 4.0–10.5)
nRBC: 0 % (ref 0.0–0.2)

## 2024-03-30 LAB — POC OCCULT BLOOD, ED: Fecal Occult Bld: POSITIVE — AB

## 2024-03-30 LAB — TROPONIN I (HIGH SENSITIVITY)
Troponin I (High Sensitivity): 30 ng/L — ABNORMAL HIGH (ref <18)
Troponin I (High Sensitivity): 46 ng/L — ABNORMAL HIGH (ref <18)

## 2024-03-30 MED ORDER — PANTOPRAZOLE SODIUM 40 MG IV SOLR
40.0000 mg | Freq: Once | INTRAVENOUS | Status: AC
Start: 2024-03-30 — End: 2024-03-30
  Administered 2024-03-30: 40 mg via INTRAVENOUS
  Filled 2024-03-30: qty 10

## 2024-03-30 MED ORDER — MAGIC MOUTHWASH
5.0000 mL | Freq: Three times a day (TID) | ORAL | Status: DC
  Administered 2024-03-30 – 2024-04-02 (×8): 5 mL via ORAL
  Filled 2024-03-30 (×11): qty 5

## 2024-03-30 MED ORDER — SODIUM CHLORIDE 0.9 % IV SOLN: INTRAVENOUS | Status: DC

## 2024-03-30 MED ORDER — PANTOPRAZOLE SODIUM 40 MG IV SOLR
40.0000 mg | Freq: Two times a day (BID) | INTRAVENOUS | Status: DC
  Administered 2024-03-31 – 2024-04-02 (×6): 40 mg via INTRAVENOUS
  Filled 2024-03-30 (×6): qty 10

## 2024-03-30 MED ORDER — PANTOPRAZOLE SODIUM 40 MG IV SOLR: 40.0000 mg | Freq: Two times a day (BID) | INTRAVENOUS | Status: DC

## 2024-03-30 MED ORDER — LEVOTHYROXINE SODIUM 75 MCG PO TABS
75.0000 ug | ORAL_TABLET | Freq: Every day | ORAL | Status: DC
  Administered 2024-03-31 – 2024-04-02 (×3): 75 ug via ORAL
  Filled 2024-03-30 (×3): qty 1

## 2024-03-30 MED ORDER — ROPINIROLE HCL 0.25 MG PO TABS: 0.2500 mg | ORAL_TABLET | Freq: Two times a day (BID) | ORAL | Status: DC

## 2024-03-30 MED ORDER — ROPINIROLE HCL 0.25 MG PO TABS
0.2500 mg | ORAL_TABLET | Freq: Two times a day (BID) | ORAL | Status: DC
  Administered 2024-03-30 – 2024-04-02 (×6): 0.25 mg via ORAL
  Filled 2024-03-30 (×7): qty 1

## 2024-03-30 NOTE — H&P (Signed)
 History and Physical    Patient: Jodi Colon FMW:[id number] DOB: 1930-02-29 DOA: 03/30/2024 DOS: the patient was seen and examined on 03/30/2024 PCP: Ileen [name], NP  Patient coming from: Home  Chief Complaint: No chief complaint on file.  HPI: Jodi Colon is a 89 y.o. female with medical history significant of bladder cancer diagnosed with high-grade TA TCC of the bladder in 2000 (s/p of BCG and intra bladder chemotherapy), h/o CVA, h/o subarachnoid hemorrhage/brain aneurysm (followed by Neurosurgery), brain aneurysm, CVD s/p PTCA R MCA, HTN, HLD, anemia, cataract, GERD, osteopenia, HLD, RLS, spinal stenosis, thyroid  disease, h/o urinary incontinence, depression with anxiety, hyponatremia, breast cancer (s/p of right lumpectomy and radiation therapy), and recent admission at [facility] from 10/20-23 for recurrent falls and found to have rhabdomyolysis that self resolved c/b hyponatremia (attributed to SSRI use) and old infarcts on MRI who p/w BRBPR (guaic positive) c/f early GIB.  The patient presented with nausea and elevated blood pressure. The nausea began last night and persisted throughout the night, although the patient did not vomit. The patient reported feeling unwell and was unable to recall if breakfast was consumed. The patient's face was observed for drooping by a doctor at her nursing facility, but no drooping was noted upon arrival at the hospital. The patient was sent to the hospital due to concerns about a possible stroke, although no stroke symptoms were observed by the attending medical professionals. Upon arrival, the patient had a large bowel movement that was black in color and guaiac positive, although the patient does not take iron  supplements. The gastrointestinal team has been consulted.  In the ED, pt tachycardic. Labs notable for troponin 30-->46 and Hb 11.2. EDP requested admission for observation given large bloody BM.   Review of Systems: As mentioned in the  history of present illness. All other systems reviewed and are negative. Past Medical History:  Diagnosis Date   Adnexal cyst    Arthritis    Bladder cancer (HCC) 04/2015   Brain aneurysm    small, followed with regular angiograms, Dr. Dolphus   Breast cancer [facility]) 2007   DCIS-Right, s/p lumpectomy/xrt   Cerebrovascular disease 2008   s/p PTCA/stent of MCA, Deveshwar   Chronic kidney disease    Complication of anesthesia    2003- trouble waking up after fracture surgery, no issues with anesthesia since   DDD (degenerative disc disease), cervical    Degenerative joint disease    [facility] by [facility]   GERD (gastroesophageal reflux disease)    Headache    History of hiatal hernia    History of MRSA infection    History of TIA (transient ischemic attack)    Hx MRSA infection    Hyperlipidemia    Hypertension    Hypothyroidism    Incomplete bladder emptying    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)    MRSA (methicillin resistant Staphylococcus aureus) 2008   perineal abscess   Nocturia    Pneumonia    Radiation 2007   BREAST CA   Spinal stenosis    Stroke [facility])    Thyroid  disease    Urge incontinence    Past Surgical History:  Procedure Laterality Date   ABDOMINAL HYSTERECTOMY     AQUEOUS SHUNT Left 01/05/2016   Procedure: AQUEOUS SHUNT;  Surgeon: Donzell Arlyce Budd, MD;  Location: [facility];  Service: Ophthalmology;  Laterality: Left;  AHMED VALVE WITH SCLERAL PATCH GRAFT   AQUEOUS SHUNT Right 07/19/2016  Procedure: AQUEOUS SHUNT;  Surgeon: Donzell Arlyce Budd, MD;  Location: [facility];  Service: Ophthalmology;  Laterality: Right;  Ahmed tube shunt   BACK SURGERY     BLADDER SURGERY  2012, 2014   BRAIN SURGERY Right 2008   stent, Jolynn Pack, [place]   BREAST LUMPECTOMY Right 2007   DCIS, Lumpectomy F/U radiation    CARPAL TUNNEL RELEASE Left 07/2017   CATARACT EXTRACTION     CHOLECYSTECTOMY      COLONOSCOPY  2013   CYSTOSCOPY W/ RETROGRADES Bilateral 05/20/2015   Procedure: CYSTOSCOPY WITH RETROGRADE PYELOGRAM;  Surgeon: Rosina Riis, MD;  Location: [facility];  Service: Urology;  Laterality: Bilateral;   CYSTOSCOPY W/ RETROGRADES Bilateral 12/24/2015   Procedure: CYSTOSCOPY WITH RETROGRADE PYELOGRAM;  Surgeon: Rosina Riis, MD;  Location: [facility];  Service: Urology;  Laterality: Bilateral;   CYSTOSCOPY WITH BIOPSY N/A 05/20/2015   Procedure: CYSTOSCOPY WITH BIOPSY;  Surgeon: Rosina Riis, MD;  Location: [facility];  Service: Urology;  Laterality: N/A;   CYSTOSCOPY WITH BIOPSY N/A 12/24/2015   Procedure: CYSTOSCOPY WITH BIOPSY;  Surgeon: Rosina Riis, MD;  Location: [facility];  Service: Urology;  Laterality: N/A;   EYE SURGERY Bilateral    Cataract Extraction, glaucoma surgery 2018, 2019   FRACTURE SURGERY Left    Ankle, compound fracture   HEMORRHOID SURGERY  02/01/2013   LAPAROSCOPIC HYSTERECTOMY     TONSILLECTOMY     TOTAL HIP ARTHROPLASTY Left 11/08/2017   Procedure: TOTAL HIP ARTHROPLASTY ANTERIOR APPROACH;  Surgeon: Kathlynn Sharper, MD;  Location: [facility];  Service: Orthopedics;  Laterality: Left;   Social History:  reports that she has never smoked. She has never been exposed to tobacco smoke. She has never used smokeless tobacco. She reports that she does not drink alcohol and does not use drugs.  Allergies  Allergen Reactions   Penicillins Rash    Has patient had a PCN reaction causing immediate rash, facial/tongue/throat swelling, SOB or lightheadedness with hypotension: Yes Has patient had a PCN reaction causing severe rash involving mucus membranes or skin necrosis: No Has patient had a PCN reaction that required hospitalization: No Has patient had a PCN reaction occurring within the last 10 years: No If all of the above answers are NO, then may proceed with Cephalosporin use. **Occurred as an adult & full body rash    Sulfa Antibiotics Swelling and Rash     Facial swelling   Ciprofloxacin      Unknown reaction per MAR   Sulfamethoxazole Rash   Sulfasalazine Swelling and Rash    swelling of face    Family History  Problem Relation Age of Onset   Fibromyalgia Daughter    Breast cancer Neg Hx     Prior to Admission medications   Medication Sig Start Date End Date Taking? Authorizing Provider  atorvastatin  (LIPITOR ) 10 MG tablet Take 10 mg by mouth every morning.    Yes [provider]  famotidine (PEPCID) 40 MG tablet Take 40 mg by mouth at bedtime. 02/15/24  Yes [provider]  fluticasone  (FLONASE ) 50 MCG/ACT nasal spray Place 1 spray into both nostrils daily as needed for allergies or rhinitis.   Yes [provider]  hydrALAZINE  (APRESOLINE ) 50 MG tablet Take 1 tablet (50 mg total) by mouth 2 (two) times daily. Patient taking differently: Take 50 mg by mouth 3 (three) times daily. 03/22/24 04/21/24 Yes Agbata, Tochukwu, MD  hydrOXYzine  (ATARAX ) 10 MG tablet Take 25 mg by mouth daily. 04/26/22  Yes [provider]  levothyroxine  (SYNTHROID ) 75 MCG tablet Take 1 tablet (75 mcg total) by mouth daily at 6 (six) AM. 03/23/24 04/22/24 Yes Agbata, Tochukwu, MD  loratadine  (CLARITIN ) 10 MG tablet Take 10 mg by mouth daily.   Yes [provider]  Multiple Vitamin (MULTIVITAMIN) tablet Take 1 tablet by mouth daily.   Yes [provider]  rOPINIRole  (REQUIP ) 0.25 MG tablet Take 1 tablet (0.25 mg total) by mouth 2 (two) times daily. Patient taking differently: Take 0.25 mg by mouth in the morning and at bedtime. 07/31/22  Yes Von Bellis, MD  saccharomyces boulardii (FLORASTOR) 250 MG capsule Take 250 mg by mouth every morning.   Yes [provider]  venlafaxine  (EFFEXOR ) 75 MG tablet Take 75 mg by mouth daily. 01/27/22  Yes [provider]  Vitamin D , Ergocalciferol , (DRISDOL ) 1.25 MG (50000 UNIT) CAPS capsule Take 1 capsule (50,000 Units total) by mouth every 7 (seven) days.  08/04/22  Yes Von Bellis, MD  cefUROXime (CEFTIN) 500 MG tablet Take 500 mg by mouth 2 (two) times daily with a meal. Patient not taking: Reported on 03/30/2024    [provider]  trospium  (SANCTURA ) 20 MG tablet Take 1 tablet (20 mg total) by mouth 2 (two) times daily. Patient not taking: Reported on 03/30/2024 02/16/24   Georganne Penne SAUNDERS, MD    Physical Exam: Vitals:   03/30/24 1126 03/30/24 1128 03/30/24 1200  BP: (!) 130/55  (!) 112/48  Pulse: 98  93  Resp: 19  19  Temp: 98.4 F (36.9 C)    SpO2: 95%  95%  Weight:  52 kg   Height:  4' 9 (1.448 m)    General: Alert, oriented x3, resting comfortably in no acute distress Cardiovascular: Regular rate and rhythm w/o m/r/g Abdomen: Soft, nontender, nondistended. Positive bowel sounds   Data Reviewed:  Lab Results  Component Value Date   WBC 10.4 03/30/2024   HGB 11.2 (L) 03/30/2024   HCT 33.0 (L) 03/30/2024   MCV 87.8 03/30/2024   PLT 288 03/30/2024   Lab Results  Component Value Date   GLUCOSE 127 (H) 03/30/2024   CALCIUM  9.2 03/30/2024   NA 135 03/30/2024   K 3.3 (L) 03/30/2024   CO2 24 03/30/2024   CL 97 (L) 03/30/2024   BUN 32 (H) 03/30/2024   CREATININE 0.70 03/30/2024   Lab Results  Component Value Date   ALT 26 03/30/2024   AST 23 03/30/2024   ALKPHOS 98 03/30/2024   BILITOT 0.5 03/30/2024   Lab Results  Component Value Date   INR 0.95 09/10/2017   INR 0.97 09/07/2017   INR 1.05 08/28/2015   Radiology: CT Head Wo Contrast Result Date: 03/30/2024 EXAM: CT HEAD WITHOUT CONTRAST 03/30/2024 12:13:00 PM TECHNIQUE: CT of the head was performed without the administration of intravenous contrast. Automated exposure control, iterative reconstruction, and/or weight based adjustment of the mA/kV was utilized to reduce the radiation dose to as low as reasonably achievable. COMPARISON: CT of the head dated 03/19/2024. CLINICAL HISTORY: Mental status change, unknown cause. FINDINGS: BRAIN AND  VENTRICLES: No acute hemorrhage. No evidence of acute infarct. Moderate generalized volume loss. Moderate periventricular and deep cerebral white matter disease. Chronic lacunar infarcts present within the caudate nuclei bilaterally. Chronic lacunar infarct is also noted within the right thalamus. A stent is present within the right middle cerebral artery. No hydrocephalus. No extra-axial collection. No mass effect or midline shift. The study is mildly degraded by patient motion but is significantly improved  relative to the previous study. ORBITS: The patient is status post bilateral lens replacement. No acute abnormality. SINUSES: No acute abnormality. SOFT TISSUES AND SKULL: No acute soft tissue abnormality. No skull fracture. IMPRESSION: 1. No acute intracranial abnormality. 2. Moderate generalized volume loss and moderate periventricular and deep cerebral white matter disease. 3. Chronic lacunar infarcts in the caudate nuclei bilaterally and right thalamus. 4. Right middle cerebral artery stent. Electronically signed by: Evalene Coho MD 03/30/2024 12:27 PM EDT RP Workstation: [id number]   DG Chest Port 1 View Result Date: 03/30/2024 EXAM: 1 VIEW(S) XRAY OF THE CHEST 03/30/2024 11:50:00 AM COMPARISON: None available. CLINICAL HISTORY: chest pain FINDINGS: LUNGS AND PLEURA: Mild chronic coarsened interstitial markings without pulmonary edema. No focal pulmonary opacity. No pleural effusion. No pneumothorax. HEART AND MEDIASTINUM: Aortic calcification. BONES AND SOFT TISSUES: No acute osseous abnormality. IMPRESSION: 1. No acute cardiopulmonary process. 2. Chronic coarsened interstitial markings without pulmonary edema. Electronically signed by: Norleen Boxer MD 03/30/2024 12:20 PM EDT RP Workstation: [id number]    Assessment and Plan: 25F h/o bladder cancer diagnosed with high-grade TA TCC of the bladder in 2000 (s/p of BCG and intra bladder chemotherapy), h/o CVA, h/o subarachnoid hemorrhage/brain  aneurysm (followed by Neurosurgery), brain aneurysm, CVD s/p PTCA R MCA, HTN, HLD, anemia, cataract, GERD, osteopenia, HLD, RLS, spinal stenosis, thyroid  disease, h/o urinary incontinence, depression with anxiety, hyponatremia, breast cancer (s/p of right lumpectomy and radiation therapy), and recent admission at [facility] from 10/20-23 for recurrent falls and found to have rhabdomyolysis that self resolved c/b hyponatremia (attributed to SSRI use) and old infarcts on MRI who p/w BRBPR (guaic positive) c/f early GIB.  GIB -GI consulted; apprec eval/recs -2 large PIVs; f/u CBC q8h, goal Hb >7, transfuse prn; IV PPI BID; MIVF w/ LR 100cc/h x 24h; HOLD OAC and antiplatelet agents  -F/u haptoglobin  Hypothyroid -PTA Synthroid  75mcg daily  RLS -PTA ropinirole    Advance Care Planning:   Code Status: Prior   Consults: GI  Family Communication: Daughter  Severity of Illness: The appropriate patient status for this patient is INPATIENT. Inpatient status is judged to be reasonable and necessary in order to provide the required intensity of service to ensure the patient's safety. The patient's presenting symptoms, physical exam findings, and initial radiographic and laboratory data in the context of their chronic comorbidities is felt to place them at high risk for further clinical deterioration. Furthermore, it is not anticipated that the patient will be medically stable for discharge from the hospital within 2 midnights of admission.   * I certify that at the point of admission it is my clinical judgment that the patient will require inpatient hospital care spanning beyond 2 midnights from the point of admission due to high intensity of service, high risk for further deterioration and high frequency of surveillance required.*   ------- I spent 57 minutes reviewing previous notes, at the bedside counseling/discussing the treatment plan, and performing clinical documentation.  Author: Marsha Ada,  MD 03/30/2024 2:47 PM  For on call review [email or web address].

## 2024-03-30 NOTE — ED Notes (Signed)
 Help get patient cleaned up placed another brief patient is resting with famikly at bedside and call bell in reach

## 2024-03-30 NOTE — ED Provider Notes (Signed)
 [place] EMERGENCY DEPARTMENT AT [facility] Provider Note   CSN: [id number] Arrival date & time: 03/30/24  1112     Patient presents with: No chief complaint on file.   Jodi Colon is a 89 y.o. female.   89 year old female presents from facility due to concern for left-sided facial droop.  Patient at baseline has dementia with some slurred speech.  No other focal weakness was noted.  Patient is normally alert and oriented x 2.  States that she has had constant pain in her chest since yesterday.  Unable to describe the character of the pain.  Notes that she has not been short of breath.  No fever or cough.  Denies any headache currently.  EMS was called and patient's blood sugar over 100 and transported here       Prior to Admission medications   Medication Sig Start Date End Date Taking? Authorizing Provider  atorvastatin  (LIPITOR ) 10 MG tablet Take 10 mg by mouth every morning.     [provider]  famotidine (PEPCID) 40 MG tablet Take 40 mg by mouth at bedtime. 02/15/24   [provider]  fluticasone  (FLONASE ) 50 MCG/ACT nasal spray Place 1 spray into both nostrils daily as needed for allergies or rhinitis.    [provider]  hydrALAZINE  (APRESOLINE ) 50 MG tablet Take 1 tablet (50 mg total) by mouth 2 (two) times daily. 03/22/24 04/21/24  Lanetta Lingo, MD  hydrOXYzine  (ATARAX ) 10 MG tablet Take 25 mg by mouth daily. 04/26/22   [provider]  levothyroxine  (SYNTHROID ) 75 MCG tablet Take 1 tablet (75 mcg total) by mouth daily at 6 (six) AM. 03/23/24 04/22/24  Agbata, Tochukwu, MD  loratadine  (CLARITIN ) 10 MG tablet Take 10 mg by mouth daily.    [provider]  Multiple Vitamin (MULTIVITAMIN) tablet Take 1 tablet by mouth daily.    [provider]  Probiotic Product (PROBIOTIC PO) Take 1 capsule by mouth daily.    [provider]  rOPINIRole  (REQUIP ) 0.25 MG tablet Take 1 tablet (0.25 mg total) by  mouth 2 (two) times daily. Patient taking differently: Take 3 mg by mouth at bedtime. 07/31/22   Von Bellis, MD  trospium  (SANCTURA ) 20 MG tablet Take 1 tablet (20 mg total) by mouth 2 (two) times daily. 02/16/24   Georganne Penne SAUNDERS, MD  venlafaxine  (EFFEXOR ) 75 MG tablet Take 75 mg by mouth daily. 01/27/22   [provider]  Vitamin D , Ergocalciferol , (DRISDOL ) 1.25 MG (50000 UNIT) CAPS capsule Take 1 capsule (50,000 Units total) by mouth every 7 (seven) days. 08/04/22   Von Bellis, MD    Allergies: Penicillins, Sulfa antibiotics, Ciprofloxacin , Sulfamethoxazole, and Sulfasalazine    Review of Systems  All other systems reviewed and are negative.   Updated Vital Signs There were no vitals taken for this visit.  Physical Exam Vitals and nursing note reviewed.  Constitutional:      General: She is not in acute distress.    Appearance: Normal appearance. She is well-developed. She is not toxic-appearing.  HENT:     Head: Normocephalic and atraumatic.  Eyes:     General: Lids are normal.     Conjunctiva/sclera: Conjunctivae normal.     Pupils: Pupils are equal, round, and reactive to light.  Neck:     Thyroid : No thyroid  mass.     Trachea: No tracheal deviation.  Cardiovascular:     Rate and Rhythm: Normal rate and regular rhythm.     Heart  sounds: Normal heart sounds. No murmur heard.    No gallop.  Pulmonary:     Effort: Pulmonary effort is normal. No respiratory distress.     Breath sounds: Normal breath sounds. No stridor. No decreased breath sounds, wheezing, rhonchi or rales.  Abdominal:     General: There is no distension.     Palpations: Abdomen is soft.     Tenderness: There is no abdominal tenderness. There is no rebound.  Musculoskeletal:        General: No tenderness. Normal range of motion.     Cervical back: Normal range of motion and neck supple.  Skin:    General: Skin is warm and dry.     Findings: No abrasion or rash.  Neurological:     General:  No focal deficit present.     Mental Status: She is alert. Mental status is at baseline. She is disoriented.     GCS: GCS eye subscore is 4. GCS verbal subscore is 5. GCS motor subscore is 6.     Cranial Nerves: No cranial nerve deficit or dysarthria.     Sensory: No sensory deficit.     Motor: Motor function is intact. No weakness or tremor.     Comments: Speech is normal  Psychiatric:        Attention and Perception: Attention normal.        Speech: Speech normal.        Behavior: Behavior normal.     (all labs ordered are listed, but only abnormal results are displayed) Labs Reviewed  CBC WITH DIFFERENTIAL/PLATELET  COMPREHENSIVE METABOLIC PANEL WITH GFR  URINALYSIS, W/ REFLEX TO CULTURE (INFECTION SUSPECTED)  I-STAT CHEM 8, ED  TROPONIN I (HIGH SENSITIVITY)    EKG: EKG Interpretation Date/Time:  Friday March 30 2024 11:19:49 EDT Ventricular Rate:  110 PR Interval:  150 QRS Duration:  123 QT Interval:  361 QTC Calculation: 489 R Axis:   -67  Text Interpretation: Fast sinus arrhythmia Right bundle branch block LVH with IVCD and secondary repol abnrm Borderline prolonged QT interval Confirmed by Dasie Faden ([id number]) on 03/30/2024 11:25:07 AM  Radiology: No results found.   Procedures   Medications Ordered in the ED - No data to display                                  Medical Decision Making Amount and/or Complexity of Data Reviewed Labs: ordered. Radiology: ordered. ECG/medicine tests: ordered.  Risk Prescription drug management.   Patient had large volume black stool that was guaiac positive.  She denies any recent hematemesis.  Not had any abdominal pain.  She does not take any blood thinners.    Patient started on Protonix .  Patient to be globin is 11.  Does have elevated BUN.  Concern for possible upper GI bleed will need to be admitted for observation     Final diagnoses:  None    ED Discharge Orders     None          Dasie Faden,  MD 03/30/24 1401

## 2024-03-30 NOTE — ED Triage Notes (Addendum)
 Pt BIB [facility] from [facility] due to stroke like symptoms. NP was making rounds this morning at facility and wanted her sent out for left sided facial droop.  Pt does have dried blood around mouth, unable to get reason from facility. 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 AOX2 baseline.  Hx dementia & stroke.  20g left forearm. VS BP 164/68, HR 110, CBG 171, SpO2 96%RA

## 2024-03-30 NOTE — Consult Note (Addendum)
 Consultation  Referring Provider:  [facility]  Primary Care Physician:  Ileen [name], NP Primary Gastroenterologist:  [facility]       Reason for Consultation: Melena       LOS: 0 days          HPI:   Jodi Colon is a 89 y.o. female with past medical history significant for female with medical history significant of bladder cancer diagnosed with high-grade TA TCC of the bladder in 2000 (s/p of BCG and intra bladder chemotherapy), h/o CVA, h/o subarachnoid hemorrhage/brain aneurysm (followed by Neurosurgery), brain aneurysm, CVD s/p PTCA R MCA, HTN, HLD, anemia, cataract, GERD, osteopenia, HLD, RLS, spinal stenosis, thyroid  disease, h/o urinary incontinence, depression with anxiety, hyponatremia, breast cancer (s/p of right lumpectomy and radiation therapy), and recent admission at [facility] from 10/20-23 for recurrent falls and found to have rhabdomyolysis that self resolved c/b hyponatremia (attributed to SSRI use) and old infarcts on MRI  presents for evaluation of melena.  [facility] seen April 2024.  At that time was having dysphagia and GERD and recommended barium swallow/upper GI series but patient declined.  Patient has a known large hiatal hernia with paraesophageal component.  Patient presented to from SNF (she resides there due to a recent fall 2 weeks ago in which she was down for 14 hours). She came to ED due to hypertension noted at SNF but on arrival BP was 112/48. While in ED patient had large tarry BM. She also is noted to have black dried blood in her mouth. Patient reports nausea but no vomiting. Reports use of tums. No pepto.  Upon arrival patient is hemodynamically stable.  She does have anemia with Hgb 11.0 (down from 12.1 - 10 days ago).  BUN 32, CR 0.70   PREVIOUS GI WORKUP   Barium Swallow 06/12/2019: IMPRESSION:  1. Moderate hiatal hernia with paraesophageal component, increased  since 2015 esophagram.  2. Marked gastroesophageal reflux  elicited.  3. No evidence of reflux esophagitis. No evidence of esophageal  mass, stricture or ulcer.  4. Normal oral and pharyngeal phase of swallowing.   CT abd/pelvis without contrast 07/27/2022: IMPRESSION:  1. Large gastric hernia.  2. Sigmoid diverticulosis.  3. 2 mm nonobstructing left renal calculus.  4. Marked severity scoliosis of the lumbar spine with marked  severity multilevel degenerative changes.  5. Aortic atherosclerosis.   Sigmoidoscopy 03/2011: hemorrhoids, otherwise normal.  Past Medical History:  Diagnosis Date   Adnexal cyst    Arthritis    Bladder cancer (HCC) 04/2015   Brain aneurysm    small, followed with regular angiograms, Dr. Dolphus   Breast cancer [facility]) 2007   DCIS-Right, s/p lumpectomy/xrt   Cerebrovascular disease 2008   s/p PTCA/stent of MCA, Deveshwar   Chronic kidney disease    Complication of anesthesia    2003- trouble waking up after fracture surgery, no issues with anesthesia since   DDD (degenerative disc disease), cervical    Degenerative joint disease    [facility]   GERD (gastroesophageal reflux disease)    Headache    History of hiatal hernia    History of MRSA infection    History of TIA (transient ischemic attack)    Hx MRSA infection    Hyperlipidemia    Hypertension    Hypothyroidism    Incomplete bladder emptying    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)    MRSA (methicillin  resistant Staphylococcus aureus) 2008   perineal abscess   Nocturia    Pneumonia    Radiation 2007   BREAST CA   Spinal stenosis    Stroke [facility])    Thyroid  disease    Urge incontinence     Surgical History:  She  has a past surgical history that includes Cholecystectomy; Colonoscopy (2013); Hemorrhoid surgery (02/01/2013); Cataract extraction; Tonsillectomy; Laparoscopic hysterectomy; Abdominal hysterectomy; Back surgery; Fracture surgery (Left); Bladder surgery (2012, 2014); Brain surgery (Right,  2008); Cystoscopy with biopsy (N/A, 05/20/2015); Cystoscopy w/ retrogrades (Bilateral, 05/20/2015); Cystoscopy w/ retrogrades (Bilateral, 12/24/2015); Cystoscopy with biopsy (N/A, 12/24/2015); Aqueous shunt (Left, 01/05/2016); Aqueous shunt (Right, 07/19/2016); Breast lumpectomy (Right, 2007); Eye surgery (Bilateral); Carpal tunnel release (Left, 07/2017); and Total hip arthroplasty (Left, 11/08/2017). Family History:  Her family history includes Fibromyalgia in her daughter. Social History:   reports that she has never smoked. She has never been exposed to tobacco smoke. She has never used smokeless tobacco. She reports that she does not drink alcohol and does not use drugs.  Prior to Admission medications   Medication Sig Start Date End Date Taking? Authorizing Provider  atorvastatin  (LIPITOR ) 10 MG tablet Take 10 mg by mouth every morning.    Yes [provider]  famotidine (PEPCID) 40 MG tablet Take 40 mg by mouth at bedtime. 02/15/24  Yes [provider]  fluticasone  (FLONASE ) 50 MCG/ACT nasal spray Place 1 spray into both nostrils daily as needed for allergies or rhinitis.   Yes [provider]  hydrALAZINE  (APRESOLINE ) 50 MG tablet Take 1 tablet (50 mg total) by mouth 2 (two) times daily. Patient taking differently: Take 50 mg by mouth 3 (three) times daily. 03/22/24 04/21/24 Yes Agbata, Tochukwu, MD  hydrOXYzine  (ATARAX ) 10 MG tablet Take 25 mg by mouth daily. 04/26/22  Yes [provider]  levothyroxine  (SYNTHROID ) 75 MCG tablet Take 1 tablet (75 mcg total) by mouth daily at 6 (six) AM. 03/23/24 04/22/24 Yes Agbata, Tochukwu, MD  loratadine  (CLARITIN ) 10 MG tablet Take 10 mg by mouth daily.   Yes [provider]  Multiple Vitamin (MULTIVITAMIN) tablet Take 1 tablet by mouth daily.   Yes [provider]  rOPINIRole  (REQUIP ) 0.25 MG tablet Take 1 tablet (0.25 mg total) by mouth 2 (two) times daily. Patient taking differently: Take 0.25 mg  by mouth in the morning and at bedtime. 07/31/22  Yes Von Bellis, MD  saccharomyces boulardii (FLORASTOR) 250 MG capsule Take 250 mg by mouth every morning.   Yes [provider]  venlafaxine  (EFFEXOR ) 75 MG tablet Take 75 mg by mouth daily. 01/27/22  Yes [provider]  Vitamin D , Ergocalciferol , (DRISDOL ) 1.25 MG (50000 UNIT) CAPS capsule Take 1 capsule (50,000 Units total) by mouth every 7 (seven) days. 08/04/22  Yes Von Bellis, MD  cefUROXime (CEFTIN) 500 MG tablet Take 500 mg by mouth 2 (two) times daily with a meal. Patient not taking: Reported on 03/30/2024    [provider]  trospium  (SANCTURA ) 20 MG tablet Take 1 tablet (20 mg total) by mouth 2 (two) times daily. Patient not taking: Reported on 03/30/2024 02/16/24   Georganne Penne SAUNDERS, MD    Current Facility-Administered Medications  Medication Dose Route Frequency Provider Last Rate Last Admin   0.9 %  sodium chloride  infusion   Intravenous Continuous Georgina Basket, MD       [START ON 03/31/2024] levothyroxine  (SYNTHROID ) tablet 75 mcg  75 mcg Oral Q0600 Georgina Basket, MD       [  START ON 03/31/2024] pantoprazole  (PROTONIX ) injection 40 mg  40 mg Intravenous Q12H Georgina Basket, MD       NOREEN ON 03/31/2024] rOPINIRole  (REQUIP ) tablet 0.25 mg  0.25 mg Oral BID Georgina Basket, MD       Current Outpatient Medications  Medication Sig Dispense Refill   atorvastatin  (LIPITOR ) 10 MG tablet Take 10 mg by mouth every morning.      famotidine (PEPCID) 40 MG tablet Take 40 mg by mouth at bedtime.     fluticasone  (FLONASE ) 50 MCG/ACT nasal spray Place 1 spray into both nostrils daily as needed for allergies or rhinitis.     hydrALAZINE  (APRESOLINE ) 50 MG tablet Take 1 tablet (50 mg total) by mouth 2 (two) times daily. (Patient taking differently: Take 50 mg by mouth 3 (three) times daily.) 90 tablet 0   hydrOXYzine  (ATARAX ) 10 MG tablet Take 25 mg by mouth daily.     levothyroxine  (SYNTHROID ) 75 MCG tablet Take 1 tablet  (75 mcg total) by mouth daily at 6 (six) AM. 30 tablet 0   loratadine  (CLARITIN ) 10 MG tablet Take 10 mg by mouth daily.     Multiple Vitamin (MULTIVITAMIN) tablet Take 1 tablet by mouth daily.     rOPINIRole  (REQUIP ) 0.25 MG tablet Take 1 tablet (0.25 mg total) by mouth 2 (two) times daily. (Patient taking differently: Take 0.25 mg by mouth in the morning and at bedtime.)     saccharomyces boulardii (FLORASTOR) 250 MG capsule Take 250 mg by mouth every morning.     venlafaxine  (EFFEXOR ) 75 MG tablet Take 75 mg by mouth daily.     Vitamin D , Ergocalciferol , (DRISDOL ) 1.25 MG (50000 UNIT) CAPS capsule Take 1 capsule (50,000 Units total) by mouth every 7 (seven) days. 5 capsule    cefUROXime (CEFTIN) 500 MG tablet Take 500 mg by mouth 2 (two) times daily with a meal. (Patient not taking: Reported on 03/30/2024)     trospium  (SANCTURA ) 20 MG tablet Take 1 tablet (20 mg total) by mouth 2 (two) times daily. (Patient not taking: Reported on 03/30/2024) 180 tablet 3   Facility-Administered Medications Ordered in Other Encounters  Medication Dose Route Frequency Provider Last Rate Last Admin   gemcitabine  (GEMZAR ) 2,000 mg  2,000 mg Intravenous Once Finnegan, Timothy J, MD       opium -belladonna (B&O SUPPRETTES) 16.2-30 MG suppository 30 mg  30 mg Rectal Once Brandon, Ashley, MD       opium -belladonna (B&O SUPPRETTES) 16.2-30 MG suppository 30 mg  30 mg Rectal Once Finnegan, Timothy J, MD       opium -belladonna (B&O SUPPRETTES) 16.2-30 MG suppository 30 mg  30 mg Rectal Once Finnegan, Timothy J, MD       White Petrolatum  OINT 1 application  1 application  Apply externally Once Finnegan, Timothy J, MD        Allergies as of 03/30/2024 - Review Complete 03/30/2024  Allergen Reaction Noted   Penicillins Rash 09/08/2012   Sulfa antibiotics Swelling and Rash 04/02/2011   Ciprofloxacin   02/15/2023   Sulfamethoxazole Rash 09/21/2022   Sulfasalazine Swelling and Rash 12/10/2013    Review of Systems   Constitutional:  Negative for chills, fever and weight loss.  HENT:  Negative for hearing loss and tinnitus.   Eyes:  Negative for blurred vision and double vision.  Respiratory:  Negative for cough and hemoptysis.   Cardiovascular:  Negative for chest pain and palpitations.  Gastrointestinal:  Positive for melena. Negative for abdominal pain, blood in stool, constipation,  diarrhea, heartburn, nausea and vomiting.  Genitourinary:  Negative for dysuria and urgency.  Musculoskeletal:  Negative for myalgias and neck pain.  Skin:  Negative for itching and rash.  Neurological:  Negative for seizures and loss of consciousness.  Psychiatric/Behavioral:  Negative for depression and suicidal ideas.        Physical Exam:  Vital signs in last 24 hours: Temp:  [98.4 F (36.9 C)-98.7 F (37.1 C)] 98.7 F (37.1 C) (10/31 1515) Pulse Rate:  [93-98] 93 (10/31 1200) Resp:  [19] 19 (10/31 1200) BP: (112-130)/(48-55) 112/48 (10/31 1200) SpO2:  [95 %] 95 % (10/31 1200) Weight:  [52 kg] 52 kg (10/31 1128)   Last BM recorded by nurses in past 5 days No data recorded  Physical Exam Constitutional:      General: She is not in acute distress.    Appearance: She is ill-appearing.     Comments: Frail elderly  HENT:     Nose: Nose normal. No congestion.     Mouth/Throat:     Mouth: Mucous membranes are dry.     Comments: Dry with black dried blood on roof of mouth and on tongue as well as around lips Eyes:     General: No scleral icterus.    Extraocular Movements: Extraocular movements intact.  Cardiovascular:     Rate and Rhythm: Regular rhythm.     Comments: Slightly tachycardic Pulmonary:     Effort: Pulmonary effort is normal.  Abdominal:     General: Bowel sounds are normal. There is no distension.     Palpations: Abdomen is soft. There is no mass.     Tenderness: There is no abdominal tenderness. There is no guarding.     Hernia: No hernia is present.  Musculoskeletal:        General:  No swelling. Normal range of motion.  Skin:    General: Skin is warm and dry.     Coloration: Skin is pale.  Neurological:     General: No focal deficit present.     Mental Status: She is oriented to person, place, and time.  Psychiatric:        Mood and Affect: Mood normal.        Behavior: Behavior normal.        Thought Content: Thought content normal.        Judgment: Judgment normal.      LAB RESULTS: Recent Labs    03/30/24 1123 03/30/24 1146  WBC 10.4  --   HGB 11.0* 11.2*  HCT 33.1* 33.0*  PLT 288  --    BMET Recent Labs    03/30/24 1146 03/30/24 1323  NA 132* 135  K 4.8 3.3*  CL 99 97*  CO2  --  24  GLUCOSE 139* 127*  BUN 49* 32*  CREATININE 0.70 0.70  CALCIUM   --  9.2   LFT Recent Labs    03/30/24 1323  PROT 5.9*  ALBUMIN 3.0*  AST 23  ALT 26  ALKPHOS 98  BILITOT 0.5   PT/INR No results for input(s): LABPROT, INR in the last 72 hours.  STUDIES: CT Head Wo Contrast Result Date: 03/30/2024 EXAM: CT HEAD WITHOUT CONTRAST 03/30/2024 12:13:00 PM TECHNIQUE: CT of the head was performed without the administration of intravenous contrast. Automated exposure control, iterative reconstruction, and/or weight based adjustment of the mA/kV was utilized to reduce the radiation dose to as low as reasonably achievable. COMPARISON: CT of the head dated 03/19/2024. CLINICAL HISTORY: Mental status change,  unknown cause. FINDINGS: BRAIN AND VENTRICLES: No acute hemorrhage. No evidence of acute infarct. Moderate generalized volume loss. Moderate periventricular and deep cerebral white matter disease. Chronic lacunar infarcts present within the caudate nuclei bilaterally. Chronic lacunar infarct is also noted within the right thalamus. A stent is present within the right middle cerebral artery. No hydrocephalus. No extra-axial collection. No mass effect or midline shift. The study is mildly degraded by patient motion but is significantly improved relative to the  previous study. ORBITS: The patient is status post bilateral lens replacement. No acute abnormality. SINUSES: No acute abnormality. SOFT TISSUES AND SKULL: No acute soft tissue abnormality. No skull fracture. IMPRESSION: 1. No acute intracranial abnormality. 2. Moderate generalized volume loss and moderate periventricular and deep cerebral white matter disease. 3. Chronic lacunar infarcts in the caudate nuclei bilaterally and right thalamus. 4. Right middle cerebral artery stent. Electronically signed by: Evalene Coho MD 03/30/2024 12:27 PM EDT RP Workstation: [id number]   DG Chest Port 1 View Result Date: 03/30/2024 EXAM: 1 VIEW(S) XRAY OF THE CHEST 03/30/2024 11:50:00 AM COMPARISON: None available. CLINICAL HISTORY: chest pain FINDINGS: LUNGS AND PLEURA: Mild chronic coarsened interstitial markings without pulmonary edema. No focal pulmonary opacity. No pleural effusion. No pneumothorax. HEART AND MEDIASTINUM: Aortic calcification. BONES AND SOFT TISSUES: No acute osseous abnormality. IMPRESSION: 1. No acute cardiopulmonary process. 2. Chronic coarsened interstitial markings without pulmonary edema. Electronically signed by: Norleen Boxer MD 03/30/2024 12:20 PM EDT RP Workstation: [id number]      Impression    89 y.o. female with past medical history significant for female with medical history significant of bladder cancer diagnosed with high-grade TA TCC of the bladder in 2000 (s/p of BCG and intra bladder chemotherapy), h/o CVA, h/o subarachnoid hemorrhage/brain aneurysm (followed by Neurosurgery), brain aneurysm, CVD s/p PTCA R MCA, HTN, HLD, anemia, cataract, GERD, osteopenia, HLD, RLS, spinal stenosis, thyroid  disease, h/o urinary incontinence, depression with anxiety, hyponatremia, breast cancer (s/p of right lumpectomy and radiation therapy), and recent admission at [facility] from 10/20-23 for recurrent falls and found to have rhabdomyolysis that self resolved c/b hyponatremia (attributed to SSRI  use) and old infarcts on MRI who currently resides in SNF presents for evaluation of melena.  Melena Acute on chronic anemia Hx of large hiatal hernia with paraesophageal component CTAP without contrast 07/2022 with large hiatal hernia, diverticulosis Sigmoidoscopy 2012 with hemorrhoids, otherwise unrevealing Hgb 11.0 (down from 12.1 - 10 days ago).   BUN 32, CR 0.70 Barium swallow 2021 with GERD and moderate hiatal hernia No NSAIDS With elevated BUN, drop in hgb, and obvious upper GI bleeding it is likely patient may have camerons erosions from her known large hiatal hernia.denies dysphagia but appears has struggled with it in the past without previous EGD. Other DDX includes PUD, esophagitis, gastritis.  Daughters and patient are concerned about anesthesia not only with her age but because she has reportedly difficult to wake up.  Reassuringly patient is hemodynamically stable and her hemoglobin is 11.0.  Recurrent falls Recent admit 03/19/24 with negative head CT and found to have rhabdomyolysis that self resolved. Negative head CT.  Breast cancer S/p right lumpectomy and radiation therapy  History of CVA History of SAH Followed by neurosurgery. S/p right middle cerebral artery stent  high-grade TA TCC of the bladder in 2000 (s/p of BCG and intra bladder chemotherapy)   Plan   - Continue daily CBC and transfuse as needed to maintain HGB > 7 - PPI IV twice daily - Will defer decision for  EGD up to Dr. Stacia and family.  Patient has multiple comorbidities as above that would make EGD high risk.   Thank you for your kind consultation, we will continue to follow.   Bayley CHRISTELLA Blower  03/30/2024, 3:22 PM   ------------------------------------------------------------------------------------------------------------  I have taken a history, reviewed the chart and examined the patient. I performed a substantive portion of this encounter, including complete performance of at  least one of the key components, in conjunction with the APP. I agree with the APP's note, impression and recommendations.  89 year old female with history of bladder cancer, stroke/subarachnoid hemorrhage and bladder cancer to the ED from her SNF when she was found to have a left-sided facial droop and slurred speech at her SNF.  CT head negative for evidence of stroke, but while in the ED she had a large melenic bowel movement.  In addition she was noted to have dried blood in her mouth and around her lips.  She denies any hematemesis.   Hemoglobin was 11, down from baseline 12-13.  BUN elevated at 49. She was admitted for concern for possible GI bleed.  She has a known large paraesophageal hernia.  She had been having symptoms of chest pain and dyspepsia last night, but currently denies any symptoms.  She has not had any further bowel movements and no episodes of emesis.  I think that it is likely that the patient has had an upper GI bleed.  Would be suspicious for a Cameron's lesion given her history of paraesophageal hernia, versus bleeding from esophagitis.  Not clear if it has resolved spontaneously or his ongoing, but does not appear to be briskly bleeding.  She is hemodynamically stable.  I had a discussion with her and her 2 daughters at bedside about whether to proceed with an upper endoscopy.  We all agree that given her age and comorbidities, conservative management with medical therapy is probably the best course of action.  Should the patient have more significant bleeding despite medical therapy, then the benefits of an upper endoscopy for hemostasis may outweigh the risk.  For now, would recommend continued supportive care with IV PPI.  Clear liquid diet okay from GI standpoint. If hemoglobin stable overnight and no further overt bleeding, can consider discharge tomorrow.  Leonetta Mcgivern E. Stacia, MD [facility]

## 2024-03-30 NOTE — ED Notes (Signed)
 CCMD called by this RN

## 2024-03-30 NOTE — Plan of Care (Signed)

## 2024-03-31 DIAGNOSIS — K449 Diaphragmatic hernia without obstruction or gangrene: Secondary | ICD-10-CM | POA: Diagnosis not present

## 2024-03-31 DIAGNOSIS — K921 Melena: Secondary | ICD-10-CM | POA: Diagnosis not present

## 2024-03-31 DIAGNOSIS — Z8673 Personal history of transient ischemic attack (TIA), and cerebral infarction without residual deficits: Secondary | ICD-10-CM | POA: Diagnosis not present

## 2024-03-31 DIAGNOSIS — D62 Acute posthemorrhagic anemia: Secondary | ICD-10-CM | POA: Diagnosis not present

## 2024-03-31 LAB — BASIC METABOLIC PANEL WITH GFR
Anion gap: 10 (ref 5–15)
Anion gap: 12 (ref 5–15)
BUN: 22 mg/dL (ref 8–23)
BUN: 19 mg/dL (ref 8–23)
CO2: 22 mmol/L (ref 22–32)
CO2: 21 mmol/L — ABNORMAL LOW (ref 22–32)
Calcium: 8 mg/dL — ABNORMAL LOW (ref 8.9–10.3)
Calcium: 8.1 mg/dL — ABNORMAL LOW (ref 8.9–10.3)
Chloride: 102 mmol/L (ref 98–111)
Chloride: 101 mmol/L (ref 98–111)
Creatinine, Ser: 0.75 mg/dL (ref 0.44–1.00)
Creatinine, Ser: 0.69 mg/dL (ref 0.44–1.00)
GFR, Estimated: >60 mL/min (ref >60)
GFR, Estimated: >60 mL/min (ref >60)
Glucose, Bld: 100 mg/dL — ABNORMAL HIGH (ref 70–99)
Glucose, Bld: 104 mg/dL — ABNORMAL HIGH (ref 70–99)
Potassium: 3.4 mmol/L — ABNORMAL LOW (ref 3.5–5.1)
Potassium: 3.9 mmol/L (ref 3.5–5.1)
Sodium: 134 mmol/L — ABNORMAL LOW (ref 135–145)
Sodium: 134 mmol/L — ABNORMAL LOW (ref 135–145)

## 2024-03-31 LAB — CBC WITH DIFFERENTIAL/PLATELET
Abs Immature Granulocytes: 0.04 K/uL (ref 0.00–0.07)
Basophils Absolute: 0 K/uL (ref 0.0–0.1)
Basophils Relative: 0 %
Eosinophils Absolute: 0.7 K/uL — ABNORMAL HIGH (ref 0.0–0.5)
Eosinophils Relative: 8 %
HCT: 27.9 % — ABNORMAL LOW (ref 36.0–46.0)
Hemoglobin: 9.1 g/dL — ABNORMAL LOW (ref 12.0–15.0)
Immature Granulocytes: 0 %
Lymphocytes Relative: 15 %
Lymphs Abs: 1.3 K/uL (ref 0.7–4.0)
MCH: 29.1 pg (ref 26.0–34.0)
MCHC: 32.6 g/dL (ref 30.0–36.0)
MCV: 89.1 fL (ref 80.0–100.0)
Monocytes Absolute: 0.8 K/uL (ref 0.1–1.0)
Monocytes Relative: 9 %
Neutro Abs: 6.2 K/uL (ref 1.7–7.7)
Neutrophils Relative %: 68 %
Platelets: 201 K/uL (ref 150–400)
RBC: 3.13 MIL/uL — ABNORMAL LOW (ref 3.87–5.11)
RDW: 13.4 % (ref 11.5–15.5)
WBC: 9.1 K/uL (ref 4.0–10.5)
nRBC: 0 % (ref 0.0–0.2)

## 2024-03-31 LAB — CBC
HCT: 26.8 % — ABNORMAL LOW (ref 36.0–46.0)
Hemoglobin: 8.9 g/dL — ABNORMAL LOW (ref 12.0–15.0)
MCH: 29.2 pg (ref 26.0–34.0)
MCHC: 33.2 g/dL (ref 30.0–36.0)
MCV: 87.9 fL (ref 80.0–100.0)
Platelets: 205 K/uL (ref 150–400)
RBC: 3.05 MIL/uL — ABNORMAL LOW (ref 3.87–5.11)
RDW: 13.6 % (ref 11.5–15.5)
WBC: 9.4 K/uL (ref 4.0–10.5)
nRBC: 0 % (ref 0.0–0.2)

## 2024-03-31 LAB — TYPE AND SCREEN
ABO/RH(D): A NEG
Antibody Screen: NEGATIVE

## 2024-03-31 LAB — MAGNESIUM: Magnesium: 1.7 mg/dL (ref 1.7–2.4)

## 2024-03-31 MED ORDER — POTASSIUM CHLORIDE CRYS ER 20 MEQ PO TBCR
40.0000 meq | EXTENDED_RELEASE_TABLET | Freq: Once | ORAL | Status: AC
  Administered 2024-03-31: 40 meq via ORAL
  Filled 2024-03-31: qty 2

## 2024-03-31 MED ORDER — MAGNESIUM SULFATE IN D5W 1-5 GM/100ML-% IV SOLN
1.0000 g | Freq: Once | INTRAVENOUS | Status: AC
  Administered 2024-03-31: 1 g via INTRAVENOUS
  Filled 2024-03-31: qty 100

## 2024-03-31 MED ORDER — POTASSIUM CHLORIDE CRYS ER 20 MEQ PO TBCR
20.0000 meq | EXTENDED_RELEASE_TABLET | Freq: Once | ORAL | Status: AC
  Administered 2024-03-31: 20 meq via ORAL
  Filled 2024-03-31: qty 1

## 2024-03-31 MED ORDER — ATORVASTATIN CALCIUM 10 MG PO TABS
10.0000 mg | ORAL_TABLET | ORAL | Status: DC
  Administered 2024-03-31 – 2024-04-02 (×3): 10 mg via ORAL
  Filled 2024-03-31 (×3): qty 1

## 2024-03-31 MED ORDER — HYDRALAZINE HCL 50 MG PO TABS
50.0000 mg | ORAL_TABLET | Freq: Three times a day (TID) | ORAL | Status: DC
  Administered 2024-03-31 – 2024-04-02 (×7): 50 mg via ORAL
  Filled 2024-03-31 (×7): qty 1

## 2024-03-31 NOTE — Progress Notes (Signed)
 Physical Therapy Treatment Patient Details Name: Jodi Colon MRN: [id number] DOB: 22-May-1930 Today's Date: 03/31/2024   History of Present Illness 89 y.o. female presents to [facility] 03/30/24 from SNF with nausea and high BP. Pt with melena and acute on chronic anemia. DDX includes esophagitis versus camerons erosions secondary to hiatal hernia. PMH of bladder cancer diagnosed with high-grade TA TCC of the bladder in 2000 (s/p of BCG and intra bladder chemotherapy), h/o CVA, h/o subarachnoid hemorrhage/brain aneurysm (followed by Neurosurgery), brain aneurysm, CVD s/p PTCA R MCA,  HTN,  HLD, anemia, cataract, GERD, osteopenia, HLD, RLS, spinal stenosis, thyroid  disease, h/o urinary incontinence, depression with anxiety, hyponatremia, breast cancer (s/p of right lumpectomy and radiation therapy.   PT Comments  Pt [facility] to [facility] from SNF where she was ambulating with a rollator and working with PT/OT. Pt presents with generalized weakness, decreased activity tolerance, and impaired balance/gait. Pt was able to stand with ModA/MinA with assist to correct initial posterior lean. Able to perform multiple stands for pericare/underwear change with assist needed to don/doff. Able to ambulate 23ft with RW and MinA to correct intermittent posterior lean. Plan for return back to <3hrs post acute rehab to prevent future falls and work towards independence with mobility. Pt has intermittent assist available at home. Acute PT to follow.     If plan is discharge home, recommend the following: A lot of help with walking and/or transfers;A lot of help with bathing/dressing/bathroom;Assistance with cooking/housework;Direct supervision/assist for medications management;Assist for transportation;Help with stairs or ramp for entrance;Direct supervision/assist for financial management   Can travel by private vehicle     Yes  Equipment Recommendations  None recommended by PT       Precautions / Restrictions  Precautions Precautions: Fall Recall of Precautions/Restrictions: Intact Restrictions Weight Bearing Restrictions Per Provider Order: No     Mobility  Bed Mobility  General bed mobility comments: in recliner upon arrival    Transfers Overall transfer level: Needs assistance Equipment used: Rolling walker (2 wheels) Transfers: Sit to/from Stand Sit to Stand: Min assist, Mod assist    General transfer comment: MinA/ModA for initial posterior lean. Cues for anterior weight shift and hand placement    Ambulation/Gait Ambulation/Gait assistance: Min assist Gait Distance (Feet): 15 Feet Assistive device: Rolling walker (2 wheels) Gait Pattern/deviations: Step-to pattern, Shuffle Gait velocity: decreased     General Gait Details: shufffling steps with forward flexed posture. Increased time and effort. MinA for slight steadying assist with intermittent posterior lean      Balance Overall balance assessment: Needs assistance, History of Falls Sitting-balance support: No upper extremity supported, Feet supported Sitting balance-Leahy Scale: Fair   Postural control: Posterior lean Standing balance support: Bilateral upper extremity supported, Reliant on assistive device for balance Standing balance-Leahy Scale: Poor Standing balance comment: posterior lean requiring external and UE support       Communication Communication Communication: Impaired Factors Affecting Communication: Hearing impaired;Reduced clarity of speech  Cognition Arousal: Alert Behavior During Therapy: WFL for tasks assessed/performed, Flat affect   PT - Cognitive impairments: No apparent impairments    Following commands: Intact      Cueing Cueing Techniques: Verbal cues, Tactile cues, Visual cues         Pertinent Vitals/Pain Pain Assessment Pain Assessment: No/denies pain    Home Living Family/patient expects to be discharged to:: Private residence Living Arrangements: Alone Available Help  at Discharge: Family;Available PRN/intermittently Type of Home: House Home Access: Stairs to enter Entrance Stairs-Rails: Left;Right;Can reach both Entrance  Stairs-Number of Steps: 1   Home Layout: One level Home Equipment: Rollator (4 wheels);Grab bars - toilet;Grab bars - tub/shower;BSC/3in1;Hand held shower head;Rolling Walker (2 wheels)          PT Goals (current goals can now be found in the care plan section) Acute Rehab PT Goals Patient Stated Goal: to go back to rehab PT Goal Formulation: With patient/family Time For Goal Achievement: 04/14/24 Potential to Achieve Goals: Good    Frequency    Min 1X/week       AM-PAC PT 6 Clicks Mobility   Outcome Measure  Help needed turning from your back to your side while in a flat bed without using bedrails?: A Little Help needed moving from lying on your back to sitting on the side of a flat bed without using bedrails?: A Lot Help needed moving to and from a bed to a chair (including a wheelchair)?: A Lot Help needed standing up from a chair using your arms (e.g., wheelchair or bedside chair)?: A Lot Help needed to walk in hospital room?: A Little Help needed climbing 3-5 steps with a railing? : Total 6 Click Score: 13    End of Session Equipment Utilized During Treatment: Gait belt Activity Tolerance: Patient tolerated treatment well Patient left: in chair;with call bell/phone within reach;with chair alarm set;with family/visitor present Nurse Communication: Mobility status PT Visit Diagnosis: Unsteadiness on feet (R26.81);Muscle weakness (generalized) (M62.81);History of falling (Z91.81);Difficulty in walking, not elsewhere classified (R26.2)     Time: 8386-8356 PT Time Calculation (min) (ACUTE ONLY): 30 min  Charges:    $Therapeutic Activity: 8-22 mins PT General Charges $$ ACUTE PT VISIT: 1 Visit                    Jodi Colon, PT, DPT Secure Chat Preferred  Rehab Office [phone]   Jodi Colon  Jodi Colon 03/31/2024, 5:08 PM

## 2024-03-31 NOTE — Plan of Care (Signed)
  Problem: Nutrition: Goal: Adequate nutrition will be maintained Outcome: Progressing   Problem: Clinical Measurements: Goal: Ability to maintain clinical measurements within normal limits will improve Outcome: Not Progressing

## 2024-03-31 NOTE — Progress Notes (Signed)
 PROGRESS NOTE                                                                                                                                                                                                             Patient Demographics:    Jodi Colon, is a 89 y.o. female, DOB - 1929-07-01, FMW:[id number]  Outpatient Primary MD for the patient is [name], [name], NP    LOS - 1  Admit date - 03/30/2024    No chief complaint on file.      Brief Narrative (HPI from H&P)   89 y.o. female with medical history significant of bladder cancer diagnosed with high-grade TA TCC of the bladder in 2000 (s/p of BCG and intra bladder chemotherapy), h/o CVA, h/o subarachnoid hemorrhage/brain aneurysm (followed by Neurosurgery), brain aneurysm, CVD s/p PTCA R MCA, HTN, HLD, anemia, cataract, GERD, osteopenia, HLD, RLS, spinal stenosis, thyroid  disease, h/o urinary incontinence, depression with anxiety, hyponatremia, breast cancer (s/p of right lumpectomy and radiation therapy), and recent admission at [facility] from 10/20-23 for recurrent falls and found to have rhabdomyolysis that self resolved c/b hyponatremia (attributed to SSRI use) and old infarcts on MRI who p/w some nausea, and not feeling well, she had melanotic stool which was guaiac positive and was sent to the hospital for further workup.   Subjective:    Jodi Colon today has, No headache, No chest pain, No abdominal pain - No Nausea, No new weakness tingling or numbness, no shortness of breath, no blood in stool or dark stool.   Assessment  & Plan :   Melanotic stool.  Could have had upper GI bleed, CBC has dropped somewhat but stable at 9, also some element of him dilution from IV fluids, on PPI seen by GI family prefers conservative management, on clear liquid diets with advance to soft, continue PPI and monitor CBC.  Type screen done.  Patient agreeable for transfusion if needed,  for now seems to have no ongoing bleeding but will monitor for another 24 hours.  Discussed with family.   History of bladder cancer.  Stable outpatient follow-up.  History of brain aneurysm.  No acute issues outpatient follow-up with neurosurgery postdischarge.  History of falls and rhabdomyolysis.  Currently living in assisted living facility likely discharge in 1 to 2 days, PT eval here.  Condition - Extremely Guarded  Family Communication  : Called daughter Jodi Colon [phone]  and updated on 03/31/2024, called daughter Jodi Colon earlier no response on [phone] at 03/31/2024 at 9:30 AM  Code Status : DNR  Consults  : [facility]  PUD Prophylaxis : PPI   Procedures  :     CT head - non acute      Disposition Plan  :    Status is: Inpatient   DVT Prophylaxis  :    SCDs Start: 03/30/24 1448    Lab Results  Component Value Date   PLT 201 03/31/2024    Diet :  Diet Order             DIET SOFT Fluid consistency: Thin  Diet effective now                    Inpatient Medications  Scheduled Meds:  levothyroxine   75 mcg Oral Q0600   magic mouthwash  5 mL Oral TID   pantoprazole  (PROTONIX ) IV  40 mg Intravenous Q12H   potassium chloride   20 mEq Oral Once   rOPINIRole   0.25 mg Oral BID   Continuous Infusions: PRN Meds:.  Antibiotics  :    Anti-infectives (From admission, onward)    None         Objective:   Vitals:   03/31/24 0117 03/31/24 0511 03/31/24 0514 03/31/24 0821  BP:  (!) 147/52    Pulse:  (!) 106    Resp:  19    Temp:  98.5 F (36.9 C)  97.7 F (36.5 C)  TempSrc:  Oral  Oral  SpO2: 95% 92% 94%   Weight:      Height:        Wt Readings from Last 3 Encounters:  03/30/24 52 kg  03/19/24 52.6 kg  02/16/24 52.2 kg     Intake/Output Summary (Last 24 hours) at 03/31/2024 0931 Last data filed at 03/30/2024 1900 Gross per 24 hour  Intake 361.67 ml  Output --  Net 361.67 ml     Physical Exam  Awake Alert, No new F.N  deficits, Normal affect Boley.AT,PERRAL Supple Neck, No JVD,   Symmetrical Chest wall movement, Good air movement bilaterally, CTAB RRR,No Gallops,Rubs or new Murmurs,  +ve B.Sounds, Abd Soft, No tenderness,   No Cyanosis, Clubbing or edema        Data Review:    Recent Labs  Lab 03/30/24 1123 03/30/24 1146 03/31/24 0403 03/31/24 0712  WBC 10.4  --  9.4 9.1  HGB 11.0* 11.2* 8.9* 9.1*  HCT 33.1* 33.0* 26.8* 27.9*  PLT 288  --  205 201  MCV 87.8  --  87.9 89.1  MCH 29.2  --  29.2 29.1  MCHC 33.2  --  33.2 32.6  RDW 13.5  --  13.6 13.4  LYMPHSABS 1.0  --   --  1.3  MONOABS 0.7  --   --  0.8  EOSABS 0.1  --   --  0.7*  BASOSABS 0.0  --   --  0.0    Recent Labs  Lab 03/30/24 1146 03/30/24 1323 03/31/24 0403 03/31/24 0712  NA 132* 135 134* 134*  K 4.8 3.3* 3.4* 3.9  CL 99 97* 102 101  CO2  --  24 22 21*  ANIONGAP  --  14 10 12   GLUCOSE 139* 127* 100* 104*  BUN 49* 32* 22 19  CREATININE 0.70 0.70 0.75 0.69  AST  --  23  --   --  ALT  --  26  --   --   ALKPHOS  --  98  --   --   BILITOT  --  0.5  --   --   ALBUMIN  --  3.0*  --   --   MG  --   --   --  1.7  CALCIUM   --  9.2 8.0* 8.1*      Recent Labs  Lab 03/30/24 1323 03/31/24 0403 03/31/24 0712  MG  --   --  1.7  CALCIUM  9.2 8.0* 8.1*    --------------------------------------------------------------------------------------------------------------- Lab Results  Component Value Date   CHOL 180 02/10/2015   HDL 81 02/10/2015   LDLCALC 86 02/10/2015   TRIG 63 02/10/2015   CHOLHDL 2.2 02/10/2015    Lab Results  Component Value Date   HGBA1C 6.0 (H) 02/10/2015   No results for input(s): TSH, T4TOTAL, FREET4, T3FREE, THYROIDAB in the last 72 hours. No results for input(s): VITAMINB12, FOLATE, FERRITIN, TIBC, IRON , RETICCTPCT in the last 72 hours. ------------------------------------------------------------------------------------------------------------------ Cardiac Enzymes No  results for input(s): CKMB, TROPONINI, MYOGLOBIN in the last 168 hours.  Invalid input(s): CK  Micro Results Recent Results (from the past 240 hours)  Urine Culture (for pregnant, neutropenic or urologic patients or patients with an indwelling urinary catheter)     Status: Abnormal   Collection Time: 03/22/24  1:38 PM   Specimen: Urine, Clean Catch  Result Value Ref Range Status   Specimen Description   Final    URINE, CLEAN CATCH Performed at [facility], [address], [place], [place] [address]    Special Requests   Final    NONE Performed at [facility], [address] [place], [place] [address]    Culture MULTIPLE SPECIES PRESENT, SUGGEST RECOLLECTION (A)  Final   Report Status 03/23/2024 FINAL  Final    Radiology Report CT Head Wo Contrast Result Date: 03/30/2024 EXAM: CT HEAD WITHOUT CONTRAST 03/30/2024 12:13:00 PM TECHNIQUE: CT of the head was performed without the administration of intravenous contrast. Automated exposure control, iterative reconstruction, and/or weight based adjustment of the mA/kV was utilized to reduce the radiation dose to as low as reasonably achievable. COMPARISON: CT of the head dated 03/19/2024. CLINICAL HISTORY: Mental status change, unknown cause. FINDINGS: BRAIN AND VENTRICLES: No acute hemorrhage. No evidence of acute infarct. Moderate generalized volume loss. Moderate periventricular and deep cerebral white matter disease. Chronic lacunar infarcts present within the caudate nuclei bilaterally. Chronic lacunar infarct is also noted within the right thalamus. A stent is present within the right middle cerebral artery. No hydrocephalus. No extra-axial collection. No mass effect or midline shift. The study is mildly degraded by patient motion but is significantly improved relative to the previous study. ORBITS: The patient is status post bilateral lens replacement. No acute abnormality. SINUSES: No acute abnormality. SOFT  TISSUES AND SKULL: No acute soft tissue abnormality. No skull fracture. IMPRESSION: 1. No acute intracranial abnormality. 2. Moderate generalized volume loss and moderate periventricular and deep cerebral white matter disease. 3. Chronic lacunar infarcts in the caudate nuclei bilaterally and right thalamus. 4. Right middle cerebral artery stent. Electronically signed by: Evalene Coho MD 03/30/2024 12:27 PM EDT RP Workstation: [id number]   DG Chest Port 1 View Result Date: 03/30/2024 EXAM: 1 VIEW(S) XRAY OF THE CHEST 03/30/2024 11:50:00 AM COMPARISON: None available. CLINICAL HISTORY: chest pain FINDINGS: LUNGS AND PLEURA: Mild chronic coarsened interstitial markings without pulmonary edema. No focal pulmonary opacity. No pleural effusion. No pneumothorax. HEART  AND MEDIASTINUM: Aortic calcification. BONES AND SOFT TISSUES: No acute osseous abnormality. IMPRESSION: 1. No acute cardiopulmonary process. 2. Chronic coarsened interstitial markings without pulmonary edema. Electronically signed by: Norleen Boxer MD 03/30/2024 12:20 PM EDT RP Workstation: [id number]     Signature  -   Lavada Stank M.D on 03/31/2024 at 9:31 AM   -  To page go to [email or web address]

## 2024-03-31 NOTE — Progress Notes (Addendum)
 Progress Note   LOS: 1 day   Chief Complaint: melena, anemia   Subjective   Patient states she had a dark bm this morning. Nursing staff states she has not had a bm. Last documented bm was 2350 and was documented as brown. Patient denies pain, nausea, vomiting.   Objective   Vital signs in last 24 hours: Temp:  [97.7 F (36.5 C)-99 F (37.2 C)] 98.4 F (36.9 C) (11/01 1207) Pulse Rate:  [77-111] 77 (11/01 1207) Resp:  [19-24] 24 (11/01 1207) BP: (101-147)/(52-77) 147/52 (11/01 0511) SpO2:  [89 %-98 %] 98 % (11/01 1207)   Last BM recorded by nurses in past 5 days Stool Type: Type 7 (Liquid consistency with no solid pieces) (03/30/2024 11:50 PM)  General:   female in no acute distress, elderly, frail Heart:  Regular rate and rhythm; no murmurs Pulm: Clear anteriorly; no wheezing Abdomen: soft, nondistended, normal bowel sounds in all quadrants. Nontender without guarding. No organomegaly appreciated. Extremities:  No edema Neurologic:  Alert and  oriented x4;  No focal deficits.  Psych:  Cooperative. Normal mood and affect.  Intake/Output from previous day: 10/31 0701 - 11/01 0700 In: 361.7 [I.V.:361.7] Out: -  Intake/Output this shift: No intake/output data recorded.  Studies/Results: CT Head Wo Contrast Result Date: 03/30/2024 EXAM: CT HEAD WITHOUT CONTRAST 03/30/2024 12:13:00 PM TECHNIQUE: CT of the head was performed without the administration of intravenous contrast. Automated exposure control, iterative reconstruction, and/or weight based adjustment of the mA/kV was utilized to reduce the radiation dose to as low as reasonably achievable. COMPARISON: CT of the head dated 03/19/2024. CLINICAL HISTORY: Mental status change, unknown cause. FINDINGS: BRAIN AND VENTRICLES: No acute hemorrhage. No evidence of acute infarct. Moderate generalized volume loss. Moderate periventricular and deep cerebral white matter disease. Chronic lacunar infarcts present within the caudate  nuclei bilaterally. Chronic lacunar infarct is also noted within the right thalamus. A stent is present within the right middle cerebral artery. No hydrocephalus. No extra-axial collection. No mass effect or midline shift. The study is mildly degraded by patient motion but is significantly improved relative to the previous study. ORBITS: The patient is status post bilateral lens replacement. No acute abnormality. SINUSES: No acute abnormality. SOFT TISSUES AND SKULL: No acute soft tissue abnormality. No skull fracture. IMPRESSION: 1. No acute intracranial abnormality. 2. Moderate generalized volume loss and moderate periventricular and deep cerebral white matter disease. 3. Chronic lacunar infarcts in the caudate nuclei bilaterally and right thalamus. 4. Right middle cerebral artery stent. Electronically signed by: Evalene Coho MD 03/30/2024 12:27 PM EDT RP Workstation: [id number]   DG Chest Port 1 View Result Date: 03/30/2024 EXAM: 1 VIEW(S) XRAY OF THE CHEST 03/30/2024 11:50:00 AM COMPARISON: None available. CLINICAL HISTORY: chest pain FINDINGS: LUNGS AND PLEURA: Mild chronic coarsened interstitial markings without pulmonary edema. No focal pulmonary opacity. No pleural effusion. No pneumothorax. HEART AND MEDIASTINUM: Aortic calcification. BONES AND SOFT TISSUES: No acute osseous abnormality. IMPRESSION: 1. No acute cardiopulmonary process. 2. Chronic coarsened interstitial markings without pulmonary edema. Electronically signed by: Norleen Boxer MD 03/30/2024 12:20 PM EDT RP Workstation: [id number]    Lab Results: Recent Labs    03/30/24 1123 03/30/24 1146 03/31/24 0403 03/31/24 0712  WBC 10.4  --  9.4 9.1  HGB 11.0* 11.2* 8.9* 9.1*  HCT 33.1* 33.0* 26.8* 27.9*  PLT 288  --  205 201   BMET Recent Labs    03/30/24 1323 03/31/24 0403 03/31/24 0712  NA 135 134* 134*  K  3.3* 3.4* 3.9  CL 97* 102 101  CO2 24 22 21*  GLUCOSE 127* 100* 104*  BUN 32* 22 19  CREATININE 0.70 0.75 0.69   CALCIUM  9.2 8.0* 8.1*   LFT Recent Labs    03/30/24 1323  PROT 5.9*  ALBUMIN 3.0*  AST 23  ALT 26  ALKPHOS 98  BILITOT 0.5   PT/INR No results for input(s): LABPROT, INR in the last 72 hours.   Scheduled Meds:  atorvastatin   10 mg Oral BH-q7a   hydrALAZINE   50 mg Oral TID   levothyroxine   75 mcg Oral Q0600   magic mouthwash  5 mL Oral TID   pantoprazole  (PROTONIX ) IV  40 mg Intravenous Q12H   rOPINIRole   0.25 mg Oral BID   Continuous Infusions:    Patient profile:   89 y.o. female with past medical history significant for female with medical history significant of bladder cancer diagnosed with high-grade TA TCC of the bladder in 2000 (s/p of BCG and intra bladder chemotherapy), h/o CVA, h/o subarachnoid hemorrhage/brain aneurysm (followed by Neurosurgery), brain aneurysm, CVD s/p PTCA R MCA, HTN, HLD, anemia, cataract, GERD, osteopenia, HLD, RLS, spinal stenosis, thyroid  disease, h/o urinary incontinence, depression with anxiety, hyponatremia, breast cancer (s/p of right lumpectomy and radiation therapy), and recent admission at [facility] from 10/20-23 for recurrent falls and found to have rhabdomyolysis that self resolved c/b hyponatremia (attributed to SSRI use) and old infarcts on MRI who currently resides in SNF presents for evaluation of melena.    Impression:   Melena Acute on chronic anemia Hx of large hiatal hernia with paraesophageal component CTAP without contrast 07/2022 with large hiatal hernia, diverticulosis Sigmoidoscopy 2012 with hemorrhoids, otherwise unrevealing Barium swallow 2021 with GERD and moderate hiatal hernia No NSAIDS Hgb 9.1 (down from 11.2) BUN 19, Cr. 0.69 DDX includes  esophagitis versus camerons erosions secondary to hiatal hernia.  Due to age and comorbidities, currently planning to hold of on endoscopic procedures unless benefits outweigh the risks. Patient and daughters agree. Drop in hgb since admission, though patient has experienced  melena and received IVF. Last bm 10/31 pm was brown   Recurrent falls Recent admit 03/19/24 with negative head CT and found to have rhabdomyolysis that self resolved. Negative head CT.   Breast cancer S/p right lumpectomy and radiation therapy   History of CVA History of SAH Followed by neurosurgery. S/p right middle cerebral artery stent   high-grade TA TCC of the bladder in 2000 (s/p of BCG and intra bladder chemotherapy)     Plan:   -- Continue daily CBC and transfuse as needed to maintain HGB > 7 - IV PPI twice daily - Continue clear liquids   Bayley M McMichael  03/31/2024, 1:55 PM   ------------------------------------------------------------------------------------------------------  I have taken a history, reviewed the chart and examined the patient. I performed a substantive portion of this encounter, including complete performance of at least one of the key components, in conjunction with the APP. I agree with the APP's note, impression and recommendations  Patient's hemoglobin dropped 2 points yesterday, but is stable this morning.  BUN has improved rapidly from 49-19 today indicating likely cessation of active bleeding.  She reportedly had a dark brown bowel movement last night.  No bowel movement today per patient/family.  No additional nausea, vomiting or dyspepsia.  She does not have much of an appetite.  Okay to advance diet as tolerated from GI standpoint.  Hopefully, bleeding has resolved with medical therapy.  No plans  for upper endoscopy, unless patient has recurrent significant bleeding.  Will follow-up with patient tomorrow  Shizuko Wojdyla E. Stacia, MD [facility]

## 2024-04-01 DIAGNOSIS — D62 Acute posthemorrhagic anemia: Secondary | ICD-10-CM | POA: Diagnosis not present

## 2024-04-01 DIAGNOSIS — K921 Melena: Secondary | ICD-10-CM | POA: Diagnosis not present

## 2024-04-01 DIAGNOSIS — K922 Gastrointestinal hemorrhage, unspecified: Secondary | ICD-10-CM

## 2024-04-01 DIAGNOSIS — K449 Diaphragmatic hernia without obstruction or gangrene: Secondary | ICD-10-CM | POA: Diagnosis not present

## 2024-04-01 LAB — CBC WITH DIFFERENTIAL/PLATELET
Abs Immature Granulocytes: 0.02 K/uL (ref 0.00–0.07)
Basophils Absolute: 0 K/uL (ref 0.0–0.1)
Basophils Relative: 1 %
Eosinophils Absolute: 0.7 K/uL — ABNORMAL HIGH (ref 0.0–0.5)
Eosinophils Relative: 11 %
HCT: 27.1 % — ABNORMAL LOW (ref 36.0–46.0)
Hemoglobin: 8.8 g/dL — ABNORMAL LOW (ref 12.0–15.0)
Immature Granulocytes: 0 %
Lymphocytes Relative: 18 %
Lymphs Abs: 1.2 K/uL (ref 0.7–4.0)
MCH: 28.9 pg (ref 26.0–34.0)
MCHC: 32.5 g/dL (ref 30.0–36.0)
MCV: 88.9 fL (ref 80.0–100.0)
Monocytes Absolute: 0.7 K/uL (ref 0.1–1.0)
Monocytes Relative: 11 %
Neutro Abs: 4 K/uL (ref 1.7–7.7)
Neutrophils Relative %: 59 %
Platelets: 203 K/uL (ref 150–400)
RBC: 3.05 MIL/uL — ABNORMAL LOW (ref 3.87–5.11)
RDW: 13.7 % (ref 11.5–15.5)
WBC: 6.6 K/uL (ref 4.0–10.5)
nRBC: 0 % (ref 0.0–0.2)

## 2024-04-01 LAB — BASIC METABOLIC PANEL WITH GFR
Anion gap: 11 (ref 5–15)
BUN: 14 mg/dL (ref 8–23)
CO2: 22 mmol/L (ref 22–32)
Calcium: 8 mg/dL — ABNORMAL LOW (ref 8.9–10.3)
Chloride: 100 mmol/L (ref 98–111)
Creatinine, Ser: 0.76 mg/dL (ref 0.44–1.00)
GFR, Estimated: >60 mL/min (ref >60)
Glucose, Bld: 100 mg/dL — ABNORMAL HIGH (ref 70–99)
Potassium: 4.1 mmol/L (ref 3.5–5.1)
Sodium: 133 mmol/L — ABNORMAL LOW (ref 135–145)

## 2024-04-01 LAB — MAGNESIUM: Magnesium: 1.9 mg/dL (ref 1.7–2.4)

## 2024-04-01 MED ORDER — PANTOPRAZOLE SODIUM 40 MG PO TBEC: 40.0000 mg | DELAYED_RELEASE_TABLET | Freq: Two times a day (BID) | ORAL | 2 refills | Status: AC

## 2024-04-01 NOTE — NC FL2 (Signed)
 [place]  MEDICAID FL2 LEVEL OF CARE FORM     IDENTIFICATION  Patient Name: Jodi Colon Birthdate: 05-17-1930 Sex: female Admission Date (Current Location): 03/30/2024  [facility] and [place] Number:  Producer, Television/film/video and Address:  [facility] [place], [address], [place], [place] [address]      Provider Number: [id number]  Attending Physician Name and Address:  Dennise Lavada POUR, MD  Relative Name and Phone Number:  Grayce Reasons (daughter) [phone]    Current Level of Care: Hospital Recommended Level of Care: Skilled Nursing Facility Prior Approval Number:    Date Approved/Denied:   PASRR Number: [id number]  Discharge Plan: SNF    Current Diagnoses: Patient Active Problem List   Diagnosis Date Noted   History of stroke 03/31/2024   Acute blood loss anemia 03/31/2024   Hiatal hernia 03/31/2024   GIB (gastrointestinal bleeding) 03/30/2024   Delirium 03/22/2024   Rhabdomyolysis 03/19/2024   Fall at home, initial encounter 03/19/2024   Depression with anxiety 03/19/2024   RLS (restless legs syndrome) 03/19/2024   Overweight (BMI 25.0-29.9) 03/19/2024   Complicated UTI (urinary tract infection) 07/30/2022   Diarrhea 07/30/2022   Bacteremia due to Escherichia coli 07/29/2022   Hyponatremia 07/27/2022   Intractable diarrhea 07/27/2022   Acute renal failure 07/27/2022   Syncope 01/29/2020   Intracranial vascular stenosis 01/29/2020   Falls frequently 12/17/2019   Seizure-like activity (HCC) 12/17/2019   Weakness 12/17/2019   Macular puckering, right eye 05/18/2019   Mechanical breakdown of intraocular lens 05/18/2019   Primary open angle glaucoma (POAG) of both eyes, moderate stage 05/18/2019   Pseudophakia of both eyes 05/18/2019   Status post total hip replacement, left 11/08/2017   SOBOE (shortness of breath on exertion) 10/04/2017   Umbilical hernia without obstruction and without gangrene 07/28/2016   Restless leg 10/21/2015    Urinary frequency 10/15/2015   Dysuria 10/15/2015   Bursitis, trochanteric 04/02/2015   Trochanteric bursitis of right hip 04/02/2015   Hypothyroidism 03/04/2015   GERD without esophagitis 03/04/2015   Subarachnoid hemorrhage (HCC) 02/09/2015   Degenerative arthritis of lumbar spine 01/29/2015   DDD (degenerative disc disease), lumbar 01/29/2015   Arthritis, degenerative 01/28/2014   Infection with methicillin-resistant Staphylococcus aureus 01/28/2014   Essential hypertension 01/28/2014   Dyslipidemia 01/28/2014   Carcinoma in situ, breast, ductal 01/28/2014   Depression 01/28/2014   Aneurysm, cerebral 01/28/2014   Intraductal carcinoma of breast 01/28/2014   Lumbar canal stenosis 12/07/2013   Neuritis or radiculitis due to rupture of lumbar intervertebral disc 12/07/2013   Cramp in lower leg 06/19/2013   Hemorrhoids that prolapse with straining, but retract spontaneously 01/24/2013   Scalp lesion 11/27/2012   Rectal prolapse 11/27/2012   Hemorrhoid 08/29/2012   Screening breast examination 07/12/2012   FOM (frequency of micturition) 02/28/2012   Urge incontinence 02/28/2012   History of neoplasm of bladder 02/28/2012   Excessive urination at night 02/28/2012   Bladder CA in situ 02/28/2012   History of methicillin resistant Staphylococcus aureus infection 12/30/2011   H/O infectious disease 12/30/2011   Cutaneous ulcer (HCC) 11/26/2011   Skin ulcer (HCC) 11/26/2011   Colon polyp 11/11/2011   Absolute anemia 11/11/2011   Back pain, chronic 11/01/2011   Abscess, gluteal, right 10/10/2011   Abscess of buttock 10/10/2011   Degenerative joint disease    Cerebrovascular disease    MRSA (methicillin resistant Staphylococcus aureus)    Hyperlipidemia with target low density lipoprotein (LDL) cholesterol less than 100 mg/dL 88/97/7987  Bladder cancer (HCC) 10/29/2009   Malignant neoplasm of urinary bladder (HCC) 10/29/2009    Orientation RESPIRATION BLADDER Height & Weight      Self, Time, Situation, Place  Normal Incontinent Weight: 114 lb 10.2 oz (52 kg) Height:  4' 9 (144.8 cm)  BEHAVIORAL SYMPTOMS/MOOD NEUROLOGICAL BOWEL NUTRITION STATUS      Incontinent Diet (Please see discharge summary)  AMBULATORY STATUS COMMUNICATION OF NEEDS Skin   Limited Assist Verbally Other (Comment) (Ecchymosis,Hip,R,Erythema,Bil.,Wound/Incision LDAs)                       Personal Care Assistance Level of Assistance  Bathing, Feeding, Dressing Bathing Assistance: Maximum assistance Feeding assistance: Limited assistance Dressing Assistance: Maximum assistance     Functional Limitations Info  Hearing, Speech, Sight Sight Info:  Financial Trader) Hearing Info: Adequate Speech Info: Adequate    SPECIAL CARE FACTORS FREQUENCY  PT (By licensed PT), OT (By licensed OT)     PT Frequency: 5x min weekly OT Frequency: 5x min weekly            Contractures Contractures Info: Not present    Additional Factors Info  Code Status, Allergies Code Status Info: DNR Allergies Info: Penicillins,Sulfa Antibiotics,Ciprofloxacin ,Sulfamethoxazole,Sulfasalazine           Current Medications (04/01/2024):  This is the current hospital active medication list Current Facility-Administered Medications  Medication Dose Route Frequency Provider Last Rate Last Admin   atorvastatin  (LIPITOR ) tablet 10 mg  10 mg Oral BH-q7a Singh, Prashant K, MD   10 mg at 04/01/24 9167   hydrALAZINE  (APRESOLINE ) tablet 50 mg  50 mg Oral TID Singh, Prashant K, MD   50 mg at 04/01/24 9167   levothyroxine  (SYNTHROID ) tablet 75 mcg  75 mcg Oral Q0600 Moore, Willie, MD   75 mcg at 04/01/24 0549   magic mouthwash  5 mL Oral TID Georgina Basket, MD   5 mL at 04/01/24 9167   pantoprazole  (PROTONIX ) injection 40 mg  40 mg Intravenous Q12H Moore, Willie, MD   40 mg at 04/01/24 9167   rOPINIRole  (REQUIP ) tablet 0.25 mg  0.25 mg Oral BID Shona Terry SAILOR, DO   0.25 mg at 04/01/24 9167   Facility-Administered  Medications Ordered in Other Encounters  Medication Dose Route Frequency Provider Last Rate Last Admin   gemcitabine  (GEMZAR ) 2,000 mg  2,000 mg Intravenous Once Finnegan, Timothy J, MD       opium -belladonna (B&O SUPPRETTES) 16.2-30 MG suppository 30 mg  30 mg Rectal Once Brandon, Ashley, MD       opium -belladonna (B&O SUPPRETTES) 16.2-30 MG suppository 30 mg  30 mg Rectal Once Finnegan, Timothy J, MD       opium -belladonna (B&O SUPPRETTES) 16.2-30 MG suppository 30 mg  30 mg Rectal Once Finnegan, Timothy J, MD       White Petrolatum  OINT 1 application  1 application  Apply externally Once Finnegan, Timothy J, MD         Discharge Medications: Please see discharge summary for a list of discharge medications.  Relevant Imaging Results:  Relevant Lab Results:   Additional Information SSN-[id number]  Isaiah Public, LCSWA

## 2024-04-01 NOTE — Plan of Care (Signed)
   Problem: Clinical Measurements: Goal: Diagnostic test results will improve Outcome: Progressing

## 2024-04-01 NOTE — Progress Notes (Addendum)
 Progress Note   LOS: 2 days   Chief Complaint: Melena/anemia   Subjective   Last 2 documented bowel movements in the last 12 hours were brown.  Patient states she has not had a bowel movement this morning and is feeling much better than previous days and would like to be discharged back to rehab    Objective   Vital signs in last 24 hours: Temp:  [97.7 F (36.5 C)-98.4 F (36.9 C)] 98.1 F (36.7 C) (11/02 0750) Pulse Rate:  [77-98] 86 (11/02 0750) Resp:  [18-24] 18 (11/02 0750) BP: (130-155)/(46-87) 133/46 (11/02 0750) SpO2:  [93 %-98 %] 93 % (11/02 0750)   Last BM recorded by nurses in past 5 days Stool Type: Type 7 (Liquid consistency with no solid pieces) (03/30/2024 11:50 PM)  General:   female in no acute distress  Heart:  Regular rate and rhythm; no murmurs Pulm: Clear anteriorly; no wheezing Abdomen: soft, nondistended, normal bowel sounds in all quadrants. Nontender without guarding. No organomegaly appreciated. Extremities:  No edema Neurologic:  Alert and  oriented x4;  No focal deficits.  Psych:  Cooperative. Normal mood and affect.  Intake/Output from previous day: 11/01 0701 - 11/02 0700 In: 120 [P.O.:120] Out: 1100 [Urine:1100] Intake/Output this shift: No intake/output data recorded.  Studies/Results: CT Head Wo Contrast Result Date: 03/30/2024 EXAM: CT HEAD WITHOUT CONTRAST 03/30/2024 12:13:00 PM TECHNIQUE: CT of the head was performed without the administration of intravenous contrast. Automated exposure control, iterative reconstruction, and/or weight based adjustment of the mA/kV was utilized to reduce the radiation dose to as low as reasonably achievable. COMPARISON: CT of the head dated 03/19/2024. CLINICAL HISTORY: Mental status change, unknown cause. FINDINGS: BRAIN AND VENTRICLES: No acute hemorrhage. No evidence of acute infarct. Moderate generalized volume loss. Moderate periventricular and deep cerebral white matter disease. Chronic lacunar  infarcts present within the caudate nuclei bilaterally. Chronic lacunar infarct is also noted within the right thalamus. A stent is present within the right middle cerebral artery. No hydrocephalus. No extra-axial collection. No mass effect or midline shift. The study is mildly degraded by patient motion but is significantly improved relative to the previous study. ORBITS: The patient is status post bilateral lens replacement. No acute abnormality. SINUSES: No acute abnormality. SOFT TISSUES AND SKULL: No acute soft tissue abnormality. No skull fracture. IMPRESSION: 1. No acute intracranial abnormality. 2. Moderate generalized volume loss and moderate periventricular and deep cerebral white matter disease. 3. Chronic lacunar infarcts in the caudate nuclei bilaterally and right thalamus. 4. Right middle cerebral artery stent. Electronically signed by: Evalene Coho MD 03/30/2024 12:27 PM EDT RP Workstation: [id number]   DG Chest Port 1 View Result Date: 03/30/2024 EXAM: 1 VIEW(S) XRAY OF THE CHEST 03/30/2024 11:50:00 AM COMPARISON: None available. CLINICAL HISTORY: chest pain FINDINGS: LUNGS AND PLEURA: Mild chronic coarsened interstitial markings without pulmonary edema. No focal pulmonary opacity. No pleural effusion. No pneumothorax. HEART AND MEDIASTINUM: Aortic calcification. BONES AND SOFT TISSUES: No acute osseous abnormality. IMPRESSION: 1. No acute cardiopulmonary process. 2. Chronic coarsened interstitial markings without pulmonary edema. Electronically signed by: Norleen Boxer MD 03/30/2024 12:20 PM EDT RP Workstation: [id number]    Lab Results: Recent Labs    03/31/24 0403 03/31/24 0712 04/01/24 0437  WBC 9.4 9.1 6.6  HGB 8.9* 9.1* 8.8*  HCT 26.8* 27.9* 27.1*  PLT 205 201 203   BMET Recent Labs    03/31/24 0403 03/31/24 0712 04/01/24 0437  NA 134* 134* 133*  K 3.4* 3.9 4.1  CL 102 101 100  CO2 22 21* 22  GLUCOSE 100* 104* 100*  BUN 22 19 14   CREATININE 0.75 0.69 0.76   CALCIUM  8.0* 8.1* 8.0*   LFT Recent Labs    03/30/24 1323  PROT 5.9*  ALBUMIN 3.0*  AST 23  ALT 26  ALKPHOS 98  BILITOT 0.5   PT/INR No results for input(s): LABPROT, INR in the last 72 hours.   Scheduled Meds:  atorvastatin   10 mg Oral BH-q7a   hydrALAZINE   50 mg Oral TID   levothyroxine   75 mcg Oral Q0600   magic mouthwash  5 mL Oral TID   pantoprazole  (PROTONIX ) IV  40 mg Intravenous Q12H   rOPINIRole   0.25 mg Oral BID   Continuous Infusions:    Patient profile:   89 y.o. female with past medical history significant for female with medical history significant of bladder cancer diagnosed with high-grade TA TCC of the bladder in 2000 (s/p of BCG and intra bladder chemotherapy), h/o CVA, h/o subarachnoid hemorrhage/brain aneurysm (followed by Neurosurgery), brain aneurysm, CVD s/p PTCA R MCA, HTN, HLD, anemia, cataract, GERD, osteopenia, HLD, RLS, spinal stenosis, thyroid  disease, h/o urinary incontinence, depression with anxiety, hyponatremia, breast cancer (s/p of right lumpectomy and radiation therapy), and recent admission at [facility] from 10/20-23 for recurrent falls and found to have rhabdomyolysis that self resolved c/b hyponatremia (attributed to SSRI use) and old infarcts on MRI who currently resides in SNF presents for evaluation of melena.      Impression:   Melena Acute on chronic anemia Hx of large hiatal hernia with paraesophageal component CTAP without contrast 07/2022 with large hiatal hernia, diverticulosis Sigmoidoscopy 2012 with hemorrhoids, otherwise unrevealing Barium swallow 2021 with GERD and moderate hiatal hernia No NSAIDS Hgb 8.8, stable (9.1) BUN normal DDX includes  esophagitis versus camerons erosions secondary to hiatal hernia.  Due to age and comorbidities, currently planning to hold of on endoscopic procedures unless benefits outweigh the risks. Patient and daughters agree. Drop in hgb since admission, though patient has experienced melena  and received IVF. Last bm 11/1 pm was brown   Recurrent falls Recent admit 03/19/24 with negative head CT and found to have rhabdomyolysis that self resolved. Negative head CT.   Breast cancer S/p right lumpectomy and radiation therapy   History of CVA History of SAH Followed by neurosurgery. S/p right middle cerebral artery stent   high-grade TA TCC of the bladder in 2000 (s/p of BCG and intra bladder chemotherapy)    Plan:   - Would continue PPI twice daily - Stable hemoglobin and no further bleeding, could consider discharge back to rehab  Bayley CHRISTELLA Blower  04/01/2024, 10:09 AM   -------------------------------------------------------------------------------------------------- I have taken a history, reviewed the chart and examined the patient. I performed a substantive portion of this encounter, including complete performance of at least one of the key components, in conjunction with the APP. I agree with the APP's note, impression and recommendations.  Patient feeling well.  No further GI bleeding.  Hemoglobin stable.  Okay for discharge home from GI standpoint. Would recommend 8 weeks of twice daily PPI, then once daily PPI indefinitely. No GI follow-up necessary.  Carolos Fecher E. Stacia, MD [facility]

## 2024-04-01 NOTE — Discharge Instructions (Signed)
 Follow with Primary MD [name], [name], NP in 7 days   Get CBC, CMP, Magnesium , 2 view Chest X ray -  checked next visit with your primary MD or SNF MD   Activity: As tolerated with Full fall precautions use walker/cane & assistance as needed  Disposition SNF  Diet: Heart Healthy    Special Instructions: If you have smoked or chewed Tobacco  in the last 2 yrs please stop smoking, stop any regular Alcohol  and or any Recreational drug use.  On your next visit with your primary care physician please Get Medicines reviewed and adjusted.  Please request your Prim.MD to go over all Hospital Tests and Procedure/Radiological results at the follow up, please get all Hospital records sent to your Prim MD by signing hospital release before you go home.  If you experience worsening of your admission symptoms, develop shortness of breath, life threatening emergency, suicidal or homicidal thoughts you must seek medical attention immediately by calling 911 or calling your MD immediately  if symptoms less severe.  You Must read complete instructions/literature along with all the possible adverse reactions/side effects for all the Medicines you take and that have been prescribed to you. Take any new Medicines after you have completely understood and accpet all the possible adverse reactions/side effects.   Do not drive when taking Pain medications.  Do not take more than prescribed Pain, Sleep and Anxiety Medications  Wear Seat belts while driving.

## 2024-04-01 NOTE — TOC Initial Note (Addendum)
 Transition of Care [facility]) - Initial/Assessment Note    Patient Details  Name: Jodi Colon MRN: [id number] Date of Birth: [birth year]  [facility] CM/SW Contact:    Isaiah Public, LCSWA Phone Number: 04/01/2024, 9:52 AM  Clinical Narrative:                  CSW received consult for possible SNF placement at time of discharge. CSW spoke with patient regarding PT recommendation of SNF placement at time of discharge.  Patient expressed understanding of PT recommendation and is agreeable to SNF placement at time of discharge.Patient reports PTA she comes from [facility] short term. Patient reports she would like to return to [facility] for short term rehab then her plan is to return back home. Patient reports she lives at home alone.Patient gave CSW permission to fax out for SNF to other facility's just in case [name] unable to offer SNF bed. Patient gave CSW permission to follow up with her daughter Grayce on her dc plan. Patient gave CSW permission to reach out to financial counseling to screen her for medicaid. All questions answered. No further questions reported at this time. CSW spoke with Darrian with [name] who informed CSW that patient can return if she pays balance that is due. CSW provided patient and patients daughter Grayce with this information.Facility said that Sonya patients daughter can call facility tomorrow to discuss balance/or for any financial questions. CSW informed patients daughter Grayce. Sonya plans on following up with facility tomorrow. CSW discussed insurance authorization process. All questions answered. No further questions reported at this time. CSW reached out to [place] in financial counseling and request to screen patient for medicaid.CSW to continue to follow and assist with discharge planning needs.   Update- CSW started insurance authorization for patient  Auth ID# [id number].   Expected Discharge Plan: Skilled Nursing Facility Barriers to  Discharge: Continued Medical Work up   Patient Goals and CMS Choice Patient states their goals for this hospitalization and ongoing recovery are:: SNF   Choice offered to / list presented to : Patient, Adult Children      Expected Discharge Plan and Services In-house Referral: Clinical Social Work     Living arrangements for the past 2 months:  (from [facility] for short term rehab , lives at home alone) Expected Discharge Date: 04/01/24                                    Prior Living Arrangements/Services Living arrangements for the past 2 months:  (from [facility] for short term rehab , lives at home alone) Lives with:: Self Patient language and need for interpreter reviewed:: Yes Do you feel safe going back to the place where you live?: No   SNF  Need for Family Participation in Patient Care: Yes (Comment) Care giver support system in place?: Yes (comment)   Criminal Activity/Legal Involvement Pertinent to Current Situation/Hospitalization: No - Comment as needed  Activities of Daily Living      Permission Sought/Granted Permission sought to share information with : Case Manager, Magazine Features Editor, Family Supports Permission granted to share information with : Yes, Verbal Permission Granted  Share Information with NAME: Grayce  Permission granted to share info w AGENCY: SNF  Permission granted to share info w Relationship: daughter  Permission granted to share info w Contact Information: Grayce Reasons (daughter) [phone]  Emotional Assessment  Attitude/Demeanor/Rapport: Gracious Affect (typically observed): Calm Orientation: : Oriented to Self, Oriented to Place, Oriented to  Time, Oriented to Situation Alcohol / Substance Use: Not Applicable Psych Involvement: No (comment)  Admission diagnosis:  GIB (gastrointestinal bleeding) [K92.2] Gastrointestinal hemorrhage, unspecified gastrointestinal hemorrhage type [K92.2] Patient Active  Problem List   Diagnosis Date Noted   History of stroke 03/31/2024   Acute blood loss anemia 03/31/2024   Hiatal hernia 03/31/2024   GIB (gastrointestinal bleeding) 03/30/2024   Delirium 03/22/2024   Rhabdomyolysis 03/19/2024   Fall at home, initial encounter 03/19/2024   Depression with anxiety 03/19/2024   RLS (restless legs syndrome) 03/19/2024   Overweight (BMI 25.0-29.9) 03/19/2024   Complicated UTI (urinary tract infection) 07/30/2022   Diarrhea 07/30/2022   Bacteremia due to Escherichia coli 07/29/2022   Hyponatremia 07/27/2022   Intractable diarrhea 07/27/2022   Acute renal failure 07/27/2022   Syncope 01/29/2020   Intracranial vascular stenosis 01/29/2020   Falls frequently 12/17/2019   Seizure-like activity (HCC) 12/17/2019   Weakness 12/17/2019   Macular puckering, right eye 05/18/2019   Mechanical breakdown of intraocular lens 05/18/2019   Primary open angle glaucoma (POAG) of both eyes, moderate stage 05/18/2019   Pseudophakia of both eyes 05/18/2019   Status post total hip replacement, left 11/08/2017   SOBOE (shortness of breath on exertion) 10/04/2017   Umbilical hernia without obstruction and without gangrene 07/28/2016   Restless leg 10/21/2015   Urinary frequency 10/15/2015   Dysuria 10/15/2015   Bursitis, trochanteric 04/02/2015   Trochanteric bursitis of right hip 04/02/2015   Hypothyroidism 03/04/2015   GERD without esophagitis 03/04/2015   Subarachnoid hemorrhage (HCC) 02/09/2015   Degenerative arthritis of lumbar spine 01/29/2015   DDD (degenerative disc disease), lumbar 01/29/2015   Arthritis, degenerative 01/28/2014   Infection with methicillin-resistant Staphylococcus aureus 01/28/2014   Essential hypertension 01/28/2014   Dyslipidemia 01/28/2014   Carcinoma in situ, breast, ductal 01/28/2014   Depression 01/28/2014   Aneurysm, cerebral 01/28/2014   Intraductal carcinoma of breast 01/28/2014   Lumbar canal stenosis 12/07/2013   Neuritis or  radiculitis due to rupture of lumbar intervertebral disc 12/07/2013   Cramp in lower leg 06/19/2013   Hemorrhoids that prolapse with straining, but retract spontaneously 01/24/2013   Scalp lesion 11/27/2012   Rectal prolapse 11/27/2012   Hemorrhoid 08/29/2012   Screening breast examination 07/12/2012   FOM (frequency of micturition) 02/28/2012   Urge incontinence 02/28/2012   History of neoplasm of bladder 02/28/2012   Excessive urination at night 02/28/2012   Bladder CA in situ 02/28/2012   History of methicillin resistant Staphylococcus aureus infection 12/30/2011   H/O infectious disease 12/30/2011   Cutaneous ulcer (HCC) 11/26/2011   Skin ulcer (HCC) 11/26/2011   Colon polyp 11/11/2011   Absolute anemia 11/11/2011   Back pain, chronic 11/01/2011   Abscess, gluteal, right 10/10/2011   Abscess of buttock 10/10/2011   Degenerative joint disease    Cerebrovascular disease    MRSA (methicillin resistant Staphylococcus aureus)    Hyperlipidemia with target low density lipoprotein (LDL) cholesterol less than 100 mg/dL 88/97/7987   Bladder cancer (HCC) 10/29/2009   Malignant neoplasm of urinary bladder (HCC) 10/29/2009   PCP:  Ileen [name], NP Pharmacy:   [facility] - [place], [place] - [address] [place] [address] Phone: [phone] Fax: [phone]  [facility] - [place], [place] - [address] [place] [address] [place] [address] [place] [address] Phone: [phone] Fax: [phone]  Social Drivers of Health (SDOH) Social History: SDOH Screenings   Food Insecurity: No Food Insecurity (03/31/2024)  Housing: Low Risk  (03/31/2024)  Transportation Needs: No Transportation Needs (03/31/2024)  Utilities: Not At Risk (03/31/2024)  Social Connections: Moderately Isolated (03/31/2024)  Tobacco Use: Low Risk  (03/30/2024)   SDOH Interventions:     Readmission Risk Interventions    07/28/2022    9:46 AM  Readmission Risk  Prevention Plan  Transportation Screening Complete  PCP or Specialist Appt within 3-5 Days Complete  HRI or Home Care Consult Complete  Medication Review (RN Care Manager) Complete

## 2024-04-01 NOTE — Evaluation (Signed)
 Occupational Therapy Evaluation Patient Details Name: Jodi Colon MRN: [id number] DOB: 07-01-1929 Today's Date: 04/01/2024   History of Present Illness   89 y.o. female presents to [facility] 03/30/24 from SNF with nausea and high BP. Pt with melena and acute on chronic anemia. DDX includes esophagitis versus camerons erosions secondary to hiatal hernia. PMH of bladder cancer diagnosed with high-grade TA TCC of the bladder in 2000 (s/p of BCG and intra bladder chemotherapy), h/o CVA, h/o subarachnoid hemorrhage/brain aneurysm (followed by Neurosurgery), brain aneurysm, CVD s/p PTCA R MCA,  HTN,  HLD, anemia, cataract, GERD, osteopenia, HLD, RLS, spinal stenosis, thyroid  disease, h/o urinary incontinence, depression with anxiety, hyponatremia, breast cancer (s/p of right lumpectomy and radiation therapy.     Clinical Impressions Pt reports having assist at baseline for ADLs and was using a rollator at SNF. Pt currently needs up to max A for ADLs, CGA for bed mobility and min-mod A for transfers with RW. Pt with posterior lean in standing and with ambulation, cues and physical assist to weight shift forward. Pt able to ambulate to sink to perform seated grooming tasks. Pt presenting with impairments listed below, will follow acutely. Patient will benefit from continued inpatient follow up therapy, <3 hours/day to maximize safety/ind with ADL/functional mobility.      If plan is discharge home, recommend the following:   A lot of help with bathing/dressing/bathroom;A lot of help with walking and/or transfers;Assistance with cooking/housework;Help with stairs or ramp for entrance     Functional Status Assessment   Patient has had a recent decline in their functional status and demonstrates the ability to make significant improvements in function in a reasonable and predictable amount of time.     Equipment Recommendations   Other (comment) (defer)     Recommendations for Other Services    PT consult     Precautions/Restrictions   Precautions Precautions: Fall Recall of Precautions/Restrictions: Intact Precaution/Restrictions Comments: bladder incontinence Restrictions Weight Bearing Restrictions Per Provider Order: No     Mobility Bed Mobility Overal bed mobility: Needs Assistance       Supine to sit: Contact guard Sit to supine: Contact guard assist        Transfers Overall transfer level: Needs assistance Equipment used: Rolling walker (2 wheels) Transfers: Sit to/from Stand Sit to Stand: Min assist, Mod assist           General transfer comment: heavy posterior lean      Balance Overall balance assessment: Needs assistance, History of Falls Sitting-balance support: No upper extremity supported, Feet supported Sitting balance-Leahy Scale: Fair     Standing balance support: Bilateral upper extremity supported, Reliant on assistive device for balance Standing balance-Leahy Scale: Poor Standing balance comment: posterior lean requiring external and UE support                           ADL either performed or assessed with clinical judgement   ADL Overall ADL's : Needs assistance/impaired Eating/Feeding: Set up;Sitting   Grooming: Set up;Sitting   Upper Body Bathing: Moderate assistance;Sitting   Lower Body Bathing: Maximal assistance;Sitting/lateral leans   Upper Body Dressing : Moderate assistance;Sitting   Lower Body Dressing: Maximal assistance;Sitting/lateral leans   Toilet Transfer: Minimal assistance;Ambulation;Rolling walker (2 wheels)   Toileting- Clothing Manipulation and Hygiene: Moderate assistance       Functional mobility during ADLs: Minimal assistance;Rolling walker (2 wheels)       Vision   Vision Assessment?: Vision impaired-  to be further tested in functional context Additional Comments: L eye with vision deficits at baseline     Perception Perception: Not tested       Praxis Praxis: Not  tested       Pertinent Vitals/Pain Pain Assessment Pain Assessment: No/denies pain     Extremity/Trunk Assessment Upper Extremity Assessment Upper Extremity Assessment: Generalized weakness   Lower Extremity Assessment Lower Extremity Assessment: Defer to PT evaluation   Cervical / Trunk Assessment Cervical / Trunk Assessment: Kyphotic   Communication Communication Communication: Impaired Factors Affecting Communication: Hearing impaired;Reduced clarity of speech   Cognition Arousal: Alert Behavior During Therapy: WFL for tasks assessed/performed, Flat affect Cognition: No family/caregiver present to determine baseline                               Following commands: Intact Following commands impaired: Follows one step commands with increased time     Cueing  General Comments   Cueing Techniques: Verbal cues;Tactile cues;Visual cues  VSS   Exercises     Shoulder Instructions      Home Living Family/patient expects to be discharged to:: Skilled nursing facility                                        Prior Functioning/Environment               Mobility Comments: RW for mobility ADLs Comments: assist for ADLs at SNF    OT Problem List: Decreased strength;Decreased activity tolerance;Impaired balance (sitting and/or standing);Decreased safety awareness   OT Treatment/Interventions: Self-care/ADL training;Therapeutic exercise;Patient/family education;Balance training;Therapeutic activities;DME and/or AE instruction      OT Goals(Current goals can be found in the care plan section)   Acute Rehab OT Goals Patient Stated Goal: none stated OT Goal Formulation: With patient Time For Goal Achievement: 04/15/24 Potential to Achieve Goals: Good   OT Frequency:  Min 2X/week    Co-evaluation              AM-PAC OT 6 Clicks Daily Activity     Outcome Measure Help from another person eating meals?: A Little Help from  another person taking care of personal grooming?: A Little Help from another person toileting, which includes using toliet, bedpan, or urinal?: A Lot Help from another person bathing (including washing, rinsing, drying)?: A Lot Help from another person to put on and taking off regular upper body clothing?: A Little Help from another person to put on and taking off regular lower body clothing?: A Lot 6 Click Score: 15   End of Session Equipment Utilized During Treatment: Gait belt;Rolling walker (2 wheels) Nurse Communication: Mobility status  Activity Tolerance: Patient tolerated treatment well Patient left: in bed;with call bell/phone within reach;with bed alarm set  OT Visit Diagnosis: Other abnormalities of gait and mobility (R26.89);Muscle weakness (generalized) (M62.81);Unsteadiness on feet (R26.81);Repeated falls (R29.6)                Time: 9194-9169 OT Time Calculation (min): 25 min Charges:  OT General Charges $OT Visit: 1 Visit OT Evaluation $OT Eval Moderate Complexity: 1 Mod OT Treatments $Self Care/Home Management : 8-22 mins  Jesyca Weisenburger K, OTD, OTR/L [employer] [facility] Acute [facility] [phone]   Peighton Mehra K Koonce 04/01/2024, 9:07 AM

## 2024-04-01 NOTE — Discharge Summary (Signed)
 Jodi Colon FMW:[id number] DOB: 1930/03/16 DOA: 03/30/2024  PCP: Ileen Jodi NOVAK, NP  Admit date: 03/30/2024  Discharge date: 04/02/2024  Admitted From: SNF   Disposition:  SNF - original DC 04/01/24- leaving 04/02/24 ( insurance approval)   Recommendations for Outpatient Follow-up:   Follow up with PCP in 1-2 weeks  PCP Please obtain BMP/CBC, 2 view CXR in 1week,  (see Discharge instructions)   PCP Please follow up on the following pending results:    Home Health: None   Equipment/Devices: None  Consultations: GI Discharge Condition: Stable    CODE STATUS: Full    Diet Recommendation: Heart Healthy     No chief complaint on file.    Brief history of present illness from the day of admission and additional interim summary    89 y.o. female with medical history significant of bladder cancer diagnosed with high-grade TA TCC of the bladder in 2000 (s/p of BCG and intra bladder chemotherapy), h/o CVA, h/o subarachnoid hemorrhage/brain aneurysm (followed by Neurosurgery), brain aneurysm, CVD s/p PTCA R MCA, HTN, HLD, anemia, cataract, GERD, osteopenia, HLD, RLS, spinal stenosis, thyroid  disease, h/o urinary incontinence, depression with anxiety, hyponatremia, breast cancer (s/p of right lumpectomy and radiation therapy), and recent admission at [facility] from 10/20-23 for recurrent falls and found to have rhabdomyolysis that self resolved c/b hyponatremia (attributed to SSRI use) and old infarcts on MRI who p/w some nausea, and not feeling well, she had melanotic stool which was guaiac positive and was sent to the hospital for further workup.                                                                  Hospital Course   Melanotic stool.  Could have had upper GI bleed, CBC has dropped somewhat but stable at 9,  also some element of him dilution from IV fluids, on PPI seen by GI family prefers conservative management, was treated conservatively with PPI, no further episodes of melena, H&H stable no transfusions required, will be discharged on twice daily PPI with outpatient PCP and GI follow-up.  Patient is currently symptom-free.  Plan was discussed with patient's daughter on 03/31/2024.   History of bladder cancer.  Stable outpatient follow-up.   History of brain aneurysm.  No acute issues outpatient follow-up with neurosurgery postdischarge.   History of falls and rhabdomyolysis.  SNF  Discharge diagnosis     Principal Problem:   GIB (gastrointestinal bleeding) Active Problems:   History of stroke   Acute blood loss anemia   Hiatal hernia    Discharge instructions    Discharge Instructions     Diet - low sodium heart healthy   Complete by: As directed    Discharge instructions   Complete by: As directed    Follow with Primary MD  [name], Jodi NOVAK, NP in 7 days   Get CBC, CMP, Magnesium , 2 view Chest X ray -  checked next visit with your primary MD or SNF MD   Activity: As tolerated with Full fall precautions use walker/cane & assistance as needed  Disposition SNF  Diet: Heart Healthy    Special Instructions: If you have smoked or chewed Tobacco  in the last 2 yrs please stop smoking, stop any regular Alcohol  and or any Recreational drug use.  On your next visit with your primary care physician please Get Medicines reviewed and adjusted.  Please request your Prim.MD to go over all Hospital Tests and Procedure/Radiological results at the follow up, please get all Hospital records sent to your Prim MD by signing hospital release before you go home.  If you experience worsening of your admission symptoms, develop shortness of breath, life threatening emergency, suicidal or homicidal thoughts you must seek medical attention immediately by calling 911 or calling your MD  immediately  if symptoms less severe.  You Must read complete instructions/literature along with all the possible adverse reactions/side effects for all the Medicines you take and that have been prescribed to you. Take any new Medicines after you have completely understood and accpet all the possible adverse reactions/side effects.   Do not drive when taking Pain medications.  Do not take more than prescribed Pain, Sleep and Anxiety Medications  Wear Seat belts while driving.   Increase activity slowly   Complete by: As directed        Discharge Medications   Allergies as of 04/02/2024       Reactions   Penicillins Rash   Has patient had a PCN reaction causing immediate rash, facial/tongue/throat swelling, SOB or lightheadedness with hypotension: Yes Has patient had a PCN reaction causing severe rash involving mucus membranes or skin necrosis: No Has patient had a PCN reaction that required hospitalization: No Has patient had a PCN reaction occurring within the last 10 years: No If all of the above answers are NO, then may proceed with Cephalosporin use. **Occurred as an adult & full body rash   Sulfa Antibiotics Swelling, Rash   Facial swelling   Ciprofloxacin     Unknown reaction per MAR   Sulfamethoxazole Rash   Sulfasalazine Swelling, Rash   swelling of face        Medication List     STOP taking these medications    cefUROXime 500 MG tablet Commonly known as: CEFTIN   famotidine 40 MG tablet Commonly known as: PEPCID       TAKE these medications    atorvastatin  10 MG tablet Commonly known as: LIPITOR  Take 10 mg by mouth every morning.   fluticasone  50 MCG/ACT nasal spray Commonly known as: FLONASE  Place 1 spray into both nostrils daily as needed for allergies or rhinitis.   hydrALAZINE  50 MG tablet Commonly known as: APRESOLINE  Take 1 tablet (50 mg total) by mouth 2 (two) times daily. What changed: when to take this   hydrOXYzine  10 MG  tablet Commonly known as: ATARAX  Take 25 mg by mouth daily.   levothyroxine  75 MCG tablet Commonly known as: SYNTHROID  Take 1 tablet (75 mcg total) by mouth daily at 6 (six) AM.   loratadine  10 MG tablet Commonly known as: CLARITIN  Take 10 mg by mouth daily.   multivitamin tablet Take 1 tablet by mouth daily.   pantoprazole  40 MG tablet Commonly known as: Protonix  Take 1 tablet (40 mg total) by mouth 2 (  two) times daily before a meal.   rOPINIRole  0.25 MG tablet Commonly known as: REQUIP  Take 1 tablet (0.25 mg total) by mouth 2 (two) times daily. What changed: when to take this   saccharomyces boulardii 250 MG capsule Commonly known as: FLORASTOR Take 250 mg by mouth every morning.   trospium  20 MG tablet Commonly known as: SANCTURA  Take 1 tablet (20 mg total) by mouth 2 (two) times daily.   venlafaxine  75 MG tablet Commonly known as: EFFEXOR  Take 75 mg by mouth daily.   Vitamin D  (Ergocalciferol ) 1.25 MG (50000 UNIT) Caps capsule Commonly known as: DRISDOL  Take 1 capsule (50,000 Units total) by mouth every 7 (seven) days.          Contact information for follow-up providers     [name], Jodi NOVAK, NP. Schedule an appointment as soon as possible for a visit in 1 week(s).   Specialty: Nurse Practitioner Contact information: [address] [place] [address] [phone]         [facility]. Schedule an appointment as soon as possible for a visit in 2 week(s).   Specialty: Gastroenterology Contact information: [address] [place]  [address] [phone]             Contact information for after-discharge care     Destination     [facility]   Service: Skilled Nursing Contact information: [address] [place]  [phone]                     Major procedures and Radiology Reports - PLEASE  review detailed and final reports thoroughly  -      CT Head Wo Contrast Result Date: 03/30/2024 EXAM: CT HEAD WITHOUT CONTRAST 03/30/2024 12:13:00 PM TECHNIQUE: CT of the head was performed without the administration of intravenous contrast. Automated exposure control, iterative reconstruction, and/or weight based adjustment of the mA/kV was utilized to reduce the radiation dose to as low as reasonably achievable. COMPARISON: CT of the head dated 03/19/2024. CLINICAL HISTORY: Mental status change, unknown cause. FINDINGS: BRAIN AND VENTRICLES: No acute hemorrhage. No evidence of acute infarct. Moderate generalized volume loss. Moderate periventricular and deep cerebral white matter disease. Chronic lacunar infarcts present within the caudate nuclei bilaterally. Chronic lacunar infarct is also noted within the right thalamus. A stent is present within the right middle cerebral artery. No hydrocephalus. No extra-axial collection. No mass effect or midline shift. The study is mildly degraded by patient motion but is significantly improved relative to the previous study. ORBITS: The patient is status post bilateral lens replacement. No acute abnormality. SINUSES: No acute abnormality. SOFT TISSUES AND SKULL: No acute soft tissue abnormality. No skull fracture. IMPRESSION: 1. No acute intracranial abnormality. 2. Moderate generalized volume loss and moderate periventricular and deep cerebral white matter disease. 3. Chronic lacunar infarcts in the caudate nuclei bilaterally and right thalamus. 4. Right middle cerebral artery stent. Electronically signed by: Evalene Coho MD 03/30/2024 12:27 PM EDT RP Workstation: [id number]   DG Chest Port 1 View Result Date: 03/30/2024 EXAM: 1 VIEW(S) XRAY OF THE CHEST 03/30/2024 11:50:00 AM COMPARISON: None available. CLINICAL HISTORY: chest pain FINDINGS: LUNGS AND PLEURA: Mild chronic coarsened interstitial markings without pulmonary edema. No focal pulmonary opacity. No  pleural effusion. No pneumothorax. HEART AND MEDIASTINUM: Aortic calcification. BONES AND SOFT TISSUES: No acute osseous abnormality. IMPRESSION: 1. No acute cardiopulmonary process. 2. Chronic coarsened interstitial markings without pulmonary  edema. Electronically signed by: Norleen Boxer MD 03/30/2024 12:20 PM EDT RP Workstation: [id number]   MR BRAIN WO CONTRAST Result Date: 03/22/2024 EXAM: MRI BRAIN WITHOUT CONTRAST 03/21/2024 11:57:21 PM TECHNIQUE: Multiplanar multisequence MRI of the head/brain was performed without the administration of intravenous contrast. COMPARISON: 12/06/2019 . CLINICAL HISTORY: Delirium. FINDINGS: BRAIN AND VENTRICLES: No acute infarct. No intracranial hemorrhage. No mass. No midline shift. No hydrocephalus. Early confluent hyperintense T2-weighted signal within the cerebral white matter, most commonly due to chronic small vessel disease. Right cerebellar small vessel infarct. Old bilateral basal ganglia small vessel infarcts. Right MCA stent. The sellar/suprasellar regions appear unremarkable. Normal flow voids. ORBITS: No acute abnormality. SINUSES AND MASTOIDS: No acute abnormality. BONES AND SOFT TISSUES: Normal marrow signal. No acute soft tissue abnormality. IMPRESSION: 1. No acute intracranial abnormality. 2. Right cerebellar small vessel infarct and old bilateral basal ganglia small vessel infarcts. 3. Early confluent T2 hyperintense white matter signal consistent with chronic small vessel disease. Electronically signed by: Franky Stanford MD 03/22/2024 12:21 AM EDT RP Workstation: [id number]   CT Head Wo Contrast Result Date: 03/19/2024 EXAM: CT HEAD AND CERVICAL SPINE 03/19/2024 08:39:04 PM TECHNIQUE: CT of the head and cervical spine was performed without the administration of intravenous contrast. Multiplanar reformatted images are provided for review. Automated exposure control, iterative reconstruction, and/or weight based adjustment of the mA/kV was utilized to  reduce the radiation dose to as low as reasonably achievable. COMPARISON: CT head 08/20/2022 CLINICAL HISTORY: Fall. Pt arrives to ED from home via [facility] with c/c of generalized weakness and multiple falls. EMS reports fall on Thursday and last night. Pt found today on floor at 1515. EMS reports transport vitals of 132/52, p 94, r18, O2 Sat 99%. Pt complaining of upper left leg pain. Denies hitting head or loss of consciousness. At time of triage Pt A\T\Ox4, NAD. Multiple attempts due to pt movement. FINDINGS: CT HEAD BRAIN AND VENTRICLES: No acute intracranial hemorrhage. No mass effect or midline shift. No abnormal extra-axial fluid collection. No evidence of acute infarct. No hydrocephalus. Patchy and confluent areas of decreased attenuation are noted throughout the deep and periventricular white matter of the cerebral hemispheres bilaterally, suggestive of chronic microvascular ischemic changes. Right MCA stent noted. Diffuse cerebral and cerebellar atrophy. Slight limitation of evaluation of the vertex due to streak artifacts and motion artifacts. ORBITS: No acute abnormality. Bilateral lens replacement. SINUSES AND MASTOIDS: No acute abnormality. SOFT TISSUES AND SKULL: No acute skull fracture. No acute soft tissue abnormality. Atherosclerotic plaque of the carotid arteries within the neck. CT CERVICAL SPINE BONES AND ALIGNMENT: No acute fracture or traumatic malalignment. DEGENERATIVE CHANGES: Multilevel mild-to-moderate degenerative changes of the spine. No associated severe osseous neural foraminal or central canal stenosis. SOFT TISSUES: No prevertebral soft tissue swelling. LUNGS: Biapical pleural and pulmonary scarring with associated calcifications. VASCULATURE: Atherosclerotic plaque of the aortic arch and its main branches. IMPRESSION: 1. No acute intracranial abnormality. 2. No acute fracture or traumatic malalignment of the cervical spine. Electronically signed by: Morgane Naveau MD 03/19/2024  08:47 PM EDT RP Workstation: [id number]   CT Cervical Spine Wo Contrast Result Date: 03/19/2024 EXAM: CT HEAD AND CERVICAL SPINE 03/19/2024 08:39:04 PM TECHNIQUE: CT of the head and cervical spine was performed without the administration of intravenous contrast. Multiplanar reformatted images are provided for review. Automated exposure control, iterative reconstruction, and/or weight based adjustment of the mA/kV was utilized to reduce the radiation dose to as low as reasonably achievable. COMPARISON: CT head 08/20/2022 CLINICAL HISTORY: Fall.  Pt arrives to ED from home via [facility] with c/c of generalized weakness and multiple falls. EMS reports fall on Thursday and last night. Pt found today on floor at 1515. EMS reports transport vitals of 132/52, p 94, r18, O2 Sat 99%. Pt complaining of upper left leg pain. Denies hitting head or loss of consciousness. At time of triage Pt A\T\Ox4, NAD. Multiple attempts due to pt movement. FINDINGS: CT HEAD BRAIN AND VENTRICLES: No acute intracranial hemorrhage. No mass effect or midline shift. No abnormal extra-axial fluid collection. No evidence of acute infarct. No hydrocephalus. Patchy and confluent areas of decreased attenuation are noted throughout the deep and periventricular white matter of the cerebral hemispheres bilaterally, suggestive of chronic microvascular ischemic changes. Right MCA stent noted. Diffuse cerebral and cerebellar atrophy. Slight limitation of evaluation of the vertex due to streak artifacts and motion artifacts. ORBITS: No acute abnormality. Bilateral lens replacement. SINUSES AND MASTOIDS: No acute abnormality. SOFT TISSUES AND SKULL: No acute skull fracture. No acute soft tissue abnormality. Atherosclerotic plaque of the carotid arteries within the neck. CT CERVICAL SPINE BONES AND ALIGNMENT: No acute fracture or traumatic malalignment. DEGENERATIVE CHANGES: Multilevel mild-to-moderate degenerative changes of the spine. No associated severe  osseous neural foraminal or central canal stenosis. SOFT TISSUES: No prevertebral soft tissue swelling. LUNGS: Biapical pleural and pulmonary scarring with associated calcifications. VASCULATURE: Atherosclerotic plaque of the aortic arch and its main branches. IMPRESSION: 1. No acute intracranial abnormality. 2. No acute fracture or traumatic malalignment of the cervical spine. Electronically signed by: Morgane Naveau MD 03/19/2024 08:47 PM EDT RP Workstation: [id number]   DG HIP UNILAT WITH PELVIS 2-3 VIEWS RIGHT Result Date: 03/19/2024 CLINICAL DATA:  221993 Right hip pain 221993 EXAM: DG HIP (WITH OR WITHOUT PELVIS) 2-3V RIGHT COMPARISON:  02/09/2015 FINDINGS: Osteopenia.No evidence of pelvic fracture or diastasis. Left hip arthroplasty is well aligned. No acute hip fracture or dislocation.Moderate joint space loss of the right hip. Multilevel degenerative disc disease of the spine. Surgical staple line in the pelvis. Aortoiliac and peripheral vascular atherosclerosis. IMPRESSION: 1. Osteopenia. No acute fracture, pelvic bone diastasis, or dislocation. 2. Moderate osteoarthritis of the right hip. Electronically Signed   By: Rogelia Myers M.D.   On: 03/19/2024 18:50    Micro Results    No results found for this or any previous visit (from the past 240 hours).   Today   Subjective    Laquinda Moller today has no headache,no chest abdominal pain,no new weakness tingling or numbness, feels much better wants to go home today.    Objective   Blood pressure (!) 100/47, pulse 93, temperature 98 F (36.7 C), resp. rate 18, height 4' 9 (1.448 m), weight 52 kg, SpO2 91%.   Intake/Output Summary (Last 24 hours) at 04/02/2024 1200 Last data filed at 04/02/2024 0402 Gross per 24 hour  Intake --  Output 1550 ml  Net -1550 ml    Exam  Awake Alert, No new F.N deficits,    [place].AT,PERRAL Supple Neck,   Symmetrical Chest wall movement, Good air movement bilaterally, CTAB RRR,No Gallops,   +ve  B.Sounds, Abd Soft, Non tender,  No Cyanosis, Clubbing or edema    Data Review   Recent Labs  Lab 03/30/24 1123 03/30/24 1146 03/31/24 0403 03/31/24 0712 04/01/24 0437 04/02/24 0503  WBC 10.4  --  9.4 9.1 6.6 7.8  HGB 11.0* 11.2* 8.9* 9.1* 8.8* 9.8*  HCT 33.1* 33.0* 26.8* 27.9* 27.1* 29.8*  PLT 288  --  205 201 203  248  MCV 87.8  --  87.9 89.1 88.9 88.4  MCH 29.2  --  29.2 29.1 28.9 29.1  MCHC 33.2  --  33.2 32.6 32.5 32.9  RDW 13.5  --  13.6 13.4 13.7 13.7  LYMPHSABS 1.0  --   --  1.3 1.2 1.6  MONOABS 0.7  --   --  0.8 0.7 0.7  EOSABS 0.1  --   --  0.7* 0.7* 0.7*  BASOSABS 0.0  --   --  0.0 0.0 0.0    Recent Labs  Lab 03/30/24 1323 03/31/24 0403 03/31/24 0712 04/01/24 0437 04/02/24 0503  NA 135 134* 134* 133* 133*  K 3.3* 3.4* 3.9 4.1 4.0  CL 97* 102 101 100 100  CO2 24 22 21* 22 22  ANIONGAP 14 10 12 11 11   GLUCOSE 127* 100* 104* 100* 105*  BUN 32* 22 19 14 14   CREATININE 0.70 0.75 0.69 0.76 0.82  AST 23  --   --   --   --   ALT 26  --   --   --   --   ALKPHOS 98  --   --   --   --   BILITOT 0.5  --   --   --   --   ALBUMIN 3.0*  --   --   --   --   MG  --   --  1.7 1.9 1.8  CALCIUM  9.2 8.0* 8.1* 8.0* 8.2*    Total Time in preparing paper work, data evaluation and todays exam - 35 minutes  Signature  -    Lavada Stank M.D on 04/02/2024 at 12:00 PM   -  To page go to [email or web address]

## 2024-04-02 ENCOUNTER — Encounter: Payer: Self-pay | Admitting: Physician Assistant

## 2024-04-02 DIAGNOSIS — K921 Melena: Secondary | ICD-10-CM | POA: Diagnosis not present

## 2024-04-02 DIAGNOSIS — K449 Diaphragmatic hernia without obstruction or gangrene: Secondary | ICD-10-CM | POA: Diagnosis not present

## 2024-04-02 DIAGNOSIS — Z8673 Personal history of transient ischemic attack (TIA), and cerebral infarction without residual deficits: Secondary | ICD-10-CM | POA: Diagnosis not present

## 2024-04-02 DIAGNOSIS — D62 Acute posthemorrhagic anemia: Secondary | ICD-10-CM | POA: Diagnosis not present

## 2024-04-02 LAB — BASIC METABOLIC PANEL WITH GFR
Anion gap: 11 (ref 5–15)
BUN: 14 mg/dL (ref 8–23)
CO2: 22 mmol/L (ref 22–32)
Calcium: 8.2 mg/dL — ABNORMAL LOW (ref 8.9–10.3)
Chloride: 100 mmol/L (ref 98–111)
Creatinine, Ser: 0.82 mg/dL (ref 0.44–1.00)
GFR, Estimated: >60 mL/min (ref >60)
Glucose, Bld: 105 mg/dL — ABNORMAL HIGH (ref 70–99)
Potassium: 4 mmol/L (ref 3.5–5.1)
Sodium: 133 mmol/L — ABNORMAL LOW (ref 135–145)

## 2024-04-02 LAB — CBC WITH DIFFERENTIAL/PLATELET
Abs Immature Granulocytes: 0.02 K/uL (ref 0.00–0.07)
Basophils Absolute: 0 K/uL (ref 0.0–0.1)
Basophils Relative: 1 %
Eosinophils Absolute: 0.7 K/uL — ABNORMAL HIGH (ref 0.0–0.5)
Eosinophils Relative: 8 %
HCT: 29.8 % — ABNORMAL LOW (ref 36.0–46.0)
Hemoglobin: 9.8 g/dL — ABNORMAL LOW (ref 12.0–15.0)
Immature Granulocytes: 0 %
Lymphocytes Relative: 20 %
Lymphs Abs: 1.6 K/uL (ref 0.7–4.0)
MCH: 29.1 pg (ref 26.0–34.0)
MCHC: 32.9 g/dL (ref 30.0–36.0)
MCV: 88.4 fL (ref 80.0–100.0)
Monocytes Absolute: 0.7 K/uL (ref 0.1–1.0)
Monocytes Relative: 9 %
Neutro Abs: 4.8 K/uL (ref 1.7–7.7)
Neutrophils Relative %: 62 %
Platelets: 248 K/uL (ref 150–400)
RBC: 3.37 MIL/uL — ABNORMAL LOW (ref 3.87–5.11)
RDW: 13.7 % (ref 11.5–15.5)
WBC: 7.8 K/uL (ref 4.0–10.5)
nRBC: 0 % (ref 0.0–0.2)

## 2024-04-02 LAB — MAGNESIUM: Magnesium: 1.8 mg/dL (ref 1.7–2.4)

## 2024-04-02 MED ORDER — DIPHENHYDRAMINE HCL 25 MG PO CAPS
25.0000 mg | ORAL_CAPSULE | Freq: Four times a day (QID) | ORAL | Status: DC | PRN
Start: 2024-04-02 — End: 2024-04-02
  Administered 2024-04-02 (×2): 25 mg via ORAL
  Filled 2024-04-02 (×2): qty 1

## 2024-04-02 NOTE — Progress Notes (Signed)
 Report given to Corean, LPN at [facility].

## 2024-04-02 NOTE — Plan of Care (Signed)

## 2024-04-02 NOTE — Progress Notes (Signed)
 [facility]                                                                                                                                                                         Patient Demographics  Jodi Colon, is a 89 y.o. female  RDW:[id number]  FMW:[id number]  DOB - Oct 25, 1929  Admit date - 03/30/2024  Admitting Physician Marsha Ada, MD  Outpatient Primary MD for the patient is [name], Jodi NOVAK, NP  LOS - 3   No chief complaint on file.       Assessment & Plan    Patient seen briefly today due for discharge soon per Discharge done yesterday by me, no further issues, Vital signs stable, patient feels fine.  We are waiting for an appropriate bed for her, apparently there is some dispute between her insurance company and the previous facility in terms of clearance of payment.    Medications  Scheduled Meds:  atorvastatin   10 mg Oral BH-q7a   hydrALAZINE   50 mg Oral TID   levothyroxine   75 mcg Oral Q0600   magic mouthwash  5 mL Oral TID   pantoprazole  (PROTONIX ) IV  40 mg Intravenous Q12H   rOPINIRole   0.25 mg Oral BID   Continuous Infusions: PRN Meds:.diphenhydrAMINE     Time Spent in minutes   10 minutes   Lavada Stank M.D on 04/02/2024 at 8:56 AM  Between 7am to 7pm - Pager - [phone]  After 7pm go to [email or web address] - password [email or web address]  And look for the night coverage person covering for me after hours  [facility] Office  [phone]    Subjective:   Jodi Colon today has, No headache, No chest pain, No abdominal pain - No Nausea, No new weakness tingling or numbness, No Cough - SOB.  No blood in stool or dark stool.  Objective:   Vitals:   04/01/24 1947 04/02/24 0004 04/02/24 0359 04/02/24 0728  BP: (!) 134/48 (!) 128/56 (!) 121/43 (!) 135/43  Pulse: 93 89 82 93  Resp: 18 18 18 18   Temp: 98.6 F (37 C) 97.9 F (36.6 C) 97.9 F (36.6 C) 98.2  F (36.8 C)  TempSrc: Oral Oral Oral Oral  SpO2: 95% 95% 95% 91%  Weight:      Height:        Wt Readings from Last 3 Encounters:  03/30/24 52 kg  03/19/24 52.6 kg  02/16/24 52.2 kg     Intake/Output Summary (Last 24 hours) at 04/02/2024 0856 Last data filed at 04/02/2024 0402 Gross per 24 hour  Intake --  Output 1550 ml  Net -1550 ml    Exam  Awake Alert, No new F.N deficits, Normal affect [place].AT,PERRAL Supple Neck, No JVD,  Symmetrical Chest wall movement, Good air movement bilaterally, CTAB RRR,No Gallops, Rubs or new Murmurs,  +ve B.Sounds, Abd Soft, No tenderness,   No Cyanosis, Clubbing or edema   Data Review

## 2024-04-02 NOTE — Progress Notes (Signed)
 [facility] night cross cover note:   Per pt's request, I have ordered as needed p.o. Benadryl  for itching.    Eva Pore, DO Hospitalist

## 2024-04-02 NOTE — Care Management Important Message (Signed)
 Important Message  Patient Details  Name: Jodi Colon MRN: [id number] Date of Birth: [birth year]   Important Message Given:  Yes - Medicare IM     Claretta Deed 04/02/2024, 4:15 PM

## 2024-04-02 NOTE — TOC Transition Note (Signed)
 Transition of Care [facility]) - Discharge Note   Patient Details  Name: Jodi Colon MRN: [id number] Date of Birth: Sep 11, 1929  Transition of Care [facility]) CM/SW Contact:  Inocente GORMAN Kindle, LCSW Phone Number: 04/02/2024, 1:41 PM   Clinical Narrative:    Patient will DC to: [facility] Anticipated DC date: 04/02/24 Family notified: Daughter, [employer] by: ROME   Per MD patient ready for DC to [facility]. RN to call report prior to discharge [phone] room 1006b). RN, patient, patient's family, and facility notified of DC. Discharge Summary and FL2 sent to facility. DC packet on chart including signed DNR. Ambulance transport requested for patient.   CSW will sign off for now as social work intervention is no longer needed. Please consult us  again if new needs arise.     Final next level of care: Skilled Nursing Facility Barriers to Discharge: Barriers Resolved   Patient Goals and CMS Choice Patient states their goals for this hospitalization and ongoing recovery are:: SNF CMS Medicare.gov Compare Post Acute Care list provided to:: Patient Choice offered to / list presented to : Patient, Adult Children [place] ownership interest in [facility].provided to:: Patient    Discharge Placement   Existing PASRR number confirmed : 04/02/24          Patient chooses bed at: [facility] Patient to be transferred to facility by: [facility] Name of family member notified: Grayce, daughter Patient and family notified of of transfer: 04/02/24  Discharge Plan and Services Additional resources added to the After Visit Summary for   In-house Referral: Clinical Social Work                                   Social Drivers of Health (SDOH) Interventions SDOH Screenings   Food Insecurity: No Food Insecurity (03/31/2024)  Housing: Low Risk  (03/31/2024)  Transportation Needs: No Transportation Needs (03/31/2024)  Utilities: Not At Risk (03/31/2024)  Social  Connections: Moderately Isolated (03/31/2024)  Tobacco Use: Low Risk  (03/30/2024)     Readmission Risk Interventions    07/28/2022    9:46 AM  Readmission Risk Prevention Plan  Transportation Screening Complete  PCP or Specialist Appt within 3-5 Days Complete  HRI or Home Care Consult Complete  Medication Review (RN Care Manager) Complete

## 2024-04-02 NOTE — TOC Progression Note (Addendum)
 Transition of Care [facility]) - Progression Note    Patient Details  Name: Jodi Colon MRN: [id number] Date of Birth: [birth year]  Transition of Care [facility]) CM/SW Contact  Inocente GORMAN Kindle, LCSW Phone Number: 04/02/2024, 9:11 AM  Clinical Narrative:    9:11 AM-Insurance approval still pending for [facility].   12:11 PM-[facility] has received insurance approval, [id number], Auth ID# [id number] effective until 04/04/24. CSW spoke with patient's daughter and she was able to pay the balance at [facility]. [name] is not able to provide transport so daughter is in agreement with [facility].   Expected Discharge Plan: Skilled Nursing Facility Barriers to Discharge: Insurance Authorization               Expected Discharge Plan and Services In-house Referral: Clinical Social Work     Living arrangements for the past 2 months:  (from [facility] for short term rehab , lives at home alone) Expected Discharge Date: 04/01/24                                     Social Drivers of Health (SDOH) Interventions SDOH Screenings   Food Insecurity: No Food Insecurity (03/31/2024)  Housing: Low Risk  (03/31/2024)  Transportation Needs: No Transportation Needs (03/31/2024)  Utilities: Not At Risk (03/31/2024)  Social Connections: Moderately Isolated (03/31/2024)  Tobacco Use: Low Risk  (03/30/2024)    Readmission Risk Interventions    07/28/2022    9:46 AM  Readmission Risk Prevention Plan  Transportation Screening Complete  PCP or Specialist Appt within 3-5 Days Complete  HRI or Home Care Consult Complete  Medication Review (RN Care Manager) Complete

## 2024-04-03 LAB — HAPTOGLOBIN: Haptoglobin: 272 mg/dL (ref 41–333)

## 2024-04-09 ENCOUNTER — Ambulatory Visit (INDEPENDENT_AMBULATORY_CARE_PROVIDER_SITE_OTHER): Admitting: Podiatry

## 2024-04-09 DIAGNOSIS — Z91198 Patient's noncompliance with other medical treatment and regimen for other reason: Secondary | ICD-10-CM

## 2024-04-09 NOTE — Progress Notes (Signed)
 1. Failure to attend appointment with reason given    Recent hospitalization and discharge to SNF.

## 2024-05-03 ENCOUNTER — Encounter: Payer: Self-pay | Admitting: Podiatry

## 2024-05-03 ENCOUNTER — Ambulatory Visit: Admitting: Podiatry

## 2024-05-03 DIAGNOSIS — M79674 Pain in right toe(s): Secondary | ICD-10-CM

## 2024-05-03 DIAGNOSIS — B351 Tinea unguium: Secondary | ICD-10-CM

## 2024-05-03 DIAGNOSIS — M79675 Pain in left toe(s): Secondary | ICD-10-CM

## 2024-05-03 NOTE — Progress Notes (Signed)
 Subjective:  Patient ID: Jodi Colon, female    DOB: May 21, 1930,  MRN: [id number]  Jodi Colon presents to clinic today for painful mycotic toenails x 10 which interfere with daily activities. Pain is relieved with periodic professional debridement. She missed her last appointment due to being hospitalized. She is accompanied by her daughter on today's visit. Chief Complaint  Patient presents with   Nail Problem    RFC. Denies being diabetic. She states Dr. Tonnie is her PCP and she was in the office early last month.   New problem(s): None.   PCP is [name], [name], NP.  Allergies  Allergen Reactions   Penicillins Rash    Has patient had a PCN reaction causing immediate rash, facial/tongue/throat swelling, SOB or lightheadedness with hypotension: Yes Has patient had a PCN reaction causing severe rash involving mucus membranes or skin necrosis: No Has patient had a PCN reaction that required hospitalization: No Has patient had a PCN reaction occurring within the last 10 years: No If all of the above answers are NO, then may proceed with Cephalosporin use. **Occurred as an adult & full body rash    Sulfa Antibiotics Swelling and Rash    Facial swelling   Ciprofloxacin      Unknown reaction per MAR   Trospium      Other Reaction(s): Drops NA level to dangerous level   Sulfamethoxazole Rash   Sulfasalazine Swelling and Rash    swelling of face    Review of Systems: Negative except as noted in the HPI.  Objective:  There were no vitals filed for this visit. Jodi Colon is a pleasant 89 y.o. female thin build in NAD. AAO x 3.  Vascular Examination: DP palpable LLE. DP faintly palpable RLE. PT pulses faintly palpable b/l.  CFT immediate b/l. Pedal hair absent. No edema. No pain with calf compression b/l. Skin temperature gradient WNL b/l. No varicosities noted. No cyanosis or clubbing noted.  Neurological Examination: Sensation grossly intact b/l with 10 gram  monofilament. Vibratory sensation intact b/l.  Dermatological Examination: Pedal skin with normal turgor, texture and tone b/l. No open wounds nor interdigital macerations noted. Toenails 1-5 b/l thick, discolored, elongated with subungual debris and pain on dorsal palpation. No hyperkeratotic lesions noted b/l.   Musculoskeletal Examination: Muscle strength 5/5 to b/l LE.  No pain, crepitus noted b/l. No gross pedal deformities. Patient ambulates independently without assistive aids.   Radiographs: None  Assessment/Plan: 1. Pain due to onychomycosis of toenails of both feet    Consent given for treatment. Patient examined. All patient's and/or POA's questions/concerns addressed on today's visit. Mycotic toenails 1-5 b/l debrided in length and girth without incident. Continue soft, supportive shoe gear daily. Report any pedal injuries to medical professional. Call office if there are any quesitons/concerns. Patient/POA to call should there be question/concern in the interim.   Return in about 3 months (around 08/01/2024).  Jodi Colon, DPM      [facility] LOCATION: [address], [place] [address]                   Office [phone]   [facility] LOCATION: [address] [place], [place] [address] Office [phone]

## 2024-05-18 ENCOUNTER — Ambulatory Visit: Admitting: Physician Assistant

## 2024-05-18 ENCOUNTER — Emergency Department
Admission: EM | Admit: 2024-05-18 | Discharge: 2024-05-18 | Disposition: A | Discharge status: Home / Self Care | Source: Intra-hospital | Attending: Emergency Medicine | Admitting: Emergency Medicine

## 2024-05-18 ENCOUNTER — Other Ambulatory Visit: Payer: Self-pay

## 2024-05-18 DIAGNOSIS — N189 Chronic kidney disease, unspecified: Secondary | ICD-10-CM | POA: Diagnosis not present

## 2024-05-18 DIAGNOSIS — N390 Urinary tract infection, site not specified: Secondary | ICD-10-CM | POA: Insufficient documentation

## 2024-05-18 DIAGNOSIS — I129 Hypertensive chronic kidney disease with stage 1 through stage 4 chronic kidney disease, or unspecified chronic kidney disease: Secondary | ICD-10-CM | POA: Insufficient documentation

## 2024-05-18 DIAGNOSIS — Z8551 Personal history of malignant neoplasm of bladder: Secondary | ICD-10-CM | POA: Diagnosis not present

## 2024-05-18 DIAGNOSIS — Z8673 Personal history of transient ischemic attack (TIA), and cerebral infarction without residual deficits: Secondary | ICD-10-CM | POA: Diagnosis not present

## 2024-05-18 DIAGNOSIS — R300 Dysuria: Secondary | ICD-10-CM | POA: Diagnosis present

## 2024-05-18 LAB — CBC WITH DIFFERENTIAL/PLATELET
Abs Immature Granulocytes: 0.02 K/uL (ref 0.00–0.07)
Basophils Absolute: 0.1 K/uL (ref 0.0–0.1)
Basophils Relative: 1 %
Eosinophils Absolute: 0.5 K/uL (ref 0.0–0.5)
Eosinophils Relative: 7 %
HCT: 29.4 % — ABNORMAL LOW (ref 36.0–46.0)
Hemoglobin: 9 g/dL — ABNORMAL LOW (ref 12.0–15.0)
Immature Granulocytes: 0 %
Lymphocytes Relative: 36 %
Lymphs Abs: 2.5 K/uL (ref 0.7–4.0)
MCH: 25.1 pg — ABNORMAL LOW (ref 26.0–34.0)
MCHC: 30.6 g/dL (ref 30.0–36.0)
MCV: 81.9 fL (ref 80.0–100.0)
Monocytes Absolute: 0.7 K/uL (ref 0.1–1.0)
Monocytes Relative: 10 %
Neutro Abs: 3.2 K/uL (ref 1.7–7.7)
Neutrophils Relative %: 46 %
Platelets: 282 K/uL (ref 150–400)
RBC: 3.59 MIL/uL — ABNORMAL LOW (ref 3.87–5.11)
RDW: 14.5 % (ref 11.5–15.5)
WBC: 7 K/uL (ref 4.0–10.5)
nRBC: 0 % (ref 0.0–0.2)

## 2024-05-18 LAB — COMPREHENSIVE METABOLIC PANEL WITH GFR
ALT: 11 U/L (ref 0–44)
AST: 27 U/L (ref 15–41)
Albumin: 3.8 g/dL (ref 3.5–5.0)
Alkaline Phosphatase: 153 U/L — ABNORMAL HIGH (ref 38–126)
Anion gap: 9 (ref 5–15)
BUN: 18 mg/dL (ref 8–23)
CO2: 26 mmol/L (ref 22–32)
Calcium: 8.8 mg/dL — ABNORMAL LOW (ref 8.9–10.3)
Chloride: 99 mmol/L (ref 98–111)
Creatinine, Ser: 1.24 mg/dL — ABNORMAL HIGH (ref 0.44–1.00)
GFR, Estimated: 41 mL/min — ABNORMAL LOW
Glucose, Bld: 145 mg/dL — ABNORMAL HIGH (ref 70–99)
Potassium: 4.1 mmol/L (ref 3.5–5.1)
Sodium: 134 mmol/L — ABNORMAL LOW (ref 135–145)
Total Bilirubin: 0.3 mg/dL (ref 0.0–1.2)
Total Protein: 6.6 g/dL (ref 6.5–8.1)

## 2024-05-18 LAB — URINALYSIS, ROUTINE W REFLEX MICROSCOPIC
Bilirubin Urine: NEGATIVE
Glucose, UA: NEGATIVE mg/dL
Ketones, ur: NEGATIVE mg/dL
Nitrite: NEGATIVE
Protein, ur: NEGATIVE mg/dL
Specific Gravity, Urine: 1.008 (ref 1.005–1.030)
WBC, UA: >50 WBC/hpf (ref 0–5)
pH: 7 (ref 5.0–8.0)

## 2024-05-18 MED ORDER — CEFACLOR 250 MG PO CAPS: 250.0000 mg | ORAL_CAPSULE | Freq: Three times a day (TID) | ORAL | 0 refills | Status: DC

## 2024-05-18 MED ORDER — DIPHENHYDRAMINE HCL 50 MG/ML IJ SOLN
12.5000 mg | Freq: Once | INTRAMUSCULAR | Status: AC
  Administered 2024-05-18: 12.5 mg via INTRAVENOUS
  Filled 2024-05-18: qty 1

## 2024-05-18 MED ORDER — SODIUM CHLORIDE 0.9 % IV SOLN
1.0000 g | Freq: Once | INTRAVENOUS | Status: AC
  Administered 2024-05-18: 1 g via INTRAVENOUS
  Filled 2024-05-18: qty 10

## 2024-05-18 MED ORDER — CEFACLOR 250 MG PO CAPS: 250.0000 mg | ORAL_CAPSULE | Freq: Three times a day (TID) | ORAL | 0 refills | Status: AC

## 2024-05-18 MED ORDER — SODIUM CHLORIDE 0.9 % IV BOLUS
500.0000 mL | Freq: Once | INTRAVENOUS | Status: AC
  Administered 2024-05-18: 500 mL via INTRAVENOUS

## 2024-05-18 NOTE — Discharge Instructions (Signed)
 Follow-up with your regular doctor.  Ask her about a continuous antibiotic such as Macrobid  to prevent UTIs. Return emergency department if worsening. I did order a urine culture.  If you need a different antibiotic the staff will call you.

## 2024-05-18 NOTE — ED Notes (Signed)
 Pt family member inquiring if the patient can eat food. I let them know that when a provider comes in to speak with them to be sure to ask them, but that I advise to wait until they come in so no care is delayed.

## 2024-05-18 NOTE — ED Triage Notes (Signed)
 Pt states, I feel like I have a UTI. I can't void. Pt has bladder cancer and usually has urinary frequency. The last time she urinated was overnight when she wet the bed.

## 2024-05-18 NOTE — ED Provider Notes (Signed)
 "  [facility] Provider Note    Event Date/Time   First MD Initiated Contact with Patient 05/18/24 1631     (approximate)   History   Urinary Retention   HPI  Jodi SZYMANOWSKI is a 89 y.o. female history of bladder cancer, CKD, CVA, hypertension presents emergency department with difficulty urinating.  Patient states she was not urinating much yesterday.  Is leaking a lot today.  States she does leak a lot and does urinate also.  Has multiple UTIs.      Physical Exam   Triage Vital Signs: ED Triage Vitals  Encounter Vitals Group     BP 05/18/24 1345 (!) 145/58     Girls Systolic BP Percentile --      Girls Diastolic BP Percentile --      Boys Systolic BP Percentile --      Boys Diastolic BP Percentile --      Pulse Rate 05/18/24 1345 98     Resp 05/18/24 1345 17     Temp 05/18/24 1343 97.7 F (36.5 C)     Temp Source 05/18/24 1343 Oral     SpO2 05/18/24 1345 98 %     Weight 05/18/24 1344 105 lb (47.6 kg)     Height 05/18/24 1344 4' 9 (1.448 m)     Head Circumference --      Peak Flow --      Pain Score 05/18/24 1344 5     Pain Loc --      Pain Education --      Exclude from Growth Chart --     Most recent vital signs: Vitals:   05/18/24 1500 05/18/24 1825  BP:  134/67  Pulse:  88  Resp:  18  Temp:    SpO2: 100% 100%     General: Awake, no distress.   CV:  Good peripheral perfusion.  Resp:  Normal effort. Abd:  No distention.  Nontender, no CVA tenderness Other:      ED Results / Procedures / Treatments   Labs (all labs ordered are listed, but only abnormal results are displayed) Labs Reviewed  CBC WITH DIFFERENTIAL/PLATELET - Abnormal; Notable for the following components:      Result Value   RBC 3.59 (*)    Hemoglobin 9.0 (*)    HCT 29.4 (*)    MCH 25.1 (*)    All other components within normal limits  COMPREHENSIVE METABOLIC PANEL WITH GFR - Abnormal; Notable for the following components:   Sodium 134 (*)    Glucose,  Bld 145 (*)    Creatinine, Ser 1.24 (*)    Calcium  8.8 (*)    Alkaline Phosphatase 153 (*)    GFR, Estimated 41 (*)    All other components within normal limits  URINALYSIS, ROUTINE W REFLEX MICROSCOPIC - Abnormal; Notable for the following components:   Color, Urine YELLOW (*)    APPearance CLOUDY (*)    Hgb urine dipstick MODERATE (*)    Leukocytes,Ua LARGE (*)    Bacteria, UA RARE (*)    Non Squamous Epithelial PRESENT (*)    All other components within normal limits  URINE CULTURE     EKG     RADIOLOGY     PROCEDURES:   Procedures  Critical Care:  no Chief Complaint  Patient presents with   Urinary Retention      MEDICATIONS ORDERED IN ED: Medications  sodium chloride  0.9 % bolus 500 mL (0 mLs Intravenous  Stopped 05/18/24 1822)  cefTRIAXone  (ROCEPHIN ) 1 g in sodium chloride  0.9 % 100 mL IVPB (0 g Intravenous Stopped 05/18/24 1804)  diphenhydrAMINE  (BENADRYL ) injection 12.5 mg (12.5 mg Intravenous Given 05/18/24 1735)     IMPRESSION / MDM / ASSESSMENT AND PLAN / ED COURSE  I reviewed the triage vital signs and the nursing notes.                              Differential diagnosis includes, but is not limited to, recurrent UTI, acute UTI, urinary retention, cord compression  Patient's presentation is most consistent with acute illness / injury with system symptoms.   Medications given: Normal saline 1 L IV, Rocephin  1 g IV  Labs reassuring except for the urine shows large amount of leuks with white blood cell clumps present indicating infection.  Will do urine culture to assess for etiology.  Did explain the findings to patient and her daughter.  She has had multiple UTIs since been treated with Keflex so do feel that Rocephin  would be safe for her to cover more of an upper or resistant UTI.  Also place her on Ceclor to treat resistant UTI.  This should help penetrate the bacteria better.  She should also discuss being on a continuous antibiotic with  her doctor.  She did ask me to do that but I do not feel comfortable doing that from the ED.  This primary care decision.  She is in agreement this treatment plan.  No red flags to warrant any further workup.  Discharged stable condition.      FINAL CLINICAL IMPRESSION(S) / ED DIAGNOSES   Final diagnoses:  Acute UTI     Rx / DC Orders   ED Discharge Orders          Ordered    cefaclor (CECLOR) 250 MG capsule  3 times daily,   Status:  Discontinued        05/18/24 1805    cefaclor (CECLOR) 250 MG capsule  3 times daily        05/18/24 1826             Note:  This document was prepared using Dragon voice recognition software and may include unintentional dictation errors.    Gasper Devere ORN, PA-C 05/18/24 CELESTER Dicky Anes, MD 05/18/24 2117  "

## 2024-05-18 NOTE — ED Notes (Signed)
Bladder scan showed 18ml

## 2024-05-20 LAB — URINE CULTURE: Culture: >=100000 — AB

## 2024-08-02 ENCOUNTER — Ambulatory Visit: Admitting: Podiatry

## 2025-02-15 ENCOUNTER — Ambulatory Visit: Admitting: Urology
